# Patient Record
Sex: Female | Born: 1950 | Race: White | Hispanic: No | Smoking: Current every day smoker
Health system: Southern US, Community
[De-identification: ages and names within clinical notes are randomized; demographics above are authoritative.]

## PROBLEM LIST (undated history)

## (undated) DIAGNOSIS — I059 Rheumatic mitral valve disease, unspecified: Secondary | ICD-10-CM

## (undated) DIAGNOSIS — F329 Major depressive disorder, single episode, unspecified: Secondary | ICD-10-CM

## (undated) DIAGNOSIS — K2100 Gastro-esophageal reflux disease with esophagitis, without bleeding: Secondary | ICD-10-CM

## (undated) DIAGNOSIS — G894 Chronic pain syndrome: Secondary | ICD-10-CM

## (undated) DIAGNOSIS — I2510 Atherosclerotic heart disease of native coronary artery without angina pectoris: Secondary | ICD-10-CM

## (undated) DIAGNOSIS — F32A Depression, unspecified: Secondary | ICD-10-CM

## (undated) DIAGNOSIS — N181 Chronic kidney disease, stage 1: Secondary | ICD-10-CM

## (undated) DIAGNOSIS — M810 Age-related osteoporosis without current pathological fracture: Secondary | ICD-10-CM

## (undated) DIAGNOSIS — M5412 Radiculopathy, cervical region: Secondary | ICD-10-CM

## (undated) DIAGNOSIS — I509 Heart failure, unspecified: Secondary | ICD-10-CM

## (undated) DIAGNOSIS — M5030 Other cervical disc degeneration, unspecified cervical region: Secondary | ICD-10-CM

## (undated) DIAGNOSIS — L0291 Cutaneous abscess, unspecified: Secondary | ICD-10-CM

## (undated) DIAGNOSIS — I10 Essential (primary) hypertension: Secondary | ICD-10-CM

## (undated) DIAGNOSIS — IMO0002 Reserved for concepts with insufficient information to code with codable children: Secondary | ICD-10-CM

## (undated) DIAGNOSIS — G43909 Migraine, unspecified, not intractable, without status migrainosus: Secondary | ICD-10-CM

## (undated) DIAGNOSIS — E7800 Pure hypercholesterolemia, unspecified: Secondary | ICD-10-CM

## (undated) DIAGNOSIS — R32 Unspecified urinary incontinence: Secondary | ICD-10-CM

## (undated) DIAGNOSIS — L0390 Cellulitis, unspecified: Secondary | ICD-10-CM

## (undated) DIAGNOSIS — J301 Allergic rhinitis due to pollen: Secondary | ICD-10-CM

## (undated) DIAGNOSIS — F419 Anxiety disorder, unspecified: Secondary | ICD-10-CM

## (undated) HISTORY — PX: ABDOMINAL HYSTERECTOMY: SHX81

## (undated) HISTORY — DX: Cellulitis, unspecified: L03.90

## (undated) HISTORY — PX: BYPASS GRAFT: SHX909

## (undated) HISTORY — DX: Atherosclerotic heart disease of native coronary artery without angina pectoris: I25.10

## (undated) HISTORY — DX: Unspecified urinary incontinence: R32

## (undated) HISTORY — DX: Gastro-esophageal reflux disease with esophagitis, without bleeding: K21.00

## (undated) HISTORY — DX: Gastro-esophageal reflux disease with esophagitis: K21.0

## (undated) HISTORY — DX: Cutaneous abscess, unspecified: L02.91

## (undated) HISTORY — DX: Radiculopathy, cervical region: M54.12

## (undated) HISTORY — DX: Rheumatic mitral valve disease, unspecified: I05.9

## (undated) HISTORY — PX: BLADDER SURGERY: SHX569

## (undated) HISTORY — DX: Chronic kidney disease, stage 1: N18.1

## (undated) HISTORY — DX: Age-related osteoporosis without current pathological fracture: M81.0

## (undated) HISTORY — DX: Allergic rhinitis due to pollen: J30.1

## (undated) HISTORY — DX: Migraine, unspecified, not intractable, without status migrainosus: G43.909

## (undated) HISTORY — DX: Reserved for concepts with insufficient information to code with codable children: IMO0002

## (undated) HISTORY — PX: COLONOSCOPY: SHX174

## (undated) HISTORY — PX: CORONARY ARTERY BYPASS GRAFT: SHX141

## (undated) HISTORY — DX: Chronic pain syndrome: G89.4

## (undated) HISTORY — DX: Pure hypercholesterolemia, unspecified: E78.00

## (undated) HISTORY — DX: Essential (primary) hypertension: I10

## (undated) HISTORY — PX: OTHER SURGICAL HISTORY: SHX169

## (undated) HISTORY — DX: Heart failure, unspecified: I50.9

## (undated) HISTORY — DX: Other cervical disc degeneration, unspecified cervical region: M50.30

## (undated) HISTORY — PX: CHOLECYSTECTOMY: SHX55

---

## 2004-07-11 ENCOUNTER — Other Ambulatory Visit: Payer: Self-pay

## 2004-07-11 ENCOUNTER — Inpatient Hospital Stay: Payer: Self-pay | Admitting: Internal Medicine

## 2005-07-05 ENCOUNTER — Encounter: Payer: Self-pay | Admitting: Family Medicine

## 2005-08-01 ENCOUNTER — Ambulatory Visit: Payer: Self-pay | Admitting: Family Medicine

## 2006-10-30 ENCOUNTER — Emergency Department: Payer: Self-pay | Admitting: Unknown Physician Specialty

## 2007-05-22 DIAGNOSIS — I2510 Atherosclerotic heart disease of native coronary artery without angina pectoris: Secondary | ICD-10-CM | POA: Insufficient documentation

## 2007-09-20 ENCOUNTER — Emergency Department: Payer: Self-pay | Admitting: Emergency Medicine

## 2009-02-19 ENCOUNTER — Ambulatory Visit: Payer: Self-pay | Admitting: Family Medicine

## 2009-03-13 ENCOUNTER — Inpatient Hospital Stay: Payer: Self-pay | Admitting: Internal Medicine

## 2009-12-03 ENCOUNTER — Ambulatory Visit: Payer: Self-pay | Admitting: Pain Medicine

## 2010-09-22 ENCOUNTER — Inpatient Hospital Stay: Payer: Medicare Other | Admitting: Surgery

## 2010-10-11 ENCOUNTER — Emergency Department (HOSPITAL_COMMUNITY)
Admission: EM | Admit: 2010-10-11 | Discharge: 2010-10-12 | Payer: Self-pay | Source: Home / Self Care | Admitting: Emergency Medicine

## 2010-10-11 LAB — POCT I-STAT, CHEM 8
Calcium, Ion: 1.15 mmol/L (ref 1.12–1.32)
Creatinine, Ser: 1.4 mg/dL — ABNORMAL HIGH (ref 0.4–1.2)
Glucose, Bld: 102 mg/dL — ABNORMAL HIGH (ref 70–99)
Hemoglobin: 8.8 g/dL — ABNORMAL LOW (ref 12.0–15.0)
Sodium: 141 mEq/L (ref 135–145)
TCO2: 30 mmol/L (ref 0–100)

## 2010-10-11 LAB — DIFFERENTIAL
Basophils Relative: 0 % (ref 0–1)
Lymphs Abs: 2.6 10*3/uL (ref 0.7–4.0)
Monocytes Absolute: 1 10*3/uL (ref 0.1–1.0)
Monocytes Relative: 9 % (ref 3–12)
Neutro Abs: 6.8 10*3/uL (ref 1.7–7.7)

## 2010-10-11 LAB — CBC
Hemoglobin: 9 g/dL — ABNORMAL LOW (ref 12.0–15.0)
MCH: 32.6 pg (ref 26.0–34.0)
MCHC: 33.3 g/dL (ref 30.0–36.0)
MCV: 97.8 fL (ref 78.0–100.0)

## 2010-10-15 ENCOUNTER — Ambulatory Visit: Payer: Self-pay | Admitting: Geriatric Medicine

## 2010-10-16 ENCOUNTER — Other Ambulatory Visit: Payer: Self-pay | Admitting: Geriatric Medicine

## 2010-11-03 ENCOUNTER — Ambulatory Visit: Payer: Self-pay | Admitting: Surgery

## 2010-12-15 ENCOUNTER — Encounter: Payer: Self-pay | Admitting: Surgery

## 2011-01-11 ENCOUNTER — Encounter: Payer: Self-pay | Admitting: Surgery

## 2011-12-20 DIAGNOSIS — G894 Chronic pain syndrome: Secondary | ICD-10-CM | POA: Diagnosis not present

## 2011-12-20 DIAGNOSIS — I10 Essential (primary) hypertension: Secondary | ICD-10-CM | POA: Diagnosis not present

## 2011-12-20 DIAGNOSIS — E7800 Pure hypercholesterolemia, unspecified: Secondary | ICD-10-CM | POA: Diagnosis not present

## 2011-12-20 DIAGNOSIS — F329 Major depressive disorder, single episode, unspecified: Secondary | ICD-10-CM | POA: Diagnosis not present

## 2012-03-20 DIAGNOSIS — I10 Essential (primary) hypertension: Secondary | ICD-10-CM | POA: Diagnosis not present

## 2012-03-20 DIAGNOSIS — I2510 Atherosclerotic heart disease of native coronary artery without angina pectoris: Secondary | ICD-10-CM | POA: Diagnosis not present

## 2012-03-20 DIAGNOSIS — F329 Major depressive disorder, single episode, unspecified: Secondary | ICD-10-CM | POA: Diagnosis not present

## 2012-03-20 DIAGNOSIS — G894 Chronic pain syndrome: Secondary | ICD-10-CM | POA: Diagnosis not present

## 2012-05-21 DIAGNOSIS — G894 Chronic pain syndrome: Secondary | ICD-10-CM | POA: Diagnosis not present

## 2012-05-21 DIAGNOSIS — F329 Major depressive disorder, single episode, unspecified: Secondary | ICD-10-CM | POA: Diagnosis not present

## 2012-05-21 DIAGNOSIS — I509 Heart failure, unspecified: Secondary | ICD-10-CM | POA: Diagnosis not present

## 2012-05-21 DIAGNOSIS — Z23 Encounter for immunization: Secondary | ICD-10-CM | POA: Diagnosis not present

## 2012-05-22 ENCOUNTER — Inpatient Hospital Stay: Payer: Self-pay | Admitting: Orthopedic Surgery

## 2012-05-22 DIAGNOSIS — Z01818 Encounter for other preprocedural examination: Secondary | ICD-10-CM | POA: Diagnosis not present

## 2012-05-22 DIAGNOSIS — I428 Other cardiomyopathies: Secondary | ICD-10-CM | POA: Diagnosis not present

## 2012-05-22 DIAGNOSIS — M542 Cervicalgia: Secondary | ICD-10-CM | POA: Diagnosis not present

## 2012-05-22 DIAGNOSIS — M25569 Pain in unspecified knee: Secondary | ICD-10-CM | POA: Diagnosis not present

## 2012-05-22 DIAGNOSIS — I2510 Atherosclerotic heart disease of native coronary artery without angina pectoris: Secondary | ICD-10-CM | POA: Diagnosis not present

## 2012-05-22 DIAGNOSIS — S139XXA Sprain of joints and ligaments of unspecified parts of neck, initial encounter: Secondary | ICD-10-CM | POA: Diagnosis not present

## 2012-05-22 DIAGNOSIS — I10 Essential (primary) hypertension: Secondary | ICD-10-CM | POA: Diagnosis not present

## 2012-05-22 DIAGNOSIS — S060X0A Concussion without loss of consciousness, initial encounter: Secondary | ICD-10-CM | POA: Diagnosis not present

## 2012-05-22 DIAGNOSIS — M545 Low back pain: Secondary | ICD-10-CM | POA: Diagnosis not present

## 2012-05-22 DIAGNOSIS — S199XXA Unspecified injury of neck, initial encounter: Secondary | ICD-10-CM | POA: Diagnosis not present

## 2012-05-22 DIAGNOSIS — Z0389 Encounter for observation for other suspected diseases and conditions ruled out: Secondary | ICD-10-CM | POA: Diagnosis not present

## 2012-05-22 DIAGNOSIS — S72033A Displaced midcervical fracture of unspecified femur, initial encounter for closed fracture: Secondary | ICD-10-CM | POA: Diagnosis not present

## 2012-05-22 DIAGNOSIS — S72009A Fracture of unspecified part of neck of unspecified femur, initial encounter for closed fracture: Secondary | ICD-10-CM | POA: Diagnosis not present

## 2012-05-22 LAB — COMPREHENSIVE METABOLIC PANEL
Alkaline Phosphatase: 113 U/L (ref 50–136)
Anion Gap: 10 (ref 7–16)
Calcium, Total: 9.1 mg/dL (ref 8.5–10.1)
Co2: 26 mmol/L (ref 21–32)
Creatinine: 0.93 mg/dL (ref 0.60–1.30)
EGFR (African American): >60
Osmolality: 277 (ref 275–301)
SGOT(AST): 38 U/L — ABNORMAL HIGH (ref 15–37)
SGPT (ALT): 18 U/L (ref 12–78)

## 2012-05-22 LAB — URINALYSIS, COMPLETE
Bilirubin,UR: NEGATIVE
Glucose,UR: NEGATIVE mg/dL (ref 0–75)
Ph: 6 (ref 4.5–8.0)
Protein: NEGATIVE
RBC,UR: 3 /HPF (ref 0–5)
Squamous Epithelial: 1

## 2012-05-22 LAB — CBC
HGB: 13 g/dL (ref 12.0–16.0)
MCH: 33.4 pg (ref 26.0–34.0)
Platelet: 177 10*3/uL (ref 150–440)
RBC: 3.9 10*6/uL (ref 3.80–5.20)

## 2012-05-22 LAB — APTT: Activated PTT: 31.5 secs (ref 23.6–35.9)

## 2012-05-22 LAB — PROTIME-INR
INR: 0.9
Prothrombin Time: 12.8 secs (ref 11.5–14.7)

## 2012-05-23 DIAGNOSIS — I428 Other cardiomyopathies: Secondary | ICD-10-CM | POA: Diagnosis not present

## 2012-05-23 DIAGNOSIS — S72023A Displaced fracture of epiphysis (separation) (upper) of unspecified femur, initial encounter for closed fracture: Secondary | ICD-10-CM | POA: Diagnosis not present

## 2012-05-23 DIAGNOSIS — S72033A Displaced midcervical fracture of unspecified femur, initial encounter for closed fracture: Secondary | ICD-10-CM | POA: Diagnosis not present

## 2012-05-23 DIAGNOSIS — I2510 Atherosclerotic heart disease of native coronary artery without angina pectoris: Secondary | ICD-10-CM | POA: Diagnosis not present

## 2012-05-23 DIAGNOSIS — I10 Essential (primary) hypertension: Secondary | ICD-10-CM | POA: Diagnosis not present

## 2012-05-23 DIAGNOSIS — S72009A Fracture of unspecified part of neck of unspecified femur, initial encounter for closed fracture: Secondary | ICD-10-CM | POA: Diagnosis not present

## 2012-05-23 DIAGNOSIS — M84453A Pathological fracture, unspecified femur, initial encounter for fracture: Secondary | ICD-10-CM | POA: Diagnosis not present

## 2012-05-24 DIAGNOSIS — M84453A Pathological fracture, unspecified femur, initial encounter for fracture: Secondary | ICD-10-CM | POA: Diagnosis not present

## 2012-05-24 DIAGNOSIS — I10 Essential (primary) hypertension: Secondary | ICD-10-CM | POA: Diagnosis not present

## 2012-05-24 DIAGNOSIS — I2510 Atherosclerotic heart disease of native coronary artery without angina pectoris: Secondary | ICD-10-CM | POA: Diagnosis not present

## 2012-05-24 DIAGNOSIS — I428 Other cardiomyopathies: Secondary | ICD-10-CM | POA: Diagnosis not present

## 2012-05-25 DIAGNOSIS — M84453A Pathological fracture, unspecified femur, initial encounter for fracture: Secondary | ICD-10-CM | POA: Diagnosis not present

## 2012-05-25 DIAGNOSIS — I428 Other cardiomyopathies: Secondary | ICD-10-CM | POA: Diagnosis not present

## 2012-05-25 DIAGNOSIS — I2510 Atherosclerotic heart disease of native coronary artery without angina pectoris: Secondary | ICD-10-CM | POA: Diagnosis not present

## 2012-05-25 DIAGNOSIS — I10 Essential (primary) hypertension: Secondary | ICD-10-CM | POA: Diagnosis not present

## 2012-05-25 LAB — URINALYSIS, COMPLETE
Nitrite: NEGATIVE
Ph: 7 (ref 4.5–8.0)
Protein: NEGATIVE
Specific Gravity: 1.002 (ref 1.003–1.030)
WBC UR: 3 /HPF (ref 0–5)

## 2012-05-26 LAB — CREATININE, SERUM
Creatinine: 0.85 mg/dL (ref 0.60–1.30)
EGFR (African American): >60
EGFR (Non-African Amer.): >60

## 2012-05-26 LAB — URINE CULTURE

## 2012-05-28 DIAGNOSIS — I2510 Atherosclerotic heart disease of native coronary artery without angina pectoris: Secondary | ICD-10-CM | POA: Diagnosis not present

## 2012-05-28 DIAGNOSIS — IMO0001 Reserved for inherently not codable concepts without codable children: Secondary | ICD-10-CM | POA: Diagnosis not present

## 2012-05-28 DIAGNOSIS — M6281 Muscle weakness (generalized): Secondary | ICD-10-CM | POA: Diagnosis not present

## 2012-05-28 DIAGNOSIS — I10 Essential (primary) hypertension: Secondary | ICD-10-CM | POA: Diagnosis not present

## 2012-05-28 DIAGNOSIS — S7290XD Unspecified fracture of unspecified femur, subsequent encounter for closed fracture with routine healing: Secondary | ICD-10-CM | POA: Diagnosis not present

## 2012-05-28 DIAGNOSIS — F329 Major depressive disorder, single episode, unspecified: Secondary | ICD-10-CM | POA: Diagnosis not present

## 2012-05-29 DIAGNOSIS — I2510 Atherosclerotic heart disease of native coronary artery without angina pectoris: Secondary | ICD-10-CM | POA: Diagnosis not present

## 2012-05-29 DIAGNOSIS — I10 Essential (primary) hypertension: Secondary | ICD-10-CM | POA: Diagnosis not present

## 2012-05-29 DIAGNOSIS — F329 Major depressive disorder, single episode, unspecified: Secondary | ICD-10-CM | POA: Diagnosis not present

## 2012-05-29 DIAGNOSIS — M6281 Muscle weakness (generalized): Secondary | ICD-10-CM | POA: Diagnosis not present

## 2012-05-29 DIAGNOSIS — S7290XD Unspecified fracture of unspecified femur, subsequent encounter for closed fracture with routine healing: Secondary | ICD-10-CM | POA: Diagnosis not present

## 2012-05-29 DIAGNOSIS — IMO0001 Reserved for inherently not codable concepts without codable children: Secondary | ICD-10-CM | POA: Diagnosis not present

## 2012-06-01 DIAGNOSIS — S7290XD Unspecified fracture of unspecified femur, subsequent encounter for closed fracture with routine healing: Secondary | ICD-10-CM | POA: Diagnosis not present

## 2012-06-01 DIAGNOSIS — IMO0001 Reserved for inherently not codable concepts without codable children: Secondary | ICD-10-CM | POA: Diagnosis not present

## 2012-06-01 DIAGNOSIS — I2510 Atherosclerotic heart disease of native coronary artery without angina pectoris: Secondary | ICD-10-CM | POA: Diagnosis not present

## 2012-06-01 DIAGNOSIS — I10 Essential (primary) hypertension: Secondary | ICD-10-CM | POA: Diagnosis not present

## 2012-06-01 DIAGNOSIS — F329 Major depressive disorder, single episode, unspecified: Secondary | ICD-10-CM | POA: Diagnosis not present

## 2012-06-01 DIAGNOSIS — M6281 Muscle weakness (generalized): Secondary | ICD-10-CM | POA: Diagnosis not present

## 2012-06-04 DIAGNOSIS — M6281 Muscle weakness (generalized): Secondary | ICD-10-CM | POA: Diagnosis not present

## 2012-06-04 DIAGNOSIS — I10 Essential (primary) hypertension: Secondary | ICD-10-CM | POA: Diagnosis not present

## 2012-06-04 DIAGNOSIS — IMO0001 Reserved for inherently not codable concepts without codable children: Secondary | ICD-10-CM | POA: Diagnosis not present

## 2012-06-04 DIAGNOSIS — F329 Major depressive disorder, single episode, unspecified: Secondary | ICD-10-CM | POA: Diagnosis not present

## 2012-06-04 DIAGNOSIS — I2510 Atherosclerotic heart disease of native coronary artery without angina pectoris: Secondary | ICD-10-CM | POA: Diagnosis not present

## 2012-06-04 DIAGNOSIS — S7290XD Unspecified fracture of unspecified femur, subsequent encounter for closed fracture with routine healing: Secondary | ICD-10-CM | POA: Diagnosis not present

## 2012-06-06 DIAGNOSIS — I10 Essential (primary) hypertension: Secondary | ICD-10-CM | POA: Diagnosis not present

## 2012-06-06 DIAGNOSIS — M6281 Muscle weakness (generalized): Secondary | ICD-10-CM | POA: Diagnosis not present

## 2012-06-06 DIAGNOSIS — F329 Major depressive disorder, single episode, unspecified: Secondary | ICD-10-CM | POA: Diagnosis not present

## 2012-06-06 DIAGNOSIS — I2510 Atherosclerotic heart disease of native coronary artery without angina pectoris: Secondary | ICD-10-CM | POA: Diagnosis not present

## 2012-06-06 DIAGNOSIS — IMO0001 Reserved for inherently not codable concepts without codable children: Secondary | ICD-10-CM | POA: Diagnosis not present

## 2012-06-06 DIAGNOSIS — S7290XD Unspecified fracture of unspecified femur, subsequent encounter for closed fracture with routine healing: Secondary | ICD-10-CM | POA: Diagnosis not present

## 2012-06-08 DIAGNOSIS — S7290XD Unspecified fracture of unspecified femur, subsequent encounter for closed fracture with routine healing: Secondary | ICD-10-CM | POA: Diagnosis not present

## 2012-06-08 DIAGNOSIS — I10 Essential (primary) hypertension: Secondary | ICD-10-CM | POA: Diagnosis not present

## 2012-06-08 DIAGNOSIS — I2510 Atherosclerotic heart disease of native coronary artery without angina pectoris: Secondary | ICD-10-CM | POA: Diagnosis not present

## 2012-06-08 DIAGNOSIS — IMO0001 Reserved for inherently not codable concepts without codable children: Secondary | ICD-10-CM | POA: Diagnosis not present

## 2012-06-08 DIAGNOSIS — F329 Major depressive disorder, single episode, unspecified: Secondary | ICD-10-CM | POA: Diagnosis not present

## 2012-06-08 DIAGNOSIS — M6281 Muscle weakness (generalized): Secondary | ICD-10-CM | POA: Diagnosis not present

## 2012-06-09 DIAGNOSIS — F329 Major depressive disorder, single episode, unspecified: Secondary | ICD-10-CM | POA: Diagnosis not present

## 2012-06-09 DIAGNOSIS — I10 Essential (primary) hypertension: Secondary | ICD-10-CM | POA: Diagnosis not present

## 2012-06-09 DIAGNOSIS — IMO0001 Reserved for inherently not codable concepts without codable children: Secondary | ICD-10-CM | POA: Diagnosis not present

## 2012-06-09 DIAGNOSIS — S7290XD Unspecified fracture of unspecified femur, subsequent encounter for closed fracture with routine healing: Secondary | ICD-10-CM | POA: Diagnosis not present

## 2012-06-09 DIAGNOSIS — I2510 Atherosclerotic heart disease of native coronary artery without angina pectoris: Secondary | ICD-10-CM | POA: Diagnosis not present

## 2012-06-09 DIAGNOSIS — M6281 Muscle weakness (generalized): Secondary | ICD-10-CM | POA: Diagnosis not present

## 2012-06-11 DIAGNOSIS — I2510 Atherosclerotic heart disease of native coronary artery without angina pectoris: Secondary | ICD-10-CM | POA: Diagnosis not present

## 2012-06-11 DIAGNOSIS — S7290XD Unspecified fracture of unspecified femur, subsequent encounter for closed fracture with routine healing: Secondary | ICD-10-CM | POA: Diagnosis not present

## 2012-06-11 DIAGNOSIS — IMO0001 Reserved for inherently not codable concepts without codable children: Secondary | ICD-10-CM | POA: Diagnosis not present

## 2012-06-11 DIAGNOSIS — I10 Essential (primary) hypertension: Secondary | ICD-10-CM | POA: Diagnosis not present

## 2012-06-11 DIAGNOSIS — M6281 Muscle weakness (generalized): Secondary | ICD-10-CM | POA: Diagnosis not present

## 2012-06-11 DIAGNOSIS — F329 Major depressive disorder, single episode, unspecified: Secondary | ICD-10-CM | POA: Diagnosis not present

## 2012-06-18 DIAGNOSIS — I2510 Atherosclerotic heart disease of native coronary artery without angina pectoris: Secondary | ICD-10-CM | POA: Diagnosis not present

## 2012-06-18 DIAGNOSIS — F329 Major depressive disorder, single episode, unspecified: Secondary | ICD-10-CM | POA: Diagnosis not present

## 2012-06-18 DIAGNOSIS — I10 Essential (primary) hypertension: Secondary | ICD-10-CM | POA: Diagnosis not present

## 2012-06-18 DIAGNOSIS — S7290XD Unspecified fracture of unspecified femur, subsequent encounter for closed fracture with routine healing: Secondary | ICD-10-CM | POA: Diagnosis not present

## 2012-06-18 DIAGNOSIS — M6281 Muscle weakness (generalized): Secondary | ICD-10-CM | POA: Diagnosis not present

## 2012-06-18 DIAGNOSIS — IMO0001 Reserved for inherently not codable concepts without codable children: Secondary | ICD-10-CM | POA: Diagnosis not present

## 2012-07-18 DIAGNOSIS — I10 Essential (primary) hypertension: Secondary | ICD-10-CM | POA: Diagnosis not present

## 2012-07-18 DIAGNOSIS — IMO0001 Reserved for inherently not codable concepts without codable children: Secondary | ICD-10-CM | POA: Diagnosis not present

## 2012-07-18 DIAGNOSIS — I2510 Atherosclerotic heart disease of native coronary artery without angina pectoris: Secondary | ICD-10-CM | POA: Diagnosis not present

## 2012-07-18 DIAGNOSIS — S7290XD Unspecified fracture of unspecified femur, subsequent encounter for closed fracture with routine healing: Secondary | ICD-10-CM | POA: Diagnosis not present

## 2012-07-20 DIAGNOSIS — Z23 Encounter for immunization: Secondary | ICD-10-CM | POA: Diagnosis not present

## 2012-07-20 DIAGNOSIS — G894 Chronic pain syndrome: Secondary | ICD-10-CM | POA: Diagnosis not present

## 2012-07-20 DIAGNOSIS — E7800 Pure hypercholesterolemia, unspecified: Secondary | ICD-10-CM | POA: Diagnosis not present

## 2012-07-20 DIAGNOSIS — F329 Major depressive disorder, single episode, unspecified: Secondary | ICD-10-CM | POA: Diagnosis not present

## 2012-08-16 DIAGNOSIS — E7800 Pure hypercholesterolemia, unspecified: Secondary | ICD-10-CM | POA: Diagnosis not present

## 2012-09-12 HISTORY — PX: CARDIAC CATHETERIZATION: SHX172

## 2012-09-20 DIAGNOSIS — I10 Essential (primary) hypertension: Secondary | ICD-10-CM | POA: Diagnosis not present

## 2012-09-20 DIAGNOSIS — Z1239 Encounter for other screening for malignant neoplasm of breast: Secondary | ICD-10-CM | POA: Diagnosis not present

## 2012-09-20 DIAGNOSIS — G894 Chronic pain syndrome: Secondary | ICD-10-CM | POA: Diagnosis not present

## 2012-09-20 DIAGNOSIS — I509 Heart failure, unspecified: Secondary | ICD-10-CM | POA: Diagnosis not present

## 2012-12-14 DIAGNOSIS — R6889 Other general symptoms and signs: Secondary | ICD-10-CM | POA: Diagnosis not present

## 2012-12-15 ENCOUNTER — Inpatient Hospital Stay: Payer: Self-pay | Admitting: Internal Medicine

## 2012-12-15 DIAGNOSIS — D72829 Elevated white blood cell count, unspecified: Secondary | ICD-10-CM | POA: Diagnosis present

## 2012-12-15 DIAGNOSIS — I10 Essential (primary) hypertension: Secondary | ICD-10-CM | POA: Diagnosis not present

## 2012-12-15 DIAGNOSIS — J189 Pneumonia, unspecified organism: Secondary | ICD-10-CM | POA: Diagnosis not present

## 2012-12-15 DIAGNOSIS — K219 Gastro-esophageal reflux disease without esophagitis: Secondary | ICD-10-CM | POA: Diagnosis present

## 2012-12-15 DIAGNOSIS — R079 Chest pain, unspecified: Secondary | ICD-10-CM | POA: Diagnosis not present

## 2012-12-15 DIAGNOSIS — I2510 Atherosclerotic heart disease of native coronary artery without angina pectoris: Secondary | ICD-10-CM | POA: Diagnosis not present

## 2012-12-15 DIAGNOSIS — G43909 Migraine, unspecified, not intractable, without status migrainosus: Secondary | ICD-10-CM | POA: Diagnosis present

## 2012-12-15 DIAGNOSIS — F17200 Nicotine dependence, unspecified, uncomplicated: Secondary | ICD-10-CM | POA: Diagnosis present

## 2012-12-15 DIAGNOSIS — R011 Cardiac murmur, unspecified: Secondary | ICD-10-CM | POA: Diagnosis present

## 2012-12-15 DIAGNOSIS — R609 Edema, unspecified: Secondary | ICD-10-CM | POA: Diagnosis present

## 2012-12-15 DIAGNOSIS — Z7982 Long term (current) use of aspirin: Secondary | ICD-10-CM | POA: Diagnosis not present

## 2012-12-15 DIAGNOSIS — R918 Other nonspecific abnormal finding of lung field: Secondary | ICD-10-CM | POA: Diagnosis not present

## 2012-12-15 DIAGNOSIS — E876 Hypokalemia: Secondary | ICD-10-CM | POA: Diagnosis not present

## 2012-12-15 DIAGNOSIS — Z951 Presence of aortocoronary bypass graft: Secondary | ICD-10-CM | POA: Diagnosis not present

## 2012-12-15 DIAGNOSIS — F329 Major depressive disorder, single episode, unspecified: Secondary | ICD-10-CM | POA: Diagnosis present

## 2012-12-15 DIAGNOSIS — I428 Other cardiomyopathies: Secondary | ICD-10-CM | POA: Diagnosis present

## 2012-12-15 DIAGNOSIS — R0602 Shortness of breath: Secondary | ICD-10-CM | POA: Diagnosis not present

## 2012-12-15 LAB — CBC WITH DIFFERENTIAL/PLATELET
HCT: 35.5 % (ref 35.0–47.0)
HGB: 12 g/dL (ref 12.0–16.0)
Lymphocyte #: 1.4 10*3/uL (ref 1.0–3.6)
Lymphocyte %: 7.9 %
MCH: 32.2 pg (ref 26.0–34.0)
Monocyte #: 0.9 x10 3/mm (ref 0.2–0.9)
Monocyte %: 5.3 %
Neutrophil #: 15 10*3/uL — ABNORMAL HIGH (ref 1.4–6.5)
Platelet: 187 10*3/uL (ref 150–440)
RBC: 3.74 10*6/uL — ABNORMAL LOW (ref 3.80–5.20)
RDW: 14.3 % (ref 11.5–14.5)
WBC: 17.4 10*3/uL — ABNORMAL HIGH (ref 3.6–11.0)

## 2012-12-15 LAB — TSH: Thyroid Stimulating Horm: 0.842 u[IU]/mL

## 2012-12-15 LAB — COMPREHENSIVE METABOLIC PANEL
Alkaline Phosphatase: 103 U/L (ref 50–136)
Anion Gap: 7 (ref 7–16)
BUN: 9 mg/dL (ref 7–18)
Calcium, Total: 8.5 mg/dL (ref 8.5–10.1)
Co2: 28 mmol/L (ref 21–32)
Creatinine: 0.99 mg/dL (ref 0.60–1.30)
EGFR (African American): >60
EGFR (Non-African Amer.): >60
Glucose: 112 mg/dL — ABNORMAL HIGH (ref 65–99)
Osmolality: 277 (ref 275–301)
Potassium: 2.9 mmol/L — ABNORMAL LOW (ref 3.5–5.1)
SGPT (ALT): 21 U/L (ref 12–78)
Sodium: 139 mmol/L (ref 136–145)
Total Protein: 7.7 g/dL (ref 6.4–8.2)

## 2012-12-15 LAB — PRO B NATRIURETIC PEPTIDE: B-Type Natriuretic Peptide: 403 pg/mL — ABNORMAL HIGH (ref 0–125)

## 2012-12-15 LAB — TROPONIN I: Troponin-I: 0.03 ng/mL

## 2012-12-15 LAB — WBC: WBC: 14.2 10*3/uL — ABNORMAL HIGH (ref 3.6–11.0)

## 2012-12-17 DIAGNOSIS — J189 Pneumonia, unspecified organism: Secondary | ICD-10-CM | POA: Diagnosis not present

## 2012-12-17 DIAGNOSIS — M5030 Other cervical disc degeneration, unspecified cervical region: Secondary | ICD-10-CM | POA: Diagnosis not present

## 2012-12-20 LAB — CULTURE, BLOOD (SINGLE)

## 2013-01-04 ENCOUNTER — Ambulatory Visit: Payer: Self-pay | Admitting: Family Medicine

## 2013-01-04 DIAGNOSIS — J189 Pneumonia, unspecified organism: Secondary | ICD-10-CM | POA: Diagnosis not present

## 2013-02-14 DIAGNOSIS — B0223 Postherpetic polyneuropathy: Secondary | ICD-10-CM | POA: Diagnosis not present

## 2013-02-14 DIAGNOSIS — G894 Chronic pain syndrome: Secondary | ICD-10-CM | POA: Diagnosis not present

## 2013-04-15 DIAGNOSIS — E7800 Pure hypercholesterolemia, unspecified: Secondary | ICD-10-CM | POA: Diagnosis not present

## 2013-04-15 DIAGNOSIS — F329 Major depressive disorder, single episode, unspecified: Secondary | ICD-10-CM | POA: Diagnosis not present

## 2013-04-15 DIAGNOSIS — G8929 Other chronic pain: Secondary | ICD-10-CM | POA: Diagnosis not present

## 2013-04-15 DIAGNOSIS — I10 Essential (primary) hypertension: Secondary | ICD-10-CM | POA: Diagnosis not present

## 2013-05-01 DIAGNOSIS — E7800 Pure hypercholesterolemia, unspecified: Secondary | ICD-10-CM | POA: Diagnosis not present

## 2013-05-01 DIAGNOSIS — I10 Essential (primary) hypertension: Secondary | ICD-10-CM | POA: Diagnosis not present

## 2013-06-11 DIAGNOSIS — I509 Heart failure, unspecified: Secondary | ICD-10-CM | POA: Diagnosis not present

## 2013-06-11 DIAGNOSIS — Z0000 Encounter for general adult medical examination without abnormal findings: Secondary | ICD-10-CM | POA: Diagnosis not present

## 2013-06-11 DIAGNOSIS — Z23 Encounter for immunization: Secondary | ICD-10-CM | POA: Diagnosis not present

## 2013-06-11 DIAGNOSIS — G894 Chronic pain syndrome: Secondary | ICD-10-CM | POA: Diagnosis not present

## 2013-06-11 DIAGNOSIS — F329 Major depressive disorder, single episode, unspecified: Secondary | ICD-10-CM | POA: Diagnosis not present

## 2013-06-14 ENCOUNTER — Encounter: Payer: Self-pay | Admitting: *Deleted

## 2013-06-17 ENCOUNTER — Ambulatory Visit: Payer: Self-pay | Admitting: Cardiovascular Disease

## 2013-06-21 ENCOUNTER — Ambulatory Visit (INDEPENDENT_AMBULATORY_CARE_PROVIDER_SITE_OTHER): Payer: Medicare Other | Admitting: Cardiovascular Disease

## 2013-06-21 ENCOUNTER — Encounter: Payer: Self-pay | Admitting: Cardiovascular Disease

## 2013-06-21 VITALS — BP 158/92 | HR 55 | Ht 60.0 in | Wt 133.8 lb

## 2013-06-21 DIAGNOSIS — R609 Edema, unspecified: Secondary | ICD-10-CM

## 2013-06-21 DIAGNOSIS — R0789 Other chest pain: Secondary | ICD-10-CM | POA: Insufficient documentation

## 2013-06-21 DIAGNOSIS — R600 Localized edema: Secondary | ICD-10-CM | POA: Insufficient documentation

## 2013-06-21 DIAGNOSIS — R0602 Shortness of breath: Secondary | ICD-10-CM | POA: Insufficient documentation

## 2013-06-21 DIAGNOSIS — R079 Chest pain, unspecified: Secondary | ICD-10-CM

## 2013-06-21 DIAGNOSIS — I25810 Atherosclerosis of coronary artery bypass graft(s) without angina pectoris: Secondary | ICD-10-CM | POA: Diagnosis not present

## 2013-06-21 DIAGNOSIS — E785 Hyperlipidemia, unspecified: Secondary | ICD-10-CM | POA: Insufficient documentation

## 2013-06-21 NOTE — Assessment & Plan Note (Signed)
Chest discomfort concerning for underlying ischemia. History of CAD and bypass in 2006. She is unable to treadmill given joint pain. She reports trying to treadmill in the past and on in related for several minutes. We'll order a pharmacologic Myoview to rule out ischemia.

## 2013-06-21 NOTE — Progress Notes (Signed)
Patient ID: Erica Stevens, female    DOB: [birth year], 62 y.o.   MRN: [id number]  HPI Comments: Erica Stevens is a 62 year old woman with long prior smoking history from age [age] one pack per day he stopped in 2006 at the time of her chest pain and discovery of underlying coronary artery disease, with bypass surgery x3 at [facility] in 2006, by her report with followup catheterization shortly after her chest discomfort at which time she was found to have mild problems with her vein grafts (by her report), history of hyperlipidemia, hypertension with ejection fraction greater than 55% and 2012 who presents with several months of chest pain, shortness of breath, edema. She's a patient of Dr. Charlette Caffey. Notes indicate history of depression, chronic pain  She states that she was doing well until over the summer at which time she started developing worsening leg edema. She has been taking Lasix sometimes daily, sometimes every other day for symptoms. Edema is worse than it typically is. She does not drink excessively. She's not been on her feet more. She denies having any abdominal swelling or cough. She does have shortness of breath when she walks. Also has periods of chest pain with exertion. Swelling seems to get worse at the end of the day. She does wake up with leg edema.  Prior echocardiogram January 2012 was essentially normal with ejection fraction greater than 55%, normal right ventricular systolic pressure Carotid ultrasound October 2005 showing no hemodynamically significant disease  EKG today shows sinus bradycardia with rate 55 beats per minute, low voltage through the anterior precordial leads  Recent lab work shows total cholesterol 174, creatinine 0.89     Outpatient Encounter Prescriptions as of 06/21/2013  Medication Sig Dispense Refill  . aspirin 81 MG tablet Take 81 mg by mouth daily.      . enalapril (VASOTEC) 5 MG tablet Take 5 mg by mouth 2 (two) times daily.      .  furosemide (LASIX) 40 MG tablet Takes 1/2 to 1 tablet daily.      . isosorbide dinitrate (ISORDIL) 30 MG tablet Take 30 mg by mouth daily.      . metoprolol succinate (TOPROL-XL) 25 MG 24 hr tablet Take 25 mg by mouth daily.      . montelukast (SINGULAIR) 10 MG tablet Take 10 mg by mouth at bedtime.      . nitroGLYCERIN (NITROLINGUAL) 0.4 MG/SPRAY spray Place 1 spray under the tongue every 5 (five) minutes as needed for chest pain.      Marland Kitchen omega-3 acid ethyl esters (LOVAZA) 1 G capsule Take 2 g by mouth 2 (two) times daily.      . OxyCODONE (OXYCONTIN) 20 mg T12A 12 hr tablet Take 20 mg by mouth every 8 (eight) hours.      . potassium chloride SA (K-DUR,KLOR-CON) 20 MEQ tablet Take 20 mEq by mouth daily.      . pravastatin (PRAVACHOL) 40 MG tablet Take 40 mg by mouth 2 (two) times daily.      Marland Kitchen tiZANidine (ZANAFLEX) 4 MG tablet Take 4 mg by mouth 2 (two) times daily.      Marland Kitchen tolterodine (DETROL) 2 MG tablet Take 2 mg by mouth 2 (two) times daily.      Marland Kitchen venlafaxine (EFFEXOR) 75 MG tablet Takes 1 tablet am, 2 tablets in the evening daily.         Review of Systems  Constitutional: Negative.   HENT: Negative.   Eyes: Negative.  Respiratory: Positive for chest tightness and shortness of breath.   Cardiovascular: Positive for chest pain and leg swelling.  Gastrointestinal: Negative.   Endocrine: Negative.   Musculoskeletal: Negative.   Skin: Negative.   Allergic/Immunologic: Negative.   Neurological: Negative.   Hematological: Negative.   Psychiatric/Behavioral: Negative.   All other systems reviewed and are negative.    BP 158/92  Pulse 55  Ht 5' (1.524 m)  Wt 133 lb 12 oz (60.669 kg)  BMI 26.12 kg/m2  Physical Exam  Nursing note and vitals reviewed. Constitutional: She is oriented to person, place, and time. She appears well-developed and well-nourished.  HENT:  Head: Normocephalic.  Nose: Nose normal.  Mouth/Throat: Oropharynx is clear and moist.  Eyes: Conjunctivae are  normal. Pupils are equal, round, and reactive to light.  Neck: Normal range of motion. Neck supple. No JVD present.  Cardiovascular: Normal rate, regular rhythm, S1 normal, S2 normal, normal heart sounds and intact distal pulses.  Exam reveals no gallop and no friction rub.   No murmur heard. Pulmonary/Chest: Effort normal and breath sounds normal. No respiratory distress. She has no wheezes. She has no rales. She exhibits no tenderness.  Abdominal: Soft. Bowel sounds are normal. She exhibits no distension. There is no tenderness.  Musculoskeletal: Normal range of motion. She exhibits no edema and no tenderness.  Lymphadenopathy:    She has no cervical adenopathy.  Neurological: She is alert and oriented to person, place, and time. Coordination normal.  Skin: Skin is warm and dry. No rash noted. No erythema.  Psychiatric: She has a normal mood and affect. Her behavior is normal. Judgment and thought content normal.    Assessment and Plan

## 2013-06-21 NOTE — Assessment & Plan Note (Signed)
Etiology of her shortness of breath is unclear. Stress test ordered for ischemia workup. We'll order echocardiogram to exclude elevated right ventricular systolic pressures and any change to her LV function. Encouraged her to continue to use Lasix for breathing problems or edema

## 2013-06-21 NOTE — Assessment & Plan Note (Signed)
Encouraged her to stay on her statin. Goal LDL less than 70. Cholesterol is slightly above goal at 174. Goal less than 150. She may benefit from simvastatin for Lipitor.

## 2013-06-21 NOTE — Assessment & Plan Note (Signed)
Stress test scheduled as above.

## 2013-06-21 NOTE — Patient Instructions (Addendum)
You are doing well. No medication changes were made.  We will schedule you for a lexiscan myoview   We will also schedule you for an echocardiogram for leg edema, shortness of breath  Please call us if you have new issues that need to be addressed before your next appt.  Your physician wants you to follow-up in: 1 month.   [facility] MYOVIEW  Your caregiver has ordered a Stress Test with nuclear imaging. The purpose of this test is to evaluate the blood supply to your heart muscle. This procedure is referred to as a "Non-Invasive Stress Test." This is because other than having an IV started in your vein, nothing is inserted or "invades" your body. Cardiac stress tests are done to find areas of poor blood flow to the heart by determining the extent of coronary artery disease (CAD). Some patients exercise on a treadmill, which naturally increases the blood flow to your heart, while others who are  unable to walk on a treadmill due to physical limitations have a pharmacologic/chemical stress agent called Lexiscan . This medicine will mimic walking on a treadmill by temporarily increasing your coronary blood flow.   Please note: these test may take anywhere between 2-4 hours to complete  PLEASE REPORT TO [facility] MEDICAL MALL ENTRANCE  THE VOLUNTEERS AT THE FIRST DESK WILL DIRECT YOU WHERE TO GO  Date of Procedure:______THURS, OCT 16_____________________  Arrival Time for Procedure:___________7:30___________________  Instructions regarding medication:   HOLD ISOSORBIDE & METOPROLOL THE NIGHT BEFORE AND MORNING OF PROCEDURE   PLEASE NOTIFY THE OFFICE AT LEAST 24 HOURS IN ADVANCE IF YOU ARE UNABLE TO KEEP YOUR APPOINTMENT.  [phone] AND  PLEASE NOTIFY NUCLEAR MEDICINE AT [facility] AT LEAST 24 HOURS IN ADVANCE IF YOU ARE UNABLE TO KEEP YOUR APPOINTMENT. [phone]  How to prepare for your Myoview test:  1. Do not eat or drink after midnight 2. No caffeine for 24 hours prior to test 3. No smoking  24 hours prior to test. 4. Your medication may be taken with water.  If your doctor stopped a medication because of this test, do not take that medication. 5. Ladies, please do not wear dresses.  Skirts or pants are appropriate. Please wear a short sleeve shirt. 6. No perfume, cologne or lotion. 7. Wear comfortable walking shoes. No heels!

## 2013-06-27 ENCOUNTER — Ambulatory Visit: Payer: Self-pay | Admitting: Cardiovascular Disease

## 2013-06-27 ENCOUNTER — Other Ambulatory Visit: Payer: Self-pay

## 2013-06-27 DIAGNOSIS — R0602 Shortness of breath: Secondary | ICD-10-CM

## 2013-06-27 DIAGNOSIS — R600 Localized edema: Secondary | ICD-10-CM

## 2013-06-27 DIAGNOSIS — R0789 Other chest pain: Secondary | ICD-10-CM

## 2013-06-27 DIAGNOSIS — R079 Chest pain, unspecified: Secondary | ICD-10-CM | POA: Diagnosis not present

## 2013-07-01 ENCOUNTER — Telehealth: Payer: Self-pay

## 2013-07-01 NOTE — Telephone Encounter (Signed)
lmom to call back 

## 2013-07-01 NOTE — Telephone Encounter (Signed)
Message copied by Marilynne Halsted on Mon Jul 01, 2013  1:58 PM ------      Message from: Antonieta Iba      Created: Sun Jun 30, 2013  6:52 PM       Stress test shows region of old blockage and possible area of ischemia (partial blockage)      If she has chest pain sx as before when she was seen in clinic, would consider a cardiac cath ------

## 2013-07-03 NOTE — Telephone Encounter (Signed)
Left message for pt to call back  °

## 2013-07-04 NOTE — Telephone Encounter (Signed)
Spoke w/ pt.  She reports chest pain "off and on", as well as swelling.  She will consider the cardiac cath and would like to speak with Dr. Mariah Milling briefly at her echo tomorrow.

## 2013-07-04 NOTE — Telephone Encounter (Signed)
Left message for pt call back:  

## 2013-07-04 NOTE — Telephone Encounter (Signed)
Spoke w/ [name].  She will have pt call us as soon as possible.

## 2013-07-05 ENCOUNTER — Other Ambulatory Visit: Payer: Self-pay

## 2013-07-05 ENCOUNTER — Other Ambulatory Visit (INDEPENDENT_AMBULATORY_CARE_PROVIDER_SITE_OTHER): Payer: Medicare Other

## 2013-07-05 ENCOUNTER — Telehealth: Payer: Self-pay

## 2013-07-05 DIAGNOSIS — R0602 Shortness of breath: Secondary | ICD-10-CM

## 2013-07-05 DIAGNOSIS — I369 Nonrheumatic tricuspid valve disorder, unspecified: Secondary | ICD-10-CM | POA: Diagnosis not present

## 2013-07-05 DIAGNOSIS — R079 Chest pain, unspecified: Secondary | ICD-10-CM

## 2013-07-05 DIAGNOSIS — R600 Localized edema: Secondary | ICD-10-CM

## 2013-07-05 DIAGNOSIS — I25810 Atherosclerosis of coronary artery bypass graft(s) without angina pectoris: Secondary | ICD-10-CM

## 2013-07-05 NOTE — Telephone Encounter (Signed)
Spoke w/ pt's daughter, [name].  She will inform pt that we will call her at the beginning of the week with the time, date, and instructions for cath next week.  She will relay message that pt can take an extra lasix for increased swelling over the weekend.

## 2013-07-08 ENCOUNTER — Encounter: Payer: Self-pay | Admitting: *Deleted

## 2013-07-08 ENCOUNTER — Telehealth: Payer: Self-pay

## 2013-07-08 ENCOUNTER — Encounter: Payer: Self-pay | Admitting: Family Medicine

## 2013-07-08 DIAGNOSIS — R0789 Other chest pain: Secondary | ICD-10-CM

## 2013-07-08 DIAGNOSIS — Z01818 Encounter for other preprocedural examination: Secondary | ICD-10-CM

## 2013-07-08 DIAGNOSIS — R9439 Abnormal result of other cardiovascular function study: Secondary | ICD-10-CM

## 2013-07-08 NOTE — Telephone Encounter (Signed)
Pt states she is expecting a phone call discuss catherization. Please call.

## 2013-07-09 ENCOUNTER — Ambulatory Visit (INDEPENDENT_AMBULATORY_CARE_PROVIDER_SITE_OTHER): Payer: Medicare Other

## 2013-07-09 ENCOUNTER — Ambulatory Visit: Payer: Self-pay | Admitting: Cardiovascular Disease

## 2013-07-09 DIAGNOSIS — R9439 Abnormal result of other cardiovascular function study: Secondary | ICD-10-CM

## 2013-07-09 DIAGNOSIS — Z01818 Encounter for other preprocedural examination: Secondary | ICD-10-CM

## 2013-07-09 DIAGNOSIS — R0789 Other chest pain: Secondary | ICD-10-CM | POA: Diagnosis not present

## 2013-07-09 DIAGNOSIS — R079 Chest pain, unspecified: Secondary | ICD-10-CM | POA: Diagnosis not present

## 2013-07-09 MED ORDER — PREDNISONE 20 MG PO TABS: 20.0000 mg | ORAL_TABLET | Freq: Every day | ORAL | Status: DC

## 2013-07-09 NOTE — Telephone Encounter (Signed)
Pt came in today for preprocedure labs and chest x-ray.  Sent rx for prednisone to pharmacy for allergies to dye.

## 2013-07-09 NOTE — Telephone Encounter (Signed)
Spoke w/ pt.  Informed her that we have scheduled cath for 07/12/13 @ 10:30, pt to arrive at [facility] at 9:30. She is to call me back today to let me know when she can come in for labs and chest x-ray. States that she is allergic to iodine, has SOB, whelps and "turns beet red".

## 2013-07-10 LAB — PROTIME-INR: INR: 1.1 (ref 0.8–1.2)

## 2013-07-10 LAB — BASIC METABOLIC PANEL
Calcium: 9.5 mg/dL (ref 8.6–10.2)
Chloride: 95 mmol/L — ABNORMAL LOW (ref 97–108)
Glucose: 108 mg/dL — ABNORMAL HIGH (ref 65–99)
Potassium: 3.7 mmol/L (ref 3.5–5.2)
Sodium: 142 mmol/L (ref 134–144)

## 2013-07-10 LAB — CBC WITH DIFFERENTIAL/PLATELET
Basophils Absolute: 0 10*3/uL (ref 0.0–0.2)
Eosinophils Absolute: 0.3 10*3/uL (ref 0.0–0.4)
Immature Grans (Abs): 0 10*3/uL (ref 0.0–0.1)
Immature Granulocytes: 0 %
Lymphs: 42 %
MCH: 32.7 pg (ref 26.6–33.0)
MCHC: 32.8 g/dL (ref 31.5–35.7)
MCV: 100 fL — ABNORMAL HIGH (ref 79–97)
Monocytes Absolute: 0.6 10*3/uL (ref 0.1–0.9)
Neutrophils Relative %: 46 %
RBC: 3.76 x10E6/uL — ABNORMAL LOW (ref 3.77–5.28)
RDW: 14 % (ref 12.3–15.4)

## 2013-07-12 ENCOUNTER — Ambulatory Visit: Payer: Self-pay | Admitting: Cardiovascular Disease

## 2013-07-12 ENCOUNTER — Encounter: Payer: Self-pay | Admitting: Cardiovascular Disease

## 2013-07-12 DIAGNOSIS — R609 Edema, unspecified: Secondary | ICD-10-CM | POA: Diagnosis not present

## 2013-07-12 DIAGNOSIS — E785 Hyperlipidemia, unspecified: Secondary | ICD-10-CM | POA: Diagnosis not present

## 2013-07-12 DIAGNOSIS — Z8249 Family history of ischemic heart disease and other diseases of the circulatory system: Secondary | ICD-10-CM | POA: Diagnosis not present

## 2013-07-12 DIAGNOSIS — I25810 Atherosclerosis of coronary artery bypass graft(s) without angina pectoris: Secondary | ICD-10-CM | POA: Diagnosis not present

## 2013-07-12 DIAGNOSIS — R079 Chest pain, unspecified: Secondary | ICD-10-CM | POA: Diagnosis not present

## 2013-07-12 DIAGNOSIS — R0602 Shortness of breath: Secondary | ICD-10-CM | POA: Diagnosis not present

## 2013-07-12 DIAGNOSIS — I2510 Atherosclerotic heart disease of native coronary artery without angina pectoris: Secondary | ICD-10-CM | POA: Diagnosis not present

## 2013-07-12 DIAGNOSIS — I10 Essential (primary) hypertension: Secondary | ICD-10-CM | POA: Diagnosis not present

## 2013-07-17 ENCOUNTER — Telehealth: Payer: Self-pay

## 2013-07-17 NOTE — Telephone Encounter (Signed)
BP readings  11/1 138/73 -afternoon, 158/84 -evening  11/2 166/85-morning,  151/75-afternoon 11/3 167/81-morning, 158/66-evening 11/4/ 139/63-morning, 172/85-evening

## 2013-07-17 NOTE — Telephone Encounter (Signed)
Left message for pt to call back  °

## 2013-07-17 NOTE — Telephone Encounter (Signed)
Would increase isosorbide to 30 mg twice a day Also add amlodipine 5 mg daily  Would continue to track blood pressure

## 2013-07-19 ENCOUNTER — Ambulatory Visit: Payer: Medicare Other | Admitting: Cardiovascular Disease

## 2013-07-22 ENCOUNTER — Ambulatory Visit (INDEPENDENT_AMBULATORY_CARE_PROVIDER_SITE_OTHER): Payer: Medicare Other | Admitting: Cardiovascular Disease

## 2013-07-22 ENCOUNTER — Encounter: Payer: Self-pay | Admitting: Cardiovascular Disease

## 2013-07-22 VITALS — BP 130/72 | HR 80 | Ht 60.0 in | Wt 131.5 lb

## 2013-07-22 DIAGNOSIS — I25810 Atherosclerosis of coronary artery bypass graft(s) without angina pectoris: Secondary | ICD-10-CM

## 2013-07-22 DIAGNOSIS — R0602 Shortness of breath: Secondary | ICD-10-CM | POA: Diagnosis not present

## 2013-07-22 DIAGNOSIS — E785 Hyperlipidemia, unspecified: Secondary | ICD-10-CM | POA: Diagnosis not present

## 2013-07-22 DIAGNOSIS — R0789 Other chest pain: Secondary | ICD-10-CM | POA: Diagnosis not present

## 2013-07-22 DIAGNOSIS — R609 Edema, unspecified: Secondary | ICD-10-CM

## 2013-07-22 DIAGNOSIS — R600 Localized edema: Secondary | ICD-10-CM

## 2013-07-22 MED ORDER — ISOSORBIDE MONONITRATE ER 30 MG PO TB24: 30.0000 mg | ORAL_TABLET | Freq: Every day | ORAL | Status: DC

## 2013-07-22 NOTE — Patient Instructions (Addendum)
You are doing well.  Please check the name of the isosorbide: The medication we would like you to take is ISOSORBIDE MONONITRATE 30 MG ONCE A DAY This is a one a day  If you have ISOSORBIDE DINITRATE, TAKE THIS AS NEEDED FOR CHEST PAINS This pill lasts 4 to 6 hours only Ok to take nitro spray as needed   Please start zetia one a day for cholesterol, Continue pravastatin  Please call us if you have new issues that need to be addressed before your next appt.  Your physician wants you to follow-up in: 6 months.  You will receive a reminder letter in the mail two months in advance. If you don't receive a letter, please call our office to schedule the follow-up appointment.

## 2013-07-22 NOTE — Assessment & Plan Note (Signed)
Cholesterol as above goal given her severe diffuse disease. We will add zetia in an effort to get her cholesterol less than 150.

## 2013-07-22 NOTE — Assessment & Plan Note (Signed)
I suspect her leg edema is from venous insufficiency. We have recommended she wear compression hose on the right leg. Left leg has no significant swelling.

## 2013-07-22 NOTE — Progress Notes (Signed)
Patient ID: Erica Stevens, female    DOB: November 23, 1950, 62 y.o.   MRN: [id number]  HPI Comments: [name] is a 62 year old woman with long prior smoking history from age [age] one pack per day he stopped in 2006 at the time of her chest pain and discovery of underlying coronary artery disease, with bypass surgery x3 at [facility] in 2006, by her report with followup catheterization shortly after her chest discomfort at which time she was found to have mild problems with her vein grafts (by her report), history of hyperlipidemia, hypertension with ejection fraction greater than 55% and 2012 who presents with several months of chest pain, shortness of breath, edema. She's a patient of Dr. Charlette Caffey. Notes indicate history of depression, chronic pain  She presents today after recent cardiac catheterization. This was done for chest pain, leg swelling Cardiac catheterization 10 days ago showed severe 2 vessel disease of the LAD and RCA. Vein graft to the RCA is occluded, vein graft to the diagonal is occluded, LIMA to the LAD is patent. Severe proximal LAD disease, diffusely diseased RCA with collateral filling from left to right . No intervention was performed, medical management was recommended .  Since her discharge she reports that she has been doing well with no complaints. Very mild discomfort in her right groin at the site of the catheterization . Minimal bruising .  Review of her medications, she is uncertain if she takes isosorbide dinitrate or mononitrate . She is otherwise active at baseline. She is concerned about the swelling in her right lower extremity which seems to come and go .  Prior echocardiogram January 2012 was essentially normal with ejection fraction greater than 55%, normal right ventricular systolic pressure Carotid ultrasound October 2005 showing no hemodynamically significant disease  EKG today shows sinus bradycardia with rate 80 beats per minute, low voltage through  the anterior precordial leads  Recent lab work shows total cholesterol 174, creatinine 0.89     Outpatient Encounter Prescriptions as of 07/22/2013  Medication Sig  . aspirin 81 MG tablet Take 81 mg by mouth daily.  . enalapril (VASOTEC) 5 MG tablet Take 5 mg by mouth 2 (two) times daily.  . furosemide (LASIX) 40 MG tablet Takes 1/2 to 1 tablet daily.  . isosorbide dinitrate (ISORDIL) 30 MG tablet Take 30 mg by mouth daily.  . metoprolol succinate (TOPROL-XL) 25 MG 24 hr tablet Take 25 mg by mouth daily.  . montelukast (SINGULAIR) 10 MG tablet Take 10 mg by mouth at bedtime.  . nitroGLYCERIN (NITROLINGUAL) 0.4 MG/SPRAY spray Place 1 spray under the tongue every 5 (five) minutes as needed for chest pain.  Marland Kitchen omega-3 acid ethyl esters (LOVAZA) 1 G capsule Take 2 g by mouth 2 (two) times daily.  . OxyCODONE (OXYCONTIN) 20 mg T12A 12 hr tablet Take 20 mg by mouth every 8 (eight) hours.  . potassium chloride SA (K-DUR,KLOR-CON) 20 MEQ tablet Take 20 mEq by mouth daily.  . pravastatin (PRAVACHOL) 40 MG tablet Take 40 mg by mouth 2 (two) times daily.  . predniSONE (DELTASONE) 20 MG tablet Take 1 tablet (20 mg total) by mouth daily. Take 3 tablets the night before procedure and then 3 tablets 1 hour before procedure.  Marland Kitchen tiZANidine (ZANAFLEX) 4 MG tablet Take 4 mg by mouth 2 (two) times daily.  Marland Kitchen tolterodine (DETROL) 2 MG tablet Take 2 mg by mouth 2 (two) times daily.  Marland Kitchen venlafaxine (EFFEXOR) 75 MG tablet Takes 1 tablet am,  2 tablets in the evening daily.    Review of Systems  Constitutional: Negative.   HENT: Negative.   Eyes: Negative.   Cardiovascular: Positive for leg swelling.  Gastrointestinal: Negative.   Endocrine: Negative.   Musculoskeletal: Negative.   Skin: Negative.   Allergic/Immunologic: Negative.   Neurological: Negative.   Hematological: Negative.   Psychiatric/Behavioral: Negative.   All other systems reviewed and are negative.    BP 130/72  Pulse 80  Ht 5' (1.524  m)  Wt 131 lb 8 oz (59.648 kg)  BMI 25.68 kg/m2  Physical Exam  Nursing note and vitals reviewed. Constitutional: She is oriented to person, place, and time. She appears well-developed and well-nourished.  HENT:  Head: Normocephalic.  Nose: Nose normal.  Mouth/Throat: Oropharynx is clear and moist.  Eyes: Conjunctivae are normal. Pupils are equal, round, and reactive to light.  Neck: Normal range of motion. Neck supple. No JVD present.  Cardiovascular: Normal rate, regular rhythm, S1 normal, S2 normal, normal heart sounds and intact distal pulses.  Exam reveals no gallop and no friction rub.   No murmur heard. Pulmonary/Chest: Effort normal and breath sounds normal. No respiratory distress. She has no wheezes. She has no rales. She exhibits no tenderness.  Abdominal: Soft. Bowel sounds are normal. She exhibits no distension. There is no tenderness.  Musculoskeletal: Normal range of motion. She exhibits no edema and no tenderness.  Lymphadenopathy:    She has no cervical adenopathy.  Neurological: She is alert and oriented to person, place, and time. Coordination normal.  Skin: Skin is warm and dry. No rash noted. No erythema.  Psychiatric: She has a normal mood and affect. Her behavior is normal. Judgment and thought content normal.    Assessment and Plan

## 2013-07-22 NOTE — Assessment & Plan Note (Addendum)
Encouraged her to start a regular exercise program for cardiac rehabilitation. She is limited by her knees and hip pain.

## 2013-07-22 NOTE — Assessment & Plan Note (Signed)
Recent cardiac catheterizations. Medical management recommended.

## 2013-07-22 NOTE — Assessment & Plan Note (Signed)
For her stable angina, we will change her isosorbide dinitrate to isosorbide mononitrate  30 mg daily. If she has worsening angina, we could change isosorbide up to twice a day dosing.

## 2013-07-24 DIAGNOSIS — F329 Major depressive disorder, single episode, unspecified: Secondary | ICD-10-CM | POA: Diagnosis not present

## 2013-07-24 DIAGNOSIS — I509 Heart failure, unspecified: Secondary | ICD-10-CM | POA: Diagnosis not present

## 2013-07-24 DIAGNOSIS — E7800 Pure hypercholesterolemia, unspecified: Secondary | ICD-10-CM | POA: Diagnosis not present

## 2013-08-12 DIAGNOSIS — E7800 Pure hypercholesterolemia, unspecified: Secondary | ICD-10-CM | POA: Diagnosis not present

## 2013-08-12 DIAGNOSIS — R300 Dysuria: Secondary | ICD-10-CM | POA: Diagnosis not present

## 2013-08-12 DIAGNOSIS — G894 Chronic pain syndrome: Secondary | ICD-10-CM | POA: Diagnosis not present

## 2013-08-12 DIAGNOSIS — I10 Essential (primary) hypertension: Secondary | ICD-10-CM | POA: Diagnosis not present

## 2013-10-14 DIAGNOSIS — G894 Chronic pain syndrome: Secondary | ICD-10-CM | POA: Diagnosis not present

## 2013-10-14 DIAGNOSIS — I10 Essential (primary) hypertension: Secondary | ICD-10-CM | POA: Diagnosis not present

## 2013-10-14 DIAGNOSIS — G43909 Migraine, unspecified, not intractable, without status migrainosus: Secondary | ICD-10-CM | POA: Diagnosis not present

## 2013-12-12 DIAGNOSIS — G894 Chronic pain syndrome: Secondary | ICD-10-CM | POA: Diagnosis not present

## 2013-12-12 DIAGNOSIS — L989 Disorder of the skin and subcutaneous tissue, unspecified: Secondary | ICD-10-CM | POA: Diagnosis not present

## 2013-12-12 DIAGNOSIS — J301 Allergic rhinitis due to pollen: Secondary | ICD-10-CM | POA: Diagnosis not present

## 2014-01-11 ENCOUNTER — Emergency Department: Payer: Self-pay | Admitting: Emergency Medicine

## 2014-01-11 DIAGNOSIS — I252 Old myocardial infarction: Secondary | ICD-10-CM | POA: Diagnosis not present

## 2014-01-11 DIAGNOSIS — Z882 Allergy status to sulfonamides status: Secondary | ICD-10-CM | POA: Diagnosis not present

## 2014-01-11 DIAGNOSIS — S0993XA Unspecified injury of face, initial encounter: Secondary | ICD-10-CM | POA: Diagnosis not present

## 2014-01-11 DIAGNOSIS — Z7982 Long term (current) use of aspirin: Secondary | ICD-10-CM | POA: Diagnosis not present

## 2014-01-11 DIAGNOSIS — S4980XA Other specified injuries of shoulder and upper arm, unspecified arm, initial encounter: Secondary | ICD-10-CM | POA: Diagnosis not present

## 2014-01-11 DIAGNOSIS — S46909A Unspecified injury of unspecified muscle, fascia and tendon at shoulder and upper arm level, unspecified arm, initial encounter: Secondary | ICD-10-CM | POA: Diagnosis not present

## 2014-01-11 DIAGNOSIS — Z888 Allergy status to other drugs, medicaments and biological substances status: Secondary | ICD-10-CM | POA: Diagnosis not present

## 2014-01-11 DIAGNOSIS — S42213A Unspecified displaced fracture of surgical neck of unspecified humerus, initial encounter for closed fracture: Secondary | ICD-10-CM | POA: Diagnosis not present

## 2014-01-11 DIAGNOSIS — Z79899 Other long term (current) drug therapy: Secondary | ICD-10-CM | POA: Diagnosis not present

## 2014-01-11 DIAGNOSIS — I509 Heart failure, unspecified: Secondary | ICD-10-CM | POA: Diagnosis not present

## 2014-01-11 DIAGNOSIS — J8410 Pulmonary fibrosis, unspecified: Secondary | ICD-10-CM | POA: Diagnosis not present

## 2014-01-11 DIAGNOSIS — J438 Other emphysema: Secondary | ICD-10-CM | POA: Diagnosis not present

## 2014-01-11 DIAGNOSIS — Z951 Presence of aortocoronary bypass graft: Secondary | ICD-10-CM | POA: Diagnosis not present

## 2014-01-14 DIAGNOSIS — S40019A Contusion of unspecified shoulder, initial encounter: Secondary | ICD-10-CM | POA: Diagnosis not present

## 2014-02-11 DIAGNOSIS — F329 Major depressive disorder, single episode, unspecified: Secondary | ICD-10-CM | POA: Diagnosis not present

## 2014-02-11 DIAGNOSIS — G894 Chronic pain syndrome: Secondary | ICD-10-CM | POA: Diagnosis not present

## 2014-02-11 DIAGNOSIS — I2510 Atherosclerotic heart disease of native coronary artery without angina pectoris: Secondary | ICD-10-CM | POA: Diagnosis not present

## 2014-02-11 DIAGNOSIS — I10 Essential (primary) hypertension: Secondary | ICD-10-CM | POA: Diagnosis not present

## 2014-04-10 DIAGNOSIS — I10 Essential (primary) hypertension: Secondary | ICD-10-CM | POA: Diagnosis not present

## 2014-04-10 DIAGNOSIS — F329 Major depressive disorder, single episode, unspecified: Secondary | ICD-10-CM | POA: Diagnosis not present

## 2014-04-10 DIAGNOSIS — F3289 Other specified depressive episodes: Secondary | ICD-10-CM | POA: Diagnosis not present

## 2014-04-10 DIAGNOSIS — I2510 Atherosclerotic heart disease of native coronary artery without angina pectoris: Secondary | ICD-10-CM | POA: Diagnosis not present

## 2014-04-10 DIAGNOSIS — G894 Chronic pain syndrome: Secondary | ICD-10-CM | POA: Diagnosis not present

## 2014-06-12 DIAGNOSIS — E780 Pure hypercholesterolemia: Secondary | ICD-10-CM | POA: Diagnosis not present

## 2014-06-12 DIAGNOSIS — G4701 Insomnia due to medical condition: Secondary | ICD-10-CM | POA: Diagnosis not present

## 2014-06-12 DIAGNOSIS — Z23 Encounter for immunization: Secondary | ICD-10-CM | POA: Diagnosis not present

## 2014-07-10 DIAGNOSIS — Z23 Encounter for immunization: Secondary | ICD-10-CM | POA: Diagnosis not present

## 2014-08-13 DIAGNOSIS — I110 Hypertensive heart disease with heart failure: Secondary | ICD-10-CM | POA: Diagnosis not present

## 2014-08-13 DIAGNOSIS — F329 Major depressive disorder, single episode, unspecified: Secondary | ICD-10-CM | POA: Diagnosis not present

## 2014-08-13 DIAGNOSIS — I5030 Unspecified diastolic (congestive) heart failure: Secondary | ICD-10-CM | POA: Diagnosis not present

## 2014-09-11 DIAGNOSIS — F329 Major depressive disorder, single episode, unspecified: Secondary | ICD-10-CM | POA: Diagnosis not present

## 2014-11-10 DIAGNOSIS — G894 Chronic pain syndrome: Secondary | ICD-10-CM | POA: Diagnosis not present

## 2014-11-11 ENCOUNTER — Other Ambulatory Visit: Payer: Self-pay | Admitting: Cardiovascular Disease

## 2014-11-27 ENCOUNTER — Ambulatory Visit: Payer: Medicare Other | Admitting: Cardiovascular Disease

## 2014-12-11 ENCOUNTER — Ambulatory Visit: Payer: Medicare Other | Admitting: Cardiovascular Disease

## 2014-12-11 ENCOUNTER — Encounter: Payer: Self-pay | Admitting: *Deleted

## 2014-12-30 NOTE — Discharge Summary (Signed)
PATIENT NAME:  Erica Stevens, Sheily G MR#:  [id number] DATE OF BIRTH:  March 30, 1951  DATE OF ADMISSION:  05/22/2012 DATE OF DISCHARGE:  05/26/2012  ADMITTING DIAGNOSIS: Left femoral neck fracture, impacted.   DISCHARGE DIAGNOSIS: Left femoral neck fracture, impacted.   PROCEDURE: Left hip pinning.   SURGEON: Leitha SchullerMichael J. Menz, M.D.   ANESTHESIA: General.   ESTIMATED BLOOD LOSS: 50 mL.  COMPLICATIONS: None.  SPECIMEN: None.  IMPLANTS: Synthes 6.5 cannulated screws x4.   HISTORY: The patient suffered a fall home on 05/22/2012. She tripped and fell, felt a pop, came to the ER where she was found to have a femoral neck fracture and was admitted for this.   PHYSICAL EXAMINATION: GENERAL: Slender, white female, appears older than her stated age in mild distress. HEENT: Remarkable for full upper and lower dentures. LUNGS: Clear to auscultation. HEART: Regular rate and rhythm. ABDOMEN: Soft, nontender. EXTREMITIES: Pain with any log rolling of the left leg. She did not appear to have any shortening or external rotation deformity. She is able to flex and extend the toes. Sensation intact. Trace dorsalis pedis pulse.   HOSPITAL COURSE: The patient was admitted to the hospital on 05/22/2012. She was evaluated by the internist for her chronic medical problems of heart disease, cardiomyopathy, hypertension and coronary artery disease, as well as hyperlipidemia and depression. The patient was deemed stable for surgery and on 05/23/2012 she had surgery. She was stable. She was brought to the orthopedic floor from the PAC-U in stable condition. The patient did well. The patient's vital signs and lab work were monitored. The patient remained stable. On 05/26/2012, the patient had progressed well with physical therapy and was ready for discharge home with home PT.   DISCHARGE INSTRUCTIONS:  1. The patient may gradually increase weight-bearing on the affected extremity.  2. She is to wear knee-high TED hose on both legs  and remove at bedtime, replace on arising the next morning. 3. She may resume a regular diet as tolerated.  4. Lovenox 30 mg subcutaneous twice a day as her medication.  5. Pain meds consist of Ultram 1 to 2 tablets every six hours as needed for pain and oxycodone 5 to 10 mg every four hours as needed for pain.  6. Wound care: Do not get the dressing bandage wet or dirty. Call [facility] if the dressing gets water under it. Leave the dressing on. Change dressing as needed. Remove staples and apply benzoin and Steri-Strips on 09/25. Call [facility] if any of the following occur: Bright red bleeding from the incision wound, fever above 101.5 degrees, redness, swelling, or drainage at the incision.  7. The patient is referred to home physical therapy. Call [facility] if they have not contacted her within 48 hours.  8. Comments: Do not take a shower until staples are removed.  9. She needs a follow-up in two weeks for staple removal and then again in six weeks for x-rays.   ADDITIONAL MEDICATION:  1. Aspirin 81 mg once a day after finishing Lovenox.  2. She can continue with her regular home medications.    ____________________________ Evon Slackhomas C. Phineas Mcenroe, PA-C tcg:ap D: 06/07/2012 13:11:29 ET T: 06/08/2012 11:20:53 ET JOB#: [id number]  cc: Evon Slackhomas C. Jacquelyn Antony, PA-C, <Dictator>  Evon SlackHOMAS C Dorsie Burich GeorgiaPA ELECTRONICALLY SIGNED 06/12/2012 13:49

## 2014-12-30 NOTE — Op Note (Signed)
PATIENT NAME:  Erica Stevens, Erica Stevens MR#:  [id number] DATE OF BIRTH:  03/24/1951  DATE OF PROCEDURE:  05/23/2012  PREOPERATIVE DIAGNOSIS: Left femoral neck fracture, impacted.   POSTOPERATIVE DIAGNOSIS: Left femoral neck fracture, impacted.   PROCEDURE: Left hip pinning.  SURGEON: Leitha SchullerMichael J. Crockett Rallo, MD  ANESTHESIA: General.  DESCRIPTION OF PROCEDURE: Patient brought to the Operating Room and after adequate anesthesia was obtained, the patient was placed on the traction table with the hip prepped and draped in sterile fashion. After prepping and draping with the barrier drape method, good visualization of the fracture was obtained and remained in an impacted position. No change from initial x-ray. After localizing the fracture with fluoroscopy a small incision was made laterally to obtain tissue spread and guidewires were inserted across the fracture site into the head close to the subchondral bone. After that standard technique with 6.5 short thread screws then utilized drilling the lateral cortex, tapping, and then placing the cannulated screws. Four screws were placed in a diamond-shaped pattern that seemed to give good fixation to the subchondral bone. The wound was irrigated and then closed with 2-0 Vicryl subcutaneously and skin staples. Xeroform, 4 x 4's, ABD and tape applied. Patient was sent to recovery in stable condition.   ESTIMATED BLOOD LOSS: 50 mL.   COMPLICATIONS: None.   SPECIMEN: None.   IMPLANTS: Synthes 6.5 cannulated screws x4.  ____________________________ Leitha SchullerMichael J. Gaynell Eggleton, MD mjm:cms D: 05/23/2012 17:16:34 ET T: 05/23/2012 17:44:29 ET  JOB#: [id number] cc: Leitha SchullerMichael J. Karma Ansley, MD, <Dictator> Leitha SchullerMICHAEL J Dreyson Mishkin MD ELECTRONICALLY SIGNED 05/24/2012 7:29

## 2014-12-30 NOTE — Consult Note (Signed)
Brief Consult Note: Diagnosis: Pre op eval for left hip fracture, h/o CAD s/p CABG, Cardiomyopathy, HTN and hyperlipidemia.   Patient was seen by consultant.   Consult note dictated.   Recommend to proceed with surgery or procedure.   Orders entered.   Comments: 64y/o F with [facility] of [facility], cardiomyopathy last EF 55% in 2012, Hyperlipidemia who lives at home and active at baseline tripped and fell and found to have left hip fracture. Medical consult requested for pre op eval.  * Pre op eval- risk factors include CAD, age and cardiomyopathy, Her bypass was abt >5 yrs ago, asymptomatic currently without any chest pain, dyspnea or syncope. Moderate risk procedure cont metoprolol prior and after surgery EKG pending, if EKG looks ok, can proceed with surgery  * Cardiomyopathy- last EF 55% in jan 2012, no symptoms or signs of any fluid overload. she takes lasix for lower extr edema- will hold lasix as appears dry now  * HTN- cont home meds  * Hip fracture- mgmt per surgery  * CAD- stable as mentioned above.  Electronic Signatures: Enid BaasKalisetti, Doshie Maggi (MD)  (Signed 10-Sep-13 18:12)  Authored: Brief Consult Note   Last Updated: 10-Sep-13 18:12 by Enid BaasKalisetti, Nawaal Alling (MD)

## 2014-12-30 NOTE — Consult Note (Signed)
PATIENT NAME:  Erica Stevens, Erica Stevens MR#:  [id number] DATE OF BIRTH:  Apr 20, 1951  DATE OF CONSULTATION:  05/22/2012  REFERRING PHYSICIAN:  Kennedy Bucker, MD CONSULTING PHYSICIAN:  Enid Baas, MD  ADMITTING PHYSICIAN: Kennedy Bucker, MD  REASON FOR CONSULTATION: Preop evaluation for left hip fracture surgery.   PRIMARY CARE PHYSICIAN: Dennison Mascot, MD  BRIEF HISTORY: Ms. Bischoff is a 64 year old Caucasian female with past medical history significant for coronary artery disease status post bypass graft surgery, cardiomyopathy, chronic bilateral lower extremity edema, depression, and gastroesophageal reflux disease who was working in her yard today and tripped over a tree root and fell on her back and was brought to the Emergency Room. In the ER, she was found to have a left hip fracture and is being admitted to orthopedics and medical consultation is requested for preop evaluation. The patient denies any chest pain. She states that she likes walking and walks quite a bit and does not have any dyspnea on exertion, chest pain, or syncopal symptoms. Her bypass surgery was more than five years ago and has not had stents or any other cardiac issues after that. She follows up with Dr. [place] at [facility] and is due for an appointment sometime soon, next month. Her labs otherwise look okay. EKG is pending at this time.   PAST MEDICAL HISTORY:  1. Coronary artery disease status post bypass graft surgery and follows up at [facility].  2. Cardiomyopathy last year, ejection fraction was 55% in 2012.  3. Gastroesophageal reflux disease. 4. Migraine headaches.  5. Depression.  6. Chronic lower extremity edema.   PAST SURGICAL HISTORY:   1. Cholecystectomy. 2. Appendectomy.  3. Hysterectomy.  4. Left knee arthroscopic surgery.  5. Cardiac catheterization in the past.  6. Coronary artery bypass graft surgery.   ALLERGIES TO MEDICATIONS: She is allergic to NSAIDs, sulfa, and tape.  HOME  MEDICATIONS: 1. Aspirin 81 mg p.o. daily.  2. Detrol LA 2 mg p.o. twice a day. 3. Effexor 75 mg p.o. twice a day. 4. Enalapril 2.5 mg p.o. twice a day. 5. Lasix 40 mg 1/2 to 1 tablet daily as needed for lower extremity edema.  6. Imdur 30 mg p.o. daily.  7. Lovaza 1000 mg p.o. twice a day. 8. Metoprolol succinate 25 mg p.o. daily.  9. Niaspan 1000 mg p.o. daily.  10. Percocet 5/325 mg one tablet every 4 hours p.r.n. for pain.  11. Potassium chloride 20 mEq twice a day. 12. Pravachol 40 mg p.o. daily at bedtime.  SOCIAL HISTORY: She lives at home with her son, her son's girlfriend, and their kids live with her. She smokes about one pack per day. She denies any alcohol use.   FAMILY HISTORY: Mom is alive and healthy. Heart disease and hypertension run in the family.   REVIEW OF SYSTEMS: CONSTITUTIONAL: No fever, fatigue, or weakness. EYES: Uses reading glasses. No inflammation, glaucoma, or cataracts. ENT: No tinnitus or ear pain. Mild hearing loss. No epistaxis or discharge. RESPIRATORY: No cough, wheeze, hemoptysis, or chronic obstructive pulmonary disease. CARDIOVASCULAR: No chest pain, orthopnea, edema, arrhythmia, palpitations, or syncope. GI: No nausea, vomiting, diarrhea, abdominal pain, hematemesis, or melena. GENITOURINARY: No dysuria, hematuria, renal calculus, frequency, or incontinence. ENDOCRINE: No polyuria, nocturia, thyroid problems, or heat or cold intolerance. HEMATOLOGY: No anemia, bruising or bleeding. SKIN: No acne, rash, or lesions. MUSCULOSKELETAL: Currently has left hip pain, also has arthritis. NEUROLOGIC: No numbness, weakness, cerebrovascular accident, transient ischemic attack, or seizures. PSYCHOLOGICAL: No anxiety, insomnia, or depression.  PHYSICAL EXAMINATION:   VITAL SIGNS: Temperature 97.1 degrees Fahrenheit, pulse 83, respirations 18, blood pressure 120/64, and pulse oximetry 97% on room air.  GENERAL: Well-built, well-nourished female lying in bed kind  of sleepy because she got some pain medication just now.   HEENT: Normocephalic, atraumatic. Pupils are equal, round, and reacting to light. Anicteric sclerae. Extraocular movements are intact. Oropharynx is clear without erythema, mass or exudates.   NECK: Supple. No lymphadenopathy. Slight soft tissue swelling in the supraclavicular region. It does not look like thyromegaly as not moving with swallowing, however, the patient is sleepy and does not cooperate for further examination.   LUNGS: Clear to auscultation bilaterally. No wheeze or crackles. No use of accessory muscles for breathing.   HEART: S1 and S2, regular rate and rhythm. She has a large 3/6 systolic murmur all over the precordial region. No rubs or gallops.   ABDOMEN: Soft, nontender, and nondistended. No hepatosplenomegaly. Normal bowel sounds.   EXTREMITIES: 1+ pedal edema, ankle edema on the right ankle. On the left leg, no pedal edema. The left leg is straightened out with some external rotation. It is also tender to touch in the hip area.  SKIN: No acne, rash, or lesions.   LYMPHATICS: No cervical lymphadenopathy.   NEUROLOGIC: Cranial nerves intact. Able to move all three extremities, other than the left leg due to pain. No other focal motor or sensory deficits identified.   PSYCH: The patient is sleepy due to pain medication, but easily arousable and oriented x3.   LABORATORY, DIAGNOSTIC AND RADIOLOGIC DATA: WBC 10.6, hemoglobin 13.0, hematocrit 37.0, and platelet count 177.  Sodium 139, potassium 3.6, chloride 103, bicarbonate 26, BUN 10, creatinine 0.93, glucose 99, and calcium 9.1.   ALT 18, AST 38, alkaline phosphatase 113, total bilirubin 0.8, and albumin 3.8.   INR 0.9. PTT 31.5.   CT of the head without contrast is showing no acute intracranial process.   CT of the C-spine is showing no acute osseous injury of cervical spine.  Left hip x-ray is showing impacted fracture of subcapital region on the left  side.   Left knee x-ray is showing no fracture of the left knee or right knee. No acute bony abnormality of the right knee.   Lumbar spine x-ray is showing no acute abnormality of the lumbar spine.  Chest x-ray shows bypass graft surgery changes, but no pneumonia, pleural effusion, or pulmonary edema identified.   EKG: Normal sinus rhythm. No acute ST-T wave abnormalities identified.   ASSESSMENT AND RECOMMENDATIONS: A 64 year old female with past medical history significant for coronary artery disease, cardiomyopathy, hypertension, and hyperlipidemia admitted for left hip fracture and medical consult is requested for preoperative evaluation. 1. Preoperative evaluation. Her risk factors include coronary artery disease, age, and cardiomyopathy which makes her high risk candidate for a moderate risk procedure. However, her bypass surgery was more than five years ago and she is asymptomatic currently without any chest pain, dyspnea, or syncope, and she is active at baseline. EKG looks like normal sinus rhythm without any acute ST-T wave concerning changes. So can go ahead and proceed with the surgery, especially with the amount of pain she is in. Her benefit might override her risks. Continue metoprolol prior and after the surgery.  2. Cardiomyopathy. Last ejection fraction was 55% in 2012. No symptoms or signs of any fluid overload. She takes Lasix actually for lower extremity edema, but now she has minimal edema and appears very well compensated so we will hold  Lasix as she clinically appears dry. Continue other medications.  3. Hypertension. The patient is on enalapril and Imdur and metoprolol, which we will continue. Lasix is on hold.  4. Left hip fracture secondary to mechanical fall. Orthopedic management per orthopedics. Pain medications. Hopefully go to surgery tomorrow. Postoperative DVT prophylaxis and pain management and physical therapy consultation. 5. Coronary artery disease. Appears stable  at this time, as mentioned above.  6. Urinary incontinence. She is on Detrol. 7. Depression. She is on Effexor, which we will continue at this time. 8. Hyperlipidemia. She is on Lovaza, Niaspan, and pravastatin.  CODE STATUS: FULL CODE.  TIME SPENT ON CONSULTATION: 50 minutes. ____________________________ Enid Baas, MD rk:slb D: 05/22/2012 18:25:30 ET T: 05/23/2012 08:33:39 ET JOB#: [id number]  cc: Enid Baas, MD, <Dictator> Dennison Mascot, MD Enid Baas MD ELECTRONICALLY SIGNED 05/23/2012 18:59

## 2014-12-30 NOTE — H&P (Signed)
PATIENT NAME:  Erica Stevens, Erica Stevens MR#:  [id number] DATE OF BIRTH:  09/21/1950  DATE OF ADMISSION:  05/22/2012  CHIEF COMPLAINT: Left hip pain.   HISTORY OF PRESENT ILLNESS: The patient suffered a fall at home today. She tripped and fell, felt a pop, and came to the Emergency Room where she was found to have a femoral neck fracture and is being admitted for treatment of this.   PAST MEDICAL HISTORY: 1. Coronary artery disease. 2. History of cardiomyopathy.  3. Hypertension. 4. High lipids.  5. She has a history of prior admission with problems with overuse of narcotics. 6. Migraines. 7. Depression. 8. Reflux.   PAST SURGICAL HISTORY:  1. Gallbladder. 2. Appendectomy. 3. Hysterectomy. 4. Knee arthroscopy.  5. Coronary artery bypass.   SOCIAL HISTORY: Positive for smoking.   FAMILY HISTORY: Noncontributory.   ALLERGIES: Sulfa gives a rash and anti-inflammatories secondary to renal insufficiency.    MEDICATIONS ON ADMISSION:  1. Pravastatin 40 mg p.o. at bedtime.  2. KCl 20 mEq b.i.d.  3. Percocet 5 one p.o. q.4 hours as needed for pain. 4. Niaspan extended-release 1000 mg at night. 5. Metoprolol succinate 25 mg daily. 6. Lovaza 1000 mg b.i.d.  7. Isosorbide mononitrate 30 mg p.o. daily.  8. Lasix 40 mg half to 1 tablet daily as needed for swelling. 9. Enalapril 2.5 mg b.i.d.  10. Effexor 75 mg b.i.d.  11. Detrol LA 2 mg b.i.d.  12. Aspirin 81 mg daily.   REVIEW OF SYSTEMS: Positive just for the left hip pain. She denies any other complaints. No chest pain or shortness of breath. She reports that she has been ambulatory without assistive devices.   PHYSICAL EXAMINATION:   GENERAL: Slender white female who appears older than her stated age in mild distress.   HEENT: Remarkable for full upper and lower dentures.   LUNGS: Clear to auscultation.   HEART: Regular rate and rhythm.   ABDOMEN: Soft, nontender.   EXTREMITIES: Pain with any logrolling of the left leg. She  really does not appear to have shortening or external rotation deformity. She is able to flex and extend the toes. Sensation intact. Trace dorsalis pedis pulse.   X-rays show an impacted femoral neck fracture.   CLINICAL IMPRESSION: Left femoral neck fracture, impacted.   RECOMMENDATION: Recommendation is for hip pinning. I discussed with her and her family potential problems, in particular blood clot and infection, failure for the fracture to heal, and avascular necrosis as a late complication which might necessitate revision to hip replacement. We also discussed doing a partial or complete hip replacement at this time and I agree with her that attempt to get this to heal is probably indicated. She is agreeable to that and will admit today with preop medical evaluation by hospitalist and hope for multiple hip pinning tomorrow. Bedrest until that time.    ____________________________ Leitha SchullerMichael J. Admiral Marcucci, MD mjm:drc D: 05/22/2012 22:45:59 ET T: 05/23/2012 05:45:13 ET JOB#: [id number]  cc: Leitha SchullerMichael J. Marygrace Sandoval, MD, <Dictator> Leitha SchullerMICHAEL J Iasia Forcier MD ELECTRONICALLY SIGNED 05/23/2012 11:52

## 2015-01-02 NOTE — H&P (Signed)
PATIENT NAME:  Erica Stevens, Erica Stevens MR#:  [id number] DATE OF BIRTH:  [birth year]  DATE OF ADMISSION:  12/15/2012  PRIMARY CARE PHYSICIAN:  Dennison MascotLemont Morrisey, M.D.   REFERRING PHYSICIAN:  Dr. Dorothea GlassmanPaul Malinda.   CHIEF COMPLAINT:  Cough x 1 week and shortness of breath x 24 hours.   HISTORY OF PRESENT ILLNESS:  The patient is a 64 year old Caucasian female with history of tobacco abuse.  She continues to smoke.  History of coronary artery disease and cardiomyopathy, systemic hypertension.  She was in her usual state of health until the last one week when she started to have dry cough.  This had progressed on and off until the last 24 hours when she became more short of breath and wheezing.  She called EMS who gave her nebulization treatment after which some of her wheezing showed improvement.  Transferred to the Emergency Department where evaluation revealed right lower lobe pneumonia.  The patient was admitted to the hospital for further evaluation and treatment.   REVIEW OF SYSTEMS:  CONSTITUTIONAL:  The patient reports fever and chills over the last 24 hours.  Mild fatigue.  EYES:  No blurring of vision.  No double vision.  EARS, NOSE, THROAT:  She has moderate hearing impairment bilateral.  This is chronic.  No sore throat.  No dysphagia.  CARDIOVASCULAR:  No chest pain, but she has shortness of breath.  She has chronic mild pedal edema.  No syncope.  No palpitations.  RESPIRATORY:  She has dry cough, wheezing and shortness of breath.  No hemoptysis.  GASTROINTESTINAL:  No abdominal pain.  No vomiting.  No diarrhea.  GENITOURINARY:  No dysuria.  No frequency of urination, but she has chronic urinary incontinence.  MUSCULOSKELETAL:  No joint pain or swelling other than her chronic pains.  No muscular pain or swelling.  INTEGUMENTARY:  No skin rash.  No ulcers.  NEUROLOGY:  No focal weakness.  No seizure activity.  No headache.  PSYCHIATRY:  No anxiety or depression now.  ENDOCRINE:  No polyuria or  polydipsia.  No heat or cold intolerance.   PAST MEDICAL HISTORY:  Coronary artery disease, status post coronary artery bypass graft and history of cardiomyopathy.  Systemic hypertension.  Gastroesophageal reflux disease, migraine headaches, lower extremity edema, heart murmur and past history of depression.  Last admission to this hospital was in September 2013 with left femoral neck fracture and underwent left hip pinning.   PAST SURGICAL HISTORY:  Left hip fracture status post pinning in September 2013, history of right leg compartment syndrome in January 2012, underwent decompression surgery.  Cholecystectomy, appendectomy, abdominal hysterectomy.   FAMILY HISTORY:  Her father died from a motor vehicle accident.  Her mother is still alive.  She has some heart problems and she is now at age of 64.  Her son has asthma and her daughter has obstructive sleep apnea.   SOCIAL HABITS:  Chronic smoker 1 pack per day since age of 64.  She quit in 2010, then she was back smoking again, now is about half pack a day, sometimes five cigarettes a day.  No history of alcohol or drug abuse.   SOCIAL HISTORY:  She is divorced and lives at home with her son.   ADMISSION MEDICATIONS:  Pravastatin 40 mg a day, potassium chloride 20 mEq twice a day, oxycodone 5 mg 1 to 2 tablets every 4 hours as needed, metoprolol succinate 25 mg once a day, Lovaza 1000 mg twice a day, isosorbide mononitrate 30 mg once  a day, furosemide 40 mg taking 1/2 tablet a day, sometimes a whole 40 mg once a day.  Enalapril 2.5 mg twice a day, Effexor 75 mg twice a day, Detrol LA 2 mg twice a day, aspirin 81 mg a day.   ALLERGIES:  SULFA CAUSING SKIN RASH.  NONSTEROIDAL ANTI-INFLAMMATORY CAUSING GI SIDE EFFECTS.  SHE IS ALSO ALLERGIC TO THE TAPE.   PHYSICAL EXAMINATION: VITAL SIGNS:  Blood pressure 164/79, respiratory rate 20, pulse 102, temperature 99.1, oxygen saturation 98%.  GENERAL APPEARANCE:  Elderly female lying in bed in no acute  distress.  HEAD AND NECK:  No pallor.  No icterus.  No cyanosis.  EARS, NOSE, THROAT:  Ear examination revealed moderate bilateral hearing impairment.  This is chronic.  No discharge, no lesions.  Examination of the nose showed no discharge, no bleeding.  Examination of the oropharyngeal area showed no oral thrush.  No exudates.  No ulcers.  She is edentulous and she has dentures.  EYES:  Examination revealed normal eyelids and conjunctivae.  Pupils about 6 mm, equal and reactive to light.  NECK:  Supple.  Trachea at midline.  No thyromegaly.  No cervical lymphadenopathy.  No masses.  HEART:  Revealed normal S1, S2.  No S3 or S4.  There is grade 2 to 3 systolic murmur at the apex and left sternal border.  No carotid bruits.  RESPIRATORY:  Revealed normal breathing pattern without use of accessory muscles.  Few crackles at the right base.  These are scattered, associated with scattered rhonchi and intermittent wheezing.  ABDOMEN:  Soft without tenderness.  No hepatosplenomegaly.  No masses.  No hernias.  SKIN:  No ulcers.  No subcutaneous nodules.  MUSCULOSKELETAL:  No joint swelling.  No clubbing.  NEUROLOGIC:  Cranial nerves II through XII are intact.  No focal motor deficit.  PSYCHIATRIC:  The patient is alert and oriented x 3.  Mood and affect were normal.   LABORATORY FINDINGS:  Chest x-ray showed right lower lobe infiltrates.  Her EKG showed sinus tachycardia at a rate of 103 per minute.  Poor progression of R waves in the anterior chest leads, otherwise unremarkable EKG.  Her blood work-up showed BNP of 403, glucose 112, BUN 9, creatinine 0.9, sodium 139, potassium 2.9.  Calcium 8.5.  Normal liver function tests except for a slight elevation of AST at 54.  Troponin 0.03.  TSH was 0.8.  CBC showed white count of 17,000, hemoglobin 12, hematocrit 35, platelet count 187.   ASSESSMENT: 1.  Right lower lobe pneumonia.  2.  Hypokalemia.  3.  Coronary artery disease, status post coronary artery  bypass graft.  4.  Systemic hypertension.  5.  Tobacco abuse.   PLAN:  We will admit the patient to the medical floor.  Blood cultures x 2 were taken.  Intravenous Levaquin started.  DuoNebs q. 4 hours to q. 6 hours as needed.  Oxygen supplementation.  Robitussin as needed for the cough.  Replacement for the potassium depletion.  Continue the rest of her home medications as listed above.  For deep vein thrombosis prophylaxis, we will place the patient on heparin subcutaneously twice a day.  The patient is advised to quit smoking.  The patient indicates she has no LIVING WILL, but she is FULL CODE.    Time spent in evaluating this patient took more than 55 minutes.    ____________________________ Carney Corners. Rudene Re, MD amd:ea D: 12/15/2012 03:48:39 ET T: 12/15/2012 04:49:33 ET JOB#: [id number]  cc: Karolee Ohs  Dala Dock, MD, <Dictator> Karolee Ohs Dala Dock MD ELECTRONICALLY SIGNED 12/16/2012 2:14

## 2015-01-02 NOTE — Discharge Summary (Signed)
PATIENT NAME:  Erica BeckwithWESSON, Sabeen G MR#:  [id number] DATE OF BIRTH:  May 13, 1951  DATE OF ADMISSION:  12/15/2012 DATE OF DISCHARGE:  12/15/2012  PRIMARY CARE PHYSICIAN:  Dr. Thana AtesMorrisey.   DISCHARGE DIAGNOSES:  1.  Right-sided community-acquired pneumonia.  2.  Leukocytosis.  3.  Coronary artery disease and cardiomyopathy.  4.  Hypertension.  5.  Tobacco abuse.  The patient counseled for greater than 3 minutes to quit.   IMAGING STUDIES DONE INCLUDE:  Chest x-ray which showed right-sided pneumonia.   ADMITTING HISTORY AND PHYSICAL AND HOSPITAL COURSE:  Please see detailed H and P by Dr. Rudene Rearwish.  In brief, a 64 year old Caucasian female patient with history of tobacco abuse, who continues to smoke presented to the Emergency Room complaining of cough of a week and shortness of breath of 24 hours.  The patient was found to have right-sided pneumonia along with elevated white count and admitted to the hospitalist service for further treatment.  The patient was started on IV Levaquin, nebulizers, oxygen support with which she improved well. On the day of discharge, the patient does not have any fever, white count is trending down and will be continued on Levaquin orally for another week and discharged home.  Prior to discharge the patient did not have any crackles or wheezing on lung exam.  Temperature 98.7, blood pressure 126/72 and discharged home in a stable condition.   DISCHARGE MEDICATIONS:  Include:  1.  Detrol LA 2 mg oral 2 times a day.  2.  Effexor 75 mg oral 2 times a day.  3.  Enalapril 2.5 mg oral 2 times a day.  4.  Metoprolol succinate 25 mg oral once a day.  5.  Lovaza 1000 mg oral 2 times a day.  6.  Isosorbide mononitrate 30 mg oral once a day.  7.  Potassium chloride 20 mEq oral 2 times a day.  8.  Pravastatin 40 mg oral once a day.  9.  Aspirin 81 mg oral once a day.  10.  Oxycodone 5 mg oral every 4 hours as needed for pain.  11.  Lasix 40 mg oral once a day.  12.   Dextromethorphan/guaifenesin 10 mL every 6 hours as needed for cough.  13.  Levaquin 500 mg oral once a day for 7 days.   DISCHARGE INSTRUCTIONS:  The patient was discharged home on a cardiac diet.  Activity as tolerated.  Follow up with primary care physician within a week.  The patient was advised to call her doctor or return to the Emergency Room if symptoms worsen.   TIME SPENT:  On day of discharge in discharge activity was 40 minutes.    ____________________________ Molinda BailiffSrikar R. Verlene Glantz, MD srs:ja D: 12/16/2012 13:57:51 ET T: 12/16/2012 19:38:24 ET JOB#: [id number]  cc: Wardell HeathSrikar R. Elpidio AnisSudini, MD, <Dictator> Dennison MascotLemont Morrisey, MD Wardell HeathSRIKAR West Bali Kyland No MD ELECTRONICALLY SIGNED 12/17/2012 14:57

## 2015-01-07 DIAGNOSIS — G8929 Other chronic pain: Secondary | ICD-10-CM | POA: Diagnosis not present

## 2015-01-07 DIAGNOSIS — R52 Pain, unspecified: Secondary | ICD-10-CM | POA: Diagnosis not present

## 2015-03-03 ENCOUNTER — Ambulatory Visit: Payer: Self-pay | Admitting: Family Medicine

## 2015-03-09 ENCOUNTER — Encounter: Payer: Self-pay | Admitting: Family Medicine

## 2015-03-09 ENCOUNTER — Ambulatory Visit (INDEPENDENT_AMBULATORY_CARE_PROVIDER_SITE_OTHER): Payer: Medicare Other | Admitting: Family Medicine

## 2015-03-09 DIAGNOSIS — I341 Nonrheumatic mitral (valve) prolapse: Secondary | ICD-10-CM | POA: Insufficient documentation

## 2015-03-09 DIAGNOSIS — I119 Hypertensive heart disease without heart failure: Secondary | ICD-10-CM | POA: Insufficient documentation

## 2015-03-09 DIAGNOSIS — N3941 Urge incontinence: Secondary | ICD-10-CM | POA: Insufficient documentation

## 2015-03-09 DIAGNOSIS — M810 Age-related osteoporosis without current pathological fracture: Secondary | ICD-10-CM | POA: Insufficient documentation

## 2015-03-09 DIAGNOSIS — G8929 Other chronic pain: Secondary | ICD-10-CM

## 2015-03-09 DIAGNOSIS — G43909 Migraine, unspecified, not intractable, without status migrainosus: Secondary | ICD-10-CM | POA: Insufficient documentation

## 2015-03-09 MED ORDER — OXYCODONE HCL ER 20 MG PO T12A: 20.0000 mg | EXTENDED_RELEASE_TABLET | Freq: Four times a day (QID) | ORAL | Status: DC

## 2015-03-09 MED ORDER — OXYCODONE HCL ER 20 MG PO T12A: 20.0000 mg | EXTENDED_RELEASE_TABLET | Freq: Three times a day (TID) | ORAL | Status: DC

## 2015-03-09 MED ORDER — OXYCODONE HCL 5 MG PO TABS
5.0000 mg | ORAL_TABLET | ORAL | Status: DC | PRN
Start: 2015-03-09 — End: 2015-05-07

## 2015-03-09 MED ORDER — OXYCODONE HCL 20 MG PO TABS: 20.0000 mg | ORAL_TABLET | Freq: Four times a day (QID) | ORAL | Status: DC

## 2015-03-09 MED ORDER — OXYCODONE-ACETAMINOPHEN 10-325 MG PO TABS: 1.0000 | ORAL_TABLET | Freq: Three times a day (TID) | ORAL | Status: DC | PRN

## 2015-03-09 NOTE — Progress Notes (Signed)
Name: Erica Stevens   MRN: [id number]    DOB: [birth year]   Date:03/09/2015       Progress Note  Subjective  Chief Complaint  Chief Complaint  Patient presents with  . Back Pain  . Pain    2 month follow up    Back Pain This is a chronic problem. The current episode started more than 1 year ago. The problem occurs constantly. The problem is unchanged. The pain is present in the lumbar spine. The pain is moderate. The pain is worse during the day. The symptoms are aggravated by bending, coughing, position, stress and twisting. Associated symptoms include leg pain. Pertinent negatives include no chest pain, dysuria, fever, headaches, tingling, weakness or weight loss. Risk factors include menopause and sedentary lifestyle. The treatment provided moderate relief.  Neck Pain  This is a chronic problem. The current episode started more than 1 year ago. The problem occurs constantly. The problem has been unchanged. The pain is present in the left side, midline and right side. The quality of the pain is described as aching. The pain is moderate. The pain is worse during the day. Associated symptoms include leg pain. Pertinent negatives include no chest pain, fever, headaches, tingling, weakness or weight loss. She has tried oral narcotics for the symptoms. The treatment provided moderate relief.      Past Medical History  Diagnosis Date  . Degeneration of cervical intervertebral disc   . Mitral valve disorders   . Migraine, unspecified, without mention of intractable migraine without mention of status migrainosus   . Congestive heart failure, unspecified   . Thoracic or lumbosacral neuritis or radiculitis, unspecified   . Brachial neuritis or radiculitis NOS   . Hypertension   . Pure hypercholesterolemia   . Coronary atherosclerosis of unspecified type of vessel, native or graft   . Unspecified urinary incontinence   . Allergic rhinitis due to pollen   . Osteoporosis, unspecified   .  Reflux esophagitis   . Chronic kidney disease, stage I   . Chronic pain syndrome   . Cellulitis and abscess of unspecified site     History  Substance Use Topics  . Smoking status: Former Smoker -- 1.00 packs/day for 20 years    Types: Cigarettes  . Smokeless tobacco: Not on file  . Alcohol Use: No     Current outpatient prescriptions:  .  aspirin 81 MG tablet, Take 81 mg by mouth daily., Disp: , Rfl:  .  enalapril (VASOTEC) 5 MG tablet, Take 5 mg by mouth 2 (two) times daily., Disp: , Rfl:  .  furosemide (LASIX) 40 MG tablet, Takes 1/2 to 1 tablet daily., Disp: , Rfl:  .  isosorbide mononitrate (IMDUR) 30 MG 24 hr tablet, TAKE ONE (1) TABLET EACH DAY, Disp: 90 tablet, Rfl: 0 .  metoprolol succinate (TOPROL-XL) 25 MG 24 hr tablet, Take 25 mg by mouth daily., Disp: , Rfl:  .  montelukast (SINGULAIR) 10 MG tablet, Take 10 mg by mouth at bedtime., Disp: , Rfl:  .  nitroGLYCERIN (NITROLINGUAL) 0.4 MG/SPRAY spray, Place 1 spray under the tongue every 5 (five) minutes as needed for chest pain., Disp: , Rfl:  .  omega-3 acid ethyl esters (LOVAZA) 1 G capsule, Take 2 g by mouth 2 (two) times daily., Disp: , Rfl:  .  OxyCODONE (OXYCONTIN) 20 mg T12A 12 hr tablet, Take 20 mg by mouth every 8 (eight) hours., Disp: , Rfl:  .  potassium chloride SA (K-DUR,KLOR-CON) 20 MEQ  tablet, Take 20 mEq by mouth daily., Disp: , Rfl:  .  pravastatin (PRAVACHOL) 40 MG tablet, Take 40 mg by mouth 2 (two) times daily., Disp: , Rfl:  .  tiZANidine (ZANAFLEX) 4 MG tablet, Take 4 mg by mouth 2 (two) times daily., Disp: , Rfl:  .  tolterodine (DETROL) 2 MG tablet, Take 2 mg by mouth 2 (two) times daily., Disp: , Rfl:  .  venlafaxine (EFFEXOR) 75 MG tablet, Takes 1 tablet am, 2 tablets in the evening daily., Disp: , Rfl:   Allergies  Allergen Reactions  . Erythromycin   . Ivp Dye [Iodinated Diagnostic Agents]   . Sulfur     Review of Systems  Constitutional: Negative for fever, chills and weight loss.  HENT:  Negative for congestion, hearing loss, sore throat and tinnitus.   Eyes: Negative for blurred vision, double vision and redness.  Respiratory: Negative for cough, hemoptysis and shortness of breath.   Cardiovascular: Negative for chest pain, palpitations, orthopnea, claudication and leg swelling.  Gastrointestinal: Negative for heartburn, nausea, vomiting, diarrhea, constipation and blood in stool.  Genitourinary: Negative for dysuria, urgency, frequency and hematuria.  Musculoskeletal: Positive for myalgias, back pain, joint pain and neck pain.  Skin: Negative for itching.  Neurological: Positive for sensory change. Negative for dizziness, tingling, tremors, focal weakness, seizures, loss of consciousness, weakness and headaches.  Endo/Heme/Allergies: Does not bruise/bleed easily.  Psychiatric/Behavioral: Positive for depression. Negative for suicidal ideas and substance abuse. The patient is not nervous/anxious and does not have insomnia.      Objective  Filed Vitals:   03/09/15 1442  BP: 120/76  Pulse: 92  Temp: 98.1 F (36.7 C)  TempSrc: Oral  Resp: 16  Height: 5' (1.524 m)  Weight: 134 lb 3.2 oz (60.873 kg)  SpO2: 93%     Physical Exam  Constitutional: She is oriented to person, place, and time and well-developed, well-nourished, and in no distress.  HENT:  Head: Normocephalic.  Eyes: EOM are normal. Pupils are equal, round, and reactive to light.  Neck: Normal range of motion. No thyromegaly present.  Cardiovascular: Normal rate, regular rhythm and normal heart sounds.   No murmur heard. Pulmonary/Chest: Effort normal and breath sounds normal.  Abdominal: Soft. Bowel sounds are normal.  Musculoskeletal: She exhibits tenderness. She exhibits no edema.  Markedly tender in cervical, trapezius , areas.    ROM LIMITED TO 30 DEGREES  Neurological: She is alert and oriented to person, place, and time. No cranial nerve deficit. Gait normal.  Skin: Skin is warm and dry. No  rash noted.  Psychiatric: Memory and affect normal.      Assessment & Plan  1. Chronic pain Worse recently - oxyCODONE (ROXICODONE) 5 MG immediate release tablet; Take 1 tablet (5 mg total) by mouth every 4 (four) hours as needed for severe pain.  Dispense: 30 tablet; Refill: 0   Review of

## 2015-03-09 NOTE — Addendum Note (Signed)
Addended by: Dennison Mascot on: 03/09/2015 03:40 PM   Modules accepted: Orders

## 2015-03-09 NOTE — Patient Instructions (Signed)
F/u in 3 mo

## 2015-03-09 NOTE — Addendum Note (Signed)
Addended by: Dennison MascotMORRISEY, Chanah Tidmore on: 03/09/2015 04:53 PM   Modules accepted: Orders

## 2015-03-18 ENCOUNTER — Other Ambulatory Visit: Payer: Self-pay | Admitting: Family Medicine

## 2015-03-18 ENCOUNTER — Other Ambulatory Visit: Payer: Self-pay | Admitting: Cardiovascular Disease

## 2015-03-30 ENCOUNTER — Encounter: Payer: Self-pay | Admitting: Family Medicine

## 2015-04-06 ENCOUNTER — Telehealth: Payer: Self-pay | Admitting: Family Medicine

## 2015-04-06 NOTE — Telephone Encounter (Signed)
Pt is requesting a corrected prescription for oxycodone. It has a refill date on 04-08-15

## 2015-04-07 MED ORDER — OXYCODONE HCL 20 MG PO TABS: 20.0000 mg | ORAL_TABLET | Freq: Four times a day (QID) | ORAL | Status: DC

## 2015-04-07 NOTE — Telephone Encounter (Signed)
Patient is to come by and pick up new script on April 08, 2015 for Oxycodone #124 every 6 hours

## 2015-04-08 NOTE — Telephone Encounter (Signed)
Patient picked up this prescription on 04-07-15. Thank you.

## 2015-05-07 ENCOUNTER — Encounter: Payer: Self-pay | Admitting: Family Medicine

## 2015-05-07 ENCOUNTER — Ambulatory Visit (INDEPENDENT_AMBULATORY_CARE_PROVIDER_SITE_OTHER): Payer: Medicare Other | Admitting: Family Medicine

## 2015-05-07 VITALS — BP 130/76 | HR 89 | Temp 97.9°F | Resp 16 | Ht 60.0 in | Wt 129.9 lb

## 2015-05-07 DIAGNOSIS — E785 Hyperlipidemia, unspecified: Secondary | ICD-10-CM

## 2015-05-07 DIAGNOSIS — I341 Nonrheumatic mitral (valve) prolapse: Secondary | ICD-10-CM

## 2015-05-07 DIAGNOSIS — I5020 Unspecified systolic (congestive) heart failure: Secondary | ICD-10-CM

## 2015-05-07 DIAGNOSIS — Z23 Encounter for immunization: Secondary | ICD-10-CM | POA: Diagnosis not present

## 2015-05-07 DIAGNOSIS — I25709 Atherosclerosis of coronary artery bypass graft(s), unspecified, with unspecified angina pectoris: Secondary | ICD-10-CM | POA: Diagnosis not present

## 2015-05-07 DIAGNOSIS — I119 Hypertensive heart disease without heart failure: Secondary | ICD-10-CM

## 2015-05-07 DIAGNOSIS — G8929 Other chronic pain: Secondary | ICD-10-CM | POA: Diagnosis not present

## 2015-05-07 MED ORDER — OXYCODONE HCL 20 MG PO TABS: 20.0000 mg | ORAL_TABLET | Freq: Four times a day (QID) | ORAL | Status: DC

## 2015-05-07 MED ORDER — OXYCODONE HCL 5 MG PO TABS: 5.0000 mg | ORAL_TABLET | ORAL | Status: DC | PRN

## 2015-05-07 NOTE — Addendum Note (Signed)
Addended by: Dennison Mascot on: 05/07/2015 11:39 AM   Modules accepted: Orders

## 2015-05-07 NOTE — Progress Notes (Signed)
Name: Erica Stevens   MRN: [id number]    DOB: [birth year]   Date:05/07/2015       Progress Note  Subjective  Chief Complaint  Chief Complaint  Patient presents with  . Back Pain    2 month refill Chronic Pain meds  . Neck Pain    HPI  Chronic pain.  Patient presents for chronic pain follow-up. It is exacerbated by weather changes by stressors or by strenuous activities. She currently is on a regimen of oxycodone 20 mg 4 times daily with oxycodone 5 mg on a when necessary basis for breakthrough pain as well. Current pain level at the worse is 9 glasses 4-5 with medication  Coronary artery disease  Patient has a history of CAD and history of CHF and mitral valve disorder. She hasn't seen a cardiologist in quite a while and has not had lab work done recently. She is currently on furosemide metoprolol aspirin and enalapril and sublingual nitroglycerin when necessary which has not been used recently.  Hyperlipidemia  Patient currently on pravastatin 40 mg daily. She has not had a lipid panel done a number of years and is not fasting on today. Currently there is no problem with any myalgias or nausea or vomiting associated.  CHF and ASCVD  Currently no chest pains palpitations, PND or lower extremity swelling.  Chronic pain Past Medical History  Diagnosis Date  . Degeneration of cervical intervertebral disc   . Mitral valve disorders   . Migraine, unspecified, without mention of intractable migraine without mention of status migrainosus   . Congestive heart failure, unspecified   . Thoracic or lumbosacral neuritis or radiculitis, unspecified   . Brachial neuritis or radiculitis NOS   . Hypertension   . Pure hypercholesterolemia   . Coronary atherosclerosis of unspecified type of vessel, native or graft   . Unspecified urinary incontinence   . Allergic rhinitis due to pollen   . Osteoporosis, unspecified   . Reflux esophagitis   . Chronic kidney disease, stage I   .  Chronic pain syndrome   . Cellulitis and abscess of unspecified site     Social History  Substance Use Topics  . Smoking status: Former Smoker -- 1.00 packs/day for 20 years    Types: Cigarettes  . Smokeless tobacco: Not on file  . Alcohol Use: No     Current outpatient prescriptions:  .  aspirin 81 MG tablet, Take 81 mg by mouth daily., Disp: , Rfl:  .  enalapril (VASOTEC) 5 MG tablet, Take 5 mg by mouth 2 (two) times daily., Disp: , Rfl:  .  furosemide (LASIX) 40 MG tablet, TAKE 1/2 TO 1 TABLET DAILY AS DIRECTED., Disp: 30 tablet, Rfl: 5 .  isosorbide mononitrate (IMDUR) 30 MG 24 hr tablet, TAKE ONE (1) TABLET EACH DAY, Disp: 90 tablet, Rfl: 0 .  metoprolol succinate (TOPROL-XL) 25 MG 24 hr tablet, TAKE ONE (1) TABLET EACH DAY, Disp: 30 tablet, Rfl: 2 .  montelukast (SINGULAIR) 10 MG tablet, Take 10 mg by mouth at bedtime., Disp: , Rfl:  .  nitroGLYCERIN (NITROLINGUAL) 0.4 MG/SPRAY spray, Place 1 spray under the tongue every 5 (five) minutes as needed for chest pain., Disp: , Rfl:  .  omega-3 acid ethyl esters (LOVAZA) 1 G capsule, Take 2 g by mouth 2 (two) times daily., Disp: , Rfl:  .  OxyCODONE (OXYCONTIN) 20 mg T12A 12 hr tablet, Take 1 tablet (20 mg total) by mouth 4 (four) times daily., Disp: 124 tablet, Rfl:  0 .  oxyCODONE (ROXICODONE) 5 MG immediate release tablet, Take 1 tablet (5 mg total) by mouth every 4 (four) hours as needed for severe pain., Disp: 30 tablet, Rfl: 0 .  Oxycodone HCl 20 MG TABS, Take 1 tablet (20 mg total) by mouth 4 (four) times daily., Disp: 124 tablet, Rfl: 0 .  oxyCODONE-acetaminophen (PERCOCET) 10-325 MG per tablet, Take 1 tablet by mouth every 8 (eight) hours as needed for pain., Disp: 20 tablet, Rfl: 0 .  potassium chloride SA (K-DUR,KLOR-CON) 20 MEQ tablet, Take 20 mEq by mouth daily., Disp: , Rfl:  .  pravastatin (PRAVACHOL) 40 MG tablet, Take 40 mg by mouth 2 (two) times daily., Disp: , Rfl:  .  tiZANidine (ZANAFLEX) 4 MG tablet, Take 4 mg by  mouth 2 (two) times daily., Disp: , Rfl:  .  tolterodine (DETROL) 2 MG tablet, Take 2 mg by mouth 2 (two) times daily., Disp: , Rfl:  .  venlafaxine (EFFEXOR) 75 MG tablet, Takes 1 tablet am, 2 tablets in the evening daily., Disp: , Rfl:   Allergies  Allergen Reactions  . Erythromycin   . Ivp Dye [Iodinated Diagnostic Agents]   . Sulfur     Review of Systems  Constitutional: Negative for fever, chills and weight loss.  HENT: Negative for congestion, hearing loss, sore throat and tinnitus.   Eyes: Negative for blurred vision, double vision and redness.  Respiratory: Negative for cough, hemoptysis and shortness of breath.   Cardiovascular: Negative for chest pain, palpitations, orthopnea, claudication and leg swelling.  Gastrointestinal: Negative for heartburn, nausea, vomiting, diarrhea, constipation and blood in stool.  Genitourinary: Negative for dysuria, urgency, frequency and hematuria.  Musculoskeletal: Positive for back pain, joint pain and neck pain. Negative for myalgias and falls.  Skin: Negative for itching.  Neurological: Negative for dizziness, tingling, tremors, focal weakness, seizures, loss of consciousness, weakness and headaches.  Endo/Heme/Allergies: Does not bruise/bleed easily.  Psychiatric/Behavioral: Negative for depression and substance abuse. The patient has insomnia. The patient is not nervous/anxious.      Objective  Filed Vitals:   05/07/15 1103  BP: 130/76  Pulse: 89  Temp: 97.9 F (36.6 C)  TempSrc: Oral  Resp: 16  Height: 5' (1.524 m)  Weight: 129 lb 14.4 oz (58.922 kg)  SpO2: 98%     Physical Exam  Constitutional: She is oriented to person, place, and time and well-developed, well-nourished, and in no distress.  HENT:  Head: Normocephalic.  Eyes: EOM are normal. Pupils are equal, round, and reactive to light.  Neck: Normal range of motion. No thyromegaly present.  Cardiovascular: Normal rate and regular rhythm.   Murmur (grade 2 systolic  murmur in the mitral area) heard. Pulmonary/Chest: Effort normal and breath sounds normal.  Abdominal: Soft. Bowel sounds are normal.  Musculoskeletal: She exhibits tenderness (market tenderness on the trapezius sternal mastoid and subscapular area greater on the right and left. Lateral rotation C-spine limited to about 30 and flexion to about 45 secondary to pain.). She exhibits no edema.  Neurological: She is alert and oriented to person, place, and time. No cranial nerve deficit. Gait normal.  Skin: Skin is warm and dry. No rash noted.  Psychiatric: Memory and affect normal.      Assessment & Plan  1. Chronic pain  - oxyCODONE (ROXICODONE) 5 MG immediate release tablet; Take 1 tablet (5 mg total) by mouth every 4 (four) hours as needed for severe pain.  Dispense: 30 tablet; Refill: 0  2. Benign hypertensive heart  disease, without heart failure   3. Billowing mitral valve   4. Influenza vaccination administered at current visit  - Flu Vaccine QUAD 36+ mos PF IM (Fluarix & Fluzone Quad PF)  5. Coronary artery disease involving coronary bypass graft with unspecified angina pectoris   6. Systolic congestive heart failure, unspecified congestive heart failure chronicity Stable  7. Hyperlipidemia Labs are returned visit

## 2015-07-02 ENCOUNTER — Encounter: Payer: Self-pay | Admitting: Family Medicine

## 2015-07-02 ENCOUNTER — Ambulatory Visit: Payer: Medicare Other | Admitting: Family Medicine

## 2015-07-02 ENCOUNTER — Ambulatory Visit (INDEPENDENT_AMBULATORY_CARE_PROVIDER_SITE_OTHER): Payer: Commercial Managed Care - HMO | Admitting: Family Medicine

## 2015-07-02 VITALS — BP 130/80 | HR 84 | Temp 98.7°F | Resp 16 | Ht 60.0 in | Wt 133.3 lb

## 2015-07-02 DIAGNOSIS — G8929 Other chronic pain: Secondary | ICD-10-CM | POA: Diagnosis not present

## 2015-07-02 DIAGNOSIS — R238 Other skin changes: Secondary | ICD-10-CM

## 2015-07-02 DIAGNOSIS — L989 Disorder of the skin and subcutaneous tissue, unspecified: Secondary | ICD-10-CM

## 2015-07-02 DIAGNOSIS — M5090 Cervical disc disorder, unspecified, unspecified cervical region: Secondary | ICD-10-CM | POA: Diagnosis not present

## 2015-07-02 DIAGNOSIS — F329 Major depressive disorder, single episode, unspecified: Secondary | ICD-10-CM | POA: Insufficient documentation

## 2015-07-02 DIAGNOSIS — I110 Hypertensive heart disease with heart failure: Secondary | ICD-10-CM | POA: Insufficient documentation

## 2015-07-02 DIAGNOSIS — R233 Spontaneous ecchymoses: Secondary | ICD-10-CM

## 2015-07-02 MED ORDER — OXYCODONE HCL 5 MG PO TABS: 5.0000 mg | ORAL_TABLET | ORAL | Status: DC | PRN

## 2015-07-02 MED ORDER — OXYCODONE HCL 20 MG PO TABS: 20.0000 mg | ORAL_TABLET | Freq: Four times a day (QID) | ORAL | Status: DC

## 2015-07-02 NOTE — Progress Notes (Signed)
Name: Erica Stevens   MRN: [id number]    DOB: 1951/03/03   Date:07/02/2015       Progress Note  Subjective  Chief Complaint  Chief Complaint  Patient presents with  . Back Pain    2 month follow up Chronic Meds  . Neck Pain    HPI   Chronic pain  Patient presents for follow-up of chronic pain syndrome. The pain score at worse is 10/10  and at best is 8/10 we will start medication rest and home remedies.  There is no evidence of any extra dosage or issues of usage outside of the prescribed regimen.  Pain is exacerbated by changes in weather and by strenuous activities. There have been no recent activities to exacerbate the chronic pain. Concomitant medications include tizanidine along with oxycodone and Percocet  .  Drug screen and medication contract have been renewed within the last 1 year.    Cervical disc disease  Patient continues with significant pain in his cervical area radiating to the shoulders neck and arm. She was involved the last some years ago. Pain is very intense comfortable position particularly to lie down at night.   easy bruising  Patient increasing bruising on the extremities and trauma. The gingival bleeding or hematuria.  Skin lesions  Patient has a long-standing history of freckles and states lesions. And then more frequent and some raise on the right forearm this time.  Diffuse areas of freckles and hyperpigmentation with some areas are irregular and slightly raise on the right forearm.     Past Medical History  Diagnosis Date  . Degeneration of cervical intervertebral disc   . Mitral valve disorders   . Migraine, unspecified, without mention of intractable migraine without mention of status migrainosus   . Congestive heart failure, unspecified   . Thoracic or lumbosacral neuritis or radiculitis, unspecified   . Brachial neuritis or radiculitis NOS   . Hypertension   . Pure hypercholesterolemia   . Coronary atherosclerosis of unspecified  type of vessel, native or graft   . Unspecified urinary incontinence   . Allergic rhinitis due to pollen   . Osteoporosis, unspecified   . Reflux esophagitis   . Chronic kidney disease, stage I   . Chronic pain syndrome   . Cellulitis and abscess of unspecified site     Social History  Substance Use Topics  . Smoking status: Former Smoker -- 1.00 packs/day for 20 years    Types: Cigarettes  . Smokeless tobacco: Not on file  . Alcohol Use: No     Current outpatient prescriptions:  .  aspirin 81 MG tablet, Take 81 mg by mouth daily., Disp: , Rfl:  .  enalapril (VASOTEC) 5 MG tablet, Take 5 mg by mouth 2 (two) times daily., Disp: , Rfl:  .  furosemide (LASIX) 40 MG tablet, TAKE 1/2 TO 1 TABLET DAILY AS DIRECTED., Disp: 30 tablet, Rfl: 5 .  isosorbide mononitrate (IMDUR) 30 MG 24 hr tablet, TAKE ONE (1) TABLET EACH DAY, Disp: 90 tablet, Rfl: 0 .  metoprolol succinate (TOPROL-XL) 25 MG 24 hr tablet, TAKE ONE (1) TABLET EACH DAY, Disp: 30 tablet, Rfl: 2 .  montelukast (SINGULAIR) 10 MG tablet, Take 10 mg by mouth at bedtime., Disp: , Rfl:  .  nitroGLYCERIN (NITROLINGUAL) 0.4 MG/SPRAY spray, Place 1 spray under the tongue every 5 (five) minutes as needed for chest pain., Disp: , Rfl:  .  omega-3 acid ethyl esters (LOVAZA) 1 G capsule, Take 2 g by  mouth 2 (two) times daily., Disp: , Rfl:  .  oxyCODONE (ROXICODONE) 5 MG immediate release tablet, Take 1 tablet (5 mg total) by mouth every 4 (four) hours as needed for severe pain., Disp: 30 tablet, Rfl: 0 .  [START ON 08/02/2015] oxyCODONE (ROXICODONE) 5 MG immediate release tablet, Take 1 tablet (5 mg total) by mouth every 4 (four) hours as needed for severe pain. Refill on 08/02/2015, Disp: 30 tablet, Rfl: 0 .  oxyCODONE (ROXICODONE) 5 MG immediate release tablet, Take 1 tablet (5 mg total) by mouth every 4 (four) hours as needed for severe pain., Disp: 30 tablet, Rfl: 0 .  [START ON 09/01/2015] Oxycodone HCl 20 MG TABS, Take 1 tablet (20 mg  total) by mouth 4 (four) times daily. Refill on 09/01/2015, Disp: 124 tablet, Rfl: 0 .  potassium chloride SA (K-DUR,KLOR-CON) 20 MEQ tablet, Take 20 mEq by mouth daily., Disp: , Rfl:  .  pravastatin (PRAVACHOL) 40 MG tablet, Take 40 mg by mouth 2 (two) times daily., Disp: , Rfl:  .  tiZANidine (ZANAFLEX) 4 MG tablet, Take 4 mg by mouth 2 (two) times daily., Disp: , Rfl:  .  tolterodine (DETROL) 2 MG tablet, Take 2 mg by mouth 2 (two) times daily., Disp: , Rfl:  .  venlafaxine (EFFEXOR) 75 MG tablet, Takes 1 tablet am, 2 tablets in the evening daily., Disp: , Rfl:   Allergies  Allergen Reactions  . Erythromycin   . Ivp Dye [Iodinated Diagnostic Agents]   . Sulfur     Review of Systems  Constitutional: Negative for fever, chills and weight loss.  HENT: Negative for congestion, hearing loss, sore throat and tinnitus.   Eyes: Negative for blurred vision, double vision and redness.  Respiratory: Negative for cough, hemoptysis and shortness of breath.   Cardiovascular: Negative for chest pain, palpitations, orthopnea, claudication and leg swelling.  Gastrointestinal: Negative for heartburn, nausea, vomiting, diarrhea, constipation and blood in stool.  Genitourinary: Negative for dysuria, urgency, frequency and hematuria.  Musculoskeletal: Positive for back pain, joint pain and neck pain. Negative for myalgias and falls.  Skin: Positive for rash. Negative for itching.  Neurological: Positive for headaches. Negative for dizziness, tingling, tremors, focal weakness, seizures, loss of consciousness and weakness.  Endo/Heme/Allergies: Does not bruise/bleed easily.  Psychiatric/Behavioral: Positive for depression. Negative for substance abuse. The patient is nervous/anxious. The patient does not have insomnia.      Objective  Filed Vitals:   07/02/15 1049  BP: 130/80  Pulse: 84  Temp: 98.7 F (37.1 C)  TempSrc: Oral  Resp: 16  Height: 5' (1.524 m)  Weight: 133 lb 4.8 oz (60.464 kg)   SpO2: 98%     Physical Exam  Constitutional: She is oriented to person, place, and time.  Patient about distress movement  HENT:  Head: Normocephalic.  Eyes: EOM are normal. Pupils are equal, round, and reactive to light.  Neck: Normal range of motion. No thyromegaly present.  Cardiovascular: Normal rate, regular rhythm and normal heart sounds.   No murmur heard. Pulmonary/Chest: Effort normal and breath sounds normal.  Musculoskeletal: She exhibits tenderness ( in both cervical and lumbar areas). She exhibits no edema.  Neurological: She is alert and oriented to person, place, and time. No cranial nerve deficit. Gait normal.  Skin: No rash noted.  Diffuse freckles. There are some which are hyperreflexic pigmented and slightly raise on the right forearm as well. There is also some mild bruises noted on  Psychiatric: Memory and affect normal.  Assessment & Plan  1. Chronic pain Continue to person - oxyCODONE (ROXICODONE) 5 MG immediate release tablet; Take 1 tablet (5 mg total) by mouth every 4 (four) hours as needed for severe pain.  Dispense: 30 tablet; Refill: 0 - oxyCODONE (ROXICODONE) 5 MG immediate release tablet; Take 1 tablet (5 mg total) by mouth every 4 (four) hours as needed for severe pain. Refill on 08/02/2015  Dispense: 30 tablet; Refill: 0 - oxyCODONE (ROXICODONE) 5 MG immediate release tablet; Take 1 tablet (5 mg total) by mouth every 4 (four) hours as needed for severe pain.  Dispense: 30 tablet; Refill: 0  2. Cervical disc disease **Worsening with weather changes - oxyCODONE (ROXICODONE) 5 MG immediate release tablet; Take 1 tablet (5 mg total) by mouth every 4 (four) hours as needed for severe pain.  Dispense: 30 tablet; Refill: 0 - oxyCODONE (ROXICODONE) 5 MG immediate release tablet; Take 1 tablet (5 mg total) by mouth every 4 (four) hours as needed for severe pain. Refill on 08/02/2015  Dispense: 30 tablet; Refill: 0 - Oxycodone HCl 20 MG TABS; Take 1  tablet (20 mg total) by mouth 4 (four) times daily. Refill on 09/01/2015  Dispense: 124 tablet; Refill: 0 - oxyCODONE (ROXICODONE) 5 MG immediate release tablet; Take 1 tablet (5 mg total) by mouth every 4 (four) hours as needed for severe pain.  Dispense: 30 tablet; Refill: 0  3. Skin lesions, generalized Dermatology referral on return visit  4. 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

## 2015-07-02 NOTE — Patient Outreach (Signed)
[facility] Care Management  07/02/2015  Lamar LaundrySusan Gray [place] 1950-11-13 [id number]   Referral from [employer] Tier 2 List, assigned Gean MaidensFrances Pleasant, RN to outreach for [facility].  Thanks, Corrie MckusickLisa O. Sharlee BlewMoore, [facility]ABA [facility] CM Assistant Phone: [phone] Fax: [phone]

## 2015-07-03 ENCOUNTER — Other Ambulatory Visit: Payer: Self-pay | Admitting: *Deleted

## 2015-07-03 NOTE — Patient Outreach (Signed)
[facility] Care Management  07/03/2015  Lamar LaundrySusan Gray [place] 1951/03/05 [id number]   RN Health Coach attempted 1st  outreach call to Tier 2 patient to discuss services of [facility]. Patient was unavailable. HIPPA compliance voicemail message was left with return callback number.   Gean MaidensFrances Beni Turrell BSN RN [facility] Care Management [phone]

## 2015-07-04 ENCOUNTER — Other Ambulatory Visit: Payer: Self-pay | Admitting: Family Medicine

## 2015-07-04 ENCOUNTER — Other Ambulatory Visit: Payer: Self-pay | Admitting: Cardiovascular Disease

## 2015-07-07 ENCOUNTER — Other Ambulatory Visit: Payer: Self-pay

## 2015-07-07 MED ORDER — ISOSORBIDE MONONITRATE ER 30 MG PO TB24: ORAL_TABLET | ORAL | Status: DC

## 2015-07-10 ENCOUNTER — Other Ambulatory Visit: Payer: Self-pay | Admitting: *Deleted

## 2015-07-10 DIAGNOSIS — I5042 Chronic combined systolic (congestive) and diastolic (congestive) heart failure: Secondary | ICD-10-CM

## 2015-07-10 DIAGNOSIS — I5020 Unspecified systolic (congestive) heart failure: Secondary | ICD-10-CM

## 2015-07-10 NOTE — Patient Outreach (Signed)
[facility] Care Management  07/10/2015  Lamar LaundrySusan Gray Jans Jun 19, 1951 [id number]   RN Health Coach attempted 2nd outreach call to Tier 2 patient to discuss services of [facility]. Patient was unavailable. HIPPA compliance voicemail message was left with return callback number.   Gean MaidensFrances Hernan Turnage BSN RN [facility] Care Management [phone]

## 2015-07-10 NOTE — Patient Outreach (Signed)
[facility](THN) Care Management  07/10/2015  Erica Stevens 1951-04-05 [id number]   Return telephone call from patient to Telephonic Health Coach. This is a tier 2 screening. Hipaa compliance verified. This patient is accepting the [facility] services. This patient has a diagnosis of CHF. She is not aware of the zones and action plan. Per patient she did graduate from high school. She does not drive and her mother is over 65 years old that takes her back and forth from the physician when she can. Per patient she needs help with transportation to and from physician visits.  Patient is willing to talk with social worker for financial review and telephonic health coach for educational information on congestive heart failure. Per patient she has had the flu shot this year and it is  noted in [facility] notes. Per patient with the medicare and medicaid benefits she is able to afford her medications. Patient has agreed to follow up outreach.  Assessment: This patient would benefit from Health Coach telephonic outreach for education and support on congestive heart failure  Plan: Referral to Health Coach for education Referral to [employer] Social Worker for evaluation of transportation needs to and from physician office.  Gean MaidensFrances Bali Stevens BSN RN [facility] Management [phone]

## 2015-07-10 NOTE — Patient Outreach (Signed)
[facility](THN) Care Management  07/10/2015  [facility] 1950-12-14 [id number]   Notification from Gean MaidensFrances Pleasant, RN to assign SW, assigned Toll BrothersChrystal Land, [place]CS[place].  Thanks, Corrie MckusickLisa O. Sharlee BlewMoore, AABA [facility] Care [facility] CM Assistant Phone: [phone] Fax: [phone]

## 2015-07-17 ENCOUNTER — Other Ambulatory Visit: Payer: Self-pay | Admitting: *Deleted

## 2015-07-17 NOTE — Patient Outreach (Signed)
[facility](THN) Care Management  07/17/2015  Erica LaundrySusan Gray Stevens 05/27/1951 [id number]   Phone call to patient to assess for community resource needs.  HIPPA compliant voicemail left for a return call.   Adriana ReamsChrystal Mahamud Metts, LCSW [facility] Care Management [phone]

## 2015-07-17 NOTE — Patient Outreach (Signed)
[facility](THN) Care Management  07/17/2015  Erica ArbourSusan Stevens Erica Stevens 10-21-1950 [id number]   RN CM attempted  Follow up outreach call to patient.  Patient was unavailable. HIPPA compliance voicemail message was left with return callback number.    Gean MaidensFrances Hanan Mcwilliams BSN RN [facility] Care Management [phone]

## 2015-07-21 ENCOUNTER — Other Ambulatory Visit: Payer: Self-pay | Admitting: *Deleted

## 2015-07-21 ENCOUNTER — Ambulatory Visit: Payer: Medicaid Other | Admitting: *Deleted

## 2015-07-21 NOTE — Patient Outreach (Signed)
[facility](THN) Care Management  07/21/2015  Beather ArbourSusan Gray [facility] 1951/03/15 [id number]   RN CM attempted  Follow up outreach call to patient.  Patient was unavailable. HIPPA compliance message was left with daughter and return callback number.    Gean MaidensFrances Kaelin Bonelli BSN RN [facility] [phone]

## 2015-07-22 ENCOUNTER — Other Ambulatory Visit: Payer: Self-pay | Admitting: *Deleted

## 2015-07-22 ENCOUNTER — Encounter: Payer: Self-pay | Admitting: *Deleted

## 2015-07-22 NOTE — Progress Notes (Signed)
This encounter was created in error - please disregard.

## 2015-07-23 ENCOUNTER — Encounter: Payer: Self-pay | Admitting: *Deleted

## 2015-07-23 ENCOUNTER — Other Ambulatory Visit: Payer: Self-pay | Admitting: *Deleted

## 2015-07-23 DIAGNOSIS — I5020 Unspecified systolic (congestive) heart failure: Secondary | ICD-10-CM

## 2015-07-23 NOTE — Patient Outreach (Signed)
[facility](THN) Care Management  07/23/2015  Erica LaundrySusan Gray Stevens 12/29/1950 [id number]   Request from Gean MaidensFrances Pleasant, RN to assign Pharmacy, assigned Duanne MoronElisabeth Dhalla, PharmD.  Thanks, Corrie MckusickLisa O. Sharlee BlewMoore, AABA [facility] Care Management [facility] CM Assistant Phone: [phone] Fax: [phone]

## 2015-07-23 NOTE — Patient Outreach (Signed)
[facility](THN) Care Management  07/23/2015  Erica Stevens [birth year] [id number]  RN Health Coach  telephone call to patient.  Hipaa compliance verified.  Per patient she does not have a scale to be able to weigh herself. Per patient she uses a cane. Per patient she has a lot of back pain .Per patient she is not aware of what zones are related to congestive heart failure. Patient stated she has not had a dental exam in a very long time. Per patient she has false teeth.  Per patient she has never seen a podiatrist. Per patient it has been several years since she has had an eye exam. Patient has agreed to follow up outreach calls.  Assessment: This patient would benefit from Health Coach telephonic outreach for education and support for congestive heart failure self management. Patient does not have a scale to weigh on Patient does not have knowledge for self management of congestive heart failure  Plan: RN Health Coach will provide ongoing education for the patient on congestive heart failure through phone calls and sending printed information to patient for further discussion. RN Health Coach will send a welcome packet with consent. RN Health Coach will send initial barriers letter, assessment and care plan to primary care physician.  RN Health Coach will contact patient within a month for follow up outreach. RN Health Coach will refer to social worker to assist in providing a scale for weight monitoring RN Health Coach will send patient a calendar booklet RN Health Coach Sent EMMI on Heart Failure Understanding Water Pills and Thirst RN Health Coach Sent EMMI on Keeping Track of your weight each day RN Health Coach Sent EMMI on  How to [employer] Smart RN Health Coach Sent EMMI on  Rest, Exercise, and Blood Pressure RN Health Coach Sent EMMI on  When to call your Doctor or 911 RN Health Coach Sent EMMI on  [place] and Flu what to ask your Doctor RN Health Coach Sent EMMI on  Common  Cold  Erica MaidensFrances Fahad Stevens BSN RN [facility] [phone]

## 2015-07-23 NOTE — Patient Outreach (Signed)
[facility](THN) Care Management  07/23/2015  Erica LaundrySusan Gray Stevens [birth year] [id number]   Phone call to patient to provide community resources to meet patient's transportation needs.  Patient has [employer].  Transportation benefit through  [employer]  discussed phone number provided as well as for [place] [employer] [place] [employer].  Patient verbalized having no further social work needs. Case to be closed to social work at this time.  RN Health Coach informed.   Adriana ReamsChrystal Land, LCSW [facility] Care Management [phone]

## 2015-07-24 ENCOUNTER — Encounter: Payer: Self-pay | Admitting: *Deleted

## 2015-08-03 ENCOUNTER — Other Ambulatory Visit: Payer: Self-pay | Admitting: Pharmacist

## 2015-08-03 ENCOUNTER — Telehealth: Payer: Self-pay | Admitting: Family Medicine

## 2015-08-03 NOTE — Telephone Encounter (Signed)
Elizabeth [facility] clinical [facility] is requesting a return call. She is looking for the most recent lab work for [place] renal functions. The last ones she see is for 2 years ago.

## 2015-08-03 NOTE — Patient Outreach (Signed)
[facility]) Care Management  [place]   08/03/2015  Nizhoni Parlow Aug 23, 1951 [id number]  Subjective: Ursula Dermody is a 64 y.o. female referred to pharmacy for a medication review. Patient's allergies, medication list and problem list within EPIC reviewed.  Objective:   Current Medications: Current Outpatient Prescriptions  Medication Sig Dispense Refill  . aspirin 81 MG tablet Take 81 mg by mouth daily.    . enalapril (VASOTEC) 5 MG tablet Take 5 mg by mouth 2 (two) times daily.    . furosemide (LASIX) 40 MG tablet TAKE 1/2 TO 1 TABLET DAILY AS DIRECTED. 30 tablet 5  . isosorbide mononitrate (IMDUR) 30 MG 24 hr tablet TAKE ONE (1) TABLET EACH DAY 90 tablet 0  . metoprolol succinate (TOPROL-XL) 25 MG 24 hr tablet TAKE ONE (1) TABLET EACH DAY 30 tablet 2  . montelukast (SINGULAIR) 10 MG tablet TAKE ONE (1) TABLET EACH DAY **NEED FOLLOW UP 30 tablet 11  . nitroGLYCERIN (NITROLINGUAL) 0.4 MG/SPRAY spray AS DIRECTED 4.9 g 11  . omega-3 acid ethyl esters (LOVAZA) 1 G capsule TAKE TWO CAPSULES BY MOUTH TWICE DAILY. 120 capsule 11  . oxyCODONE (ROXICODONE) 5 MG immediate release tablet Take 1 tablet (5 mg total) by mouth every 4 (four) hours as needed for severe pain. Refill on 08/02/2015 30 tablet 0  . [START ON 09/01/2015] Oxycodone HCl 20 MG TABS Take 1 tablet (20 mg total) by mouth 4 (four) times daily. Refill on 09/01/2015 124 tablet 0  . potassium chloride SA (K-DUR,KLOR-CON) 20 MEQ tablet Take 20 mEq by mouth daily.    . pravastatin (PRAVACHOL) 40 MG tablet Take 40 mg by mouth 2 (two) times daily.    Marland Kitchen tiZANidine (ZANAFLEX) 4 MG tablet Take 4 mg by mouth 2 (two) times daily.    Marland Kitchen tolterodine (DETROL) 2 MG tablet Take 2 mg by mouth 2 (two) times daily.    Marland Kitchen venlafaxine (EFFEXOR) 75 MG tablet Takes 1 tablet am, 2 tablets in the evening daily.     No current facility-administered medications for this visit.   Assessment:   Drugs sorted by  system:  Neurologic/Psychologic: Effexor  Cardiovascular: aspirin, enalapril, furosemide, isosorbide mononitrate, metoprolol succinate, nitroglycerin spray, Lovaza, pravastatin  Pulmonary/Allergy: montelukast  Urinary: Detrol  Pain: oxycodone, tizanidine  Vitamins/Minerals: potassium   Duplications in therapy: none noted  Gaps in therapy: none noted  Medications to avoid in the elderly: tolterodine (anticholinergic effect)  Drug interactions: tolterodine + oral potassium: Solid oral dosage forms of potassium chloride are contraindicated in patients with impaired gastric emptying (e.g., due to the effects of drugs such as many anticholinergics). Patients on drugs with substantial anticholinergic effects, such as tolterodine, should avoid using any solid oral dosage form of potassium chloride. Liquid or effervescent potassium preparations are possible alternatives.  Other issues noted: . No lab work, such as basic metabolic panel and lipid panel, available in EPIC since 07/09/13. At that time, patient had decreased renal function, with a serum creatinine or 1.38 mg/dL and eGFR of 41 ml/min.   Plan:  1) Called patient's PCP's, Dr. Walker Kehr, office to request up to date lab results, including metabolic panel and lipid panel, for this patient. Left a message with Cassandra in the office. If have not heard back from Dr. Walker Kehr office by 08/05/15, will call again at that time. 2) Will also follow up with Dr. Rutherford Nail about the interaction between [facility] and the patient's oral potassium. Will ask that patient be assessed for  anticholinergic effects (e.g., cognitive impairment, dry mouth, constipation) and that patient's potassium dosage form be changed from a solid tablet to a liquid, sprinkle capsule or effervescent form.    Harlow Asa, PharmD Clinical Pharmacist [facility] [phone]

## 2015-08-04 NOTE — Telephone Encounter (Signed)
Left message for Erica Stevens. No labs on chart.

## 2015-08-05 ENCOUNTER — Telehealth: Payer: Self-pay | Admitting: Emergency Medicine

## 2015-08-05 ENCOUNTER — Other Ambulatory Visit: Payer: Self-pay | Admitting: Pharmacist

## 2015-08-05 DIAGNOSIS — E785 Hyperlipidemia, unspecified: Secondary | ICD-10-CM

## 2015-08-05 DIAGNOSIS — I10 Essential (primary) hypertension: Secondary | ICD-10-CM

## 2015-08-05 MED ORDER — POTASSIUM CHLORIDE CRYS ER 20 MEQ PO TBCR: 20.0000 meq | EXTENDED_RELEASE_TABLET | Freq: Every day | ORAL | Status: DC

## 2015-08-05 NOTE — Patient Outreach (Signed)
Received a voicemail from GordonHelen in Dr. Clance BollMorrisey's office stating that patient's Loney Laurencelabwork is over 65 years old. Called BethanyHelen back. Myriam JacobsonHelen states that the patient has been non-compliant about completing the labwork that her PCP has ordered. However, that she will continue to follow up with the patient to get this completed.  While on the phone with the office speak with Dr. Thana AtesMorrisey about the interaction between [facility] and the patient's oral potassium and ask that patient's potassium dosage form be changed from a solid tablet to a liquid, sprinkle capsule or effervescent form. Dr. Thana AtesMorrisey states that this change will be fine and that his office will call in one of this alternative dosage forms for the patient.  Duanne MoronElisabeth Linette Gunderson, PharmD Clinical Pharmacist [facility] Care Management [phone]

## 2015-08-05 NOTE — Telephone Encounter (Signed)
[facility] Erica Stevens called and stated patient has not prior labs on chart since 2014. Need Renal panel checked. PAtient also need potassium pills changed to capsule. Script sent to pharmacy. Patient was called and informed she need to pick up slip to do labs.

## 2015-08-10 ENCOUNTER — Other Ambulatory Visit: Payer: Self-pay | Admitting: Family Medicine

## 2015-08-14 ENCOUNTER — Telehealth: Payer: Self-pay | Admitting: Family Medicine

## 2015-08-14 NOTE — Telephone Encounter (Signed)
Pt would like to know if something can be called in for her for nausea and vomiting to [facility].

## 2015-08-19 ENCOUNTER — Other Ambulatory Visit: Payer: Self-pay | Admitting: *Deleted

## 2015-08-19 ENCOUNTER — Ambulatory Visit: Payer: Self-pay | Admitting: *Deleted

## 2015-08-19 NOTE — Patient Outreach (Signed)
[facility](THN) Care Management  08/19/2015  Beather [facility]bourSusan [facility] [birth year] [id number]   RN CM attempted  Follow up outreach call to patient.  Patient was unavailable. HIPPA compliance voicemail message was left with return callback number.    Gean MaidensFrances Linley Moskal BSN RN [facility] Care Management [phone]

## 2015-08-20 ENCOUNTER — Encounter: Payer: Self-pay | Admitting: *Deleted

## 2015-08-20 ENCOUNTER — Other Ambulatory Visit: Payer: Self-pay | Admitting: *Deleted

## 2015-08-20 NOTE — Patient Outreach (Signed)
[facility]N) Care Management  08/20/2015  Erica ArbourSusan Stevens Erica Stevens [birth year] [id number]   RN Health Coach telephone call to patient.  Hipaa compliance verified. Per patient she is not checking and recording her weights daily.  Per patient she has not gotten a scale. Patient has read the information that was sent by [facility]. Patient understands the signs and symptoms of Congestive heart failure. Patient is not having any symptoms of swelling in her lower extremities. Patient reports no shortness of breath. Patient has agreed to follow up out reach call.  Assessment Patient has no scale and is unable to follow up with daily weights and recording Patient would continue to benefit from outreach  follow up call from the RN Health Coach for support and education on Congestive Heart Failure.  Plan: RN Health Coach will send patient a scale RN Health Coach will send patient a 2017 calendar book RN Health Coach will send EMMI on Working with your Doctor RN Health Coach will send EMMI on  What is Heart Failure RN Health Coach will send EMMI on  Signs of Stroke RN Health Coach will send EMMI on Preventing Stroke and TIA  RN Health Coach will send EMMI on Cholesterol and lifestyle RN Health Coach will send EMMI on Cholesterol testing and results  RN Sent patient a 2017 Calendar book RN sent patient a chart on high and low salt foods  Erica Stevens BSN RN [facility] Management [phone]

## 2015-09-18 ENCOUNTER — Other Ambulatory Visit: Payer: Self-pay | Admitting: *Deleted

## 2015-09-18 ENCOUNTER — Ambulatory Visit: Payer: Self-pay | Admitting: *Deleted

## 2015-09-18 NOTE — Patient Outreach (Signed)
[facility]N) Care Management  09/18/2015  Erica ArbourSusan Stevens [facility] [birth year] [id number]   RN Health Coach telephone call to patient.  Line busy. Unable to leave vm. Gean MaidensFrances Loden Laurent BSN RN [facility] Care Management [phone]

## 2015-09-21 ENCOUNTER — Other Ambulatory Visit: Payer: Self-pay | Admitting: *Deleted

## 2015-09-21 NOTE — Patient Outreach (Signed)
[facility]N) Care Management  09/21/2015  [facility]Susan [facility] [birth year] [id number]   RN Health Coach telephone call to patient.  Hipaa compliance verified.Patient did receive the scale and weight today was 136 pounds. Per patient she has not read the material. RN Health Coach set a schedule of calling her back in two weeks to go over the material. Per patient she will weigh and document her recordings. Patient will read information and be ready for RN Health Coach call.  Assessment Patient has not read material Plan  RN Health Coach will call patient within 2 weeks for follow up Gean MaidensFrances Myriah Boggus BSN RN [facility] Management [phone]

## 2015-09-23 ENCOUNTER — Encounter: Payer: Commercial Managed Care - HMO | Admitting: Family Medicine

## 2015-10-01 ENCOUNTER — Encounter: Payer: Self-pay | Admitting: Family Medicine

## 2015-10-01 ENCOUNTER — Ambulatory Visit (INDEPENDENT_AMBULATORY_CARE_PROVIDER_SITE_OTHER): Payer: Commercial Managed Care - HMO | Admitting: Family Medicine

## 2015-10-01 VITALS — BP 112/68 | HR 114 | Temp 98.6°F | Resp 16 | Ht 60.0 in | Wt 130.3 lb

## 2015-10-01 DIAGNOSIS — J0100 Acute maxillary sinusitis, unspecified: Secondary | ICD-10-CM | POA: Diagnosis not present

## 2015-10-01 DIAGNOSIS — F3341 Major depressive disorder, recurrent, in partial remission: Secondary | ICD-10-CM | POA: Diagnosis not present

## 2015-10-01 DIAGNOSIS — I25700 Atherosclerosis of coronary artery bypass graft(s), unspecified, with unstable angina pectoris: Secondary | ICD-10-CM

## 2015-10-01 DIAGNOSIS — M5090 Cervical disc disorder, unspecified, unspecified cervical region: Secondary | ICD-10-CM | POA: Diagnosis not present

## 2015-10-01 DIAGNOSIS — I119 Hypertensive heart disease without heart failure: Secondary | ICD-10-CM

## 2015-10-01 DIAGNOSIS — M5417 Radiculopathy, lumbosacral region: Secondary | ICD-10-CM | POA: Diagnosis not present

## 2015-10-01 DIAGNOSIS — R0602 Shortness of breath: Secondary | ICD-10-CM | POA: Diagnosis not present

## 2015-10-01 DIAGNOSIS — M5412 Radiculopathy, cervical region: Secondary | ICD-10-CM

## 2015-10-01 DIAGNOSIS — G8929 Other chronic pain: Secondary | ICD-10-CM | POA: Diagnosis not present

## 2015-10-01 DIAGNOSIS — E7800 Pure hypercholesterolemia, unspecified: Secondary | ICD-10-CM | POA: Diagnosis not present

## 2015-10-01 MED ORDER — OXYCODONE HCL 20 MG PO TABS: 20.0000 mg | ORAL_TABLET | Freq: Four times a day (QID) | ORAL | Status: DC

## 2015-10-01 MED ORDER — OXYCODONE HCL 5 MG PO TABS: 5.0000 mg | ORAL_TABLET | ORAL | Status: DC | PRN

## 2015-10-01 MED ORDER — VENLAFAXINE HCL 75 MG PO TABS: 75.0000 mg | ORAL_TABLET | Freq: Three times a day (TID) | ORAL | Status: DC

## 2015-10-01 MED ORDER — AMOXICILLIN-POT CLAVULANATE 875-125 MG PO TABS: 1.0000 | ORAL_TABLET | Freq: Two times a day (BID) | ORAL | Status: DC

## 2015-10-01 MED ORDER — PREDNISONE 20 MG PO TABS: 20.0000 mg | ORAL_TABLET | Freq: Two times a day (BID) | ORAL | Status: DC

## 2015-10-01 NOTE — Progress Notes (Signed)
Name: Erica Stevens   MRN: [id number]    DOB: [birth year]   Date:10/01/2015       Progress Note  Subjective  Chief Complaint  Chief Complaint  Patient presents with  . Back Pain    3 month refill chronic pain meds  . Sinusitis    for 1 week    Back Pain Pertinent negatives include no chest pain, dysuria, fever, headaches, tingling, weakness or weight loss.  Sinusitis Associated symptoms include neck pain. Pertinent negatives include no chills, congestion, coughing, headaches, shortness of breath or sore throat.    Chronic pain   Patient presents for follow-up of chronic pain syndrome. The pain score at worse is 10 /10  and at best is 7/10 with medication rest and home remedies.  There is no evidence of any extra dosage or issues of usage outside of the prescribed regimen.  Pain is exacerbated by changes in weather and by strenuous activities. There have been no recent activities to exacerbate the chronic pain. Concomitant medications include oxycodone 20 mg every 12 hours and OxyIR 5 mg every 8 hours when necessary breakthrough pain .  Drug screen and medication contract have been renewed within the last 1 year.    Sinusitis  Patient presents with greater than 7 day history of nasal congestion and drainage which is purulent in color. There is tenderness over the sinuses. There has been fever to - along with some associated chills on occasion. Usage of over-the-counter medications is not been affected. There is also accompanying cough productive of purulent sputum.  Bronchitis  Patient presents with a greater than 1 week history of cough productive of purulent sputum. The cough is irritating and keep the patient awake at night. There has no fever or chills.  Over-the-counter meds And completely effective.  Hyperlipidemia  Patient has a history of hyperlipidemia for over 5 years.  Current medical regimen consist of pravastatin 40 mg daily at bedtime .  Compliance is good .  Diet  and exercise are currently followed some limitation of .  Risk factors for cardiovascular disease include hyperlipidemia hypertension and age .   There have been no side effects from the medication.    Coronary artery disease  Patient is not have any current chest pain palpitations orthopnea PND or extremity swelling. She is status post coronary artery bypass grafting several years ago.  Past Medical History  Diagnosis Date  . Degeneration of cervical intervertebral disc   . Mitral valve disorders   . Migraine, unspecified, without mention of intractable migraine without mention of status migrainosus   . Congestive heart failure, unspecified   . Thoracic or lumbosacral neuritis or radiculitis, unspecified   . Brachial neuritis or radiculitis NOS   . Hypertension   . Pure hypercholesterolemia   . Coronary atherosclerosis of unspecified type of vessel, native or graft   . Unspecified urinary incontinence   . Allergic rhinitis due to pollen   . Osteoporosis, unspecified   . Reflux esophagitis   . Chronic kidney disease, stage I   . Chronic pain syndrome   . Cellulitis and abscess of unspecified site     Social History  Substance Use Topics  . Smoking status: Former Smoker -- 1.00 packs/day for 20 years    Types: Cigarettes  . Smokeless tobacco: Not on file  . Alcohol Use: No      Allergies  Allergen Reactions  . Erythromycin   . Ivp Dye [Iodinated Diagnostic Agents]   . Sulfur  Review of Systems  Constitutional: Negative for fever, chills and weight loss.  HENT: Negative for congestion, hearing loss, sore throat and tinnitus.   Eyes: Negative for blurred vision, double vision and redness.  Respiratory: Negative for cough, hemoptysis and shortness of breath.   Cardiovascular: Negative for chest pain, palpitations, orthopnea, claudication and leg swelling.  Gastrointestinal: Negative for heartburn, nausea, vomiting, diarrhea, constipation and blood in stool.   Genitourinary: Negative for dysuria, urgency, frequency and hematuria.  Musculoskeletal: Positive for myalgias, back pain, joint pain and neck pain. Negative for falls.  Skin: Negative for itching.  Neurological: Negative for dizziness, tingling, tremors, focal weakness, seizures, loss of consciousness, weakness and headaches.  Endo/Heme/Allergies: Does not bruise/bleed easily.  Psychiatric/Behavioral: Negative for depression and substance abuse. The patient is not nervous/anxious and does not have insomnia.      Objective  Filed Vitals:   10/01/15 1033  BP: 112/68  Pulse: 114  Temp: 98.6 F (37 C)  TempSrc: Oral  Resp: 16  Height: 5' (1.524 m)  Weight: 130 lb 4.8 oz (59.104 kg)  SpO2: 94%     Physical Exam  Constitutional: She is oriented to person, place, and time and well-developed, well-nourished, and in no distress.  HENT:  Bilateral nasal turbinate swelling with purulent mucopurulent discharge pharynx minimally injected  Eyes: EOM are normal. Pupils are equal, round, and reactive to light.  Neck: Normal range of motion. No thyromegaly present.  Cardiovascular: Normal rate, regular rhythm and normal heart sounds.   No murmur heard. Pulmonary/Chest: Effort normal.  Slightly diminished breath sounds   without rhonchi or wheezes   Abdominal: Soft. Bowel sounds are normal.  Musculoskeletal: She exhibits tenderness. She exhibits no edema.  Neurological: She is alert and oriented to person, place, and time. No cranial nerve deficit. Gait normal.  Skin: Skin is warm and dry. No rash noted.  Psychiatric: Memory and affect normal.      Assessment & Plan  1. Chronic pain Stable - Drug Screen (9) + Creatinine, Ur - oxyCODONE (ROXICODONE) 5 MG immediate release tablet; Take 1 tablet (5 mg total) by mouth every 4 (four) hours as needed for severe pain. Refill on 08/02/2015  Dispense: 30 tablet; Refill: 0 - oxyCODONE (ROXICODONE) 5 MG immediate release tablet; Take 1 tablet  (5 mg total) by mouth every 4 (four) hours as needed for severe pain. Refill on 08/02/2015  Dispense: 30 tablet; Refill: 0  2. Hypercholesterolemia without hypertriglyceridemia Emphatically informed patient she was given a lab work done as it has been more ordered several times without her reporting to get it done - Comprehensive Metabolic Panel (CMET) - Lipid panel  3. Shortness of breath  Secondary to URI probably  4. Coronary artery disease involving coronary bypass graft of native heart with unstable angina pectoris (HCC) Stable - Comprehensive Metabolic Panel (CMET) - TSH  5. Benign hypertensive heart disease, without heart failure Stable - Comprehensive Metabolic Panel (CMET)  6. Lumbosacral neuritis Stable  7. Brachial neuritis Stable - TSH  8. Recurrent major depression in partial remission (HCC) Stable on current meds  9. Cervical disc disease Stable - Oxycodone HCl 20 MG TABS; Take 1 tablet (20 mg total) by mouth 4 (four) times daily. Refill on 09/01/2015  Dispense: 124 tablet; Refill: 0 - Oxycodone HCl 20 MG TABS; Take 1 tablet (20 mg total) by mouth 4 (four) times daily. Refill on 09/01/2015  Dispense: 124 tablet; Refill: 0 - Oxycodone HCl 20 MG TABS; Take 1 tablet (20 mg total)  by mouth 4 (four) times daily. Refill on 09/01/2015  Dispense: 124 tablet; Refill: 0 - oxyCODONE (ROXICODONE) 5 MG immediate release tablet; Take 1 tablet (5 mg total) by mouth every 4 (four) hours as needed for severe pain. Refill on 08/02/2015  Dispense: 30 tablet; Refill: 0 - oxyCODONE (ROXICODONE) 5 MG immediate release tablet; Take 1 tablet (5 mg total) by mouth every 4 (four) hours as needed for severe pain. Refill on 08/02/2015  Dispense: 30 tablet; Refill: 0 - Oxycodone HCl 20 MG TABS; Take 1 tablet (20 mg total) by mouth 4 (four) times daily. Refill on 09/01/2015  Dispense: 124 tablet; Refill: 0  10. Acute maxillary sinusitis, recurrence not specified Augmentin and prednisone -  amoxicillin-clavulanate (AUGMENTIN) 875-125 MG tablet; Take 1 tablet by mouth 2 (two) times daily.  Dispense: 20 tablet; Refill: 0 - predniSONE (DELTASONE) 20 MG tablet; Take 1 tablet (20 mg total) by mouth 2 (two) times daily with a meal.  Dispense: 10 tablet; Refill: 0

## 2015-10-02 ENCOUNTER — Encounter: Payer: Self-pay | Admitting: Family Medicine

## 2015-10-02 DIAGNOSIS — M5090 Cervical disc disorder, unspecified, unspecified cervical region: Secondary | ICD-10-CM | POA: Insufficient documentation

## 2015-10-02 LAB — COMPREHENSIVE METABOLIC PANEL
A/G RATIO: 1.2 (ref 1.1–2.5)
ALBUMIN: 4.1 g/dL (ref 3.6–4.8)
ALT: 8 IU/L (ref 0–32)
AST: 19 IU/L (ref 0–40)
Alkaline Phosphatase: 105 IU/L (ref 39–117)
BUN / CREAT RATIO: 7 — AB (ref 11–26)
BUN: 9 mg/dL (ref 8–27)
Bilirubin Total: 0.6 mg/dL (ref 0.0–1.2)
CALCIUM: 9.2 mg/dL (ref 8.7–10.3)
CO2: 23 mmol/L (ref 18–29)
CREATININE: 1.27 mg/dL — AB (ref 0.57–1.00)
Chloride: 95 mmol/L — ABNORMAL LOW (ref 96–106)
GFR, EST AFRICAN AMERICAN: 51 mL/min/{1.73_m2} — AB (ref >59)
GFR, EST NON AFRICAN AMERICAN: 44 mL/min/{1.73_m2} — AB (ref >59)
GLOBULIN, TOTAL: 3.4 g/dL (ref 1.5–4.5)
Glucose: 103 mg/dL — ABNORMAL HIGH (ref 65–99)
POTASSIUM: 4.3 mmol/L (ref 3.5–5.2)
SODIUM: 140 mmol/L (ref 134–144)
Total Protein: 7.5 g/dL (ref 6.0–8.5)

## 2015-10-02 LAB — LIPID PANEL
CHOL/HDL RATIO: 3.2 ratio (ref 0.0–4.4)
Cholesterol, Total: 200 mg/dL — ABNORMAL HIGH (ref 100–199)
HDL: 62 mg/dL (ref >39)
LDL CALC: 119 mg/dL — AB (ref 0–99)
TRIGLYCERIDES: 93 mg/dL (ref 0–149)
VLDL Cholesterol Cal: 19 mg/dL (ref 5–40)

## 2015-10-02 LAB — TSH: TSH: 2.11 u[IU]/mL (ref 0.450–4.500)

## 2015-10-05 ENCOUNTER — Ambulatory Visit: Payer: Self-pay | Admitting: *Deleted

## 2015-10-05 ENCOUNTER — Other Ambulatory Visit: Payer: Self-pay | Admitting: *Deleted

## 2015-10-05 NOTE — Patient Outreach (Addendum)
[facility]) Care Management  10/05/2015  Selma Mink 1950-12-03 [id number]   RN Health Coach  attempted  Follow up outreach call to patient.  Patient was unavailable. Telephone rang once and then went into busy mode. RN Health Coach retried and same thing happened again.  [name] BSN RN [facility] Care Management [phone]

## 2015-10-12 ENCOUNTER — Encounter: Payer: Commercial Managed Care - HMO | Admitting: Family Medicine

## 2015-10-12 ENCOUNTER — Telehealth: Payer: Self-pay | Admitting: Emergency Medicine

## 2015-10-12 MED ORDER — ATORVASTATIN CALCIUM 40 MG PO TABS: 40.0000 mg | ORAL_TABLET | Freq: Every day | ORAL | Status: DC

## 2015-10-12 NOTE — Telephone Encounter (Signed)
Sent letter to patient regarding lab results and to reschedule her appointment. Script sen to pharmacy and Pravastatin was d/c.  Could no reach patient by phone invalid number.

## 2015-10-22 ENCOUNTER — Other Ambulatory Visit: Payer: Self-pay | Admitting: *Deleted

## 2015-10-22 NOTE — Patient Outreach (Addendum)
.  [facility] Care [facility]  10/22/2015  Erica Stevens [facility] 02-Feb-1951 [id number]   RN Health Coach  attempted #2 Follow up outreach call to patient.  Patient was unavailable. HIPPA compliance voicemail message was left with return callback number.    [name], BSN, RN [facility] Care Management RN Health Coach Phone: [phone] [facility] complies with applicable Federal civil rights laws and does not discriminate on the basis of race, color, national origin, age, disability, or sex. Espaol (Spanish)  [facility] Customer service manager cumple con las leyes federales de derechos civiles aplicables y no discrimina por motivos de raza, color, nacionalidad, edad, discapacidad o sexo.    [place]  [facility] Art gallery manager th? lu?t dn quy?n hi?n hnh c?a Lin bang v khng phn bi?t ?i x? d?a trn ch?ng t?c, mu da, ngu?n g?c qu?c gia, ? tu?i, khuy?t t?t, ho?c gi?i tnh.    (Arabic)    [facility] Customer service manager                      .

## 2015-10-26 ENCOUNTER — Other Ambulatory Visit: Payer: Self-pay | Admitting: *Deleted

## 2015-10-26 NOTE — Patient Outreach (Signed)
[facility]) Care Management  10/26/2015  Krisha Beegle [facility] Jun 18, 1951 [id number]  RN Health coach 3rd  attempted  Follow up outreach call to patient.  Patient was unavailable. Person answering phone stated she was not there and could she take a message. HIPPA compliance message was left with return callback number.    [name] BSN RN [facility] Care Management [phone]

## 2015-11-13 ENCOUNTER — Encounter: Payer: Self-pay | Admitting: *Deleted

## 2015-11-20 ENCOUNTER — Telehealth: Payer: Self-pay | Admitting: Family Medicine

## 2015-11-20 DIAGNOSIS — M5090 Cervical disc disorder, unspecified, unspecified cervical region: Secondary | ICD-10-CM

## 2015-11-20 MED ORDER — OXYCODONE HCL 20 MG PO TABS: 20.0000 mg | ORAL_TABLET | Freq: Four times a day (QID) | ORAL | Status: DC

## 2015-11-20 NOTE — Telephone Encounter (Signed)
Reviewed dispense history and NCCSRS with no irregularities found. Will print out new RX as PCP is out of the office for extended duration.

## 2015-11-20 NOTE — Addendum Note (Signed)
Addended by: Edwena FeltySUNDARAM, Brain Honeycutt on: 11/20/2015 03:08 PM   Modules accepted: Orders, Medications

## 2015-11-20 NOTE — Telephone Encounter (Signed)
Was given a script for oxycodone for 3/19 to have refilled. Has looked everywhere and cannot find the script. Can this be written over for pick up next week

## 2015-11-23 ENCOUNTER — Encounter: Payer: Commercial Managed Care - HMO | Admitting: Family Medicine

## 2015-11-30 ENCOUNTER — Other Ambulatory Visit: Payer: Self-pay

## 2015-11-30 DIAGNOSIS — M5090 Cervical disc disorder, unspecified, unspecified cervical region: Secondary | ICD-10-CM

## 2015-11-30 MED ORDER — OXYCODONE HCL 20 MG PO TABS: 20.0000 mg | ORAL_TABLET | Freq: Four times a day (QID) | ORAL | Status: DC

## 2015-12-30 ENCOUNTER — Other Ambulatory Visit: Payer: Self-pay

## 2015-12-30 ENCOUNTER — Ambulatory Visit: Payer: Commercial Managed Care - HMO | Admitting: Family Medicine

## 2015-12-30 DIAGNOSIS — F1110 Opioid abuse, uncomplicated: Secondary | ICD-10-CM

## 2015-12-30 DIAGNOSIS — Z0283 Encounter for blood-alcohol and blood-drug test: Secondary | ICD-10-CM

## 2016-02-09 ENCOUNTER — Ambulatory Visit: Payer: Commercial Managed Care - HMO | Admitting: Family Medicine

## 2016-02-24 ENCOUNTER — Encounter: Payer: Self-pay | Admitting: Family Medicine

## 2016-02-24 ENCOUNTER — Ambulatory Visit (INDEPENDENT_AMBULATORY_CARE_PROVIDER_SITE_OTHER): Payer: Commercial Managed Care - HMO | Admitting: Family Medicine

## 2016-02-24 VITALS — BP 124/86 | HR 81 | Temp 98.1°F | Resp 16 | Ht 60.0 in | Wt 132.9 lb

## 2016-02-24 DIAGNOSIS — E785 Hyperlipidemia, unspecified: Secondary | ICD-10-CM | POA: Diagnosis not present

## 2016-02-24 DIAGNOSIS — E7800 Pure hypercholesterolemia, unspecified: Secondary | ICD-10-CM | POA: Diagnosis not present

## 2016-02-24 DIAGNOSIS — F3341 Major depressive disorder, recurrent, in partial remission: Secondary | ICD-10-CM | POA: Diagnosis not present

## 2016-02-24 DIAGNOSIS — I5020 Unspecified systolic (congestive) heart failure: Secondary | ICD-10-CM

## 2016-02-24 DIAGNOSIS — N3941 Urge incontinence: Secondary | ICD-10-CM | POA: Diagnosis not present

## 2016-02-24 DIAGNOSIS — R252 Cramp and spasm: Secondary | ICD-10-CM

## 2016-02-24 DIAGNOSIS — M5090 Cervical disc disorder, unspecified, unspecified cervical region: Secondary | ICD-10-CM | POA: Diagnosis not present

## 2016-02-24 DIAGNOSIS — Z23 Encounter for immunization: Secondary | ICD-10-CM | POA: Diagnosis not present

## 2016-02-24 DIAGNOSIS — I119 Hypertensive heart disease without heart failure: Secondary | ICD-10-CM | POA: Diagnosis not present

## 2016-02-24 DIAGNOSIS — G8929 Other chronic pain: Secondary | ICD-10-CM

## 2016-02-24 DIAGNOSIS — I209 Angina pectoris, unspecified: Secondary | ICD-10-CM | POA: Diagnosis not present

## 2016-02-24 MED ORDER — OXYCODONE HCL 20 MG PO TABS: 20.0000 mg | ORAL_TABLET | Freq: Four times a day (QID) | ORAL | Status: DC

## 2016-02-24 MED ORDER — ENALAPRIL MALEATE 5 MG PO TABS
5.0000 mg | ORAL_TABLET | Freq: Two times a day (BID) | ORAL | Status: DC
Start: 2016-02-24 — End: 2017-01-14

## 2016-02-24 MED ORDER — FUROSEMIDE 40 MG PO TABS: 20.0000 mg | ORAL_TABLET | Freq: Every day | ORAL | Status: DC

## 2016-02-24 MED ORDER — OXYCODONE HCL 20 MG PO TABS: 20.0000 mg | ORAL_TABLET | Freq: Two times a day (BID) | ORAL | Status: DC

## 2016-02-24 MED ORDER — METOPROLOL SUCCINATE ER 25 MG PO TB24: ORAL_TABLET | ORAL | Status: DC

## 2016-02-24 MED ORDER — VENLAFAXINE HCL ER 150 MG PO CP24: 150.0000 mg | ORAL_CAPSULE | Freq: Every day | ORAL | Status: DC

## 2016-02-24 MED ORDER — ATORVASTATIN CALCIUM 40 MG PO TABS: 40.0000 mg | ORAL_TABLET | Freq: Every day | ORAL | Status: DC

## 2016-02-24 MED ORDER — TIZANIDINE HCL 4 MG PO TABS: 4.0000 mg | ORAL_TABLET | Freq: Two times a day (BID) | ORAL | Status: DC

## 2016-02-24 MED ORDER — POTASSIUM CHLORIDE CRYS ER 20 MEQ PO TBCR: 20.0000 meq | EXTENDED_RELEASE_TABLET | Freq: Every day | ORAL | Status: DC

## 2016-02-24 MED ORDER — TOLTERODINE TARTRATE 2 MG PO TABS: 2.0000 mg | ORAL_TABLET | Freq: Two times a day (BID) | ORAL | Status: DC

## 2016-02-24 MED ORDER — ISOSORBIDE MONONITRATE ER 30 MG PO TB24: ORAL_TABLET | ORAL | Status: DC

## 2016-02-24 NOTE — Progress Notes (Signed)
Name: Erica Stevens   MRN: [id number]    DOB: 1950-10-10   Date:02/25/2016       Progress Note  Subjective  Chief Complaint  Chief Complaint  Patient presents with  . Pain    med refill    HPI  Cervical radiculitis: patient of Dr. Thana AtesMorrisey, she states she has gone to pain clinic, has tried PT, injections, steroid tapers, but is not candidate for surgery. She has been given oxycodone 20 mg four times daily and oxycodone 5 mg prn, but currently takes about 2 pills daily, she states pain is constant, described as aching and gets worse as the day progresses and has spasms at times, radiates down to right shoulder, she has some weakness on right hand.   CHF: she is s/p CABG, still takes Imdur for angina, that occurs with moderate activity, she is complaint with her medications. She is able to go up and down a few steps without of SOB. She denies leg swellings, no orthopnea.  HTN: well controlled, denies side effects of medication  Hyperlipidemia: last labs showed LDL above goal, she states she has been taking Lipitor, we may need to switch to Crestor.   Major Depression: she has a long history of depression, she would prefer taking medication once daily. Situations now, son lives with her and also his girlfriend and they don't get along. She denies crying spells, she states she feels well most of the time.  Patient Active Problem List   Diagnosis Date Noted  . Angina pectoris (HCC) 02/24/2016  . Cervical disc disease 10/02/2015  . Affective disorder, major (HCC) 07/02/2015  . Benign hypertensive heart disease with CHF (congestive heart failure) (HCC) 07/02/2015  . Benign hypertensive heart disease 03/09/2015  . Migraine 03/09/2015  . Billowing mitral valve 03/09/2015  . Osteopetrosis 03/09/2015  . Urge incontinence 03/09/2015  . Chest pressure 06/21/2013  . Leg edema 06/21/2013  . Shortness of breath 06/21/2013  . CAD (coronary artery disease) of artery bypass graft 06/21/2013   . Hyperlipidemia 06/21/2013  . Chronic pain 03/25/2009  . Hypercholesterolemia without hypertriglyceridemia 05/22/2007  . Coronary atherosclerosis 05/22/2007  . 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 Fasciotomy    . Bladder surgery    . Cholecystectomy    . Abdominal hysterectomy    . Colonoscopy    . Coronary artery bypass graft      x3 grafts;[facility]  . Cardiac catheterization  2014    [facility]    Family History  Problem Relation Age of Onset  . Hypertension Mother   . Hyperlipidemia Mother   . Heart disease Mother     Social History   Social History  . Marital Status: Single    Spouse Name: N/A  . Number of Children: N/A  . Years of Education: N/A   Occupational History  . Not on file.   Social History Main Topics  . Smoking status: Former Smoker -- 1.00 packs/day for 20 years    Types: Cigarettes  . Smokeless tobacco: Not on file  . Alcohol Use: No  . Drug Use: No  . Sexual Activity: Not Currently   Other Topics Concern  . Not on file   Social History Narrative     Current outpatient prescriptions:  .  aspirin 81 MG tablet, Take 81 mg by mouth daily., Disp: , Rfl:  .  atorvastatin (LIPITOR) 40 MG tablet, Take 1 tablet (40 mg  total) by mouth daily., Disp: 30 tablet, Rfl: 5 .  enalapril (VASOTEC) 5 MG tablet, Take 1 tablet (5 mg total) by mouth 2 (two) times daily., Disp: 30 tablet, Rfl: 5 .  furosemide (LASIX) 40 MG tablet, Take 0.5-1 tablets (20-40 mg total) by mouth daily., Disp: 30 tablet, Rfl: 5 .  isosorbide mononitrate (IMDUR) 30 MG 24 hr tablet, TAKE ONE (1) TABLET EACH DAY, Disp: 90 tablet, Rfl: 1 .  metoprolol succinate (TOPROL-XL) 25 MG 24 hr tablet, TAKE ONE (1) TABLET EACH DAY, Disp: 30 tablet, Rfl: 5 .  montelukast (SINGULAIR) 10 MG tablet, Take 1 tablet (10 mg total) by mouth at bedtime., Disp: 30 tablet, Rfl: 11 .  nitroGLYCERIN (NITROLINGUAL) 0.4 MG/SPRAY spray,  AS DIRECTED, Disp: 4.9 g, Rfl: 11 .  omega-3 acid ethyl esters (LOVAZA) 1 G capsule, TAKE TWO CAPSULES BY MOUTH TWICE DAILY., Disp: 120 capsule, Rfl: 11 .  Oxycodone HCl 20 MG TABS, Take 1 tablet (20 mg total) by mouth 2 (two) times daily., Disp: 60 tablet, Rfl: 0 .  Oxycodone HCl 20 MG TABS, Take 1 tablet (20 mg total) by mouth 2 (two) times daily., Disp: 60 tablet, Rfl: 0 .  potassium chloride SA (K-DUR,KLOR-CON) 20 MEQ tablet, Take 1 tablet (20 mEq total) by mouth daily., Disp: 30 tablet, Rfl: 5 .  tiZANidine (ZANAFLEX) 4 MG tablet, Take 1 tablet (4 mg total) by mouth 2 (two) times daily., Disp: 60 tablet, Rfl: 5 .  tolterodine (DETROL) 2 MG tablet, Take 1 tablet (2 mg total) by mouth 2 (two) times daily., Disp: 60 tablet, Rfl: 5 .  traZODone (DESYREL) 100 MG tablet, TAKE ONE TABLET AT BEDTIME, Disp: 30 tablet, Rfl: 5 .  venlafaxine XR (EFFEXOR XR) 150 MG 24 hr capsule, Take 1 capsule (150 mg total) by mouth daily with breakfast., Disp: 30 capsule, Rfl: 5  Allergies  Allergen Reactions  . Erythromycin   . Ivp Dye [Iodinated Diagnostic Agents]   . Sulfur      ROS  Constitutional: Negative for fever or weight change.  Respiratory: Negative for cough and shortness of breath.   Cardiovascular: Positive for chest pain ( seldom ) or palpitations.  Gastrointestinal: Negative for abdominal pain, no bowel changes.  Musculoskeletal: Negative for gait problem or joint swelling.  Skin: Negative for rash.  Neurological: Negative for dizziness or headache.  No other specific complaints in a complete review of systems (except as listed in HPI above).  Objective  Filed Vitals:   02/24/16 1452  BP: 124/86  Pulse: 81  Temp: 98.1 F (36.7 C)  TempSrc: Oral  Resp: 16  Height: 5' (1.524 m)  Weight: 132 lb 14.4 oz (60.283 kg)  SpO2: 97%    Body mass index is 25.96 kg/(m^2).  Physical Exam  Constitutional: Patient appears well-developed and well-nourished. ObeseNo distress.  HEENT: head  atraumatic, normocephalic, pupils equal and reactive to light,neck supple, throat within normal limits Cardiovascular: Normal rate, regular rhythm and normal heart sounds.  No murmur heard. No BLE edema. Pulmonary/Chest: Effort normal and breath sounds normal. No respiratory distress. Abdominal: Soft.  There is no tenderness. Psychiatric: Patient has a normal mood and affect. behavior is normal. Judgment and thought content normal. Muscular Skeletal: decrease rom of left hip, also pain and decrease rom of neck  PHQ2/9: Depression screen [facility] 2/9 02/24/2016 10/01/2015 08/20/2015 07/10/2015 07/02/2015  Decreased Interest 0 0 1 1 0  Down, Depressed, Hopeless 0 0 0 0 0  PHQ - 2 Score 0 0 1  1 0     Fall Risk: Fall Risk  02/24/2016 10/01/2015 08/20/2015 07/23/2015 07/02/2015  Falls in the past year? No No - No No  Risk for fall due to : - - Impaired balance/gait;Impaired mobility - -      Functional Status Survey: Is the patient deaf or have difficulty hearing?: No Does the patient have difficulty seeing, even when wearing glasses/contacts?: No Does the patient have difficulty concentrating, remembering, or making decisions?: No Does the patient have difficulty walking or climbing stairs?: No Does the patient have difficulty dressing or bathing?: No Does the patient have difficulty doing errands alone such as visiting a doctor's office or shopping?: No    Assessment & Plan  1. Cervical disc disease  We will decrease from 4 pills daily to two daily since that is what she usually takes and refer to pain clinic - Oxycodone HCl 20 MG TABS; Take 1 tablet (20 mg total) by mouth 2 (two) times daily.  Dispense: 60 tablet; Refill: 0 - Oxycodone HCl 20 MG TABS; Take 1 tablet (20 mg total) by mouth 2 (two) times daily. Fill July 13th, 2017  Dispense: 60 tablet; Refill: 0 - Ambulatory referral to Pain Clinic  2. Chronic pain  - tiZANidine (ZANAFLEX) 4 MG tablet; Take 1 tablet (4 mg total) by mouth  2 (two) times daily.  Dispense: 60 tablet; Refill: 5 - Ambulatory referral to Pain Clinic  3. Hypercholesterolemia without hypertriglyceridemia  Continue Atorvastatin   4. Benign hypertensive heart disease, without heart failure  - metoprolol succinate (TOPROL-XL) 25 MG 24 hr tablet; TAKE ONE (1) TABLET EACH DAY  Dispense: 30 tablet; Refill: 5 - enalapril (VASOTEC) 5 MG tablet; Take 1 tablet (5 mg total) by mouth 2 (two) times daily.  Dispense: 30 tablet; Refill: 5  5. Recurrent major depression in partial remission (HCC)  - venlafaxine XR (EFFEXOR XR) 150 MG 24 hr capsule; Take 1 capsule (150 mg total) by mouth daily with breakfast.  Dispense: 30 capsule; Refill: 5  6. Hyperlipemia  - atorvastatin (LIPITOR) 40 MG tablet; Take 1 tablet (40 mg total) by mouth daily.  Dispense: 30 tablet; Refill: 5  7. Systolic congestive heart failure, unspecified congestive heart failure chronicity (HCC)  - metoprolol succinate (TOPROL-XL) 25 MG 24 hr tablet; TAKE ONE (1) TABLET EACH DAY  Dispense: 30 tablet; Refill: 5 - furosemide (LASIX) 40 MG tablet; Take 0.5-1 tablets (20-40 mg total) by mouth daily.  Dispense: 30 tablet; Refill: 5 - potassium chloride SA (K-DUR,KLOR-CON) 20 MEQ tablet; Take 1 tablet (20 mEq total) by mouth daily.  Dispense: 30 tablet; Refill: 5 - enalapril (VASOTEC) 5 MG tablet; Take 1 tablet (5 mg total) by mouth 2 (two) times daily.  Dispense: 30 tablet; Refill: 5  8. Urge incontinence  - tolterodine (DETROL) 2 MG tablet; Take 1 tablet (2 mg total) by mouth 2 (two) times daily.  Dispense: 60 tablet; Refill: 5  9. Angina pectoris (HCC)  - isosorbide mononitrate (IMDUR) 30 MG 24 hr tablet; TAKE ONE (1) TABLET EACH DAY  Dispense: 90 tablet; Refill: 1  10. Need for pneumococcal vaccination  - Pneumococcal polysaccharide vaccine 23-valent greater than or equal to 2yo subcutaneous/IM  11. Cramp of both lower extremities  Advised labs, magnesium and potassium, but she wants  to hold off, she will try otc magnesium for now

## 2016-02-25 MED ORDER — OXYCODONE HCL 20 MG PO TABS: 20.0000 mg | ORAL_TABLET | Freq: Two times a day (BID) | ORAL | Status: DC

## 2016-02-25 MED ORDER — MONTELUKAST SODIUM 10 MG PO TABS: 10.0000 mg | ORAL_TABLET | Freq: Every day | ORAL | Status: DC

## 2016-04-20 ENCOUNTER — Ambulatory Visit (INDEPENDENT_AMBULATORY_CARE_PROVIDER_SITE_OTHER): Payer: Commercial Managed Care - HMO | Admitting: Family Medicine

## 2016-04-20 ENCOUNTER — Telehealth: Payer: Self-pay | Admitting: Pain Medicine

## 2016-04-20 ENCOUNTER — Encounter: Payer: Self-pay | Admitting: Family Medicine

## 2016-04-20 DIAGNOSIS — G8929 Other chronic pain: Secondary | ICD-10-CM

## 2016-04-20 DIAGNOSIS — M5090 Cervical disc disorder, unspecified, unspecified cervical region: Secondary | ICD-10-CM | POA: Diagnosis not present

## 2016-04-20 MED ORDER — OXYCODONE HCL 10 MG PO TABS: 10.0000 mg | ORAL_TABLET | Freq: Two times a day (BID) | ORAL | 0 refills | Status: DC

## 2016-04-20 MED ORDER — GABAPENTIN 100 MG PO CAPS: 100.0000 mg | ORAL_CAPSULE | Freq: Three times a day (TID) | ORAL | 0 refills | Status: DC

## 2016-04-20 NOTE — Progress Notes (Signed)
Name: Erica Stevens   MRN: [id number]    DOB: 1950-12-04   Date:04/20/2016       Progress Note  Subjective  Chief Complaint  Chief Complaint  Patient presents with  . Neck Pain    HPI  Cervical radiculitis: patient of Dr. Thana Ates, she states she has gone to pain clinic, has tried PT, injections, steroid tapers, but is not candidate for surgery. She has been given oxycodone 20 mg four times daily and oxycodone 5 mg prn, we decreased to Oxycodone 20 mg twice daily two months ago. Current pain is 8/10 on the posterior neck and radiates down to right shoulder,  she states pain is constant. Pain right pain is  described as a throbbing sensation. Pain gets worse as the day progresses and has spasms at times, she has some weakness on right hand. We made a referral to pain clinic but patient states she did not get a call back with appointment. We will contact Dr. Idalia Needle. We will decrease dose of Oxycodone to 10 mg twice daily. We will add Gabapentin 100 mg and titrate up to control radiculitis. Discussed possible side effects and the need to titrate up and down the medication  Patient Active Problem List   Diagnosis Date Noted  . Angina pectoris (HCC) 02/24/2016  . Cervical disc disease 10/02/2015  . Affective disorder, major (HCC) 07/02/2015  . 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 Benign hypertensive heart disease 03/09/2015  . Migraine 03/09/2015  . Billowing mitral valve 03/09/2015  . Osteopetrosis 03/09/2015  . Urge incontinence 03/09/2015  . Chest pressure 06/21/2013  . Leg edema 06/21/2013  . Shortness of breath 06/21/2013  . CAD (coronary artery disease) of artery bypass graft 06/21/2013  . Hyperlipidemia 06/21/2013  . Chronic pain 03/25/2009  . Hypercholesterolemia without hypertriglyceridemia 05/22/2007  . Coronary atherosclerosis 05/22/2007  . 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 ABDOMINAL HYSTERECTOMY    . BLADDER SURGERY    . CARDIAC CATHETERIZATION  2014   [facility]  . CHOLECYSTECTOMY    . COLONOSCOPY    . CORONARY ARTERY BYPASS GRAFT     x3 grafts;[facility]  . fasciotomy      Family History  Problem Relation Age of Onset  . Hypertension Mother   . Hyperlipidemia Mother   . Heart disease Mother     Social History   Social History  . Marital status: Single    Spouse name: N/A  . Number of children: N/A  . Years of education: N/A   Occupational History  . Not on file.   Social History Main Topics  . Smoking status: Former Smoker    Packs/day: 1.00    Years: 20.00    Types: Cigarettes  . Smokeless tobacco: Not on file  . Alcohol use No  . Drug use: No  . Sexual activity: Not Currently   Other Topics Concern  . Not on file   Social History Narrative  . No narrative on file     Current Outpatient Prescriptions:  .  aspirin 81 MG tablet, Take 81 mg by mouth daily., Disp: , Rfl:  .  atorvastatin (LIPITOR) 40 MG tablet, Take 1 tablet (40 mg total) by mouth daily., Disp: 30 tablet, Rfl: 5 .  enalapril (VASOTEC) 5 MG tablet, Take 1 tablet (5 mg total) by mouth 2 (two) times daily., Disp: 30 tablet, Rfl: 5 .  furosemide (LASIX) 40 MG tablet, Take 0.5-1 tablets (20-40 mg total) by mouth daily., Disp: 30 tablet, Rfl: 5 .  isosorbide mononitrate (IMDUR) 30 MG 24 hr tablet, TAKE ONE (1) TABLET EACH DAY, Disp: 90 tablet, Rfl: 1 .  metoprolol succinate (TOPROL-XL) 25 MG 24 hr tablet, TAKE ONE (1) TABLET EACH DAY, Disp: 30 tablet, Rfl: 5 .  montelukast (SINGULAIR) 10 MG tablet, Take 1 tablet (10 mg total) by mouth at bedtime., Disp: 30 tablet, Rfl: 11 .  nitroGLYCERIN (NITROLINGUAL) 0.4 MG/SPRAY spray, AS DIRECTED, Disp: 4.9 g, Rfl: 11 .  omega-3 acid ethyl esters (LOVAZA) 1 G capsule, TAKE TWO CAPSULES BY MOUTH TWICE DAILY., Disp: 120 capsule, Rfl: 11 .  potassium chloride SA (K-DUR,KLOR-CON) 20 MEQ tablet, Take 1  tablet (20 mEq total) by mouth daily., Disp: 30 tablet, Rfl: 5 .  tiZANidine (ZANAFLEX) 4 MG tablet, Take 1 tablet (4 mg total) by mouth 2 (two) times daily., Disp: 60 tablet, Rfl: 5 .  tolterodine (DETROL) 2 MG tablet, Take 1 tablet (2 mg total) by mouth 2 (two) times daily., Disp: 60 tablet, Rfl: 5 .  venlafaxine XR (EFFEXOR XR) 150 MG 24 hr capsule, Take 1 capsule (150 mg total) by mouth daily with breakfast., Disp: 30 capsule, Rfl: 5 .  gabapentin (NEURONTIN) 100 MG capsule, Take 1-3 capsules (100-300 mg total) by mouth 3 (three) times daily., Disp: 90 capsule, Rfl: 0 .  Oxycodone HCl 10 MG TABS, Take 1 tablet (10 mg total) by mouth 2 (two) times daily., Disp: 60 tablet, Rfl: 0  Allergies  Allergen Reactions  . Erythromycin   . Ivp Dye [Iodinated Diagnostic Agents]   . Sulfur      ROS  Constitutional: Negative for fever or weight change.  Respiratory: negative for cough or shortness of breath.   Cardiovascular: Negative for chest pain or palpitations.  Gastrointestinal: Negative for abdominal pain, no bowel changes.  Musculoskeletal: Negative for gait problem or joint swelling.  Skin: Negative for rash.  Neurological: Negative for dizziness or headache.  No other specific complaints in a complete review of systems (except as listed in HPI above).  Objective  Vitals:   04/20/16 1001  BP: 134/76  Pulse: 77  Resp: 14  Temp: 99.1 F (37.3 C)  TempSrc: Oral  SpO2: 98%  Weight: 131 lb 14.4 oz (59.8 kg)  Height: 5' (1.524 m)    Body mass index is 25.76 kg/m.  Physical Exam  Constitutional: Patient appears well-developed and well-nourished. ONo distress.  HEENT: head atraumatic, normocephalic, pupils equal and reactive to light, neck supple but pain with rom - feels stiff, throat within normal limits Cardiovascular: Normal rate, regular rhythm and normal heart sounds.  No murmur heard. No BLE edema. Pulmonary/Chest: Effort normal and breath sounds normal. No respiratory  distress. Abdominal: Soft.  There is no tenderness. Psychiatric: Patient has a normal mood and affect. behavior is normal. Judgment and thought content normal. Muscular Skeletal: decrease rom of left hip, also pain and decrease rom of neck   PHQ2/9: Depression screen [facility] 2/9 02/24/2016 10/01/2015 08/20/2015 07/10/2015 07/02/2015  Decreased Interest 0 0 1 1 0  Down, Depressed, Hopeless 0 0 0 0 0  PHQ - 2 Score 0 0 1 1 0     Fall Risk: Fall Risk  02/24/2016 10/01/2015 08/20/2015 07/23/2015 07/02/2015  Falls in the past year? No No - No No  Risk for fall due to : - - Impaired balance/gait;Impaired mobility - -     Assessment &  Plan  1. Cervical disc disease  - Ambulatory referral to Pain Clinic - already made  - gabapentin (NEURONTIN) 100 MG capsule; Take 1-3 capsules (100-300 mg total) by mouth 3 (three) times daily.  Dispense: 90 capsule; Refill: 0 - Oxycodone HCl 10 MG TABS; Take 1 tablet (10 mg total) by mouth 2 (two) times daily.  Dispense: 60 tablet; Refill: 0  2. Chronic pain  - Ambulatory referral to Pain Clinic - gabapentin (NEURONTIN) 100 MG capsule; Take 1-3 capsules (100-300 mg total) by mouth 3 (three) times daily.  Dispense: 90 capsule; Refill: 0 - Oxycodone HCl 10 MG TABS; Take 1 tablet (10 mg total) by mouth 2 (two) times daily.  Dispense: 60 tablet; Refill: 0

## 2016-04-20 NOTE — Telephone Encounter (Signed)
Sept 9 at 2pm, needs packet

## 2016-04-28 ENCOUNTER — Inpatient Hospital Stay: Payer: Commercial Managed Care - HMO

## 2016-04-28 ENCOUNTER — Inpatient Hospital Stay
Admission: EM | Admit: 2016-04-28 | Discharge: 2016-05-03 | DRG: 917 | Disposition: A | Discharge status: Home / Self Care | Payer: Commercial Managed Care - HMO | Attending: Internal Medicine | Admitting: Internal Medicine

## 2016-04-28 ENCOUNTER — Emergency Department: Payer: Commercial Managed Care - HMO

## 2016-04-28 ENCOUNTER — Encounter: Payer: Self-pay | Admitting: Emergency Medicine

## 2016-04-28 DIAGNOSIS — G8929 Other chronic pain: Secondary | ICD-10-CM | POA: Diagnosis present

## 2016-04-28 DIAGNOSIS — R531 Weakness: Secondary | ICD-10-CM | POA: Diagnosis not present

## 2016-04-28 DIAGNOSIS — E876 Hypokalemia: Secondary | ICD-10-CM | POA: Diagnosis present

## 2016-04-28 DIAGNOSIS — T401X1D Poisoning by heroin, accidental (unintentional), subsequent encounter: Secondary | ICD-10-CM | POA: Diagnosis not present

## 2016-04-28 DIAGNOSIS — G92 Toxic encephalopathy: Secondary | ICD-10-CM | POA: Diagnosis not present

## 2016-04-28 DIAGNOSIS — Z4682 Encounter for fitting and adjustment of non-vascular catheter: Secondary | ICD-10-CM | POA: Diagnosis not present

## 2016-04-28 DIAGNOSIS — E785 Hyperlipidemia, unspecified: Secondary | ICD-10-CM | POA: Diagnosis not present

## 2016-04-28 DIAGNOSIS — J9601 Acute respiratory failure with hypoxia: Secondary | ICD-10-CM | POA: Diagnosis not present

## 2016-04-28 DIAGNOSIS — T50901A Poisoning by unspecified drugs, medicaments and biological substances, accidental (unintentional), initial encounter: Secondary | ICD-10-CM

## 2016-04-28 DIAGNOSIS — Y92512 Supermarket, store or market as the place of occurrence of the external cause: Secondary | ICD-10-CM | POA: Diagnosis not present

## 2016-04-28 DIAGNOSIS — F1110 Opioid abuse, uncomplicated: Secondary | ICD-10-CM

## 2016-04-28 DIAGNOSIS — F068 Other specified mental disorders due to known physiological condition: Secondary | ICD-10-CM | POA: Diagnosis not present

## 2016-04-28 DIAGNOSIS — I2510 Atherosclerotic heart disease of native coronary artery without angina pectoris: Secondary | ICD-10-CM | POA: Diagnosis present

## 2016-04-28 DIAGNOSIS — F329 Major depressive disorder, single episode, unspecified: Secondary | ICD-10-CM | POA: Diagnosis not present

## 2016-04-28 DIAGNOSIS — T401X1A Poisoning by heroin, accidental (unintentional), initial encounter: Secondary | ICD-10-CM

## 2016-04-28 DIAGNOSIS — J9690 Respiratory failure, unspecified, unspecified whether with hypoxia or hypercapnia: Secondary | ICD-10-CM | POA: Diagnosis not present

## 2016-04-28 DIAGNOSIS — I517 Cardiomegaly: Secondary | ICD-10-CM | POA: Diagnosis present

## 2016-04-28 DIAGNOSIS — G9341 Metabolic encephalopathy: Secondary | ICD-10-CM

## 2016-04-28 DIAGNOSIS — N289 Disorder of kidney and ureter, unspecified: Secondary | ICD-10-CM | POA: Diagnosis present

## 2016-04-28 DIAGNOSIS — F332 Major depressive disorder, recurrent severe without psychotic features: Secondary | ICD-10-CM | POA: Diagnosis not present

## 2016-04-28 DIAGNOSIS — R0902 Hypoxemia: Secondary | ICD-10-CM

## 2016-04-28 DIAGNOSIS — J9600 Acute respiratory failure, unspecified whether with hypoxia or hypercapnia: Secondary | ICD-10-CM | POA: Diagnosis present

## 2016-04-28 DIAGNOSIS — R930 Abnormal findings on diagnostic imaging of skull and head, not elsewhere classified: Secondary | ICD-10-CM | POA: Diagnosis not present

## 2016-04-28 DIAGNOSIS — R4182 Altered mental status, unspecified: Secondary | ICD-10-CM

## 2016-04-28 DIAGNOSIS — F17210 Nicotine dependence, cigarettes, uncomplicated: Secondary | ICD-10-CM | POA: Diagnosis present

## 2016-04-28 DIAGNOSIS — F03B18 Unspecified dementia, moderate, with other behavioral disturbance: Secondary | ICD-10-CM

## 2016-04-28 DIAGNOSIS — R0689 Other abnormalities of breathing: Secondary | ICD-10-CM | POA: Diagnosis not present

## 2016-04-28 DIAGNOSIS — J189 Pneumonia, unspecified organism: Secondary | ICD-10-CM

## 2016-04-28 DIAGNOSIS — I10 Essential (primary) hypertension: Secondary | ICD-10-CM | POA: Diagnosis present

## 2016-04-28 DIAGNOSIS — R4020 Unspecified coma: Secondary | ICD-10-CM | POA: Diagnosis not present

## 2016-04-28 DIAGNOSIS — R4189 Other symptoms and signs involving cognitive functions and awareness: Secondary | ICD-10-CM

## 2016-04-28 DIAGNOSIS — Z4659 Encounter for fitting and adjustment of other gastrointestinal appliance and device: Secondary | ICD-10-CM

## 2016-04-28 DIAGNOSIS — F0391 Unspecified dementia with behavioral disturbance: Secondary | ICD-10-CM

## 2016-04-28 DIAGNOSIS — G9340 Encephalopathy, unspecified: Secondary | ICD-10-CM | POA: Diagnosis not present

## 2016-04-28 HISTORY — DX: Depression, unspecified: F32.A

## 2016-04-28 HISTORY — DX: Anxiety disorder, unspecified: F41.9

## 2016-04-28 HISTORY — DX: Major depressive disorder, single episode, unspecified: F32.9

## 2016-04-28 LAB — BLOOD GAS, ARTERIAL
ACID-BASE DEFICIT: 1.2 mmol/L (ref 0.0–2.0)
Allens test (pass/fail): POSITIVE — AB
BICARBONATE: 23.6 meq/L (ref 21.0–28.0)
FIO2: 0.5
LHR: 16 {breaths}/min
O2 SAT: 99.4 %
PEEP: 5 cmH2O
PH ART: 7.39 (ref 7.350–7.450)
Patient temperature: 37
VT: 450 mL
pCO2 arterial: 39 mmHg (ref 32.0–48.0)
pO2, Arterial: 161 mmHg — ABNORMAL HIGH (ref 83.0–108.0)

## 2016-04-28 LAB — LACTIC ACID, PLASMA
LACTIC ACID, VENOUS: 1.6 mmol/L (ref 0.5–1.9)
Lactic Acid, Venous: 2 mmol/L (ref 0.5–1.9)

## 2016-04-28 LAB — URINE DRUG SCREEN, QUALITATIVE (ARMC ONLY)
AMPHETAMINES, UR SCREEN: NOT DETECTED
BARBITURATES, UR SCREEN: NOT DETECTED
BENZODIAZEPINE, UR SCRN: POSITIVE — AB
COCAINE METABOLITE, UR ~~LOC~~: NOT DETECTED
Cannabinoid 50 Ng, Ur ~~LOC~~: NOT DETECTED
MDMA (Ecstasy)Ur Screen: NOT DETECTED
METHADONE SCREEN, URINE: NOT DETECTED
OPIATE, UR SCREEN: POSITIVE — AB
Phencyclidine (PCP) Ur S: NOT DETECTED
TRICYCLIC, UR SCREEN: NOT DETECTED

## 2016-04-28 LAB — ACETAMINOPHEN LEVEL: Acetaminophen (Tylenol), Serum: <10 ug/mL — ABNORMAL LOW (ref 10–30)

## 2016-04-28 LAB — COMPREHENSIVE METABOLIC PANEL
ALBUMIN: 3.9 g/dL (ref 3.5–5.0)
ALK PHOS: 83 U/L (ref 38–126)
ALT: 21 U/L (ref 14–54)
ANION GAP: 12 (ref 5–15)
AST: 60 U/L — ABNORMAL HIGH (ref 15–41)
BUN: 11 mg/dL (ref 6–20)
CHLORIDE: 101 mmol/L (ref 101–111)
CO2: 23 mmol/L (ref 22–32)
Calcium: 8.9 mg/dL (ref 8.9–10.3)
Creatinine, Ser: 1 mg/dL (ref 0.44–1.00)
GFR calc non Af Amer: 33 mL/min — ABNORMAL LOW (ref >60)
GFR, EST AFRICAN AMERICAN: 39 mL/min — AB (ref >60)
GLUCOSE: 292 mg/dL — AB (ref 65–99)
POTASSIUM: 3.3 mmol/L — AB (ref 3.5–5.1)
SODIUM: 136 mmol/L (ref 135–145)
Total Bilirubin: 0.8 mg/dL (ref 0.3–1.2)
Total Protein: 8.1 g/dL (ref 6.5–8.1)

## 2016-04-28 LAB — URINALYSIS COMPLETE WITH MICROSCOPIC (ARMC ONLY)
BILIRUBIN URINE: NEGATIVE
Bacteria, UA: NONE SEEN
GLUCOSE, UA: 50 mg/dL — AB
Ketones, ur: NEGATIVE mg/dL
Leukocytes, UA: NEGATIVE
NITRITE: NEGATIVE
Protein, ur: 30 mg/dL — AB
SPECIFIC GRAVITY, URINE: 1.01 (ref 1.005–1.030)
pH: 5 (ref 5.0–8.0)

## 2016-04-28 LAB — CBC WITH DIFFERENTIAL/PLATELET
BASOS ABS: 0 10*3/uL (ref 0–0.1)
EOS ABS: 0.6 10*3/uL (ref 0–0.7)
Eosinophils Relative: 3 %
HCT: 39.9 % (ref 35.0–47.0)
HEMOGLOBIN: 13.6 g/dL (ref 12.0–16.0)
Lymphocytes Relative: 60 %
Lymphs Abs: 12 10*3/uL — ABNORMAL HIGH (ref 1.0–3.6)
MCH: 33.1 pg (ref 26.0–34.0)
MCHC: 34 g/dL (ref 32.0–36.0)
MCV: 97.2 fL (ref 80.0–100.0)
MONO ABS: 1.4 10*3/uL — AB (ref 0.2–0.9)
Monocytes Relative: 7 %
NEUTROS ABS: 6 10*3/uL (ref 1.4–6.5)
PLATELETS: 316 10*3/uL (ref 150–440)
RBC: 4.11 MIL/uL (ref 3.80–5.20)
RDW: 13.9 % (ref 11.5–14.5)
WBC: 20.1 10*3/uL — ABNORMAL HIGH (ref 3.6–11.0)

## 2016-04-28 LAB — GLUCOSE, CAPILLARY
Glucose-Capillary: 74 mg/dL (ref 65–99)
Glucose-Capillary: 86 mg/dL (ref 65–99)

## 2016-04-28 LAB — MAGNESIUM
MAGNESIUM: 2 mg/dL (ref 1.7–2.4)
Magnesium: 1.8 mg/dL (ref 1.7–2.4)

## 2016-04-28 LAB — PROTIME-INR
INR: 1.05
Prothrombin Time: 13.7 seconds (ref 11.4–15.2)

## 2016-04-28 LAB — TROPONIN I

## 2016-04-28 LAB — PHOSPHORUS: Phosphorus: 3.6 mg/dL (ref 2.5–4.6)

## 2016-04-28 LAB — SALICYLATE LEVEL: Salicylate Lvl: <4 mg/dL (ref 2.8–30.0)

## 2016-04-28 LAB — PROCALCITONIN: Procalcitonin: <0.1 ng/mL

## 2016-04-28 LAB — LIPASE, BLOOD: LIPASE: 21 U/L (ref 11–51)

## 2016-04-28 LAB — ETHANOL

## 2016-04-28 MED ORDER — PROPOFOL 1000 MG/100ML IV EMUL
5.0000 ug/kg/min | Freq: Once | INTRAVENOUS | Status: DC
  Administered 2016-04-28: 5 ug/kg/min via INTRAVENOUS

## 2016-04-28 MED ORDER — MIDAZOLAM HCL 2 MG/2ML IJ SOLN
1.0000 mg | INTRAMUSCULAR | Status: DC | PRN
Start: 2016-04-28 — End: 2016-04-30
  Administered 2016-04-28: 1 mg via INTRAVENOUS
  Filled 2016-04-28: qty 2

## 2016-04-28 MED ORDER — PROPOFOL 1000 MG/100ML IV EMUL
INTRAVENOUS | Status: AC
  Administered 2016-04-28: 5 ug/kg/min via INTRAVENOUS
  Filled 2016-04-28: qty 100

## 2016-04-28 MED ORDER — HYDRALAZINE HCL 20 MG/ML IJ SOLN
10.0000 mg | Freq: Four times a day (QID) | INTRAMUSCULAR | Status: DC | PRN
  Administered 2016-04-30: 10 mg via INTRAVENOUS
  Filled 2016-04-28: qty 1

## 2016-04-28 MED ORDER — PRO-STAT SUGAR FREE PO LIQD
30.0000 mL | Freq: Two times a day (BID) | ORAL | Status: DC
  Administered 2016-04-28: 30 mL

## 2016-04-28 MED ORDER — ETOMIDATE 2 MG/ML IV SOLN
10.0000 mg | Freq: Once | INTRAVENOUS | Status: AC
  Administered 2016-04-28: 10 mg via INTRAVENOUS

## 2016-04-28 MED ORDER — ENOXAPARIN SODIUM 40 MG/0.4ML ~~LOC~~ SOLN
40.0000 mg | Freq: Every day | SUBCUTANEOUS | Status: DC
  Administered 2016-04-28: 40 mg via SUBCUTANEOUS
  Filled 2016-04-28: qty 0.4

## 2016-04-28 MED ORDER — FENTANYL 2500MCG IN NS 250ML (10MCG/ML) PREMIX INFUSION
100.0000 ug/h | INTRAVENOUS | Status: DC
  Administered 2016-04-28: 100 ug/h via INTRAVENOUS
  Administered 2016-04-29: 225 ug/h via INTRAVENOUS
  Filled 2016-04-28 (×2): qty 250

## 2016-04-28 MED ORDER — ANTISEPTIC ORAL RINSE SOLUTION (CORINZ)
7.0000 mL | OROMUCOSAL | Status: DC
  Administered 2016-04-28 – 2016-04-29 (×8): 7 mL via OROMUCOSAL
  Filled 2016-04-28 (×14): qty 7

## 2016-04-28 MED ORDER — INSULIN ASPART 100 UNIT/ML ~~LOC~~ SOLN: 0.0000 [IU] | SUBCUTANEOUS | Status: DC

## 2016-04-28 MED ORDER — CHLORHEXIDINE GLUCONATE 0.12% ORAL RINSE (MEDLINE KIT)
15.0000 mL | Freq: Two times a day (BID) | OROMUCOSAL | Status: DC
  Administered 2016-04-28 – 2016-04-29 (×2): 15 mL via OROMUCOSAL
  Filled 2016-04-28 (×3): qty 15

## 2016-04-28 MED ORDER — SODIUM CHLORIDE 0.9 % IV SOLN
3.0000 g | Freq: Four times a day (QID) | INTRAVENOUS | Status: DC
  Administered 2016-04-29 (×2): 3 g via INTRAVENOUS
  Filled 2016-04-28 (×5): qty 3

## 2016-04-28 MED ORDER — AMPICILLIN-SULBACTAM SODIUM 3 (2-1) G IJ SOLR
3.0000 g | INTRAMUSCULAR | Status: AC
  Administered 2016-04-28: 3 g via INTRAVENOUS
  Filled 2016-04-28: qty 3

## 2016-04-28 MED ORDER — POTASSIUM CHLORIDE 10 MEQ/100ML IV SOLN
10.0000 meq | INTRAVENOUS | Status: AC
  Administered 2016-04-28 – 2016-04-29 (×3): 10 meq via INTRAVENOUS
  Filled 2016-04-28 (×3): qty 100

## 2016-04-28 MED ORDER — ENOXAPARIN SODIUM 40 MG/0.4ML ~~LOC~~ SOLN: 40.0000 mg | SUBCUTANEOUS | Status: DC

## 2016-04-28 MED ORDER — FENTANYL CITRATE (PF) 100 MCG/2ML IJ SOLN: 50.0000 ug | INTRAMUSCULAR | Status: DC | PRN

## 2016-04-28 MED ORDER — PROPOFOL 1000 MG/100ML IV EMUL: 5.0000 ug/kg/min | INTRAVENOUS | Status: DC

## 2016-04-28 MED ORDER — ASPIRIN 300 MG RE SUPP: 300.0000 mg | RECTAL | Status: AC

## 2016-04-28 MED ORDER — ASPIRIN 81 MG PO CHEW: 324.0000 mg | CHEWABLE_TABLET | ORAL | Status: AC

## 2016-04-28 MED ORDER — MIDAZOLAM HCL 2 MG/2ML IJ SOLN
1.0000 mg | INTRAMUSCULAR | Status: DC | PRN
  Administered 2016-04-28 – 2016-04-29 (×2): 1 mg via INTRAVENOUS
  Filled 2016-04-28 (×2): qty 2

## 2016-04-28 MED ORDER — VITAL HIGH PROTEIN PO LIQD: 1000.0000 mL | ORAL | Status: DC

## 2016-04-28 MED ORDER — SENNOSIDES 8.8 MG/5ML PO SYRP
5.0000 mL | ORAL_SOLUTION | Freq: Two times a day (BID) | ORAL | Status: DC | PRN
  Filled 2016-04-28: qty 5

## 2016-04-28 MED ORDER — SODIUM CHLORIDE 0.9 % IV SOLN
250.0000 mL | INTRAVENOUS | Status: DC | PRN
  Administered 2016-04-30: 250 mL via INTRAVENOUS

## 2016-04-28 MED ORDER — ANTISEPTIC ORAL RINSE SOLUTION (CORINZ)
7.0000 mL | Freq: Four times a day (QID) | OROMUCOSAL | Status: DC
  Filled 2016-04-28 (×2): qty 7

## 2016-04-28 MED ORDER — IPRATROPIUM-ALBUTEROL 0.5-2.5 (3) MG/3ML IN SOLN: 3.0000 mL | Freq: Four times a day (QID) | RESPIRATORY_TRACT | Status: DC | PRN

## 2016-04-28 MED ORDER — SUCCINYLCHOLINE CHLORIDE 20 MG/ML IJ SOLN
100.0000 mg | Freq: Once | INTRAMUSCULAR | Status: AC
  Administered 2016-04-28: 100 mg via INTRAVENOUS

## 2016-04-28 MED ORDER — FAMOTIDINE IN NACL 20-0.9 MG/50ML-% IV SOLN
20.0000 mg | Freq: Two times a day (BID) | INTRAVENOUS | Status: DC
  Administered 2016-04-28 – 2016-04-30 (×4): 20 mg via INTRAVENOUS
  Filled 2016-04-28 (×5): qty 50

## 2016-04-28 NOTE — ED Notes (Signed)
Attempted report. CCU advised MD would like CT scan performed before transfer. Called CT, patient is next to be scanned.

## 2016-04-28 NOTE — ED Notes (Addendum)
Lactic acid 2 per lab,  MD notified

## 2016-04-28 NOTE — H&P (Signed)
PULMONARY / CRITICAL CARE MEDICINE   Name: Erica Stevens MRN: [id number] DOB: 09/12/1876    ADMISSION DATE:  04/28/2016 CONSULTATION DATE:  04/28/2016  REFERRING MD:  Dr. York Stevens  CHIEF COMPLAINT:  Loss of Consciousness and Respiratory Distress  HISTORY OF PRESENT ILLNESS:   Erica Stevens is an unidentified female that appears to be in her mid 6770's who presented to the ER via private vehicle for unresponsiveness hx is unknown.  Her friend brought her to the emergency room who does not know her very well.  Per ER notes according to the friend the pt took a drink of soda and seemed to choke then went unresponsive.  The patients friend than was able to get the pt in a car and drive her to the ER where she was immediately rushed back to an exam room.  Upon the ER physician's arrival to the room he noted the pt was breathing 5 spontaneous breaths per minute with O2 sats at 30% and unresponsive to painful stimulation requiring mechanical intubation.  According to ER notes she never lost her pulse.  PCCM consulted 04/28/16 due to acute hypoxic respiratory failure likely secondary to aspiration pneumonia and mechanical ventilator management.  PAST MEDICAL HISTORY :  She  has no past medical history on file.  PAST SURGICAL HISTORY: She  has no past surgical history on file.  Not on File  No current facility-administered medications on file prior to encounter.    No current outpatient prescriptions on file prior to encounter.    FAMILY HISTORY:  Her has no family status information on file.    SOCIAL HISTORY: She    REVIEW OF SYSTEMS:   Unable to assess pt intubated  SUBJECTIVE:  Pt is intubated and sedated on fentanyl and propofol drip  VITAL SIGNS: BP 136/72   Pulse 67   Resp 20   SpO2 100%   HEMODYNAMICS:    VENTILATOR SETTINGS: Vent Mode: AC FiO2 (%):  [50 %] 50 % Set Rate:  [16 bmp] 16 bmp Vt Set:  [450 mL] 450 mL PEEP:  [5 cmH20] 5 cmH20  INTAKE / OUTPUT: No  intake/output data recorded.  PHYSICAL EXAMINATION: General:  Acutely ill appearing female Neuro:  Sedated, does not follow commands, bilateral pupils 3 mm reactive HEENT:  Supple, no JVD Cardiovascular:  s1s2, rrr, no M/R/G Lungs:  Intubated, diminished throughout, even, non labored Abdomen:  +BS x4, soft, non tender, non distended Musculoskeletal:  Normal bulk and tone Skin:  Intact no rashes or lesions, surgical scar right medial shin  LABS:  BMET  Recent Labs Lab 04/28/16 1621  NA 136  K 3.3*  CL 101  CO2 23  BUN 11  CREATININE 1.00  GLUCOSE 292*    Electrolytes  Recent Labs Lab 04/28/16 1621  CALCIUM 8.9  MG 2.0    CBC  Recent Labs Lab 04/28/16 1621  WBC 20.1*  HGB 13.6  HCT 39.9  PLT 316    Coag's  Recent Labs Lab 04/28/16 1621  INR 1.05    Sepsis Markers No results for input(s): LATICACIDVEN, PROCALCITON, O2SATVEN in the last 168 hours.  ABG  Recent Labs Lab 04/28/16 1738  PHART 7.39  PCO2ART 39  PO2ART 161*    Liver Enzymes  Recent Labs Lab 04/28/16 1621  AST 60*  ALT 21  ALKPHOS 83  BILITOT 0.8  ALBUMIN 3.9    Cardiac Enzymes  Recent Labs Lab 04/28/16 1621  TROPONINI <0.03    Glucose No results for  input(s): GLUCAP in the last 168 hours.  Imaging Dg Chest Portable 1 View  Result Date: 04/28/2016 CLINICAL DATA:  Respiratory failure, tube placement EXAM: PORTABLE CHEST 1 VIEW COMPARISON:  None. FINDINGS: Borderline cardiomegaly. Study is limited by poor inspiration. No segmental infiltrate. No pulmonary edema. Mild basilar atelectasis. There is endotracheal tube in place with tip 1.1 cm above the carina. Status post median sternotomy. IMPRESSION: Limited study by poor inspiration. Endotracheal tube in place. No pneumothorax. Mild basilar atelectasis. No segmental infiltrate or pulmonary edema. Electronically Signed   By: Natasha MeadLiviu  Pop M.D.   On: 04/28/2016 17:03     STUDIES:  CT of head 8/17>>  CULTURES: Blood  8/17>> Urine 8/17>> Sputum 8/17>>  ANTIBIOTICS: Unasyn 8/17>>  SIGNIFICANT EVENTS: 8/17-Pt admitted [facility] ICU due to unresponsiveness and acute hypoxic respiratory failure likely secondary to aspiration pneumonia requiring mechanical ventilation PCCM consulted  LINES/TUBES: PIV's x2 8/17>>  ASSESSMENT / PLAN:  PULMONARY A: Acute hypoxic respiratory failure likely secondary to aspiration pneumonia Mechanical ventilation P:   Full vent support Maintain O2 sats 92% or above  SBT as tolerated VAP bundle CXR in am ABG in am  Prn Duonebs  CARDIOVASCULAR A:  Hypertension Unknown hx P:  Continuous telemetry monitoring  Trend troponin's Echo pending Maintain map >65 Prn hydralazine for hypertension  RENAL A:   No acute issues Unknown hx P:   Trend BMP's Replace electrolytes as indicated Monitor UOP  GASTROINTESTINAL A:   No acute issues Unknown hx P:   Pepcid for PUP Start tube feedings  HEMATOLOGIC A:   No acute issues Unknown hx P:  Lovenox for VTE prohylaxis Trend CBC's Monitor for s/sx of bleeding Transfuse for hgb <7  INFECTIOUS A:   Leukocytosis  ?Aspiration pneumonitis P:   Trend WBC's and monitor fever curve Trend PCT's and lactic acid ABX as listed above Aspiration precautions  ENDOCRINE A:   Hyperglycemia P:   CBG's q4hrs SSI  NEUROLOGIC A:   Acute encephalopathy post respiratory arrest Unknown hx P:   RASS goal: 0,1 Fentanyl gtt to maintain RASS goal Prn fentanyl for pain management Prn versed to maintain RASS goal WUA daily   FAMILY  - Updates: No family at bedside to update 04/28/16  - Inter-disciplinary family meet or Palliative Care meeting due by:  05/05/16  Erica Stevens, AGNP  Pulmonary/Critical Care Pager [phone] (please enter 7 [phone]) PCCM Consult Pager [phone] (please enter 7 [phone])  STAFF NOTE: I, Erica Stevens have personally reviewed patient's available data, including medical  history, events of note, physical examination and test results as part of my evaluation. I have discussed with NP Erica Stevens  and other care providers such as pharmacist, RN and RRT.    HPI: Erica Stevens with dec respirations and mentation after apparently "choking on soda" per "friend" who dropped her off to the ER.  Noted to have dec RR in the ER, electively intubated due to possible aspiration and airway protection. UDS positive for benzos and opioids.  Waking up this AM and following simple commands  PE: waking up, following simple commands.   A:"Erica Stevens" with acute respiratory failure possibly secondary aspiration event and drug OD  Acute hypoxic respiratory failure Metabolic Encephalopathy Hypokalemia Acute Renal insufficiency Atelectasis Mild Cardiomegaly Possible Drug overdose.   P:  - images reviewed upon admission and today, no significant infiltrates, basilar atelectasis noted.  - cont with unasyn for now - wean MV  - SAT/ SBT today, will attempt to wake  up patient and possibly extubate.   - cont with gentle IVFs - f/u ECHO   .  Rest per NP/medical resident whose note is outlined above and that I agree with  The patient is critically ill with multiple organ systems failure and requires high complexity decision making for assessment and support, frequent evaluation and titration of therapies, application of advanced monitoring technologies and extensive interpretation of multiple databases.   Critical Care Time devoted to patient care services described in this note is  45 Minutes.   This time reflects time of care of this signee Dr Stephanie Acre.  This critical care time does not reflect procedure time, or teaching time or supervisory time of PA/NP/Med-student/Med Resident etc but could involve care discussion time.  Stephanie Acre, MD [facility] Pager [phone] (please enter 7-digits) On Call Pager - [phone] (please enter  7-digits)  Note: This note was prepared with Dragon dictation along with smaller phrase technology. Any transcriptional errors that result from this process are unintentional.

## 2016-04-28 NOTE — ED Notes (Addendum)
Pt dropped off unrepsonsive by friend. Friend states she was "at gas station and pt was drinking a soda and started to cough". Friend drove pt to hospital

## 2016-04-28 NOTE — ED Notes (Signed)
Friend at bedside, unaware of pt last name or any demographic info on pt. MD aware

## 2016-04-28 NOTE — ED Notes (Signed)
Pt transporting with RN and RT to CCU from ED, stopping by CT scan,

## 2016-04-28 NOTE — ED Notes (Signed)
 10mg  Etomidate pushed at this time

## 2016-04-28 NOTE — Progress Notes (Signed)
Pt arrived to the floor from the ED, intubated. DC'd propofol per NP Annabelle Harmanana. Pt responsive and following commands.

## 2016-04-28 NOTE — ED Notes (Signed)
 100mg  Succ pushed at this time

## 2016-04-28 NOTE — ED Provider Notes (Signed)
[facility] Emergency Department Provider Note  ____________________________________________   First MD Initiated Contact with Patient 04/28/16 1613     (approximate)  I have reviewed the triage vital signs and the nursing notes.   HISTORY  Chief Complaint Loss of Consciousness and Respiratory Distress  Critical care level V caveat-the patient cannot provide any history as she is currently unresponsive and in respiratory failure  HPI Erica Stevens is an unidentified woman that appears to be in her early 57s who presents to the emergency Department by private vehicle for unresponsiveness.  I was told that she was with a friend who does not know her well at the store.  The patient took a drink of soda and seemed to choke and then went unresponsive.Somehow the patient's friend was able to get her in a car and drive her to the front where she was immediately rushed back to an exam room and I was called to the room.  When I arrived the patient was toxic appearing with at the most 5 spontaneous breaths per minute.  She was unresponsive to painful stimuli.  She did have a strong femoral pulse.  No additional history is available at this time.  See hospital course for more details.   No past medical history on file.  There are no active problems to display for this patient.   No past surgical history on file.  Prior to Admission medications   Not on File    Allergies Review of patient's allergies indicates not on file.  No family history on file.  Social History Social History  Substance Use Topics  . Smoking status: Not on file  . Smokeless tobacco: Not on file  . Alcohol use Not on file    Review of Systems Level V critical care caveat-unable to obtain review of systems due to critical illness and respiratory failure ____________________________________________   PHYSICAL EXAM:  VITAL SIGNS: ED Triage Vitals  Enc Vitals Group     BP  04/28/16 1619 (!) 219/75     Pulse Rate 04/28/16 1618 97     Resp 04/28/16 1630 (!) 23     Temp --      Temp src --      SpO2 04/28/16 1630 96 %     Weight --      Height --      Head Circumference --      Peak Flow --      Pain Score --      Pain Loc --      Pain Edu? --      Excl. in GC? --     Constitutional: Toxic appearance, unresponsive with only a very occasional spontaneous breath Eyes: Conjunctivae are normal.  Pupils are normal in size and unresponsive to light Head: Atraumatic. Mouth/Throat: Mucous membranes are moist with a significant amount of thick saliva or mucus in her oropharynx.  Top dentures were present and removed upon intubation Neck: No stridor.  No meningeal signs.   Cardiovascular: Normal rate, regular rhythm. Good peripheral circulation with brisk femoral pulse. Grossly normal heart sounds.   Respiratory: Occasional spontaneous breath, no more than 5/m.  Lungs are clear to auscultation bilaterally  Gastrointestinal: Soft And nondistended  Musculoskeletal: No lower extremity tenderness nor edema. No gross deformities of extremities. Neurologic:  Unable to participate in neurologic exam.  No corneal reflex, no response to painful stimulus  Skin:  Skin is warm, dry and intact. No rash noted.  ____________________________________________   LABS (all labs ordered are listed, but only abnormal results are displayed)  Labs Reviewed  CBC WITH DIFFERENTIAL/PLATELET - Abnormal; Notable for the following:       Result Value   WBC 20.1 (*)    Lymphs Abs 12.0 (*)    Monocytes Absolute 1.4 (*)    All other components within normal limits  COMPREHENSIVE METABOLIC PANEL - Abnormal; Notable for the following:    Potassium 3.3 (*)    Glucose, Bld 292 (*)    AST 60 (*)    GFR calc non Af Amer 33 (*)    GFR calc Af Amer 39 (*)    All other components within normal limits  PROTIME-INR  LIPASE, BLOOD  TROPONIN I  MAGNESIUM  URINALYSIS COMPLETEWITH MICROSCOPIC  ([facility] ONLY)  ETHANOL  ACETAMINOPHEN LEVEL  SALICYLATE LEVEL  URINE DRUG SCREEN, QUALITATIVE ([facility] ONLY)  LACTIC ACID, PLASMA  LACTIC ACID, PLASMA  BLOOD GAS, ARTERIAL   ____________________________________________  EKG  ED ECG REPORT I, Illana Nolting, the attending physician, personally viewed and interpreted this ECG.  Date: 04/28/2016 EKG Time: 16:19 Rate: 104 Rhythm: Sinus tachycardia QRS Axis: normal Intervals: Borderline first-degree AV block with a PR interval of 208 ms ST/T Wave abnormalities: Non-specific ST segment / T-wave changes, but no evidence of acute ischemia. Conduction Disturbances: none Narrative Interpretation: unremarkable  ____________________________________________  RADIOLOGY I, Larren Copes, personally viewed and evaluated these images (plain radiographs) as part of my medical decision making, as well as reviewing the written report by the radiologist.   Dg Chest Portable 1 View  Result Date: 04/28/2016 CLINICAL DATA:  Respiratory failure, tube placement EXAM: PORTABLE CHEST 1 VIEW COMPARISON:  None. FINDINGS: Borderline cardiomegaly. Study is limited by poor inspiration. No segmental infiltrate. No pulmonary edema. Mild basilar atelectasis. There is endotracheal tube in place with tip 1.1 cm above the carina. Status post median sternotomy. IMPRESSION: Limited study by poor inspiration. Endotracheal tube in place. No pneumothorax. Mild basilar atelectasis. No segmental infiltrate or pulmonary edema. Electronically Signed   By: Natasha MeadLiviu  Pop M.D.   On: 04/28/2016 17:03    ____________________________________________   PROCEDURES  Procedure(s) performed:   .Intubation Date/Time: 04/28/2016 5:34 PM Performed by: Loleta RoseFORBACH, Aaiden Depoy Authorized by: Loleta RoseFORBACH, Shakiya Mcneary   Consent:    Consent obtained:  Emergent situation Pre-procedure details:    Patient status:  Unresponsive   Pretreatment meds: etomidate 10 mg given prior to paralytic.   Paralytics:   Succinylcholine (100 mg) Procedure details:    Preoxygenation:  Bag valve mask   CPR in progress: no     Intubation method:  Oral   Oral intubation technique:  Video-assisted   Laryngoscope blade:  Mac 3   Tube size (mm):  7.5   Tube type:  Cuffed   Number of attempts:  1   Cricoid pressure: no     Tube visualized through cords: yes   Placement assessment:    ETT to teeth:  22 cm   Tube secured with:  ETT holder   Breath sounds:  Equal and absent over the epigastrium   Placement verification: chest rise, CXR verification, equal breath sounds and ETCO2 detector     CXR findings:  ETT in proper place Post-procedure details:    Patient tolerance of procedure:  Tolerated well, no immediate complications .Critical Care Performed by: Loleta RoseFORBACH, Kedra Mcglade Authorized by: Loleta RoseFORBACH, Zaydin Billey   Critical care provider statement:    Critical care time (minutes):  60   Critical care time was  exclusive of:  Separately billable procedures and treating other patients   Critical care was necessary to treat or prevent imminent or life-threatening deterioration of the following conditions:  Respiratory failure   Critical care was time spent personally by me on the following activities:  Development of treatment plan with patient or surrogate, discussions with consultants, evaluation of patient's response to treatment, examination of patient, obtaining history from patient or surrogate, ordering and performing treatments and interventions, ordering and review of laboratory studies, ordering and review of radiographic studies, pulse oximetry, re-evaluation of patient's condition and review of old charts     Critical Care performed: Yes, see critical care procedure note(s) ____________________________________________   INITIAL IMPRESSION / ASSESSMENT AND PLAN / ED COURSE  Pertinent labs & imaging results that were available during my care of the patient were reviewed by me and considered in my medical decision  making (see chart for details).  Critical ill patient with respiratory failure and unresponsive to painful stimuli.  I made the decision to immediately intubate the patient and the procedure went smoothly.  When she first arrived her SPO2 was in the low 30s but I was able to BVM her back up to the mid to upper 90s before attempting the intubation.  She did not desaturate during the intubation attempt and stated in the mid 90s during the one-pass kaleidoscope intubation.  The patient does not have a wallet and no additional information is currently available.  Broad laboratory evaluation is pending.   Clinical Course  Comment By Time  The patient is fighting the vent and propofol is being titrated up.  She remains hemodynamically stable.  Her labs so far are reassuring except for a leukocytosis of 20 which may represent a stress reaction to her respiratory arrest and intubation.  Given the possibility of aspiration as well as her leukocytosis I am giving her a dose of Unasyn 3 g IV.  Otherwise her workup is unremarkable.  I spoke by phone with the nurse practitioner from the ICU who will come down and admit the patient Loleta Roseory Josha Weekley, MD 08/17 1751    ____________________________________________  FINAL CLINICAL IMPRESSION(S) / ED DIAGNOSES  Final diagnoses:  Acute respiratory failure with hypoxia (HCC)  Unresponsive     MEDICATIONS GIVEN DURING THIS VISIT:  Medications  fentaNYL 2500mcg in NS 250mL (2910mcg/ml) infusion-PREMIX (125 mcg/hr Intravenous Rate/Dose Change 04/28/16 1714)  Ampicillin-Sulbactam (UNASYN) 3 g in sodium chloride 0.9 % 100 mL IVPB (not administered)  propofol (DIPRIVAN) 1000 MG/100ML infusion (35 mcg/kg/min Intravenous Rate/Dose Change 04/28/16 1737)  etomidate (AMIDATE) injection 10 mg (10 mg Intravenous Given 04/28/16 1621)  succinylcholine (ANECTINE) injection 100 mg (100 mg Intravenous Given 04/28/16 1620)     NEW OUTPATIENT MEDICATIONS STARTED DURING THIS  VISIT:  New Prescriptions   No medications on file      Note:  This document was prepared using Dragon voice recognition software and may include unintentional dictation errors.    Loleta Roseory Jacalyn Biggs, MD 04/28/16 [phone]

## 2016-04-28 NOTE — ED Notes (Addendum)
Pt 27% on RA, being bagged at this time by MD. Pt 99 pulse. RT at bedside

## 2016-04-28 NOTE — Progress Notes (Signed)
Pharmacy Antibiotic Note  [place] Qq Doe is a [age] y.o. female admitted on 04/28/2016 with pneumonia.  Pharmacy has been consulted for Unasyn dosing.  Plan: CrCl approximately 40 mL/min (based on age approximately [age], SCr 1, height 5'3", and weight 60 kg). Start Unasyn 3 gm IV Q6H.  Height: 5\' 3"  (160 cm) Weight: 130 lb 15.3 oz (59.4 kg) IBW/kg (Calculated) : 52.4  No data recorded.   Recent Labs Lab 04/28/16 1621 04/28/16 1727 04/28/16 1951  WBC 20.1*  --   --   CREATININE 1.00  --   --   LATICACIDVEN  --  2.0* 1.6    Estimated Creatinine Clearance: 0 mL/min (by C-G formula based on SCr of 1 mg/dL).    Not on File  Thank you for allowing pharmacy to be a part of this patient's care.  Carola FrostNathan A Ranen Doolin, Pharm.D., BCPS Clinical Pharmacist  04/28/2016 10:32 PM

## 2016-04-28 NOTE — ED Notes (Addendum)
Pt trying to sitting up, eyes opened, groaning. Fentanyl and propofol drips  titrated at this time. VS stable

## 2016-04-29 ENCOUNTER — Inpatient Hospital Stay: Admit: 2016-04-29 | Payer: Commercial Managed Care - HMO

## 2016-04-29 ENCOUNTER — Inpatient Hospital Stay: Payer: Commercial Managed Care - HMO

## 2016-04-29 LAB — CBC
HCT: 33.4 % — ABNORMAL LOW (ref 35.0–47.0)
Hemoglobin: 11.4 g/dL — ABNORMAL LOW (ref 12.0–16.0)
MCH: 32.4 pg (ref 26.0–34.0)
MCHC: 34.2 g/dL (ref 32.0–36.0)
MCV: 94.7 fL (ref 80.0–100.0)
Platelets: 186 10*3/uL (ref 150–440)
RBC: 3.52 MIL/uL — ABNORMAL LOW (ref 3.80–5.20)
RDW: 13.3 % (ref 11.5–14.5)
WBC: 11.4 10*3/uL — ABNORMAL HIGH (ref 3.6–11.0)

## 2016-04-29 LAB — GLUCOSE, CAPILLARY
GLUCOSE-CAPILLARY: 66 mg/dL (ref 65–99)
GLUCOSE-CAPILLARY: 86 mg/dL (ref 65–99)
GLUCOSE-CAPILLARY: 95 mg/dL (ref 65–99)
Glucose-Capillary: 80 mg/dL (ref 65–99)
Glucose-Capillary: 116 mg/dL — ABNORMAL HIGH (ref 65–99)
Glucose-Capillary: 132 mg/dL — ABNORMAL HIGH (ref 65–99)
Glucose-Capillary: 106 mg/dL — ABNORMAL HIGH (ref 65–99)

## 2016-04-29 LAB — BLOOD GAS, ARTERIAL
Acid-Base Excess: 2.8 mmol/L (ref 0.0–3.0)
BICARBONATE: 25.7 meq/L (ref 21.0–28.0)
FIO2: 0.3
MECHANICAL RATE: 16
MECHVT: 450 mL
O2 Saturation: 99.2 %
PATIENT TEMPERATURE: 37
PEEP: 5 cmH2O
PO2 ART: 128 mmHg — AB (ref 83.0–108.0)
pCO2 arterial: 33 mmHg (ref 32.0–48.0)
pH, Arterial: 7.5 — ABNORMAL HIGH (ref 7.350–7.450)

## 2016-04-29 LAB — BASIC METABOLIC PANEL
ANION GAP: 3 — AB (ref 5–15)
BUN: 14 mg/dL (ref 6–20)
CALCIUM: 8.1 mg/dL — AB (ref 8.9–10.3)
CO2: 24 mmol/L (ref 22–32)
Chloride: 108 mmol/L (ref 101–111)
Creatinine, Ser: 0.86 mg/dL (ref 0.44–1.00)
GFR, EST AFRICAN AMERICAN: 46 mL/min — AB (ref >60)
GFR, EST NON AFRICAN AMERICAN: 40 mL/min — AB (ref >60)
Glucose, Bld: 203 mg/dL — ABNORMAL HIGH (ref 65–99)
Potassium: 3.8 mmol/L (ref 3.5–5.1)
Sodium: 135 mmol/L (ref 135–145)

## 2016-04-29 LAB — MAGNESIUM: Magnesium: 1.6 mg/dL — ABNORMAL LOW (ref 1.7–2.4)

## 2016-04-29 LAB — PHOSPHORUS: Phosphorus: 3.1 mg/dL (ref 2.5–4.6)

## 2016-04-29 LAB — PROCALCITONIN

## 2016-04-29 MED ORDER — OXYCODONE HCL 5 MG PO TABS
5.0000 mg | ORAL_TABLET | Freq: Two times a day (BID) | ORAL | Status: DC
  Administered 2016-04-29 – 2016-05-03 (×8): 5 mg via ORAL
  Filled 2016-04-29 (×8): qty 1

## 2016-04-29 MED ORDER — CETYLPYRIDINIUM CHLORIDE 0.05 % MT LIQD
7.0000 mL | Freq: Two times a day (BID) | OROMUCOSAL | Status: DC
  Administered 2016-04-29 – 2016-05-03 (×4): 7 mL via OROMUCOSAL

## 2016-04-29 MED ORDER — DEXTROSE 50 % IV SOLN
25.0000 mL | Freq: Once | INTRAVENOUS | Status: AC
  Administered 2016-04-29: 25 mL via INTRAVENOUS
  Filled 2016-04-29: qty 50

## 2016-04-29 MED ORDER — GABAPENTIN 100 MG PO CAPS
100.0000 mg | ORAL_CAPSULE | Freq: Three times a day (TID) | ORAL | Status: DC
  Administered 2016-04-29 – 2016-05-03 (×12): 100 mg via ORAL
  Filled 2016-04-29 (×12): qty 1

## 2016-04-29 NOTE — Progress Notes (Signed)
Update: Reassess for extubation Fully awake and following command CTAB, no wheezes, vt~1000c Appropriate chin to chest maneuver   Plan: Extubate Maintain O2sats>88%   Stephanie AcreVishal Josias Tomerlin, MD [facility] Pager [phone]- [phone] (please enter 7-digits) On Call Pager - [phone] (please enter 7-digits)

## 2016-04-29 NOTE — Progress Notes (Signed)
Initial Nutrition Assessment  DOCUMENTATION CODES:   Not applicable  INTERVENTION:  -Await diet progression with further recommendations to follow   NUTRITION DIAGNOSIS:   Inadequate oral intake related to acute illness as evidenced by NPO status.  GOAL:   Provide needs based on ASPEN/SCCM guidelines  MONITOR:   Vent status, Labs, Weight trends  REASON FOR ASSESSMENT:   Consult Enteral/tube feeding initiation and management  ASSESSMENT:    Unidentified female admitted unresponsve with acute respiratory failure requiring intubation on 8/17.   History unknown at present. No family present. Pt extubated today, tolerating 2L [place]  Diet Order:  Diet NPO time specified  Skin:  Reviewed, no issues  Last BM:  no documented BM   Labs: reviewqd  Meds: reviewed  Height:   Ht Readings from Last 1 Encounters:  04/28/16 5\' 3"  (1.6 m)    Weight:   Wt Readings from Last 1 Encounters:  04/29/16 131 lb 2.8 oz (59.5 kg)    Filed Weights   04/28/16 2100 04/29/16 0219  Weight: 130 lb 15.3 oz (59.4 kg) 131 lb 2.8 oz (59.5 kg)    BMI:  Body mass index is 23.24 kg/m.  Estimated Nutritional Needs:   Kcal:  evaluate on follow when more accurate info obtained  Protein:  90-120 g   Fluid:  >/= 1.8 L  EDUCATION NEEDS:   No education needs identified at this time  Romelle StarcherCate Jermery Caratachea MS, RD, LDN [phone] Pager  [phone] Weekend/On-Call Pager

## 2016-04-29 NOTE — Progress Notes (Signed)
Update: Patient awake and alert, still cannot recall events of yesterday after 9am.  Social worker has spoken with patient and her daughter (see Child psychotherapistsocial worker note for details).   PMD - Dr. Thana AtesMorrisey  Active problem list based on last office visit, with Dr. Carlynn PurlSowles 04/20/16 . Angina pectoris (HCC) 02/24/2016  . Cervical disc disease 10/02/2015  . Affective disorder, major (HCC) 07/02/2015  . 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 Benign hypertensive heart disease 03/09/2015  . Migraine 03/09/2015  . Billowing mitral valve 03/09/2015  . Osteopetrosis 03/09/2015  . Urge incontinence 03/09/2015  . Chest pressure 06/21/2013  . Leg edema 06/21/2013  . Shortness of breath 06/21/2013  . CAD (coronary artery disease) of artery bypass graft 06/21/2013  . Hyperlipidemia 06/21/2013  . Chronic pain 03/25/2009  . Hypercholesterolemia without hypertriglyceridemia 05/22/2007  . Coronary atherosclerosis 05/22/2007  . 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 Brachial neuritis    Follow with Pain clinic, Dr. Laban EmperorNaveira  Review of chart shows that she was given Oxycodone 10mg  BID, and Neurontin 1-3 tabs (100mg ) TID. Review of  Control Reporting System by pharmacist shows that she does get her prescription from the pain clinic.  Plan: 1. Patient passed bedside swallow eval, start die 2. Restart pain meds at reduced dose, avoid withdrawal - Oxycodone 5mg  BID, Neurontin 100mg  TID 3. I have spoken with hospitalist, Dr. Amado CoeGouru, who has accepted patient to hospitalist team 8/19 AM.  4. Will now make step down status.   Stephanie AcreVishal Josephanthony Tindel, MD [facility] Pager [phone]- [phone] (please enter 7-digits) On Call Pager - [phone] (please enter 7-digits)
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
## 2016-04-29 NOTE — Progress Notes (Signed)
Patient extubated at approximately 1210. Bilateral lung sounds clear, tolerating 2L. No complaints currently, patient's identity obtained and family updated by Dr. Dema SeverinMungal. Will continue to assess. Trudee KusterBrandi R Mansfield

## 2016-04-29 NOTE — Progress Notes (Signed)
Patient passed swallow screen, per Dr. Dema SeverinMungal okay to place patient on heart healthy diet. Trudee KusterBrandi R Mansfield
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
## 2016-04-30 ENCOUNTER — Inpatient Hospital Stay
Admit: 2016-04-30 | Discharge: 2016-04-30 | Disposition: A | Discharge status: Home / Self Care | Payer: Commercial Managed Care - HMO | Attending: Critical Care Medicine | Admitting: Critical Care Medicine

## 2016-04-30 LAB — PROCALCITONIN

## 2016-04-30 LAB — BASIC METABOLIC PANEL
ANION GAP: 6 (ref 5–15)
BUN: 9 mg/dL (ref 6–20)
CO2: 28 mmol/L (ref 22–32)
Calcium: 8.5 mg/dL — ABNORMAL LOW (ref 8.9–10.3)
Chloride: 107 mmol/L (ref 101–111)
Creatinine, Ser: 0.74 mg/dL (ref 0.44–1.00)
GFR calc non Af Amer: >60 mL/min (ref >60)
Glucose, Bld: 89 mg/dL (ref 65–99)
POTASSIUM: 3.9 mmol/L (ref 3.5–5.1)
SODIUM: 141 mmol/L (ref 135–145)

## 2016-04-30 LAB — GLUCOSE, CAPILLARY
GLUCOSE-CAPILLARY: 90 mg/dL (ref 65–99)
GLUCOSE-CAPILLARY: 165 mg/dL — AB (ref 65–99)
Glucose-Capillary: 87 mg/dL (ref 65–99)
Glucose-Capillary: 120 mg/dL — ABNORMAL HIGH (ref 65–99)
Glucose-Capillary: 128 mg/dL — ABNORMAL HIGH (ref 65–99)

## 2016-04-30 LAB — URINE CULTURE: Culture: NO GROWTH

## 2016-04-30 LAB — PHOSPHORUS: PHOSPHORUS: 3.1 mg/dL (ref 2.5–4.6)

## 2016-04-30 LAB — MAGNESIUM: Magnesium: 1.6 mg/dL — ABNORMAL LOW (ref 1.7–2.4)

## 2016-04-30 MED ORDER — NICOTINE 21 MG/24HR TD PT24
21.0000 mg | MEDICATED_PATCH | Freq: Every day | TRANSDERMAL | Status: DC
  Administered 2016-04-30 – 2016-05-03 (×4): 21 mg via TRANSDERMAL
  Filled 2016-04-30 (×4): qty 1

## 2016-04-30 MED ORDER — MAGNESIUM SULFATE 2 GM/50ML IV SOLN
2.0000 g | Freq: Once | INTRAVENOUS | Status: AC
Start: 2016-04-30 — End: 2016-04-30
  Administered 2016-04-30: 2 g via INTRAVENOUS
  Filled 2016-04-30: qty 50

## 2016-04-30 MED ORDER — AMLODIPINE BESYLATE 5 MG PO TABS
5.0000 mg | ORAL_TABLET | Freq: Every day | ORAL | Status: DC
  Administered 2016-04-30 – 2016-05-03 (×4): 5 mg via ORAL
  Filled 2016-04-30 (×4): qty 1
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
## 2016-04-30 NOTE — Progress Notes (Signed)
[facility] FOR AM LAB REPLACEMENT ONLY  The patient does apply for the [facility] Electrolyte Replacment Protocol based on the criteria listed below:   1. Is GFR >/= 40 ml/min? Yes.    Patient's GFR today is 40  2. Is urine output >/= 0.5 ml/kg/hr for the last 6 hours? Yes.   Patient's UOP is 3.0 ml/kg/hr 3. Is BUN < 60 mg/dL? Yes.    Patient's BUN today is 14 4. Abnormal electrolyte(s): Mg 1.6 5. Ordered repletion with: per protocol 6. If a panic level lab has been reported, has the CCM MD in charge been notified? No..   Physician:    Markus DaftWHELAN, Donie Moulton A 04/30/2016 6:00 AM

## 2016-04-30 NOTE — Progress Notes (Signed)
Dr. Renae GlossWieting notified patient's bp is 210/80. Acknowledged and placed in orders.

## 2016-04-30 NOTE — Progress Notes (Signed)
Dr. Judithann SheenSparks notified of patient's request for nicotine patch. Stated to order 21 mg patch.

## 2016-04-30 NOTE — Progress Notes (Signed)
Daughter of patient stated her mom voiced she doesn't take  her prescribed meds because she didn't want to live anymore. Dr. Renae GlossWieting notified.

## 2016-05-01 DIAGNOSIS — F332 Major depressive disorder, recurrent severe without psychotic features: Secondary | ICD-10-CM

## 2016-05-01 LAB — ECHOCARDIOGRAM COMPLETE
Height: 63 in
WEIGHTICAEL: 2179.91 [oz_av]

## 2016-05-01 LAB — MAGNESIUM: Magnesium: 2 mg/dL (ref 1.7–2.4)

## 2016-05-01 MED ORDER — MIRTAZAPINE 15 MG PO TABS
15.0000 mg | ORAL_TABLET | Freq: Every day | ORAL | Status: DC
  Administered 2016-05-01 – 2016-05-02 (×2): 15 mg via ORAL
  Filled 2016-05-01 (×2): qty 1

## 2016-05-01 MED ORDER — TRAZODONE HCL 100 MG PO TABS: 50.0000 mg | ORAL_TABLET | Freq: Every evening | ORAL | Status: DC | PRN

## 2016-05-01 MED ORDER — OXYCODONE HCL 5 MG PO TABS: 5.0000 mg | ORAL_TABLET | Freq: Two times a day (BID) | ORAL | 0 refills | Status: DC

## 2016-05-01 MED ORDER — ALBUTEROL SULFATE HFA 108 (90 BASE) MCG/ACT IN AERS: 2.0000 | INHALATION_SPRAY | Freq: Four times a day (QID) | RESPIRATORY_TRACT | 2 refills | Status: DC | PRN

## 2016-05-01 MED ORDER — TRAZODONE HCL 50 MG PO TABS: 50.0000 mg | ORAL_TABLET | Freq: Every evening | ORAL | Status: DC | PRN

## 2016-05-01 MED ORDER — MIRTAZAPINE 15 MG PO TABS: 15.0000 mg | ORAL_TABLET | Freq: Every day | ORAL | Status: DC

## 2016-05-01 MED ORDER — NICOTINE 21 MG/24HR TD PT24: 21.0000 mg | MEDICATED_PATCH | Freq: Every day | TRANSDERMAL | 0 refills | Status: DC

## 2016-05-01 MED ORDER — DIPHENHYDRAMINE HCL 25 MG PO CAPS
25.0000 mg | ORAL_CAPSULE | Freq: Four times a day (QID) | ORAL | Status: DC | PRN
  Administered 2016-05-01: 25 mg via ORAL
  Filled 2016-05-01: qty 1

## 2016-05-01 NOTE — Progress Notes (Addendum)
[facility] PROGRESS NOTE  Erica Stevens ZOX:[id number] Halim:[id number] DOB: 07-Jun-1951 DOA: 04/28/2016 PCP: Dennison MascotLemont Morrisey, MD  HPI/Subjective: Patient seen by psychiatry and recommended further psychiatric monitoring. Patient physically feels some pain in her shoulders and her neck which is chronic for her. She thanked me for working with her.  Objective: Vitals:   05/01/16 0909 05/01/16 1259  BP: (!) 155/49 (!) 142/43  Pulse: 68 70  Resp:  18  Temp: 98.2 F (36.8 C) 98.3 F (36.8 C)    Filed Weights   04/28/16 2100 04/29/16 0219 04/30/16 0600  Weight: 59.4 kg (130 lb 15.3 oz) 59.5 kg (131 lb 2.8 oz) 61.8 kg (136 lb 3.9 oz)    ROS: Review of Systems  Constitutional: Negative for chills and fever.  Eyes: Negative for blurred vision.  Respiratory: Negative for cough and shortness of breath.   Cardiovascular: Negative for chest pain.  Gastrointestinal: Negative for abdominal pain, constipation, diarrhea, nausea and vomiting.  Genitourinary: Negative for dysuria.  Musculoskeletal: Negative for joint pain.  Neurological: Negative for dizziness and headaches.  Psychiatric/Behavioral: Positive for depression and substance abuse. Negative for suicidal ideas.   Exam: Physical Exam  Constitutional: She is oriented to person, place, and time.  Neck: No tracheal tenderness present. Carotid bruit is not present. No edema present.  Cardiovascular:  Murmur heard.  Systolic murmur is present with a grade of 2/6  Respiratory: She has wheezes in the right lower field and the left lower field. She has no rhonchi. She has no rales.  GI: Soft. There is no tenderness.  Musculoskeletal:       Right ankle: She exhibits no swelling.       Left ankle: She exhibits no swelling.  Lymphadenopathy:    She has no cervical adenopathy.  Neurological: She is alert and oriented to person, place, and time. No cranial nerve deficit.  Skin: Skin is dry. No rash noted.  Psychiatric: She exhibits a depressed mood.       Data Reviewed: Basic Metabolic Panel:  Recent Labs Lab 04/28/16 1621 04/28/16 1951 04/29/16 0404 04/29/16 1659 04/30/16 0429 05/01/16 0411  NA 136  --  135  --  141  --   K 3.3*  --  3.8  --  3.9  --   CL 101  --  108  --  107  --   CO2 23  --  24  --  28  --   GLUCOSE 292*  --  203*  --  89  --   BUN 11  --  14  --  9  --   CREATININE 1.00  --  0.86  --  0.74  --   CALCIUM 8.9  --  8.1*  --  8.5*  --   MG 2.0 1.8  --  1.6* 1.6* 2.0  PHOS  --  3.6  --  3.1 3.1  --    Liver Function Tests:  Recent Labs Lab 04/28/16 1621  AST 60*  ALT 21  ALKPHOS 83  BILITOT 0.8  PROT 8.1  ALBUMIN 3.9    Recent Labs Lab 04/28/16 1621  LIPASE 21   CBC:  Recent Labs Lab 04/28/16 1621 04/29/16 0404  WBC 20.1* 11.4*  NEUTROABS 6.0  --   HGB 13.6 11.4*  HCT 39.9 33.4*  MCV 97.2 94.7  PLT 316 186   Cardiac Enzymes:  Recent Labs Lab 04/28/16 1621  TROPONINI <0.03    CBG:  Recent Labs Lab 04/30/16 0322 04/30/16 0738 04/30/16  1143 04/30/16 1559 04/30/16 1955  GLUCAP 90 87 165* 120* 128*    Recent Results (from the past 240 hour(s))  Urine culture     Status: None   Collection Time: 04/28/16  6:30 PM  Result Value Ref Range Status   Specimen Description URINE, RANDOM  Final   Special Requests NONE  Final   Culture [facility] GROWTH [facility]   Final   Report Status 04/30/2016 FINAL  Final  CULTURE, BLOOD (ROUTINE X 2) w Reflex to ID Panel     Status: None (Preliminary result)   Collection Time: 04/28/16  7:51 PM  Result Value Ref Range Status   Specimen Description BLOOD LEFT WRIST  Final   Special Requests BOTTLES DRAWN AEROBIC AND ANAEROBIC 3CC  Final   Culture NO GROWTH 3 DAYS  Final   Report Status PENDING  Incomplete  CULTURE, BLOOD (ROUTINE X 2) w Reflex to ID Panel     Status: None (Preliminary result)   Collection Time: 04/28/16  7:51 PM  Result Value Ref Range Status   Specimen Description BLOOD LEFT HAND  Final    Special Requests BOTTLES DRAWN AEROBIC AND ANAEROBIC 3CC  Final   Culture NO GROWTH 3 DAYS  Final   Report Status PENDING  Incomplete     Studies: No results found.  Scheduled Meds: . amLODipine  5 mg Oral Daily  . antiseptic oral rinse  7 mL Mouth Rinse BID  . gabapentin  100 mg Oral TID  . mirtazapine  15 mg Oral QHS  . nicotine  21 mg Transdermal Daily  . oxyCODONE  5 mg Oral BID    Assessment/Plan:  1. Acute respiratory failure which has resolved. Patient extubated. 2. Altered mental status has improved. Unclear etiology of what transpired. Hx of snorting heroin which could cause altered mental status and acute respiratory failure. Patient also had benzodiazepine in the urine toxicology which could cause the same thing. 3. Chronic pain on low-dose oxycodone and gabapentin 4. Hypertension- norvasc. Patient stopped her blood pressure medications at home. 5. Depression- Seen by psychiatry and they recommended further inpatient monitoring. Currently no beds available on the psychiatric floor. Hopefully something will open up tomorrow.Start Remeron  and when necessary trazodone  Code Status:     Code Status Orders        Start     Ordered   04/28/16 1807  Full code  Continuous     04/28/16 1812    Code Status History    Date Active Date Inactive Code Status Order ID Comments User Context   04/28/2016  6:12 PM 04/29/2016  2:51 PM Full Code [id number]  Eugenie Norrieana G Blakeney, NP ED     Family Communication: Spoke with the daughter on the phoneYesterday Disposition Plan: Inpatient psychiatric care  Consultants:  Critical care specialist  Psychiatry  Time spent: 24 minutes  Alford HighlandWIETING, Kelin Nixon  [facility]

## 2016-05-01 NOTE — Care Management Note (Signed)
Case Management Note  Patient Details  Name: Erica Stevens MRN: [id number] Date of Birth: [birth year]  Subjective/Objective:        Discussed discharge note from psychiatrist today with charge nurse Erica [facility] who reports that Ms Erica Stevens is awaiting transfer to [facility].             Action/Plan:   Expected Discharge Date:                  Expected Discharge Plan:     In-House Referral:  Clinical Social Work  Discharge planning Services  CM Consult  Post Acute Care Choice:    Choice offered to:  Adult Children  DME Arranged:    DME Agency:     HH Arranged:    HH Agency:     Status of Service:  In process, will continue to follow  If discussed at Long Length of Stay Meetings, dates discussed:    Additional Comments:  Erica Scotti A, RN 05/01/2016, 2:16 PM

## 2016-05-01 NOTE — Progress Notes (Signed)
Referral information for Geriatric Placement have been faxed to;     [facility] ([phone]),    [facility] ([phone]),    [facility] ([phone])   [facility] ([phone]),    [facility] ([phone] or [phone]),    [facility] ([phone]),    [facility] ([phone]).   05/01/2016 Nicole Daryus Sowash, MS, NCC, LPCA Therapeutic Triage Specialist  

## 2016-05-01 NOTE — Evaluation (Signed)
Physical Therapy Evaluation Patient Details Name: Erica HalimSusan Stevens MRN: [id number] DOB: Apr 11, 1951 Today's Date: 05/01/2016   History of Present Illness  65 yo Female came to ED unresponsive. She was admitted with acute respiratory failure and AMS. It is unclear the cause of the respiratory failure. Patient has a history of substance abuse.   Clinical Impression  65 yo Female came to ED unresponsive. Patient was brought in by a friend who states that she went to drink something and then started coughing and became unresponsive. Patient was intubated in ED and admitted with ARF and AMS. Patient reports being mod I for self care prior to admittance and would use a cane for some mobility. She reports that she was not driving. Currently she is mod I for bed mobility and transfers. Patient does require RW with gait due to increased foot discomfort while walking on hard surface without shoes. Patient required CGA for gait safety due to impaired balance. She would benefit from additional skilled PT intervention to improve balance/gait safety and reduce fall risk.     Follow Up Recommendations Home health PT    Equipment Recommendations   (to be determined by Home Health PT)    Recommendations for Other Services Rehab consult     Precautions / Restrictions Precautions Precautions: Fall Restrictions Weight Bearing Restrictions: No      Mobility  Bed Mobility   Bed Mobility: Supine to Sit     Supine to sit: Modified independent (Device/Increase time)     General bed mobility comments: utilized bed rail and elevated head of bed;   Transfers Overall transfer level: Modified independent Equipment used: None             General transfer comment: utilized bed rail to push up into standing;   Ambulation/Gait Ambulation/Gait assistance: Min guard Ambulation Distance (Feet): 160 Feet Assistive device: Rolling walker (2 wheeled) Gait Pattern/deviations: Step-through pattern;Decreased  step length - right;Decreased step length - left;Narrow base of support;Decreased dorsiflexion - right;Decreased dorsiflexion - left     General Gait Details: Patient reports increased foot discomfort walking without shoes; she does have non-skid socks; She demonstrates slower gait speed, decreased step length;   Stairs            Wheelchair Mobility    Modified Rankin (Stroke Patients Only)       Balance Overall balance assessment: Needs assistance Sitting-balance support: No upper extremity supported;Feet supported Sitting balance-Leahy Scale: Good Sitting balance - Comments: demonstrates good trunk control;    Standing balance support: Single extremity supported Standing balance-Leahy Scale: Good Standing balance comment: able to keep balance with 1 HHA or less statically with feet together; however dynamic standing balance is fair;                              Pertinent Vitals/Pain Pain Assessment: 0-10 Pain Score: 8  Pain Location: upper back pain; chronic Pain Descriptors / Indicators: Aching;Sore Pain Intervention(s): Limited activity within patient's tolerance;Monitored during session;Repositioned    Home Living Family/patient expects to be discharged to:: Private residence Living Arrangements: Children (lives with son) Available Help at Discharge: Family Type of Home: House Home Access: Stairs to enter Entrance Stairs-Rails: Doctor, general practiceight;Left Entrance Stairs-Number of Steps: 2 flights (left rail with first flight, right rail with 2nd flight) Home Layout: One level Home Equipment: Cane - single point;Wheelchair - manual      Prior Function Level of Independence: Independent with assistive device(s)  Comments: used cane from time to time; reports being mod I for self care ADLs; she was not driving prior to admittance;      Hand Dominance        Extremity/Trunk Assessment   Upper Extremity Assessment: Overall WFL for tasks assessed            Lower Extremity Assessment: Generalized weakness (grossly 4-/5)      Cervical / Trunk Assessment: Kyphotic  Communication   Communication: No difficulties  Cognition Arousal/Alertness: Awake/alert Behavior During Therapy: WFL for tasks assessed/performed Overall Cognitive Status: Within Functional Limits for tasks assessed                      General Comments      Exercises        Assessment/Plan    PT Assessment Patient needs continued PT services  PT Diagnosis Difficulty walking;Generalized weakness   PT Problem List Decreased strength;Decreased activity tolerance;Decreased balance;Decreased mobility;Decreased safety awareness;Pain  PT Treatment Interventions DME instruction;Gait training;Stair training;Functional mobility training;Neuromuscular re-education;Balance training;Therapeutic exercise;Therapeutic activities;Patient/family education   PT Goals (Current goals can be found in the Care Plan section) Acute Rehab PT Goals Patient Stated Goal: "I want to go home" PT Goal Formulation: With patient Time For Goal Achievement: 05/15/16 Potential to Achieve Goals: Good    Frequency Min 2X/week   Barriers to discharge Inaccessible home environment has 2 flights of stairs to negotiate; however son is at home part of the day;     Co-evaluation               End of Session Equipment Utilized During Treatment: Gait belt Activity Tolerance: Patient tolerated treatment well Patient left: in chair;with call bell/phone within reach;with chair alarm set Nurse Communication: Mobility status         Time: [id number] PT Time Calculation (min) (ACUTE ONLY): 27 min   Charges:   PT Evaluation $PT Eval Low Complexity: 1 Procedure     PT G Codes:        Dimitria Ketchum PT, DPT 05/01/2016, 12:00 PM
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
## 2016-05-01 NOTE — Progress Notes (Signed)
Paged MD, patient requesting something for anxiety, per MD, new order for Benadryl. Patient stated she is about to "come out of her skin."

## 2016-05-02 LAB — MRSA CULTURE

## 2016-05-02 MED ORDER — GABAPENTIN 100 MG PO CAPS
100.0000 mg | ORAL_CAPSULE | Freq: Three times a day (TID) | ORAL | 0 refills | Status: DC
Start: 2016-05-02 — End: 2017-01-13

## 2016-05-02 MED ORDER — AMLODIPINE BESYLATE 5 MG PO TABS: 5.0000 mg | ORAL_TABLET | Freq: Every day | ORAL | 0 refills | Status: DC

## 2016-05-02 MED ORDER — ALBUTEROL SULFATE HFA 108 (90 BASE) MCG/ACT IN AERS: 2.0000 | INHALATION_SPRAY | Freq: Four times a day (QID) | RESPIRATORY_TRACT | 2 refills | Status: DC | PRN

## 2016-05-02 MED ORDER — OXYCODONE HCL 5 MG PO TABS: 5.0000 mg | ORAL_TABLET | Freq: Two times a day (BID) | ORAL | 0 refills | Status: DC | PRN

## 2016-05-02 MED ORDER — MIRTAZAPINE 15 MG PO TABS: 15.0000 mg | ORAL_TABLET | Freq: Every day | ORAL | 0 refills | Status: DC

## 2016-05-02 MED ORDER — TRAZODONE HCL 50 MG PO TABS: 50.0000 mg | ORAL_TABLET | Freq: Every evening | ORAL | 0 refills | Status: DC | PRN

## 2016-05-02 MED ORDER — NICOTINE 21 MG/24HR TD PT24: 21.0000 mg | MEDICATED_PATCH | Freq: Every day | TRANSDERMAL | 0 refills | Status: DC

## 2016-05-02 NOTE — Progress Notes (Signed)
[facility] PROGRESS NOTE  Erica HalimSusan Armand WUJ:[id number] DOB: 04-22-1951 DOA: 04/28/2016 PCP: Dennison MascotLemont Morrisey, MD  HPI/Subjective: Patient feels better today. She states she slept better.  Objective: Vitals:   05/02/16 0734 05/02/16 1152  BP: (!) 159/62   Pulse: 64 75  Resp: 16   Temp: 98.1 F (36.7 C)     Filed Weights   04/28/16 2100 04/29/16 0219 04/30/16 0600  Weight: 59.4 kg (130 lb 15.3 oz) 59.5 kg (131 lb 2.8 oz) 61.8 kg (136 lb 3.9 oz)    ROS: Review of Systems  Constitutional: Negative for chills and fever.  Eyes: Negative for blurred vision.  Respiratory: Negative for cough and shortness of breath.   Cardiovascular: Negative for chest pain.  Gastrointestinal: Negative for abdominal pain, constipation, diarrhea, nausea and vomiting.  Genitourinary: Negative for dysuria.  Musculoskeletal: Positive for joint pain.  Neurological: Negative for dizziness and headaches.  Psychiatric/Behavioral: Positive for depression and substance abuse.   Exam: Physical Exam  Constitutional: She is oriented to person, place, and time.  Neck: No tracheal tenderness present. Carotid bruit is not present. No edema present.  Cardiovascular:  Murmur heard.  Systolic murmur is present with a grade of 2/6  Respiratory: She has no wheezes. She has no rhonchi. She has no rales.  GI: Soft. There is no tenderness.  Musculoskeletal:       Right ankle: She exhibits no swelling.       Left ankle: She exhibits no swelling.  Lymphadenopathy:    She has no cervical adenopathy.  Neurological: She is alert and oriented to person, place, and time. No cranial nerve deficit.  Skin: Skin is dry. No rash noted.  Psychiatric: She exhibits a depressed mood.      Data Reviewed: Basic Metabolic Panel:  Recent Labs Lab 04/28/16 1621 04/28/16 1951 04/29/16 0404 04/29/16 1659 04/30/16 0429 05/01/16 0411  NA 136  --  135  --  141  --   K 3.3*  --  3.8  --  3.9  --   CL 101  --  108  --   107  --   CO2 23  --  24  --  28  --   GLUCOSE 292*  --  203*  --  89  --   BUN 11  --  14  --  9  --   CREATININE 1.00  --  0.86  --  0.74  --   CALCIUM 8.9  --  8.1*  --  8.5*  --   MG 2.0 1.8  --  1.6* 1.6* 2.0  PHOS  --  3.6  --  3.1 3.1  --    Liver Function Tests:  Recent Labs Lab 04/28/16 1621  AST 60*  ALT 21  ALKPHOS 83  BILITOT 0.8  PROT 8.1  ALBUMIN 3.9    Recent Labs Lab 04/28/16 1621  LIPASE 21   CBC:  Recent Labs Lab 04/28/16 1621 04/29/16 0404  WBC 20.1* 11.4*  NEUTROABS 6.0  --   HGB 13.6 11.4*  HCT 39.9 33.4*  MCV 97.2 94.7  PLT 316 186   Cardiac Enzymes:  Recent Labs Lab 04/28/16 1621  TROPONINI <0.03    CBG:  Recent Labs Lab 04/30/16 0322 04/30/16 0738 04/30/16 1143 04/30/16 1559 04/30/16 1955  GLUCAP 90 87 165* 120* 128*    Recent Results (from the past 240 hour(s))  Urine culture     Status: None   Collection Time: 04/28/16  6:30 PM  Result Value Ref Range Status   Specimen Description URINE, RANDOM  Final   Special Requests NONE  Final   [facility]    Final   Report Status 04/30/2016 FINAL  Final  MRSA culture     Status: Abnormal   Collection Time: 04/28/16  7:25 PM  Result Value Ref Range Status   Specimen Description NOSE  Final   Special Requests NONE  Final   Culture METHICILLIN RESISTANT STAPHYLOCOCCUS AUREUS (A)  Final   Report Status 05/02/2016 FINAL  Final  CULTURE, BLOOD (ROUTINE X 2) w Reflex to ID Panel     Status: None (Preliminary result)   Collection Time: 04/28/16  7:51 PM  Result Value Ref Range Status   Specimen Description BLOOD LEFT WRIST  Final   Special Requests BOTTLES DRAWN AEROBIC AND ANAEROBIC 3CC  Final   Culture NO GROWTH 4 DAYS  Final   Report Status PENDING  Incomplete  CULTURE, BLOOD (ROUTINE X 2) w Reflex to ID Panel     Status: None (Preliminary result)   Collection Time: 04/28/16  7:51 PM  Result Value Ref Range Status   Specimen  Description BLOOD LEFT HAND  Final   Special Requests BOTTLES DRAWN AEROBIC AND ANAEROBIC 3CC  Final   Culture NO GROWTH 4 DAYS  Final   Report Status PENDING  Incomplete      Scheduled Meds: . amLODipine  5 mg Oral Daily  . antiseptic oral rinse  7 mL Mouth Rinse BID  . gabapentin  100 mg Oral TID  . mirtazapine  15 mg Oral QHS  . nicotine  21 mg Transdermal Daily  . oxyCODONE  5 mg Oral BID    Assessment/Plan:  1. Acute respiratory failure which has resolved. Patient extubated. 2. Altered mental status has improved. 3. Chronic pain on low-dose oxycodone and gabapentin 4. Hypertension- norvasc.  5. Depression- Psychiatry reevaluation today to determine whether she needs further inpatient treatment or whether she can go home. 6. Physical therapy recommended home with home health.  Code Status:     Code Status Orders        Start     Ordered   04/28/16 1807  Full code  Continuous     04/28/16 1812    Code Status History    Date Active Date Inactive Code Status Order ID Comments User Context   04/28/2016  6:12 PM 04/29/2016  2:51 PM Full Code [id number]  Eugenie Norrieana G Blakeney, NP ED     Disposition Plan: Inpatient psychiatric careVersus home with home health depending on psychiatric evaluation.  Consultants:  Critical care specialist  Psychiatry  Time spent: 22 minutes  Alford HighlandWIETING, Amberlee Garvey  [employer] [facility]

## 2016-05-02 NOTE — Care Management Note (Signed)
Case Management Note  Patient Details  Name: Erica HalimSusan Stevens MRN: [id number] Date of Birth: 05/26/1951  Subjective/Objective:     Spoke with patient who is pleasant and appropriate. She stated that she lives with her son and depends on her daughter for transportation; Denies to me issues with filling her prescriptions. Ambulates with a cane but has a walker as well. Has had [facility] prior and would like to have HH with [facility]. PT has recommended physical therapy. PCP is Dr Thana AtesMorrisey.  Awaiting psych eval this afternoon. May need placement.               Action/Plan: Anticpated discharge is Home with home health.  Expected Discharge Date:                  Expected Discharge Plan:     In-House Referral:  Clinical Social Work  Discharge planning Services  CM Consult  Post Acute Care Choice:    Choice offered to:  Adult Children  DME Arranged:    DME Agency:     HH Arranged:    HH Agency:     Status of Service:  In process, will continue to follow  If discussed at Long Length of Stay Meetings, dates discussed:    Additional Comments:  Adonis HugueninBerkhead, Deniece Rankin L, RN 05/02/2016, 4:36 PM

## 2016-05-02 NOTE — Progress Notes (Signed)
Clinical Child psychotherapistocial Worker (CSW) discussed case with Psychiatrist Dr. Toni Amendlapacs who reported that he would be coming in towards the end of the day today and will assess patient for inpatient psych criteria. Charge RN aware of above. CSW will continue to follow and assist as needed.   Baker Hughes IncorporatedBailey Walta [place], LCSW [phone]

## 2016-05-02 NOTE — Progress Notes (Signed)
Physical Therapy Treatment Patient Details Name: Aletha HalimSusan Harari MRN: [id number] DOB: 13-Sep-1950 Today's Date: 05/02/2016    History of Present Illness 65 yo Female came to ED unresponsive. She was admitted with acute respiratory failure and AMS. It is unclear the cause of the respiratory failure. Patient has a history of substance abuse.     PT Comments    Pt agreeable to PT; no voiced complaints. Pt progressing ambulation quality and distance today and performs stair training consecutively with ambulation with only standing rest break. Pt demonstrates good safety and no loss of balance throughout session. Pt received up in chair comfortably. Continue PT to progress strength, endurance to improve overall functional mobility for optimal, safe return home.   Follow Up Recommendations  Home health PT     Equipment Recommendations       Recommendations for Other Services       Precautions / Restrictions Restrictions Weight Bearing Restrictions: No    Mobility  Bed Mobility Overal bed mobility: Independent                Transfers Overall transfer level: Modified independent Equipment used: Rolling walker (2 wheeled)                Ambulation/Gait Ambulation/Gait assistance: Supervision Ambulation Distance (Feet): 270 Feet Assistive device: Rolling walker (2 wheeled);None Gait Pattern/deviations: Step-through pattern Gait velocity: reduced Gait velocity interpretation: Below normal speed for age/gender General Gait Details: Initially with rw; post 60 feet continued without AD with superivision. 1 stand rest break before stair training. No LOB. Pt feels about at her baseline   Stairs Stairs: Yes Stairs assistance: Min guard Stair Management: One rail Right;Step to pattern Number of Stairs: 12 General stair comments: Good safety. Pt performs slow and careful  Wheelchair Mobility    Modified Rankin (Stroke Patients Only)       Balance                                    Cognition Arousal/Alertness: Awake/alert Behavior During Therapy: WFL for tasks assessed/performed Overall Cognitive Status: Within Functional Limits for tasks assessed                      Exercises      General Comments        Pertinent Vitals/Pain Pain Assessment: No/denies pain    Home Living                      Prior Function            PT Goals (current goals can now be found in the care plan section) Progress towards PT goals: Progressing toward goals    Frequency  Min 2X/week    PT Plan Current plan remains appropriate    Co-evaluation             End of Session Equipment Utilized During Treatment: Gait belt Activity Tolerance: Patient tolerated treatment well Patient left: in chair;with call bell/phone within reach;with chair alarm set     Time: [id number] PT Time Calculation (min) (ACUTE ONLY): 24 min  Charges:  $Gait Training: 23-37 mins                    G CodesKristeen Miss:      Elohim Brune Elizabeth Bishop, PTA 05/02/2016, 2:18 PM

## 2016-05-03 ENCOUNTER — Encounter: Payer: Self-pay | Admitting: General Practice

## 2016-05-03 DIAGNOSIS — F0391 Unspecified dementia with behavioral disturbance: Secondary | ICD-10-CM

## 2016-05-03 DIAGNOSIS — R0902 Hypoxemia: Secondary | ICD-10-CM

## 2016-05-03 DIAGNOSIS — F03B18 Unspecified dementia, moderate, with other behavioral disturbance: Secondary | ICD-10-CM

## 2016-05-03 DIAGNOSIS — T401X1D Poisoning by heroin, accidental (unintentional), subsequent encounter: Secondary | ICD-10-CM

## 2016-05-03 DIAGNOSIS — T401X1A Poisoning by heroin, accidental (unintentional), initial encounter: Secondary | ICD-10-CM

## 2016-05-03 DIAGNOSIS — F1110 Opioid abuse, uncomplicated: Secondary | ICD-10-CM

## 2016-05-03 LAB — CULTURE, BLOOD (ROUTINE X 2)
Culture: NO GROWTH
Culture: NO GROWTH

## 2016-05-03 NOTE — Progress Notes (Signed)
Nutrition Follow-up  DOCUMENTATION CODES:   Not applicable  INTERVENTION:  -RD to continue to monitor  NUTRITION DIAGNOSIS:   Inadequate oral intake related to acute illness as evidenced by NPO status. -resolving  GOAL:   Patient will meet greater than or equal to 90% of their needs -progressing  MONITOR:   Vent status, Labs, Weight trends  ASSESSMENT:  Pt extubated, on HH diet now - 75-100% PO documented from 8/21. Today, she had french toast, and sausage she consumed 100% of. No nutrition concerns at this time. Wt stable.  Labs and medications reviewed: Senokot PRN  Diet Order:  Diet Heart Room service appropriate? Yes; Fluid consistency: Thin  Skin:  Reviewed, no issues  Last BM:  no documented BM  Height:   Ht Readings from Last 1 Encounters:  04/28/16 5\' 3"  (1.6 m)    Weight:   Wt Readings from Last 1 Encounters:  05/03/16 130 lb (59 kg)    Ideal Body Weight:     BMI:  Body mass index is 23.03 kg/m.  Estimated Nutritional Needs:   Kcal:  evaluate on follow when more accurate info obtained  Protein:  90-120 g   Fluid:  >/= 1.8 L  EDUCATION NEEDS:   No education needs identified at this time  Dionne AnoWilliam M. Paisyn Guercio, MS, RD LDN Inpatient Clinical Dietitian Pager [phone]

## 2016-05-03 NOTE — Progress Notes (Signed)
Pt being discharged home this afternoon. Discharge insrutctions reviewed with pt, educated on all new prescriptions and follow up appointment. She verbalized understanding with no further questions. She is leaving with her family.

## 2016-05-03 NOTE — Consult Note (Signed)
[facility]   Reason for Consult:  Consult follow-up 65 year old woman brought into the hospital unconscious. Concern about suicidality Referring Physician:  Hilton SinclairWeiting Patient Identification: Aletha HalimSusan Elza MRN:  [id number] Principal Diagnosis: Severe recurrent major depression [facility](HCC) Diagnosis:   Patient Active Problem List   Diagnosis Date Noted  . 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 Anoxia [R09.02] 05/03/2016  . 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 Accidental heroin overdose [T40.1X1A] 05/03/2016  . Severe recurrent major depression (HCC) [F33.2] 05/01/2016  . Acute respiratory failure (HCC) [J96.00] 04/28/2016    Total Time spent with patient: 45 minutes  Subjective:   Aletha HalimSusan Willingham is a 65 y.o. female patient admitted with "I don't know".  HPI:  Follow-up consult for this 65 year old woman. She was brought into the hospital initially as an unknown person unconscious. She was intubated but now has been extubated and is medically stable. Psychiatry saw her over the weekend. It transpires that the patient has been abusing heroin on top of her prescribed narcotics. She admits that she's been snorting heroin a couple times a week recently. This is the most likely an obvious cause for her presentation. On interview today patient describes her mood as being good. Denies any feeling of depression. She says before she came in to the hospital she had been feeling depressed for a couple weeks. She attributes this to her son being away on work so that she was lonely much of the time. Doesn't sleep or eat well. Patient admits that she had had some suicidal thoughts but had no plan or intent and says that she had not been trying to overdose with medication. She says now that her mood is much better and she has no suicidal thoughts at all. Denies any hallucinations. Patient is clearly having significant memory problems.  Social history: Lives with her son. Reported to be  in somewhat difficult circumstances financially. As a daughter who also has been at least partially involved but does not live with her.  Medical history: Patient has chronic headaches and takes daily oxycodone for them. I think it seems clear that she is suffered some anoxic brain damage.  Substance abuse history: Chronic recurrent abuse of opiates. She does not have much insight into it. Never really tried to quit. Doesn't have a clear impression that it is a problem. Never been in any kind of substance abuse treatment. Denies drinking alcohol.  Past Psychiatric History: Patient has been treated by her primary care doctor with Effexor for depression. Denies any past suicide attempts. Denies any prior hospitalizations. No other psychiatric diagnosis known.  Risk to Self: Is patient at risk for suicide?: No Risk to Others:   Prior Inpatient Therapy:   Prior Outpatient Therapy:    Past Medical History:  Past Medical History:  Diagnosis Date  . Anxiety   . Depression   . Hypertension     Past Surgical History:  Procedure Laterality Date  . BYPASS GRAFT     Family History: History reviewed. No pertinent family history. Family Psychiatric  History: Positive for substance abuse Social History:  History  Alcohol Use No     History  Drug Use No    Social History   Social History  . Marital status: Single    Spouse name: N/A  . Number of children: N/A  . Years of education: N/A   Social History Main Topics  . Smoking status: Current Every Day Smoker    Packs/day: 1.00    Types: Cigarettes  .  Smokeless tobacco: Never Used  . Alcohol use No  . Drug use: No  . Sexual activity: Not Asked   Other Topics Concern  . None   Social History Narrative  . None   Additional Social History:    Allergies:   Allergies  Allergen Reactions  . Sulfa Antibiotics     Stated turns orange    Labs: No results found for this or any previous visit (from the past 48  hour(s)).  Current Facility-Administered Medications  Medication Dose Route Frequency Provider Last Rate Last Dose  . 0.9 %  sodium chloride infusion  250 mL Intravenous PRN Eugenie Norrie, NP 10 mL/hr at 04/30/16 0800 250 mL at 04/30/16 0800  . amLODipine (NORVASC) tablet 5 mg  5 mg Oral Daily Alford Highland, MD   5 mg at 05/03/16 0919  . antiseptic oral rinse (CPC / CETYLPYRIDINIUM CHLORIDE 0.05%) solution 7 mL  7 mL Mouth Rinse BID Vishal Mungal, MD   7 mL at 05/03/16 0920  . diphenhydrAMINE (BENADRYL) capsule 25 mg  25 mg Oral Q6H PRN Alford Highland, MD   25 mg at 05/01/16 1807  . gabapentin (NEURONTIN) capsule 100 mg  100 mg Oral TID Stephanie Acre, MD   100 mg at 05/03/16 0919  . ipratropium-albuterol (DUONEB) 0.5-2.5 (3) MG/3ML nebulizer solution 3 mL  3 mL Nebulization Q6H PRN Eugenie Norrie, NP      . mirtazapine (REMERON) tablet 15 mg  15 mg Oral QHS Alford Highland, MD   15 mg at 05/02/16 2108  . nicotine (NICODERM CQ - dosed in mg/24 hours) patch 21 mg  21 mg Transdermal Daily Marguarite Arbour, MD   21 mg at 05/03/16 0920  . oxyCODONE (Oxy IR/ROXICODONE) immediate release tablet 5 mg  5 mg Oral BID Vishal Mungal, MD   5 mg at 05/03/16 0920  . sennosides (SENOKOT) 8.8 MG/5ML syrup 5 mL  5 mL Per Tube BID PRN Eugenie Norrie, NP      . traZODone (DESYREL) tablet 50 mg  50 mg Oral QHS PRN Alford Highland, MD        Musculoskeletal: Strength & Muscle Tone: within normal limits Gait & Station: unsteady Patient leans: N/A  Psychiatric Specialty Exam: Physical Exam  Nursing note and vitals reviewed. Constitutional: She appears well-developed and well-nourished.  HENT:  Head: Normocephalic and atraumatic.  Eyes: Conjunctivae are normal. Pupils are equal, round, and reactive to light.  Neck: Normal range of motion.  Cardiovascular: Regular rhythm and normal heart sounds.   Respiratory: Effort normal. No respiratory distress.  GI: Soft.  Musculoskeletal: Normal range of motion.   Neurological: She is alert.  Skin: Skin is warm and dry.  Psychiatric: Her affect is inappropriate. Her speech is tangential. She is slowed. She expresses inappropriate judgment. She expresses no suicidal ideation. She exhibits abnormal recent memory and abnormal remote memory.    Review of Systems  Constitutional: Negative.   HENT: Negative.   Eyes: Negative.   Respiratory: Negative.   Cardiovascular: Negative.   Gastrointestinal: Negative.   Musculoskeletal: Negative.   Skin: Negative.   Neurological: Negative.   Psychiatric/Behavioral: Positive for memory loss and substance abuse. Negative for depression, hallucinations and suicidal ideas. The patient has insomnia. The patient is not nervous/anxious.     Blood pressure (!) 158/55, pulse 64, temperature 98.2 F (36.8 C), temperature source Oral, resp. rate 16, height 5\' 3"  (1.6 m), weight 59 kg (130 lb), SpO2 99 %.Body mass index is 23.03 kg/m.  General Appearance: Casual  Eye Contact:  Good  Speech:  Slow  Volume:  Decreased  Mood:  Euthymic  Affect:  Constricted and Inappropriate  Thought Process:  Goal Directed  Orientation:  Negative  Thought Content:  Tangential  Suicidal Thoughts:  No  Homicidal Thoughts:  No  Memory:  Immediate;   Fair Recent;   Poor Remote;   Poor  Judgement:  Impaired  Insight:  Shallow  Psychomotor Activity:  Decreased  Concentration:  Concentration: Poor  Recall:  Poor  Fund of Knowledge:  Poor  Language:  Fair  Akathisia:  No  Handed:  Right  AIMS (if indicated):     Assets:  Communication Skills Housing  ADL's:  Intact  Cognition:  Impaired,  Mild and Moderate  Sleep:        Treatment Plan Summary: Plan 65 year old woman who came into the hospital after overdosing accidentally on narcotics. Probably some combination of heroin and fentanyl. Patient was described as not breathing when she came in and required immediate ventilation. She is clearly demented to my interview today.  Couldn't remember any words at just a couple minutes, has no idea what year it is, constantly forgot basic information about her life. I suspect she has had some brain injury from this episode. I explained that to her and also explained how another episode like this could easily kill her or make her a vegetable. Patient however does not require inpatient psychiatric treatment. Does not appear to be suicidal. Does not even appear to be depressed. Strongly recommend that she not be prescribed narcotics in the future although I also am concerned that she is at high risk for continuing to abuse street narcotics. I tried to give her information about the availability of rescue kids at pharmacies and strongly encouraged her to obtain one or more of them  Disposition: Patient does not meet criteria for psychiatric inpatient admission. Supportive therapy provided about ongoing stressors.  Mordecai RasmussenJohn Loreto Loescher, MD 05/03/2016 4:04 PM

## 2016-05-03 NOTE — Progress Notes (Signed)
Pt is still awaiting decision from Psych regarding discharge disposition. MD has made contact with Psych who stated he would be by to see pt at some point today.

## 2016-05-03 NOTE — Discharge Summary (Signed)
[facility] - [place] [facility]   PATIENT NAME: Erica HalimSusan Stevens    MR#:  [id number]  DATE OF BIRTH:  [birth year]  DATE OF ADMISSION:  04/28/2016 ADMITTING PHYSICIAN: Erin FullingKurian Kasa, MD  DATE OF DISCHARGE: 05/03/2016  PRIMARY CARE PHYSICIAN: Dennison MascotLemont Morrisey, MD    ADMISSION DIAGNOSIS:  Acute respiratory failure (HCC) [J96.00] Altered mental status [R41.82] Unresponsive [R40.4] Acute respiratory failure with hypoxia (HCC) [J96.01]  DISCHARGE DIAGNOSIS:  Principal Problem:   Severe recurrent major depression (HCC) Active Problems:   Acute respiratory failure (HCC)   Opiate abuse, episodic   Anoxia   Organic dementia   Accidental heroin overdose   SECONDARY DIAGNOSIS:   Past Medical History:  Diagnosis Date  . Anxiety   . Depression   . Hypertension     HOSPITAL COURSE:   1. Acute respiratory failure which has resolved. Patient was on the intensivist service after the patient was intubated. Patient was soon extubated and transferred to the medicine service. Patient breathing comfortably on room air. 2. Acute encephalopathy this has also improved. Could've been secondary to snorting heroin. 3. Chronic pain on low-dose oxycodone and gabapentin. Small prescription of oxycodone prescribed until she can get to her PMD. 4. Hypertension. On Norvasc. Patient's blood pressure variable based on mood. 5. Depression. Initial psychiatric evaluation on Sunday stated she needed to be inpatient psychiatric monitoring. Patient doing better on Remeron and when necessary trazodone. Psych reevaluation today to see whether the patient didn't go home or not. 6. Physical therapy recommended home with home health. Patient has a walker at home. 7. Hypomagnesemia this was also replaced during the hospital stay.  DISCHARGE CONDITIONS:   Satisfactory  CONSULTS OBTAINED:  Treatment Team:  Beverly SessionsJagannath Subedi, MD Audery AmelJohn T Clapacs, MD  DRUG ALLERGIES:   Allergies  Allergen  Reactions  . Sulfa Antibiotics     Stated turns orange    DISCHARGE MEDICATIONS:   Current Discharge Medication List    START taking these medications   Details  albuterol (PROVENTIL HFA;VENTOLIN HFA) 108 (90 Base) MCG/ACT inhaler Inhale 2 puffs into the lungs every 6 (six) hours as needed for wheezing or shortness of breath. Qty: 1 Inhaler, Refills: 2    amLODipine (NORVASC) 5 MG tablet Take 1 tablet (5 mg total) by mouth daily. Qty: 30 tablet, Refills: 0    mirtazapine (REMERON) 15 MG tablet Take 1 tablet (15 mg total) by mouth at bedtime. Qty: 30 tablet, Refills: 0    nicotine (NICODERM CQ - DOSED IN MG/24 HOURS) 21 mg/24hr patch Place 1 patch (21 mg total) onto the skin daily. Qty: 28 patch, Refills: 0    traZODone (DESYREL) 50 MG tablet Take 1 tablet (50 mg total) by mouth at bedtime as needed for sleep. Qty: 30 tablet, Refills: 0      CONTINUE these medications which have CHANGED   Details  gabapentin (NEURONTIN) 100 MG capsule Take 1 capsule (100 mg total) by mouth 3 (three) times daily. Qty: 90 capsule, Refills: 0    oxyCODONE (OXY IR/ROXICODONE) 5 MG immediate release tablet Take 1 tablet (5 mg total) by mouth 2 (two) times daily as needed for severe pain. Qty: 30 tablet, Refills: 0      STOP taking these medications     venlafaxine XR (EFFEXOR-XR) 150 MG 24 hr capsule          DISCHARGE INSTRUCTIONS:   Follow-up PMD one week  If you experience worsening of your admission symptoms, develop shortness of breath, life threatening  emergency, suicidal or homicidal thoughts you must seek medical attention immediately by calling 911 or calling your MD immediately  if symptoms less severe.  You Must read complete instructions/literature along with all the possible adverse reactions/side effects for all the Medicines you take and that have been prescribed to you. Take any new Medicines after you have completely understood and accept all the possible adverse  reactions/side effects.   Please note  You were cared for by a hospitalist during your hospital stay. If you have any questions about your discharge medications or the care you received while you were in the hospital after you are discharged, you can call the unit and asked to speak with the hospitalist on call if the hospitalist that took care of you is not available. Once you are discharged, your primary care physician will handle any further medical issues. Please note that NO REFILLS for any discharge medications will be authorized once you are discharged, as it is imperative that you return to your primary care physician (or establish a relationship with a primary care physician if you do not have one) for your aftercare needs so that they can reassess your need for medications and monitor your lab values.    Today   CHIEF COMPLAINT:   Chief Complaint  Patient presents with  . Loss of Consciousness  . Respiratory Distress    HISTORY OF PRESENT ILLNESS:  Erica Stevens  is a 65 y.o. female was admitted as a Erskine SquibbJane Doe because of respiratory distress and altered mental status. Patient was intubated and on the critical care service.   VITAL SIGNS:  Blood pressure (!) 158/55, pulse 64, temperature 98.2 F (36.8 C), temperature source Oral, resp. rate 16, height 5\' 3"  (1.6 m), weight 59 kg (130 lb), SpO2 99 %.    PHYSICAL EXAMINATION:  GENERAL:  65 y.o.-year-old patient lying in the bed with no acute distress.  EYES: Pupils equal, round, reactive to light and accommodation. No scleral icterus. Extraocular muscles intact.  HEENT: Head atraumatic, normocephalic. Oropharynx and nasopharynx clear.  NECK:  Supple, no jugular venous distention. No thyroid enlargement, no tenderness.  LUNGS: Normal breath sounds bilaterally, no wheezing, rales,rhonchi or crepitation. No use of accessory muscles of respiration.  CARDIOVASCULAR: S1, S2 normal. No murmurs, rubs, or gallops.  ABDOMEN: Soft,  non-tender, non-distended. Bowel sounds present. No organomegaly or mass.  EXTREMITIES: No pedal edema, cyanosis, or clubbing.  NEUROLOGIC: Cranial nerves II through XII are intact. Muscle strength 5/5 in all extremities. Sensation intact. Gait not checked.  PSYCHIATRIC: The patient is alert and oriented x 3.  SKIN: No obvious rash, lesion, or ulcer.   DATA REVIEW:   CBC  Recent Labs Lab 04/29/16 0404  WBC 11.4*  HGB 11.4*  HCT 33.4*  PLT 186    Chemistries   Recent Labs Lab 04/28/16 1621  04/30/16 0429 05/01/16 0411  NA 136  < > 141  --   K 3.3*  < > 3.9  --   CL 101  < > 107  --   CO2 23  < > 28  --   GLUCOSE 292*  < > 89  --   BUN 11  < > 9  --   CREATININE 1.00  < > 0.74  --   CALCIUM 8.9  < > 8.5*  --   MG 2.0  < > 1.6* 2.0  AST 60*  --   --   --   ALT 21  --   --   --  ALKPHOS 83  --   --   --   BILITOT 0.8  --   --   --   < > = values in this interval not displayed.  Cardiac Enzymes  Recent Labs Lab 04/28/16 1621  TROPONINI <0.03    Microbiology Results  Results for orders placed or performed during the hospital encounter of 04/28/16  Urine culture     Status: None   Collection Time: 04/28/16  6:30 PM  Result Value Ref Range Status   Specimen Description URINE, RANDOM  Final   Special Requests NONE  Final   Culture NO GROWTH Performed at [facility]   Final   Report Status 04/30/2016 FINAL  Final  MRSA culture     Status: Abnormal   Collection Time: 04/28/16  7:25 PM  Result Value Ref Range Status   Specimen Description NOSE  Final   Special Requests NONE  Final   Culture METHICILLIN RESISTANT STAPHYLOCOCCUS AUREUS (A)  Final   Report Status 05/02/2016 FINAL  Final  CULTURE, BLOOD (ROUTINE X 2) w Reflex to ID Panel     Status: None   Collection Time: 04/28/16  7:51 PM  Result Value Ref Range Status   Specimen Description BLOOD LEFT WRIST  Final   Special Requests BOTTLES DRAWN AEROBIC AND ANAEROBIC 3CC  Final   Culture NO GROWTH  5 DAYS  Final   Report Status 05/03/2016 FINAL  Final  CULTURE, BLOOD (ROUTINE X 2) w Reflex to ID Panel     Status: None   Collection Time: 04/28/16  7:51 PM  Result Value Ref Range Status   Specimen Description BLOOD LEFT HAND  Final   Special Requests BOTTLES DRAWN AEROBIC AND ANAEROBIC 3CC  Final   Culture NO GROWTH 5 DAYS  Final   Report Status 05/03/2016 FINAL  Final   Management plans discussed with the patient, And she is in agreement.  CODE STATUS:     Code Status Orders        Start     Ordered   04/28/16 1807  Full code  Continuous     04/28/16 1812    Code Status History    Date Active Date Inactive Code Status Order ID Comments User Context   04/28/2016  6:12 PM 04/29/2016  2:51 PM Full Code [id number]  Eugenie Norrie, NP ED      TOTAL TIME TAKING CARE OF THIS PATIENT: 35 minutes.    Alford Highland M.D on 05/03/2016 at 4:12 PM  Between 7am to 6pm - Pager - [phone]  After 6pm go to [email or web address] - password [email or web address]  [facility] Office  [phone]  CC: Primary care physician; Dennison Mascot, MD

## 2016-05-03 NOTE — Care Management Important Message (Signed)
Important Message  Patient Details  Name: Erica HalimSusan Stevens MRN: [id number] Date of Birth: 27-Aug-1951   Medicare Important Message Given:  Yes    Adonis HugueninBerkhead, Saamiya Jeppsen L, RN 05/03/2016, 9:50 AM

## 2016-05-03 NOTE — Discharge Instructions (Signed)
Respiratory failure is when your lungs are not working well and your breathing (respiratory) system fails. When respiratory failure occurs, it is difficult for your lungs to get enough oxygen, get rid of carbon dioxide, or both. Respiratory failure can be life threatening.  °Respiratory failure can be acute or chronic. Acute respiratory failure is sudden, severe, and requires emergency medical treatment. Chronic respiratory failure is less severe, happens over time, and requires ongoing treatment.  °WHAT ARE THE CAUSES OF ACUTE RESPIRATORY FAILURE?  °Any problem affecting the heart or lungs can cause acute respiratory failure. Some of these causes include the following: °· Chronic bronchitis and emphysema (COPD).   °· Blood clot going to a lung (pulmonary embolism).   °· Having water in the lungs caused by heart failure, lung injury, or infection (pulmonary edema).   °· Collapsed lung (pneumothorax).   °· Pneumonia.   °· Pulmonary fibrosis.   °· Obesity.   °· Asthma.   °· Heart failure.   °· Any type of trauma to the chest that can make breathing difficult.   °· Nerve or muscle diseases making chest movements difficult. °HOW WILL MY ACUTE RESPIRATORY FAILURE BE TREATED?  °Treatment of acute respiratory failure depends on the cause of the respiratory failure. Usually, you will stay in the intensive care unit so your breathing can be watched closely. Treatment can include the following: °· Oxygen. Oxygen can be delivered through the following: °¨ Nasal cannula. This is small tubing that goes in your nose to give you oxygen. °¨ Face mask. A face mask covers your nose and mouth to give you oxygen. °· Medicine. Different medicines can be given to help with breathing. These can include: °¨ Nebulizers. Nebulizers deliver medicines to open the air passages (bronchodilators). These medicines help to open or relax the airways in the lungs so you can breathe better. They can also help loosen mucus from your  lungs. °¨ Diuretics. Diuretic medicines can help you breathe better by getting rid of extra water in your body. °¨ Steroids. Steroid medicines can help decrease swelling (inflammation) in your lungs. °¨ Antibiotics. °· Chest tube. If you have a collapsed lung (pneumothorax), a chest tube is placed to help reinflate the lung. °· Noninvasive positive pressure ventilation (NPPV). This is a tight-fitting mask that goes over your nose and mouth. The mask has tubing that is attached to a machine. The machine blows air into the tubing, which helps to keep the tiny air sacs (alveoli) in your lungs open. This machine allows you to breathe on your own. °· Ventilator. A ventilator is a breathing machine. When on a ventilator, a breathing tube is put into the lungs. A ventilator is used when you can no longer breathe well enough on your own. You may have low oxygen levels or high carbon dioxide (CO2) levels in your blood. When you are on a ventilator, sedation and pain medicines are given to make you sleep so your lungs can heal. °SEEK IMMEDIATE MEDICAL CARE IF: °· You have shortness of breath (dyspnea) with or without activity. °· You have rapid breathing (tachypnea). °· You are wheezing. °· You are unable to say more than a few words without having to catch your breath. °· You find it very difficult to function normally. °· You have a fast heart rate. °· You have a bluish color to your finger or toe nail beds. °· You have confusion or drowsiness or both. °  °This information is not intended to replace advice given to you by your health care provider. Make sure you discuss   any questions you have with your health care provider. °  °Document Released: 09/03/2013 Document Revised: 05/20/2015 Document Reviewed: 09/03/2013 °Elsevier Interactive Patient Education ©2016 [employer] ° °

## 2016-05-04 ENCOUNTER — Encounter: Payer: Self-pay | Admitting: Family Medicine

## 2016-05-04 ENCOUNTER — Telehealth: Payer: Self-pay | Admitting: Emergency Medicine

## 2016-05-04 NOTE — Telephone Encounter (Signed)
She should not fill rx. She was given oxycodone 10 mg #60 not even three weeks ago. She is under a pain contract

## 2016-05-04 NOTE — Telephone Encounter (Signed)
Patient called and stated she was in the hospital for 6 days for depression. Was given Oxycodone 5mg  tablets. Pharmacy will not give to her until you state this is ok

## 2016-05-19 ENCOUNTER — Ambulatory Visit: Payer: Medicaid Other | Admitting: Pain Medicine

## 2016-06-27 ENCOUNTER — Ambulatory Visit: Payer: Commercial Managed Care - HMO | Admitting: Family Medicine

## 2016-07-26 ENCOUNTER — Ambulatory Visit: Payer: Medicaid Other | Admitting: Pain Medicine

## 2016-07-27 ENCOUNTER — Ambulatory Visit: Payer: Medicaid Other | Admitting: Pain Medicine

## 2016-10-17 ENCOUNTER — Telehealth: Payer: Self-pay | Admitting: *Deleted

## 2016-10-18 ENCOUNTER — Ambulatory Visit: Payer: Commercial Managed Care - HMO | Admitting: Pain Medicine

## 2016-11-15 ENCOUNTER — Ambulatory Visit: Payer: Commercial Managed Care - HMO | Admitting: Pain Medicine

## 2017-01-01 ENCOUNTER — Encounter: Payer: Self-pay | Admitting: Emergency Medicine

## 2017-01-01 ENCOUNTER — Emergency Department: Payer: Medicare HMO

## 2017-01-01 ENCOUNTER — Emergency Department
Admission: EM | Admit: 2017-01-01 | Discharge: 2017-01-01 | Disposition: A | Discharge status: Home / Self Care | Payer: Medicare HMO | Attending: Student in an Organized Health Care Education/Training Program | Admitting: Student in an Organized Health Care Education/Training Program

## 2017-01-01 DIAGNOSIS — F17210 Nicotine dependence, cigarettes, uncomplicated: Secondary | ICD-10-CM | POA: Insufficient documentation

## 2017-01-01 DIAGNOSIS — M5090 Cervical disc disorder, unspecified, unspecified cervical region: Secondary | ICD-10-CM

## 2017-01-01 DIAGNOSIS — M5136 Other intervertebral disc degeneration, lumbar region: Secondary | ICD-10-CM | POA: Diagnosis not present

## 2017-01-01 DIAGNOSIS — I130 Hypertensive heart and chronic kidney disease with heart failure and stage 1 through stage 4 chronic kidney disease, or unspecified chronic kidney disease: Secondary | ICD-10-CM | POA: Diagnosis not present

## 2017-01-01 DIAGNOSIS — G8929 Other chronic pain: Secondary | ICD-10-CM

## 2017-01-01 DIAGNOSIS — M25552 Pain in left hip: Secondary | ICD-10-CM | POA: Diagnosis not present

## 2017-01-01 DIAGNOSIS — I5020 Unspecified systolic (congestive) heart failure: Secondary | ICD-10-CM

## 2017-01-01 DIAGNOSIS — N181 Chronic kidney disease, stage 1: Secondary | ICD-10-CM | POA: Insufficient documentation

## 2017-01-01 DIAGNOSIS — I2510 Atherosclerotic heart disease of native coronary artery without angina pectoris: Secondary | ICD-10-CM | POA: Insufficient documentation

## 2017-01-01 DIAGNOSIS — I119 Hypertensive heart disease without heart failure: Secondary | ICD-10-CM

## 2017-01-01 DIAGNOSIS — Z7982 Long term (current) use of aspirin: Secondary | ICD-10-CM | POA: Insufficient documentation

## 2017-01-01 DIAGNOSIS — I509 Heart failure, unspecified: Secondary | ICD-10-CM | POA: Insufficient documentation

## 2017-01-01 DIAGNOSIS — Z79899 Other long term (current) drug therapy: Secondary | ICD-10-CM | POA: Diagnosis not present

## 2017-01-01 LAB — CBC WITH DIFFERENTIAL/PLATELET
Basophils Absolute: 0 10*3/uL (ref 0–0.1)
Basophils Relative: 0 %
EOS ABS: 0.3 10*3/uL (ref 0–0.7)
EOS PCT: 3 %
HCT: 42.2 % (ref 35.0–47.0)
Hemoglobin: 14.5 g/dL (ref 12.0–16.0)
LYMPHS ABS: 2.8 10*3/uL (ref 1.0–3.6)
LYMPHS PCT: 36 %
MCH: 31.7 pg (ref 26.0–34.0)
MCHC: 34.4 g/dL (ref 32.0–36.0)
MCV: 92.1 fL (ref 80.0–100.0)
MONO ABS: 0.4 10*3/uL (ref 0.2–0.9)
Monocytes Relative: 6 %
Neutro Abs: 4.2 10*3/uL (ref 1.4–6.5)
Neutrophils Relative %: 55 %
PLATELETS: 280 10*3/uL (ref 150–440)
RBC: 4.58 MIL/uL (ref 3.80–5.20)
RDW: 14.7 % — AB (ref 11.5–14.5)
WBC: 7.7 10*3/uL (ref 3.6–11.0)

## 2017-01-01 LAB — BASIC METABOLIC PANEL
Anion gap: 6 (ref 5–15)
BUN: 12 mg/dL (ref 6–20)
CHLORIDE: 106 mmol/L (ref 101–111)
CO2: 29 mmol/L (ref 22–32)
CREATININE: 1.3 mg/dL — AB (ref 0.44–1.00)
Calcium: 9.2 mg/dL (ref 8.9–10.3)
GFR calc Af Amer: 48 mL/min — ABNORMAL LOW (ref >60)
GFR, EST NON AFRICAN AMERICAN: 42 mL/min — AB (ref >60)
GLUCOSE: 93 mg/dL (ref 65–99)
POTASSIUM: 3.5 mmol/L (ref 3.5–5.1)
Sodium: 141 mmol/L (ref 135–145)

## 2017-01-01 MED ORDER — METOPROLOL SUCCINATE ER 25 MG PO TB24: ORAL_TABLET | ORAL | 5 refills | Status: DC

## 2017-01-01 MED ORDER — ACETAMINOPHEN 325 MG PO TABS
ORAL_TABLET | ORAL | Status: AC
  Administered 2017-01-01: 650 mg via ORAL
  Filled 2017-01-01: qty 2

## 2017-01-01 MED ORDER — ACETAMINOPHEN 325 MG PO TABS
650.0000 mg | ORAL_TABLET | Freq: Once | ORAL | Status: AC
  Administered 2017-01-01: 650 mg via ORAL

## 2017-01-01 MED ORDER — AMLODIPINE BESYLATE 5 MG PO TABS
5.0000 mg | ORAL_TABLET | Freq: Once | ORAL | Status: AC
  Administered 2017-01-01: 5 mg via ORAL

## 2017-01-01 MED ORDER — LIDOCAINE 5 % EX PTCH
1.0000 | MEDICATED_PATCH | CUTANEOUS | Status: DC
  Administered 2017-01-01: 1 via TRANSDERMAL

## 2017-01-01 MED ORDER — AMLODIPINE BESYLATE 5 MG PO TABS
ORAL_TABLET | ORAL | Status: AC
  Administered 2017-01-01: 5 mg via ORAL
  Filled 2017-01-01: qty 1

## 2017-01-01 MED ORDER — GABAPENTIN 100 MG PO CAPS: 100.0000 mg | ORAL_CAPSULE | Freq: Three times a day (TID) | ORAL | 0 refills | Status: DC

## 2017-01-01 MED ORDER — AMLODIPINE BESYLATE 5 MG PO TABS: 5.0000 mg | ORAL_TABLET | Freq: Every day | ORAL | 0 refills | Status: DC

## 2017-01-01 MED ORDER — LIDOCAINE 5 % EX PTCH: 1.0000 | MEDICATED_PATCH | Freq: Two times a day (BID) | CUTANEOUS | 0 refills | Status: DC

## 2017-01-01 MED ORDER — LIDOCAINE 5 % EX PTCH
MEDICATED_PATCH | CUTANEOUS | Status: AC
  Administered 2017-01-01: 1 via TRANSDERMAL
  Filled 2017-01-01: qty 1

## 2017-01-01 NOTE — ED Provider Notes (Signed)
[facility] Emergency Department Provider Note    First MD Initiated Contact with Patient 01/01/17 1531     (approximate)  I have reviewed the triage vital signs and the nursing notes.   HISTORY  Chief Complaint Hip Pain    HPI Erica Stevens is a 66 y.o. female who presents with left hip pain and weakness for the past several days. Patient states the pain is worse in the morning. Has trouble getting out of bed due to the pain. She uses a walker or cane but is having some weakness due to the pain. Did have a fall where she felt that her leg gave out on her. Did not hit her head. Presents today because she is worried that the pins in her hip have fractured. Denies any dysuria. No fevers. No chest pain or pressure. Does have a mild headache which is consistent with previous headaches. Lost follow-up with her primary care physician several months ago and has not been taking her home medications. Notes that her blood pressures been high but has not taking anything for it.   Past Medical History:  Diagnosis Date  . Allergic rhinitis due to pollen   . Anxiety   . Brachial neuritis or radiculitis NOS   . Cellulitis and abscess of unspecified site   . Chronic kidney disease, stage I   . Chronic pain syndrome   . Congestive heart failure, unspecified   . Coronary atherosclerosis of unspecified type of vessel, native or graft   . Degeneration of cervical intervertebral disc   . Depression   . Hypertension   . Migraine, unspecified, without mention of intractable migraine without mention of status migrainosus   . 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 Osteoporosis, unspecified   . Pure hypercholesterolemia   . Reflux esophagitis   . Thoracic or lumbosacral neuritis or radiculitis, unspecified   . Unspecified urinary incontinence    Family History  Problem Relation Age of Onset  . Hypertension Mother   . Hyperlipidemia Mother   . Heart disease Mother     Past Surgical History:  Procedure Laterality Date  . ABDOMINAL HYSTERECTOMY    . BLADDER SURGERY    . BYPASS GRAFT    . CARDIAC CATHETERIZATION  2014   [facility]  . CHOLECYSTECTOMY    . COLONOSCOPY    . CORONARY ARTERY BYPASS GRAFT     x3 grafts;[facility]  . fasciotomy     Patient Active Problem List   Diagnosis Date Noted  . Opiate abuse, episodic 05/03/2016  . Anoxia 05/03/2016  . 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 Accidental heroin overdose 05/03/2016  . Severe recurrent major depression (HCC) 05/01/2016  . Acute respiratory failure (HCC) 04/28/2016  . Angina pectoris (HCC) 02/24/2016  . Cervical disc disease 10/02/2015  . Affective disorder, major 07/02/2015  . 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 Benign hypertensive heart disease 03/09/2015  . Migraine 03/09/2015  . Billowing mitral valve 03/09/2015  . Osteopetrosis 03/09/2015  . Urge incontinence 03/09/2015  . Chest pressure 06/21/2013  . Leg edema 06/21/2013  . Shortness of breath 06/21/2013  . CAD (coronary artery disease) of artery bypass graft 06/21/2013  . Hyperlipidemia 06/21/2013  . Chronic pain 03/25/2009  . Hypercholesterolemia without hypertriglyceridemia 05/22/2007  . Coronary atherosclerosis 05/22/2007  . 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 Brachial neuritis 12/29/2006      Prior to Admission medications   Medication Sig Start Date End Date Taking? Authorizing Provider  albuterol (PROVENTIL HFA;VENTOLIN HFA) 108 (90 Base) MCG/ACT inhaler Inhale 2 puffs into the lungs every 6 (six) hours as needed for wheezing or shortness of breath. 05/02/16   Alford Highland, MD  amLODipine (NORVASC) 5 MG tablet Take 1 tablet (5 mg total) by mouth daily. 01/01/17   Willy Eddy, MD  aspirin 81 MG tablet Take 81 mg by mouth daily.    Historical Provider, MD  atorvastatin (LIPITOR) 40 MG tablet Take 1 tablet (40 mg total) by mouth daily. 02/24/16   [name], MD  enalapril  (VASOTEC) 5 MG tablet Take 1 tablet (5 mg total) by mouth 2 (two) times daily. 02/24/16   [name], MD  furosemide (LASIX) 40 MG tablet Take 0.5-1 tablets (20-40 mg total) by mouth daily. 02/24/16   [name], MD  gabapentin (NEURONTIN) 100 MG capsule Take 1 capsule (100 mg total) by mouth 3 (three) times daily. 05/02/16   Alford Highland, MD  gabapentin (NEURONTIN) 100 MG capsule Take 1-3 capsules (100-300 mg total) by mouth 3 (three) times daily. 01/01/17   Willy Eddy, MD  isosorbide mononitrate (IMDUR) 30 MG 24 hr tablet TAKE ONE (1) TABLET EACH DAY 02/24/16   [name], MD  lidocaine (LIDODERM) 5 % Place 1 patch onto the skin every 12 (twelve) hours. Remove & Discard patch within 12 hours or as directed by MD 01/01/17 01/01/18  Willy Eddy, MD  metoprolol succinate (TOPROL-XL) 25 MG 24 hr tablet TAKE ONE (1) TABLET EACH DAY 01/01/17   Willy Eddy, MD  mirtazapine (REMERON) 15 MG tablet Take 1 tablet (15 mg total) by mouth at bedtime. 05/02/16   Alford Highland, MD  montelukast (SINGULAIR) 10 MG tablet Take 1 tablet (10 mg total) by mouth at bedtime. 02/25/16   [name], MD  nicotine (NICODERM CQ - DOSED IN MG/24 HOURS) 21 mg/24hr patch Place 1 patch (21 mg total) onto the skin daily. 05/02/16   Alford Highland, MD  nitroGLYCERIN (NITROLINGUAL) 0.4 MG/SPRAY spray AS DIRECTED 07/06/15   Dennison Mascot, MD  omega-3 acid ethyl esters (LOVAZA) 1 G capsule TAKE TWO CAPSULES BY MOUTH TWICE DAILY. 07/06/15   Dennison Mascot, MD  oxyCODONE (OXY IR/ROXICODONE) 5 MG immediate release tablet Take 1 tablet (5 mg total) by mouth 2 (two) times daily as needed for severe pain. 05/02/16   Alford Highland, MD  Oxycodone HCl 10 MG TABS Take 1 tablet (10 mg total) by mouth 2 (two) times daily. 04/20/16   [name], MD  potassium chloride SA (K-DUR,KLOR-CON) 20 MEQ tablet Take 1 tablet (20 mEq total) by mouth daily. 02/24/16   [name], MD  tiZANidine (ZANAFLEX) 4 MG tablet Take 1  tablet (4 mg total) by mouth 2 (two) times daily. 02/24/16   [name], MD  tolterodine (DETROL) 2 MG tablet Take 1 tablet (2 mg total) by mouth 2 (two) times daily. 02/24/16   [name], MD  traZODone (DESYREL) 50 MG tablet Take 1 tablet (50 mg total) by mouth at bedtime as needed for sleep. 05/02/16   Alford Highland, MD  venlafaxine XR (EFFEXOR XR) 150 MG 24 hr capsule Take 1 capsule (150 mg total) by mouth daily with breakfast. 02/24/16   [name], MD    Allergies Erythromycin; Ivp dye [iodinated diagnostic agents]; Sulfa antibiotics; and Sulfur    Social History Social History  Substance Use Topics  . Smoking status: Current Every Day Smoker    Packs/day: 1.00    Types: Cigarettes  . Smokeless tobacco: Never Used  .  Alcohol use No    Review of Systems Patient denies headaches, rhinorrhea, blurry vision, numbness, shortness of breath, chest pain, edema, cough, abdominal pain, nausea, vomiting, diarrhea, dysuria, fevers, rashes or hallucinations unless otherwise stated above in HPI. ____________________________________________   PHYSICAL EXAM:  VITAL SIGNS: Vitals:   01/01/17 1310 01/01/17 1741  BP: (!) 208/94 (!) 220/108  Pulse: 87 73  Resp: 18 18  Temp: 97.9 F (36.6 C)     Constitutional: Alert and oriented, pleasant appearing and in no acute distress. Eyes: Conjunctivae are normal. PERRL. EOMI. Head: Atraumatic. Nose: No congestion/rhinnorhea. Mouth/Throat: Mucous membranes are moist.  Oropharynx non-erythematous. Neck: No stridor. Painless ROM. No cervical spine tenderness to palpation Hematological/Lymphatic/Immunilogical: No cervical lymphadenopathy. Cardiovascular: Normal rate, regular rhythm. Grossly normal heart sounds.  Good peripheral circulation. Respiratory: Normal respiratory effort.  No retractions. Lungs CTAB. Gastrointestinal: Soft and nontender. No distention. No abdominal bruits. No CVA tenderness. Musculoskeletal: full passive  painless rom of left hip.  No pain with internal or external rotaion.  No pain of knee. No LE swelling.  No erythema or induration.  Brisk cap refill No joint effusions. Neurologic:  Normal speech and language. No gross focal neurologic deficits are appreciated. No facial droop.  No wealness Skin:  Skin is warm, dry and intact. No rash noted. Psychiatric: Mood and affect are normal. Speech and behavior are normal.  ____________________________________________   LABS (all labs ordered are listed, but only abnormal results are displayed)  Results for orders placed or performed during the hospital encounter of 01/01/17 (from the past 24 hour(s))  Basic metabolic panel     Status: Abnormal   Collection Time: 01/01/17  4:13 PM  Result Value Ref Range   Sodium 141 135 - 145 mmol/L   Potassium 3.5 3.5 - 5.1 mmol/L   Chloride 106 101 - 111 mmol/L   CO2 29 22 - 32 mmol/L   Glucose, Bld 93 65 - 99 mg/dL   BUN 12 6 - 20 mg/dL   Creatinine, Ser 7.56 (H) 0.44 - 1.00 mg/dL   Calcium 9.2 8.9 - 43.3 mg/dL   GFR calc non Af Amer 42 (L) >60 mL/min   GFR calc Af Amer 48 (L) >60 mL/min   Anion gap 6 5 - 15  CBC with Differential/Platelet     Status: Abnormal   Collection Time: 01/01/17  4:13 PM  Result Value Ref Range   WBC 7.7 3.6 - 11.0 K/uL   RBC 4.58 3.80 - 5.20 MIL/uL   Hemoglobin 14.5 12.0 - 16.0 g/dL   HCT 29.5 18.8 - 41.6 %   MCV 92.1 80.0 - 100.0 fL   MCH 31.7 26.0 - 34.0 pg   MCHC 34.4 32.0 - 36.0 g/dL   RDW 60.6 (H) 30.1 - 60.1 %   Platelets 280 150 - 440 K/uL   Neutrophils Relative % 55 %   Neutro Abs 4.2 1.4 - 6.5 K/uL   Lymphocytes Relative 36 %   Lymphs Abs 2.8 1.0 - 3.6 K/uL   Monocytes Relative 6 %   Monocytes Absolute 0.4 0.2 - 0.9 K/uL   Eosinophils Relative 3 %   Eosinophils Absolute 0.3 0 - 0.7 K/uL   Basophils Relative 0 %   Basophils Absolute 0.0 0 - 0.1 K/uL    ____________________________________________  ____________________________________________  RADIOLOGY  I personally reviewed all radiographic images ordered to evaluate for the above acute complaints and reviewed radiology reports and findings.  These findings were personally discussed with the patient.  Please  see medical record for radiology report.  ____________________________________________   PROCEDURES  Procedure(s) performed:  Procedures    Critical Care performed: no ____________________________________________   INITIAL IMPRESSION / ASSESSMENT AND PLAN / ED COURSE  Pertinent labs & imaging results that were available during my care of the patient were reviewed by me and considered in my medical decision making (see chart for details).  DDX: fracture, contusion, radiculopathy, bursitits, arthritis.  Katera Rybka is a 66 y.o. who presents to the ED with left hip pain as described above. Patient pleasant appearing. Noted to be hypertensive and has not been taking her home medications. She denies any chest pain or shortness of breath. Neuro exam is intact. There is no focal neuro deficits to suggest CVA. Patient adamantly denies falling and hitting her head. No neck pain. Have recommended CT imaging to evaluate for any possible abnormality given her fall but the patient has refused demonstrate understanding of the risks associated with not diagnosing a head injury or stroke. X-ray imaging of the left hip shows no evidence of fracture or dislocation. There is no evidence of lumbar fracture. She has no pain with internal rotation of the left hip. She has no fever or leukocytosis. This is not clinically consistent with septic arthritis. I do suspect there is some component of chronic arthritis and deconditioning given her pain worse in the morning that improves throughout the day. Patient is requesting discharge home. Give patient her home medications for her blood pressure.  Patient states that she does not have refills of her other home medications which I will restart. Discussed signs and symptoms for which she should return to the hospital.  Made outpatient referral to PT.  Have discussed with the patient and available family all diagnostics and treatments performed thus far and all questions were answered to the best of my ability. The patient demonstrates understanding and agreement with plan.       ____________________________________________   FINAL CLINICAL IMPRESSION(S) / ED DIAGNOSES  Final diagnoses:  Left hip pain      NEW MEDICATIONS STARTED DURING THIS VISIT:  Discharge Medication List as of 01/01/2017  5:34 PM    START taking these medications   Details  lidocaine (LIDODERM) 5 % Place 1 patch onto the skin every 12 (twelve) hours. Remove & Discard patch within 12 hours or as directed by MD, Starting Sun 01/01/2017, Until Mon 01/01/2018, Print         Note:  This document was prepared using Dragon voice recognition software and may include unintentional dictation errors.    Willy Eddy, MD 01/01/17 1750

## 2017-01-01 NOTE — Care Management Note (Signed)
Case Management Note  Patient Details  Name: Celester Morgan MRN: [id number] Date of Birth: [birth year]  Subjective/Objective:   Patient requesting outpatient physical therapy at [facility]  Physical Therapy . [address],[place] [address]. Phone [phone].  She is a previous client of [facility] she will call to start services.           Action/Plan:Information address Patient given address, phone number and script for PT Left hip pain. Strengthening, balance training,range of motion and use of assistive devices. Patient verbalized understanding of the above.       Expected Discharge Date:                  Expected Discharge Plan:     In-House Referral:     Discharge planning Services     Post Acute Care Choice:    Choice offered to:     DME Arranged:    DME Agency:     HH Arranged:    HH Agency:     Status of Service:     If discussed at [employer], dates discussed:    Additional Comments:  Caren Macadam, RN 01/01/2017, 5:27 PM

## 2017-01-01 NOTE — ED Notes (Signed)
Patient is complaining of left hip pain x 1 week with increasing severity and pain.  Patient reports multiple falls and difficulty getting up to the restroom.

## 2017-01-01 NOTE — ED Triage Notes (Signed)
Pt comes into the ED via POv c/o left hip pain.  Patient states she is unable to put weight on that hip.  Patient explains that she has a h/o of a pin being placed in that hip 3 years ago.  Denies any injury to the hip.  Patient in NAD at this time with even and unlabored respirations.

## 2017-01-01 NOTE — Discharge Instructions (Signed)
Please follow up with your orthopedic surgeon and PCP.  Follow up with rehab.  Return for fevers, worseing pain, weakness or any additional complaints or concerns.

## 2017-01-01 NOTE — ED Notes (Signed)
ED Provider at bedside. 

## 2017-01-09 ENCOUNTER — Telehealth: Payer: Self-pay | Admitting: Family Medicine

## 2017-01-09 NOTE — Telephone Encounter (Signed)
Called Pt to schedule AWV with [facility] - knb °

## 2017-01-12 ENCOUNTER — Inpatient Hospital Stay
Admission: EM | Admit: 2017-01-12 | Discharge: 2017-01-14 | DRG: 917 | Disposition: A | Discharge status: Home / Self Care | Payer: Medicare HMO | Attending: Internal Medicine | Admitting: Internal Medicine

## 2017-01-12 ENCOUNTER — Emergency Department
Admission: EM | Admit: 2017-01-12 | Discharge: 2017-01-12 | Disposition: A | Discharge status: Home / Self Care | Payer: Medicare HMO | Source: Home / Self Care | Attending: Emergency Medicine | Admitting: Emergency Medicine

## 2017-01-12 ENCOUNTER — Emergency Department: Payer: Medicare HMO

## 2017-01-12 ENCOUNTER — Encounter: Payer: Self-pay | Admitting: Emergency Medicine

## 2017-01-12 DIAGNOSIS — I130 Hypertensive heart and chronic kidney disease with heart failure and stage 1 through stage 4 chronic kidney disease, or unspecified chronic kidney disease: Secondary | ICD-10-CM | POA: Diagnosis present

## 2017-01-12 DIAGNOSIS — Z9049 Acquired absence of other specified parts of digestive tract: Secondary | ICD-10-CM | POA: Diagnosis not present

## 2017-01-12 DIAGNOSIS — E785 Hyperlipidemia, unspecified: Secondary | ICD-10-CM | POA: Diagnosis present

## 2017-01-12 DIAGNOSIS — Z79899 Other long term (current) drug therapy: Secondary | ICD-10-CM

## 2017-01-12 DIAGNOSIS — I509 Heart failure, unspecified: Secondary | ICD-10-CM

## 2017-01-12 DIAGNOSIS — J690 Pneumonitis due to inhalation of food and vomit: Secondary | ICD-10-CM | POA: Diagnosis present

## 2017-01-12 DIAGNOSIS — T40601A Poisoning by unspecified narcotics, accidental (unintentional), initial encounter: Secondary | ICD-10-CM

## 2017-01-12 DIAGNOSIS — G894 Chronic pain syndrome: Secondary | ICD-10-CM | POA: Diagnosis present

## 2017-01-12 DIAGNOSIS — I5020 Unspecified systolic (congestive) heart failure: Secondary | ICD-10-CM

## 2017-01-12 DIAGNOSIS — F17210 Nicotine dependence, cigarettes, uncomplicated: Secondary | ICD-10-CM | POA: Diagnosis present

## 2017-01-12 DIAGNOSIS — G92 Toxic encephalopathy: Secondary | ICD-10-CM | POA: Diagnosis not present

## 2017-01-12 DIAGNOSIS — Z7982 Long term (current) use of aspirin: Secondary | ICD-10-CM

## 2017-01-12 DIAGNOSIS — T40602A Poisoning by unspecified narcotics, intentional self-harm, initial encounter: Principal | ICD-10-CM | POA: Diagnosis present

## 2017-01-12 DIAGNOSIS — R4182 Altered mental status, unspecified: Secondary | ICD-10-CM

## 2017-01-12 DIAGNOSIS — R4020 Unspecified coma: Secondary | ICD-10-CM | POA: Diagnosis not present

## 2017-01-12 DIAGNOSIS — T402X1A Poisoning by other opioids, accidental (unintentional), initial encounter: Secondary | ICD-10-CM | POA: Insufficient documentation

## 2017-01-12 DIAGNOSIS — Z91041 Radiographic dye allergy status: Secondary | ICD-10-CM

## 2017-01-12 DIAGNOSIS — N181 Chronic kidney disease, stage 1: Secondary | ICD-10-CM | POA: Diagnosis present

## 2017-01-12 DIAGNOSIS — I248 Other forms of acute ischemic heart disease: Secondary | ICD-10-CM | POA: Diagnosis not present

## 2017-01-12 DIAGNOSIS — Z90710 Acquired absence of both cervix and uterus: Secondary | ICD-10-CM | POA: Diagnosis not present

## 2017-01-12 DIAGNOSIS — Z882 Allergy status to sulfonamides status: Secondary | ICD-10-CM | POA: Diagnosis not present

## 2017-01-12 DIAGNOSIS — F1910 Other psychoactive substance abuse, uncomplicated: Secondary | ICD-10-CM | POA: Insufficient documentation

## 2017-01-12 DIAGNOSIS — F419 Anxiety disorder, unspecified: Secondary | ICD-10-CM | POA: Diagnosis present

## 2017-01-12 DIAGNOSIS — I2510 Atherosclerotic heart disease of native coronary artery without angina pectoris: Secondary | ICD-10-CM | POA: Diagnosis not present

## 2017-01-12 DIAGNOSIS — J189 Pneumonia, unspecified organism: Secondary | ICD-10-CM | POA: Diagnosis not present

## 2017-01-12 DIAGNOSIS — I25119 Atherosclerotic heart disease of native coronary artery with unspecified angina pectoris: Secondary | ICD-10-CM | POA: Diagnosis present

## 2017-01-12 DIAGNOSIS — E7800 Pure hypercholesterolemia, unspecified: Secondary | ICD-10-CM | POA: Diagnosis present

## 2017-01-12 DIAGNOSIS — T50904A Poisoning by unspecified drugs, medicaments and biological substances, undetermined, initial encounter: Secondary | ICD-10-CM

## 2017-01-12 DIAGNOSIS — I25810 Atherosclerosis of coronary artery bypass graft(s) without angina pectoris: Secondary | ICD-10-CM | POA: Insufficient documentation

## 2017-01-12 DIAGNOSIS — R748 Abnormal levels of other serum enzymes: Secondary | ICD-10-CM | POA: Diagnosis not present

## 2017-01-12 DIAGNOSIS — R7989 Other specified abnormal findings of blood chemistry: Secondary | ICD-10-CM

## 2017-01-12 DIAGNOSIS — Z881 Allergy status to other antibiotic agents status: Secondary | ICD-10-CM

## 2017-01-12 DIAGNOSIS — E876 Hypokalemia: Secondary | ICD-10-CM | POA: Diagnosis not present

## 2017-01-12 DIAGNOSIS — F1410 Cocaine abuse, uncomplicated: Secondary | ICD-10-CM | POA: Diagnosis present

## 2017-01-12 DIAGNOSIS — F1310 Sedative, hypnotic or anxiolytic abuse, uncomplicated: Secondary | ICD-10-CM | POA: Diagnosis present

## 2017-01-12 DIAGNOSIS — F329 Major depressive disorder, single episode, unspecified: Secondary | ICD-10-CM | POA: Diagnosis not present

## 2017-01-12 DIAGNOSIS — I209 Angina pectoris, unspecified: Secondary | ICD-10-CM

## 2017-01-12 DIAGNOSIS — A419 Sepsis, unspecified organism: Secondary | ICD-10-CM

## 2017-01-12 DIAGNOSIS — Z79891 Long term (current) use of opiate analgesic: Secondary | ICD-10-CM

## 2017-01-12 DIAGNOSIS — R6889 Other general symptoms and signs: Secondary | ICD-10-CM | POA: Diagnosis not present

## 2017-01-12 DIAGNOSIS — F1110 Opioid abuse, uncomplicated: Secondary | ICD-10-CM | POA: Diagnosis present

## 2017-01-12 DIAGNOSIS — Z951 Presence of aortocoronary bypass graft: Secondary | ICD-10-CM

## 2017-01-12 DIAGNOSIS — Z8249 Family history of ischemic heart disease and other diseases of the circulatory system: Secondary | ICD-10-CM | POA: Diagnosis not present

## 2017-01-12 DIAGNOSIS — K920 Hematemesis: Secondary | ICD-10-CM | POA: Diagnosis not present

## 2017-01-12 DIAGNOSIS — I119 Hypertensive heart disease without heart failure: Secondary | ICD-10-CM

## 2017-01-12 DIAGNOSIS — Z716 Tobacco abuse counseling: Secondary | ICD-10-CM | POA: Diagnosis not present

## 2017-01-12 DIAGNOSIS — G9340 Encephalopathy, unspecified: Secondary | ICD-10-CM | POA: Diagnosis not present

## 2017-01-12 DIAGNOSIS — T50901A Poisoning by unspecified drugs, medicaments and biological substances, accidental (unintentional), initial encounter: Secondary | ICD-10-CM | POA: Diagnosis not present

## 2017-01-12 DIAGNOSIS — T50904D Poisoning by unspecified drugs, medicaments and biological substances, undetermined, subsequent encounter: Secondary | ICD-10-CM | POA: Diagnosis not present

## 2017-01-12 DIAGNOSIS — M810 Age-related osteoporosis without current pathological fracture: Secondary | ICD-10-CM | POA: Diagnosis present

## 2017-01-12 DIAGNOSIS — R778 Other specified abnormalities of plasma proteins: Secondary | ICD-10-CM

## 2017-01-12 DIAGNOSIS — R400 Somnolence: Secondary | ICD-10-CM | POA: Diagnosis not present

## 2017-01-12 DIAGNOSIS — R9431 Abnormal electrocardiogram [ECG] [EKG]: Secondary | ICD-10-CM | POA: Diagnosis not present

## 2017-01-12 DIAGNOSIS — I10 Essential (primary) hypertension: Secondary | ICD-10-CM | POA: Diagnosis not present

## 2017-01-12 LAB — BRAIN NATRIURETIC PEPTIDE: B Natriuretic Peptide: 69 pg/mL (ref 0.0–100.0)

## 2017-01-12 LAB — CBC WITH DIFFERENTIAL/PLATELET
BASOS ABS: 0 10*3/uL (ref 0–0.1)
BASOS ABS: 0 10*3/uL (ref 0–0.1)
BASOS PCT: 0 %
Basophils Relative: 0 %
EOS ABS: 0.1 10*3/uL (ref 0–0.7)
Eosinophils Absolute: 0.4 10*3/uL (ref 0–0.7)
Eosinophils Relative: 3 %
Eosinophils Relative: 0 %
HEMATOCRIT: 40.7 % (ref 35.0–47.0)
HEMATOCRIT: 40.2 % (ref 35.0–47.0)
HEMOGLOBIN: 13.9 g/dL (ref 12.0–16.0)
Hemoglobin: 14 g/dL (ref 12.0–16.0)
LYMPHS ABS: 3.1 10*3/uL (ref 1.0–3.6)
LYMPHS PCT: 21 %
Lymphocytes Relative: 5 %
Lymphs Abs: 1 10*3/uL (ref 1.0–3.6)
MCH: 32.1 pg (ref 26.0–34.0)
MCH: 31.5 pg (ref 26.0–34.0)
MCHC: 34.4 g/dL (ref 32.0–36.0)
MCHC: 34.5 g/dL (ref 32.0–36.0)
MCV: 93.3 fL (ref 80.0–100.0)
MCV: 91.4 fL (ref 80.0–100.0)
MONO ABS: 0.9 10*3/uL (ref 0.2–0.9)
MONOS PCT: 6 %
Monocytes Absolute: 1.1 10*3/uL — ABNORMAL HIGH (ref 0.2–0.9)
Monocytes Relative: 5 %
NEUTROS ABS: 10.3 10*3/uL — AB (ref 1.4–6.5)
NEUTROS ABS: 19.2 10*3/uL — AB (ref 1.4–6.5)
NEUTROS PCT: 90 %
Neutrophils Relative %: 70 %
Platelets: 351 10*3/uL (ref 150–440)
Platelets: 271 10*3/uL (ref 150–440)
RBC: 4.36 MIL/uL (ref 3.80–5.20)
RBC: 4.39 MIL/uL (ref 3.80–5.20)
RDW: 15.1 % — AB (ref 11.5–14.5)
RDW: 14.7 % — AB (ref 11.5–14.5)
WBC: 14.7 10*3/uL — ABNORMAL HIGH (ref 3.6–11.0)
WBC: 21.4 10*3/uL — ABNORMAL HIGH (ref 3.6–11.0)

## 2017-01-12 LAB — URINALYSIS, COMPLETE (UACMP) WITH MICROSCOPIC
Bacteria, UA: NONE SEEN
Bilirubin Urine: NEGATIVE
GLUCOSE, UA: 50 mg/dL — AB
Hgb urine dipstick: NEGATIVE
Ketones, ur: NEGATIVE mg/dL
Leukocytes, UA: NEGATIVE
Nitrite: NEGATIVE
PH: 5 (ref 5.0–8.0)
PROTEIN: 30 mg/dL — AB
Specific Gravity, Urine: 1.017 (ref 1.005–1.030)

## 2017-01-12 LAB — URINE DRUG SCREEN, QUALITATIVE (ARMC ONLY)
AMPHETAMINES, UR SCREEN: NOT DETECTED
BENZODIAZEPINE, UR SCRN: NOT DETECTED
Barbiturates, Ur Screen: NOT DETECTED
CANNABINOID 50 NG, UR ~~LOC~~: NOT DETECTED
COCAINE METABOLITE, UR ~~LOC~~: POSITIVE — AB
MDMA (ECSTASY) UR SCREEN: NOT DETECTED
Methadone Scn, Ur: NOT DETECTED
Opiate, Ur Screen: POSITIVE — AB
Phencyclidine (PCP) Ur S: NOT DETECTED
Tricyclic, Ur Screen: NOT DETECTED

## 2017-01-12 LAB — GLUCOSE, CAPILLARY: Glucose-Capillary: 133 mg/dL — ABNORMAL HIGH (ref 65–99)

## 2017-01-12 LAB — COMPREHENSIVE METABOLIC PANEL
ALT: 25 U/L (ref 14–54)
AST: 54 U/L — ABNORMAL HIGH (ref 15–41)
Albumin: 4.1 g/dL (ref 3.5–5.0)
Alkaline Phosphatase: 90 U/L (ref 38–126)
Anion gap: 8 (ref 5–15)
BUN: 11 mg/dL (ref 6–20)
CO2: 30 mmol/L (ref 22–32)
Calcium: 8.7 mg/dL — ABNORMAL LOW (ref 8.9–10.3)
Chloride: 100 mmol/L — ABNORMAL LOW (ref 101–111)
Creatinine, Ser: 0.98 mg/dL (ref 0.44–1.00)
GFR calc Af Amer: >60 mL/min (ref >60)
GFR calc non Af Amer: 59 mL/min — ABNORMAL LOW (ref >60)
Glucose, Bld: 160 mg/dL — ABNORMAL HIGH (ref 65–99)
Potassium: 3.4 mmol/L — ABNORMAL LOW (ref 3.5–5.1)
Sodium: 138 mmol/L (ref 135–145)
Total Bilirubin: 0.7 mg/dL (ref 0.3–1.2)
Total Protein: 8 g/dL (ref 6.5–8.1)

## 2017-01-12 LAB — ETHANOL: Alcohol, Ethyl (B): <5 mg/dL (ref <5)

## 2017-01-12 LAB — TROPONIN I: Troponin I: <0.03 ng/mL (ref <0.03)

## 2017-01-12 LAB — SALICYLATE LEVEL: Salicylate Lvl: <7 mg/dL (ref 2.8–30.0)

## 2017-01-12 LAB — ACETAMINOPHEN LEVEL

## 2017-01-12 MED ORDER — SODIUM CHLORIDE 0.9 % IV BOLUS (SEPSIS)
1000.0000 mL | Freq: Once | INTRAVENOUS | Status: AC
  Administered 2017-01-12: 1000 mL via INTRAVENOUS

## 2017-01-12 MED ORDER — PIPERACILLIN-TAZOBACTAM 3.375 G IVPB 30 MIN
3.3750 g | Freq: Once | INTRAVENOUS | Status: AC
  Administered 2017-01-13: 3.375 g via INTRAVENOUS
  Filled 2017-01-12: qty 50

## 2017-01-12 MED ORDER — ACETAMINOPHEN 650 MG RE SUPP
975.0000 mg | Freq: Once | RECTAL | Status: AC
  Administered 2017-01-13: 975 mg via RECTAL
  Filled 2017-01-12: qty 1

## 2017-01-12 MED ORDER — NALOXONE HCL 0.4 MG/0.4ML IJ SOAJ: INTRAMUSCULAR | 1 refills | Status: DC

## 2017-01-12 MED ORDER — VANCOMYCIN HCL IN DEXTROSE 1-5 GM/200ML-% IV SOLN
1000.0000 mg | Freq: Once | INTRAVENOUS | Status: AC
  Administered 2017-01-13: 1000 mg via INTRAVENOUS
  Filled 2017-01-12: qty 200

## 2017-01-12 NOTE — ED Notes (Signed)
Pt given coke to drink per request.  

## 2017-01-12 NOTE — ED Notes (Addendum)
Patient gave permission to look into purse. Found 6 bottles of trazodone 100mg . One bottle empty and another bottle with 1 pill. Some with another persons name on them. Patient denies taken any pills.

## 2017-01-12 NOTE — ED Provider Notes (Signed)
[facility] Emergency Department Provider Note   ____________________________________________   First MD Initiated Contact with Patient 01/12/17 2314     (approximate)  I have reviewed the triage vital signs and the nursing notes.   HISTORY  Chief Complaint Altered Mental Status  Limited by somnolence  HPI Erica Stevens is a 66 y.o. female who was discharged several hours ago from the emergency department status post drug overdose from opiates and cocaine. She was awaiting her ride outside when she was found to be somnolent with emesis on her clothing. She was brought back into the emergency department now disoriented, somnolent, shivering and otherwise with altered mental state different from her mental state at discharge. 6 bottles of trazodone 100 mg were found in her purse. All the bottles are empty with the exception of one remaining pill. Patient denies taking pills or using cocaine.Unable to give remaining history secondary to somnolence.   Past Medical History:  Diagnosis Date  . Allergic rhinitis due to pollen   . Anxiety   . Brachial neuritis or radiculitis NOS   . Cellulitis and abscess of unspecified site   . Chronic kidney disease, stage I   . Chronic pain syndrome   . Congestive heart failure, unspecified   . Coronary atherosclerosis of unspecified type of vessel, native or graft   . Degeneration of cervical intervertebral disc   . Depression   . Hypertension   . Migraine, unspecified, without mention of intractable migraine without mention of status migrainosus   . 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 Osteoporosis, unspecified   . Pure hypercholesterolemia   . Reflux esophagitis   . Thoracic or lumbosacral neuritis or radiculitis, unspecified   . Unspecified urinary incontinence     Patient Active Problem List   Diagnosis Date Noted  . Aspiration pneumonia (HCC) 01/13/2017  . Opiate abuse, episodic 05/03/2016  . Anoxia  05/03/2016  . 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 Accidental heroin overdose 05/03/2016  . Severe recurrent major depression (HCC) 05/01/2016  . Acute respiratory failure (HCC) 04/28/2016  . Angina pectoris (HCC) 02/24/2016  . Cervical disc disease 10/02/2015  . Affective disorder, major 07/02/2015  . 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 Benign hypertensive heart disease 03/09/2015  . Migraine 03/09/2015  . Billowing mitral valve 03/09/2015  . Osteopetrosis 03/09/2015  . Urge incontinence 03/09/2015  . Chest pressure 06/21/2013  . Leg edema 06/21/2013  . Shortness of breath 06/21/2013  . CAD (coronary artery disease) of artery bypass graft 06/21/2013  . Hyperlipidemia 06/21/2013  . Chronic pain 03/25/2009  . Hypercholesterolemia without hypertriglyceridemia 05/22/2007  . Coronary atherosclerosis 05/22/2007  . 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 Brachial neuritis 12/29/2006    Past Surgical History:  Procedure Laterality Date  . ABDOMINAL HYSTERECTOMY    . BLADDER SURGERY    . BYPASS GRAFT    . CARDIAC CATHETERIZATION  2014   [facility]  . CHOLECYSTECTOMY    . COLONOSCOPY    . CORONARY ARTERY BYPASS GRAFT     x3 grafts;[facility]  . fasciotomy      Prior to Admission medications   Medication Sig Start Date End Date Taking? Authorizing Provider  albuterol (PROVENTIL HFA;VENTOLIN HFA) 108 (90 Base) MCG/ACT inhaler Inhale 2 puffs into the lungs every 6 (six) hours as needed for wheezing or shortness of breath. 05/02/16   Alford Highland, MD  amLODipine (NORVASC) 5 MG tablet Take 1 tablet (5 mg total) by mouth daily. 01/01/17  Willy Eddy, MD  aspirin 81 MG tablet Take 81 mg by mouth daily.    Historical Provider, MD  atorvastatin (LIPITOR) 40 MG tablet Take 1 tablet (40 mg total) by mouth daily. 02/24/16   [name], MD  enalapril (VASOTEC) 5 MG tablet Take 1 tablet (5 mg total) by mouth 2 (two) times daily. 02/24/16   [name], MD    furosemide (LASIX) 40 MG tablet Take 0.5-1 tablets (20-40 mg total) by mouth daily. 02/24/16   [name], MD  gabapentin (NEURONTIN) 100 MG capsule Take 1 capsule (100 mg total) by mouth 3 (three) times daily. 05/02/16   Alford Highland, MD  gabapentin (NEURONTIN) 100 MG capsule Take 1-3 capsules (100-300 mg total) by mouth 3 (three) times daily. 01/01/17   Willy Eddy, MD  isosorbide mononitrate (IMDUR) 30 MG 24 hr tablet TAKE ONE (1) TABLET EACH DAY 02/24/16   [name], MD  lidocaine (LIDODERM) 5 % Place 1 patch onto the skin every 12 (twelve) hours. Remove & Discard patch within 12 hours or as directed by MD 01/01/17 01/01/18  Willy Eddy, MD  metoprolol succinate (TOPROL-XL) 25 MG 24 hr tablet TAKE ONE (1) TABLET EACH DAY 01/01/17   Willy Eddy, MD  mirtazapine (REMERON) 15 MG tablet Take 1 tablet (15 mg total) by mouth at bedtime. 05/02/16   Alford Highland, MD  montelukast (SINGULAIR) 10 MG tablet Take 1 tablet (10 mg total) by mouth at bedtime. 02/25/16   [name], MD  Naloxone HCl 0.4 MG/0.4ML SOAJ Follow package instructions for heroin or opioid overdose. 01/12/17   Sharman Cheek, MD  nicotine (NICODERM CQ - DOSED IN MG/24 HOURS) 21 mg/24hr patch Place 1 patch (21 mg total) onto the skin daily. 05/02/16   Alford Highland, MD  nitroGLYCERIN (NITROLINGUAL) 0.4 MG/SPRAY spray AS DIRECTED 07/06/15   Dennison Mascot, MD  omega-3 acid ethyl esters (LOVAZA) 1 G capsule TAKE TWO CAPSULES BY MOUTH TWICE DAILY. 07/06/15   Dennison Mascot, MD  oxyCODONE (OXY IR/ROXICODONE) 5 MG immediate release tablet Take 1 tablet (5 mg total) by mouth 2 (two) times daily as needed for severe pain. 05/02/16   Alford Highland, MD  Oxycodone HCl 10 MG TABS Take 1 tablet (10 mg total) by mouth 2 (two) times daily. 04/20/16   [name], MD  potassium chloride SA (K-DUR,KLOR-CON) 20 MEQ tablet Take 1 tablet (20 mEq total) by mouth daily. 02/24/16   [name], MD  tiZANidine (ZANAFLEX) 4 MG  tablet Take 1 tablet (4 mg total) by mouth 2 (two) times daily. 02/24/16   [name], MD  tolterodine (DETROL) 2 MG tablet Take 1 tablet (2 mg total) by mouth 2 (two) times daily. 02/24/16   [name], MD  traZODone (DESYREL) 50 MG tablet Take 1 tablet (50 mg total) by mouth at bedtime as needed for sleep. 05/02/16   Alford Highland, MD  venlafaxine XR (EFFEXOR XR) 150 MG 24 hr capsule Take 1 capsule (150 mg total) by mouth daily with breakfast. 02/24/16   [name], MD    Allergies Erythromycin; Ivp dye [iodinated diagnostic agents]; Sulfa antibiotics; and Sulfur  Family History  Problem Relation Age of Onset  . Hypertension Mother   . Hyperlipidemia Mother   . Heart disease Mother     Social History Social History  Substance Use Topics  . Smoking status: Current Every Day Smoker    Packs/day: 1.00    Types: Cigarettes  . Smokeless tobacco: Never Used  . Alcohol use No  Review of Systems  Constitutional: Positive for fever/chills. Eyes: No visual changes. ENT: No sore throat. Cardiovascular: Denies chest pain. Respiratory: Denies shortness of breath. Gastrointestinal: No abdominal pain.  Positive for vomiting.  No diarrhea.  No constipation. Genitourinary: Negative for dysuria. Musculoskeletal: Negative for back pain. Skin: Negative for rash. Neurological: Negative for headaches, focal weakness or numbness.  Limited by somnolence  ____________________________________________   PHYSICAL EXAM:  VITAL SIGNS: ED Triage Vitals  Enc Vitals Group     BP 01/12/17 2246 (!) 178/64     Pulse Rate 01/12/17 2246 98     Resp --      Temp 01/12/17 2247 (!) 101.2 F (38.4 C)     Temp Source 01/12/17 2247 Oral     SpO2 01/12/17 2246 92 %     Weight 01/12/17 2247 130 lb (59 kg)     Height 01/12/17 2247 5' (1.524 m)     Head Circumference --      Peak Flow --      Pain Score --      Pain Loc --      Pain Edu? --      Excl. in GC? --     Constitutional:  Somnolent but arousable to voice. Disheveled appearing and in no acute distress. Eyes: Conjunctivae are normal. Pinpoint pupils bilaterally. PERRL. EOMI. Head: Atraumatic. Nose: No congestion/rhinnorhea. Mouth/Throat: Mucous membranes are moist.  Oropharynx non-erythematous. Neck: No stridor.  No cervical spine tenderness to palpation.  No step-offs or deformities. Cardiovascular: Normal rate, regular rhythm. Grossly normal heart sounds.  Good peripheral circulation. Respiratory: Normal respiratory effort.  No retractions. Lungs with scattered rhonchi. Gastrointestinal: Soft and nontender. No distention. No abdominal bruits. No CVA tenderness. Musculoskeletal: No lower extremity tenderness nor edema.  No joint effusions. Neurologic:  Slurred speech and language as if intoxicated. No gross focal neurologic deficits are appreciated. MAEx4. Follow simple commands. Skin:  Skin is warm, dry and intact. No rash noted. Psychiatric: Unable to assess secondary to somnolence. ____________________________________________   LABS (all labs ordered are listed, but only abnormal results are displayed)  Labs Reviewed  CBC WITH DIFFERENTIAL/PLATELET - Abnormal; Notable for the following:       Result Value   WBC 21.4 (*)    RDW 14.7 (*)    Neutro Abs 19.2 (*)    Monocytes Absolute 1.1 (*)    All other components within normal limits  COMPREHENSIVE METABOLIC PANEL - Abnormal; Notable for the following:    Glucose, Bld 128 (*)    Total Protein 8.3 (*)    AST 47 (*)    All other components within normal limits  ACETAMINOPHEN LEVEL - Abnormal; Notable for the following:    Acetaminophen (Tylenol), Serum <10 (*)    All other components within normal limits  TROPONIN I - Abnormal; Notable for the following:    Troponin I 0.07 (*)    All other components within normal limits  URINE DRUG SCREEN, QUALITATIVE ([facility] ONLY) - Abnormal; Notable for the following:    Cocaine Metabolite,Ur [place] POSITIVE (*)     Opiate, Ur Screen POSITIVE (*)    Benzodiazepine, Ur Scrn POSITIVE (*)    All other components within normal limits  URINALYSIS, COMPLETE (UACMP) WITH MICROSCOPIC - Abnormal; Notable for the following:    Color, Urine YELLOW (*)    APPearance HAZY (*)    Hgb urine dipstick SMALL (*)    Squamous Epithelial / LPF 0-5 (*)    All other components within  normal limits  GLUCOSE, CAPILLARY - Abnormal; Notable for the following:    Glucose-Capillary 133 (*)    All other components within normal limits  BLOOD GAS, ARTERIAL - Abnormal; Notable for the following:    pO2, Arterial 57 (*)    Bicarbonate 29.8 (*)    Acid-Base Excess 4.3 (*)    All other components within normal limits  GLUCOSE, CAPILLARY - Abnormal; Notable for the following:    Glucose-Capillary 117 (*)    All other components within normal limits  MRSA PCR SCREENING  CULTURE, BLOOD (ROUTINE X 2)  CULTURE, BLOOD (ROUTINE X 2)  CULTURE, EXPECTORATED SPUTUM-ASSESSMENT  GRAM STAIN  SALICYLATE LEVEL  LACTIC ACID, PLASMA  AMMONIA  ETHANOL  LACTIC ACID, PLASMA  HIV ANTIBODY (ROUTINE TESTING)  STREP PNEUMONIAE URINARY ANTIGEN  TROPONIN I  TROPONIN I  TROPONIN I   ____________________________________________  EKG  ED ECG REPORT I, Marcena Dias J, the attending physician, personally viewed and interpreted this ECG.   Date: 01/13/2017  EKG Time: 2335  Rate: 74  Rhythm: normal EKG, normal sinus rhythm  Axis: Normal  Intervals:none  ST&T Change: Nonspecific  ____________________________________________  RADIOLOGY  CT head interpreted per Dr.Fujinaga: 1. Negative for hemorrhage or intracranial mass.  2. Mild to moderate atrophy. Mildly enlarged ventricles, slightly  increased compared to the 2013 comparison study.   Portable chest x-ray (viewed by me, interpreted per Dr. Clovis Riley): Stable mild cardiomegaly. No consolidation or effusion. Normal  vasculature.    ____________________________________________   PROCEDURES  Procedure(s) performed: None  Procedures  Critical Care performed: Yes, see critical care note(s)   CRITICAL CARE Performed by: Irean Hong   Total critical care time: 45 minutes  Critical care time was exclusive of separately billable procedures and treating other patients.  Critical care was necessary to treat or prevent imminent or life-threatening deterioration.  Critical care was time spent personally by me on the following activities: development of treatment plan with patient and/or surrogate as well as nursing, discussions with consultants, evaluation of patient's response to treatment, examination of patient, obtaining history from patient or surrogate, ordering and performing treatments and interventions, ordering and review of laboratory studies, ordering and review of radiographic studies, pulse oximetry and re-evaluation of patient's condition.  ____________________________________________   INITIAL IMPRESSION / ASSESSMENT AND PLAN / ED COURSE  Pertinent labs & imaging results that were available during my care of the patient were reviewed by me and considered in my medical decision making (see chart for details).  66 year old female found semi-responsive in the ED parking lot with emesis on her clothing status post discharge for opiate and cocaine overdose. Primary nurse who discharged her states her mental state is very different from prior, and she is now running a fever. Suspect aspiration in the setting of drug overdose. Will obtain screening lab work, blood cultures, x-ray and head CT.  Clinical Course as of Jan 14 427  Fri Jan 13, 2017  0000 Patient's son now at bedside who desires for patient to be involuntarily committed. He feels she is a danger to herself, is involved in drugs with her boyfriend. States the medicines found in her purse are not even her; therefore her boyfriend's. He is not sure  if she is intentionally overdosing to harm herself.  [JS]  0001 Leukocytosis noted. ED Code sepsis initiated. Will administer broad-spectrum antibiotics.  [JS]  0030 Will add IV clindamycin for likely aspiration pneumonia which has not yet blossomed out on chest x-ray.  [JS]  Clinical Course User Index [JS] Irean Hong, MD     ____________________________________________   FINAL CLINICAL IMPRESSION(S) / ED DIAGNOSES  Final diagnoses:  Altered mental status, unspecified altered mental status type  Drug overdose, undetermined intent, initial encounter  Sepsis, due to unspecified organism (HCC)  Aspiration pneumonia, unspecified aspiration pneumonia type, unspecified laterality, unspecified part of lung (HCC)  Elevated troponin      NEW MEDICATIONS STARTED DURING THIS VISIT:  Current Discharge Medication List       Note:  This document was prepared using Dragon voice recognition software and may include unintentional dictation errors.    Irean Hong, MD 01/13/17 [phone]

## 2017-01-12 NOTE — ED Provider Notes (Signed)
[facility] Emergency Department Provider Note  ____________________________________________  Time seen: Approximately 4:03 PM  I have reviewed the triage vital signs and the nursing notes.   HISTORY  Chief Complaint Altered Mental Status    HPI Erica Stevens is a 66 y.o. female brought to the ED due to being found unresponsive at home. Patient was noted to have shallow gurgling respirations by EMS, they gave 2 mg of intranasal Narcan, and the patient he came awake and alert and back to normal. Patient denies any complaints whatsoever. No nausea or vomiting. No chest pain abdominal pain or back pain. Denies any drug use or cocaine use. Denies any prescription opioid use.     Past Medical History:  Diagnosis Date  . Allergic rhinitis due to pollen   . Anxiety   . Brachial neuritis or radiculitis NOS   . Cellulitis and abscess of unspecified site   . Chronic kidney disease, stage I   . Chronic pain syndrome   . Congestive heart failure, unspecified   . Coronary atherosclerosis of unspecified type of vessel, native or graft   . Degeneration of cervical intervertebral disc   . Depression   . Hypertension   . Migraine, unspecified, without mention of intractable migraine without mention of status migrainosus   . 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 Osteoporosis, unspecified   . Pure hypercholesterolemia   . Reflux esophagitis   . Thoracic or lumbosacral neuritis or radiculitis, unspecified   . Unspecified urinary incontinence      Patient Active Problem List   Diagnosis Date Noted  . Opiate abuse, episodic 05/03/2016  . Anoxia 05/03/2016  . 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 Accidental heroin overdose 05/03/2016  . Severe recurrent major depression (HCC) 05/01/2016  . Acute respiratory failure (HCC) 04/28/2016  . Angina pectoris (HCC) 02/24/2016  . Cervical disc disease 10/02/2015  . Affective disorder, major 07/02/2015  . 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 Benign hypertensive heart disease 03/09/2015  . Migraine 03/09/2015  . Billowing mitral valve 03/09/2015  . Osteopetrosis 03/09/2015  . Urge incontinence 03/09/2015  . Chest pressure 06/21/2013  . Leg edema 06/21/2013  . Shortness of breath 06/21/2013  . CAD (coronary artery disease) of artery bypass graft 06/21/2013  . Hyperlipidemia 06/21/2013  . Chronic pain 03/25/2009  . Hypercholesterolemia without hypertriglyceridemia 05/22/2007  . Coronary atherosclerosis 05/22/2007  . 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 Brachial neuritis 12/29/2006     Past Surgical History:  Procedure Laterality Date  . ABDOMINAL HYSTERECTOMY    . BLADDER SURGERY    . BYPASS GRAFT    . CARDIAC CATHETERIZATION  2014   [facility]  . CHOLECYSTECTOMY    . COLONOSCOPY    . CORONARY ARTERY BYPASS GRAFT     x3 grafts;[facility]  . fasciotomy       Prior to Admission medications   Medication Sig Start Date End Date Taking? Authorizing Provider  albuterol (PROVENTIL HFA;VENTOLIN HFA) 108 (90 Base) MCG/ACT inhaler Inhale 2 puffs into the lungs every 6 (six) hours as needed for wheezing or shortness of breath. 05/02/16   Alford Highland, MD  amLODipine (NORVASC) 5 MG tablet Take 1 tablet (5 mg total) by mouth daily. 01/01/17   Willy Eddy, MD  aspirin 81 MG tablet Take 81 mg by mouth daily.    Historical Provider, MD  atorvastatin (LIPITOR) 40 MG tablet Take 1 tablet (40 mg total) by mouth daily. 02/24/16   [name], MD  enalapril (VASOTEC) 5 MG tablet Take 1 tablet (5 mg total) by mouth 2 (two) times daily. 02/24/16   [name], MD  furosemide (LASIX) 40 MG tablet Take 0.5-1 tablets (20-40 mg total) by mouth daily. 02/24/16   [name], MD  gabapentin (NEURONTIN) 100 MG capsule Take 1 capsule (100 mg total) by mouth 3 (three) times daily. 05/02/16   Alford Highland, MD  gabapentin (NEURONTIN) 100 MG capsule Take 1-3 capsules (100-300 mg total)  by mouth 3 (three) times daily. 01/01/17   Willy Eddy, MD  isosorbide mononitrate (IMDUR) 30 MG 24 hr tablet TAKE ONE (1) TABLET EACH DAY 02/24/16   [name], MD  lidocaine (LIDODERM) 5 % Place 1 patch onto the skin every 12 (twelve) hours. Remove & Discard patch within 12 hours or as directed by MD 01/01/17 01/01/18  Willy Eddy, MD  metoprolol succinate (TOPROL-XL) 25 MG 24 hr tablet TAKE ONE (1) TABLET EACH DAY 01/01/17   Willy Eddy, MD  mirtazapine (REMERON) 15 MG tablet Take 1 tablet (15 mg total) by mouth at bedtime. 05/02/16   Alford Highland, MD  montelukast (SINGULAIR) 10 MG tablet Take 1 tablet (10 mg total) by mouth at bedtime. 02/25/16   [name], MD  Naloxone HCl 0.4 MG/0.4ML SOAJ Follow package instructions for heroin or opioid overdose. 01/12/17   Sharman Cheek, MD  nicotine (NICODERM CQ - DOSED IN MG/24 HOURS) 21 mg/24hr patch Place 1 patch (21 mg total) onto the skin daily. 05/02/16   Alford Highland, MD  nitroGLYCERIN (NITROLINGUAL) 0.4 MG/SPRAY spray AS DIRECTED 07/06/15   Dennison Mascot, MD  omega-3 acid ethyl esters (LOVAZA) 1 G capsule TAKE TWO CAPSULES BY MOUTH TWICE DAILY. 07/06/15   Dennison Mascot, MD  oxyCODONE (OXY IR/ROXICODONE) 5 MG immediate release tablet Take 1 tablet (5 mg total) by mouth 2 (two) times daily as needed for severe pain. 05/02/16   Alford Highland, MD  Oxycodone HCl 10 MG TABS Take 1 tablet (10 mg total) by mouth 2 (two) times daily. 04/20/16   [name], MD  potassium chloride SA (K-DUR,KLOR-CON) 20 MEQ tablet Take 1 tablet (20 mEq total) by mouth daily. 02/24/16   [name], MD  tiZANidine (ZANAFLEX) 4 MG tablet Take 1 tablet (4 mg total) by mouth 2 (two) times daily. 02/24/16   [name], MD  tolterodine (DETROL) 2 MG tablet Take 1 tablet (2 mg total) by mouth 2 (two) times daily. 02/24/16   [name], MD  traZODone (DESYREL) 50 MG tablet Take 1 tablet (50 mg total) by mouth at bedtime as needed for sleep. 05/02/16    Alford Highland, MD  venlafaxine XR (EFFEXOR XR) 150 MG 24 hr capsule Take 1 capsule (150 mg total) by mouth daily with breakfast. 02/24/16   [name], MD     Allergies Erythromycin; Ivp dye [iodinated diagnostic agents]; Sulfa antibiotics; and Sulfur   Family History  Problem Relation Age of Onset  . Hypertension Mother   . Hyperlipidemia Mother   . Heart disease Mother     Social History Social History  Substance Use Topics  . Smoking status: Current Every Day Smoker    Packs/day: 1.00    Types: Cigarettes  . Smokeless tobacco: Never Used  . Alcohol use No    Review of Systems  Constitutional:   No fever or chills.  ENT:   No sore throat. No rhinorrhea. Lymphatic: No swollen glands, No extremity swelling Endocrine: No hot/cold flashes. No significant weight change. No neck swelling. Cardiovascular:  No chest pain or syncope. Respiratory:   No dyspnea or cough. Gastrointestinal:   Negative for abdominal pain, vomiting and diarrhea.  Genitourinary:   Negative for dysuria or difficulty urinating. Musculoskeletal:   Negative for focal pain or swelling Neurological:   Negative for headaches or weakness. All other systems reviewed and are negative except as documented above in ROS and HPI.  ____________________________________________   PHYSICAL EXAM:  VITAL SIGNS: ED Triage Vitals [01/12/17 1602]  Enc Vitals Group     BP      Pulse      Resp      Temp      Temp src      SpO2      Weight 130 lb (59 kg)     Height 5' (1.524 m)     Head Circumference      Peak Flow      Pain Score      Pain Loc      Pain Edu?      Excl. in GC?     Vital signs reviewed, nursing assessments reviewed.   Constitutional:   Alert and oriented. Well appearing and in no distress. Eyes:   No scleral icterus. No conjunctival pallor. PERRL. EOMI.  No nystagmus. ENT   Head:   Normocephalic and atraumatic.   Nose:   No congestion/rhinnorhea. No septal hematoma    Mouth/Throat:   MMM, no pharyngeal erythema. No peritonsillar mass.    Neck:   No stridor. No SubQ emphysema. No meningismus. Hematological/Lymphatic/Immunilogical:   No cervical lymphadenopathy. Cardiovascular:   RRR. Symmetric bilateral radial and DP pulses.  No murmurs.  Respiratory:   Normal respiratory effort without tachypnea nor retractions. Breath sounds are clear and equal bilaterally. No wheezes/rales/rhonchi. Gastrointestinal:   Soft and nontender. Non distended. There is no CVA tenderness.  No rebound, rigidity, or guarding. Genitourinary:   deferred Musculoskeletal:   Normal range of motion in all extremities. No joint effusions.  No lower extremity tenderness.  No edema. Neurologic:   Normal speech and language.  CN 2-10 normal. Motor grossly intact. No gross focal neurologic deficits are appreciated.  Skin:    Skin is warm, dry and intact. No rash noted.  No petechiae, purpura, or bullae.  ____________________________________________    LABS (pertinent positives/negatives) (all labs ordered are listed, but only abnormal results are displayed) Labs Reviewed  ACETAMINOPHEN LEVEL - Abnormal; Notable for the following:       Result Value   Acetaminophen (Tylenol), Serum <10 (*)    All other components within normal limits  URINALYSIS, COMPLETE (UACMP) WITH MICROSCOPIC - Abnormal; Notable for the following:    Color, Urine YELLOW (*)    APPearance HAZY (*)    Glucose, UA 50 (*)    Protein, ur 30 (*)    Squamous Epithelial / LPF 0-5 (*)    All other components within normal limits  CBC WITH DIFFERENTIAL/PLATELET - Abnormal; Notable for the following:    WBC 14.7 (*)    RDW 15.1 (*)    Neutro Abs 10.3 (*)    All other components within normal limits  COMPREHENSIVE METABOLIC PANEL - Abnormal; Notable for the following:    Potassium 3.4 (*)    Chloride 100 (*)    Glucose, Bld 160 (*)    Calcium 8.7 (*)    AST 54 (*)    GFR calc non Af Amer 59 (*)    All other  components within normal limits  URINE DRUG SCREEN, QUALITATIVE (  [facility] ONLY) - Abnormal; Notable for the following:    Cocaine Metabolite,Ur [place] POSITIVE (*)    Opiate, Ur Screen POSITIVE (*)    All other components within normal limits  URINE CULTURE  ETHANOL  SALICYLATE LEVEL  TROPONIN I  BRAIN NATRIURETIC PEPTIDE  CBC WITH DIFFERENTIAL/PLATELET   ____________________________________________   EKG  Interpreted by me Sinus tachycardia rate 102, normal axis and intervals. Poor R-wave progression in anterior precordial leads. Normal ST segments. Isolated T-wave inversion in lead 3 which is nonspecific. No evidence of right heart strain.  ____________________________________________    RADIOLOGY  Dg Chest Portable 1 View  Result Date: 01/12/2017 CLINICAL DATA:  Unresponsive. EXAM: PORTABLE CHEST 1 VIEW COMPARISON:  01/11/2014 . FINDINGS: Prior CABG. Heart size stable. Low lung volumes. No focal infiltrate. No pleural effusion or pneumothorax. IMPRESSION: 1. Prior CABG.  No evidence of CHF. 2.  Low lung volumes.  No acute pulmonary disease. Electronically Signed   By: Maisie Fushomas  Register   On: 01/12/2017 16:32    ____________________________________________   PROCEDURES Procedures  ____________________________________________   INITIAL IMPRESSION / ASSESSMENT AND PLAN / ED COURSE  Pertinent labs & imaging results that were available during my care of the patient were reviewed by me and considered in my medical decision making (see chart for details).  Patient brought to ED by EMS after being found unresponsive. She responded very well to 2 mg of Narcan intranasal, and is now awake and alert. Still has some confusion and recall impairment which we'll continue to monitor in the ED while getting a workup with labs and chest x-ray. I'll give fluids for her tachycardia of 120. Review of the prescription drug database does not show any opioid prescriptions since August  2017.     Clinical Course as of Jan 13 1931  Thu Jan 12, 2017  1829 UDS + cocaine + opiate. Will monitor in ED to ensure she doesn't have recurrent obtundation as narcan has worn off. If she remains in NAD, she will be stable for outpt f/u.   [PS]  1857 VSS. Will PO trial to prepare for DC home.   [PS]    Clinical Course User Index [PS] Sharman CheekPhillip Takeela Peil, MD     ----------------------------------------- 7:30 PM on 01/12/2017 -----------------------------------------  Patient still feeling fine and denies any complaints. She does admit to sniffing heroin on occasion but says most recent use was about 2 weeks ago. That is not congruent with her current lab results. I advised her that likely she is also sniffing cocaine when she does heroin and to avoid both and follow-up with her doctor. I'll provide her a prescription for naloxone autoinjector to keep at home for safety.  ____________________________________________   FINAL CLINICAL IMPRESSION(S) / ED DIAGNOSES  Final diagnoses:  Polysubstance abuse  Opiate overdose, accidental or unintentional, initial encounter      New Prescriptions   NALOXONE HCL 0.4 MG/0.4ML SOAJ    Follow package instructions for heroin or opioid overdose.     Portions of this note were generated with dragon dictation software. Dictation errors may occur despite best attempts at proofreading.    Sharman CheekPhillip Yareliz Thorstenson, MD 01/12/17 [phone]

## 2017-01-12 NOTE — ED Triage Notes (Signed)
Pt arrived via EMS from home. Pt was found unresponsive and making gurgling sounds. EMS reported VSS. EMS administered Narcan 2mg  IN. Pt became responsive. Pt alert and oriented to self and place on arrival. Pt denies using any narcotic pain medications.

## 2017-01-12 NOTE — ED Notes (Signed)
Upon entering room to d/c patient she was on the phone with a man. Patient kept telling him not to drive up here because he was intoxicated. I explained to patient multiple times we cannot let her leave with someone intoxicated. Told [facility] RN and [employer] in the lobby.

## 2017-01-12 NOTE — ED Notes (Signed)
Patient transported to CT 

## 2017-01-12 NOTE — ED Triage Notes (Signed)
Patient back to ED with brown emesis and episodes of shivering/baring down. Patient was d/c earlier from drug overdose. Patient states she has not taken any drugs since leaving a few hours ago. Patient is disoriented to time. Oriented to self place and situation. Patient has changed mentally since prior discharge.

## 2017-01-13 ENCOUNTER — Encounter: Payer: Self-pay | Admitting: *Deleted

## 2017-01-13 DIAGNOSIS — F419 Anxiety disorder, unspecified: Secondary | ICD-10-CM | POA: Diagnosis present

## 2017-01-13 DIAGNOSIS — R400 Somnolence: Secondary | ICD-10-CM

## 2017-01-13 DIAGNOSIS — J189 Pneumonia, unspecified organism: Secondary | ICD-10-CM | POA: Diagnosis present

## 2017-01-13 DIAGNOSIS — F329 Major depressive disorder, single episode, unspecified: Secondary | ICD-10-CM | POA: Diagnosis present

## 2017-01-13 DIAGNOSIS — F1310 Sedative, hypnotic or anxiolytic abuse, uncomplicated: Secondary | ICD-10-CM

## 2017-01-13 DIAGNOSIS — G894 Chronic pain syndrome: Secondary | ICD-10-CM | POA: Diagnosis present

## 2017-01-13 DIAGNOSIS — T50904D Poisoning by unspecified drugs, medicaments and biological substances, undetermined, subsequent encounter: Secondary | ICD-10-CM

## 2017-01-13 DIAGNOSIS — E7800 Pure hypercholesterolemia, unspecified: Secondary | ICD-10-CM | POA: Diagnosis present

## 2017-01-13 DIAGNOSIS — Z9049 Acquired absence of other specified parts of digestive tract: Secondary | ICD-10-CM | POA: Diagnosis not present

## 2017-01-13 DIAGNOSIS — N181 Chronic kidney disease, stage 1: Secondary | ICD-10-CM | POA: Diagnosis present

## 2017-01-13 DIAGNOSIS — M810 Age-related osteoporosis without current pathological fracture: Secondary | ICD-10-CM | POA: Diagnosis present

## 2017-01-13 DIAGNOSIS — R4182 Altered mental status, unspecified: Secondary | ICD-10-CM | POA: Diagnosis present

## 2017-01-13 DIAGNOSIS — T40602A Poisoning by unspecified narcotics, intentional self-harm, initial encounter: Secondary | ICD-10-CM | POA: Diagnosis present

## 2017-01-13 DIAGNOSIS — G92 Toxic encephalopathy: Secondary | ICD-10-CM | POA: Diagnosis present

## 2017-01-13 DIAGNOSIS — Z882 Allergy status to sulfonamides status: Secondary | ICD-10-CM | POA: Diagnosis not present

## 2017-01-13 DIAGNOSIS — E876 Hypokalemia: Secondary | ICD-10-CM | POA: Diagnosis present

## 2017-01-13 DIAGNOSIS — F17210 Nicotine dependence, cigarettes, uncomplicated: Secondary | ICD-10-CM | POA: Diagnosis present

## 2017-01-13 DIAGNOSIS — I5020 Unspecified systolic (congestive) heart failure: Secondary | ICD-10-CM | POA: Diagnosis present

## 2017-01-13 DIAGNOSIS — I130 Hypertensive heart and chronic kidney disease with heart failure and stage 1 through stage 4 chronic kidney disease, or unspecified chronic kidney disease: Secondary | ICD-10-CM | POA: Diagnosis present

## 2017-01-13 DIAGNOSIS — F1110 Opioid abuse, uncomplicated: Secondary | ICD-10-CM | POA: Diagnosis present

## 2017-01-13 DIAGNOSIS — E785 Hyperlipidemia, unspecified: Secondary | ICD-10-CM | POA: Diagnosis present

## 2017-01-13 DIAGNOSIS — Z951 Presence of aortocoronary bypass graft: Secondary | ICD-10-CM | POA: Diagnosis not present

## 2017-01-13 DIAGNOSIS — I25119 Atherosclerotic heart disease of native coronary artery with unspecified angina pectoris: Secondary | ICD-10-CM | POA: Diagnosis present

## 2017-01-13 DIAGNOSIS — F1410 Cocaine abuse, uncomplicated: Secondary | ICD-10-CM | POA: Diagnosis present

## 2017-01-13 DIAGNOSIS — T40601A Poisoning by unspecified narcotics, accidental (unintentional), initial encounter: Secondary | ICD-10-CM | POA: Diagnosis not present

## 2017-01-13 DIAGNOSIS — Z8249 Family history of ischemic heart disease and other diseases of the circulatory system: Secondary | ICD-10-CM | POA: Diagnosis not present

## 2017-01-13 DIAGNOSIS — J690 Pneumonitis due to inhalation of food and vomit: Secondary | ICD-10-CM | POA: Diagnosis present

## 2017-01-13 DIAGNOSIS — I248 Other forms of acute ischemic heart disease: Secondary | ICD-10-CM | POA: Diagnosis present

## 2017-01-13 DIAGNOSIS — Z90710 Acquired absence of both cervix and uterus: Secondary | ICD-10-CM | POA: Diagnosis not present

## 2017-01-13 LAB — URINE DRUG SCREEN, QUALITATIVE (ARMC ONLY)
Amphetamines, Ur Screen: NOT DETECTED
BARBITURATES, UR SCREEN: NOT DETECTED
Benzodiazepine, Ur Scrn: POSITIVE — AB
COCAINE METABOLITE, UR ~~LOC~~: POSITIVE — AB
Cannabinoid 50 Ng, Ur ~~LOC~~: NOT DETECTED
MDMA (Ecstasy)Ur Screen: NOT DETECTED
METHADONE SCREEN, URINE: NOT DETECTED
OPIATE, UR SCREEN: POSITIVE — AB
Phencyclidine (PCP) Ur S: NOT DETECTED
Tricyclic, Ur Screen: NOT DETECTED

## 2017-01-13 LAB — COMPREHENSIVE METABOLIC PANEL
ALK PHOS: 81 U/L (ref 38–126)
ALT: 23 U/L (ref 14–54)
AST: 47 U/L — ABNORMAL HIGH (ref 15–41)
Albumin: 4.1 g/dL (ref 3.5–5.0)
Anion gap: 9 (ref 5–15)
BILIRUBIN TOTAL: 0.8 mg/dL (ref 0.3–1.2)
BUN: 12 mg/dL (ref 6–20)
CALCIUM: 9.1 mg/dL (ref 8.9–10.3)
CO2: 25 mmol/L (ref 22–32)
CREATININE: 0.79 mg/dL (ref 0.44–1.00)
Chloride: 105 mmol/L (ref 101–111)
Glucose, Bld: 128 mg/dL — ABNORMAL HIGH (ref 65–99)
Potassium: 3.7 mmol/L (ref 3.5–5.1)
SODIUM: 139 mmol/L (ref 135–145)
Total Protein: 8.3 g/dL — ABNORMAL HIGH (ref 6.5–8.1)

## 2017-01-13 LAB — LACTIC ACID, PLASMA: Lactic Acid, Venous: 1.9 mmol/L (ref 0.5–1.9)

## 2017-01-13 LAB — URINALYSIS, COMPLETE (UACMP) WITH MICROSCOPIC
BACTERIA UA: NONE SEEN
BILIRUBIN URINE: NEGATIVE
Glucose, UA: NEGATIVE mg/dL
KETONES UR: NEGATIVE mg/dL
LEUKOCYTES UA: NEGATIVE
Nitrite: NEGATIVE
PROTEIN: NEGATIVE mg/dL
RBC / HPF: NONE SEEN RBC/hpf (ref 0–5)
Specific Gravity, Urine: 1.012 (ref 1.005–1.030)
pH: 6 (ref 5.0–8.0)

## 2017-01-13 LAB — URINE CULTURE

## 2017-01-13 LAB — SALICYLATE LEVEL: Salicylate Lvl: <7 mg/dL (ref 2.8–30.0)

## 2017-01-13 LAB — ETHANOL: Alcohol, Ethyl (B): <5 mg/dL (ref <5)

## 2017-01-13 LAB — GLUCOSE, CAPILLARY: Glucose-Capillary: 117 mg/dL — ABNORMAL HIGH (ref 65–99)

## 2017-01-13 LAB — AMMONIA: Ammonia: 28 umol/L (ref 9–35)

## 2017-01-13 LAB — ACETAMINOPHEN LEVEL: Acetaminophen (Tylenol), Serum: <10 ug/mL — ABNORMAL LOW (ref 10–30)

## 2017-01-13 LAB — TROPONIN I
TROPONIN I: 0.07 ng/mL — AB (ref <0.03)
TROPONIN I: 0.07 ng/mL — AB (ref <0.03)

## 2017-01-13 LAB — MRSA PCR SCREENING: MRSA by PCR: NEGATIVE

## 2017-01-13 MED ORDER — ORAL CARE MOUTH RINSE
15.0000 mL | Freq: Two times a day (BID) | OROMUCOSAL | Status: DC
  Administered 2017-01-13 (×2): 15 mL via OROMUCOSAL

## 2017-01-13 MED ORDER — VENLAFAXINE HCL ER 75 MG PO CP24
150.0000 mg | ORAL_CAPSULE | Freq: Every day | ORAL | Status: DC
  Administered 2017-01-13 – 2017-01-14 (×2): 150 mg via ORAL
  Filled 2017-01-13 (×2): qty 2

## 2017-01-13 MED ORDER — LIDOCAINE 5 % EX PTCH
1.0000 | MEDICATED_PATCH | CUTANEOUS | Status: DC
  Administered 2017-01-14: 1 via TRANSDERMAL
  Filled 2017-01-13: qty 1

## 2017-01-13 MED ORDER — OXYCODONE HCL 5 MG PO TABS
5.0000 mg | ORAL_TABLET | Freq: Two times a day (BID) | ORAL | Status: DC | PRN
  Administered 2017-01-13 – 2017-01-14 (×3): 5 mg via ORAL
  Filled 2017-01-13 (×3): qty 1

## 2017-01-13 MED ORDER — SODIUM CHLORIDE 0.9 % IV BOLUS (SEPSIS): 1000.0000 mL | Freq: Once | INTRAVENOUS | Status: DC

## 2017-01-13 MED ORDER — OMEGA-3-ACID ETHYL ESTERS 1 G PO CAPS
2.0000 | ORAL_CAPSULE | Freq: Two times a day (BID) | ORAL | Status: DC
  Administered 2017-01-13 – 2017-01-14 (×4): 2 g via ORAL
  Filled 2017-01-13 (×4): qty 2

## 2017-01-13 MED ORDER — AMLODIPINE BESYLATE 5 MG PO TABS
5.0000 mg | ORAL_TABLET | Freq: Every day | ORAL | Status: DC
  Administered 2017-01-13 – 2017-01-14 (×2): 5 mg via ORAL
  Filled 2017-01-13 (×2): qty 1

## 2017-01-13 MED ORDER — CLINDAMYCIN PHOSPHATE 600 MG/50ML IV SOLN
600.0000 mg | Freq: Once | INTRAVENOUS | Status: AC
  Administered 2017-01-13: 600 mg via INTRAVENOUS
  Filled 2017-01-13: qty 50

## 2017-01-13 MED ORDER — FAMOTIDINE 20 MG PO TABS
20.0000 mg | ORAL_TABLET | Freq: Two times a day (BID) | ORAL | Status: DC
  Administered 2017-01-13 – 2017-01-14 (×2): 20 mg via ORAL
  Filled 2017-01-13 (×2): qty 1

## 2017-01-13 MED ORDER — LIDOCAINE 5 % EX PTCH
1.0000 | MEDICATED_PATCH | Freq: Two times a day (BID) | CUTANEOUS | Status: DC
  Administered 2017-01-13: 1 via TRANSDERMAL
  Filled 2017-01-13 (×2): qty 1

## 2017-01-13 MED ORDER — MIRTAZAPINE 15 MG PO TABS
15.0000 mg | ORAL_TABLET | Freq: Every day | ORAL | Status: DC
  Administered 2017-01-13: 15 mg via ORAL
  Filled 2017-01-13: qty 1

## 2017-01-13 MED ORDER — FUROSEMIDE 40 MG PO TABS
20.0000 mg | ORAL_TABLET | Freq: Every day | ORAL | Status: DC
  Filled 2017-01-13: qty 2

## 2017-01-13 MED ORDER — METOPROLOL SUCCINATE ER 25 MG PO TB24
25.0000 mg | ORAL_TABLET | Freq: Every day | ORAL | Status: DC
  Administered 2017-01-13 – 2017-01-14 (×2): 25 mg via ORAL
  Filled 2017-01-13 (×2): qty 1

## 2017-01-13 MED ORDER — ENOXAPARIN SODIUM 40 MG/0.4ML ~~LOC~~ SOLN
40.0000 mg | SUBCUTANEOUS | Status: DC
  Administered 2017-01-13: 21:00:00 40 mg via SUBCUTANEOUS
  Filled 2017-01-13: qty 0.4

## 2017-01-13 MED ORDER — ISOSORBIDE MONONITRATE ER 30 MG PO TB24
30.0000 mg | ORAL_TABLET | Freq: Every day | ORAL | Status: DC
  Administered 2017-01-13 – 2017-01-14 (×2): 30 mg via ORAL
  Filled 2017-01-13 (×2): qty 1

## 2017-01-13 MED ORDER — TRAZODONE HCL 50 MG PO TABS: 50.0000 mg | ORAL_TABLET | Freq: Every evening | ORAL | Status: DC | PRN

## 2017-01-13 MED ORDER — ENALAPRIL MALEATE 5 MG PO TABS
5.0000 mg | ORAL_TABLET | Freq: Two times a day (BID) | ORAL | Status: DC
  Administered 2017-01-13 – 2017-01-14 (×4): 5 mg via ORAL
  Filled 2017-01-13 (×2): qty 1
  Filled 2017-01-13: qty 2
  Filled 2017-01-13: qty 1

## 2017-01-13 MED ORDER — FAMOTIDINE 20 MG PO TABS: 20.0000 mg | ORAL_TABLET | Freq: Two times a day (BID) | ORAL | Status: DC

## 2017-01-13 MED ORDER — MONTELUKAST SODIUM 10 MG PO TABS: 10.0000 mg | ORAL_TABLET | Freq: Every day | ORAL | Status: DC

## 2017-01-13 MED ORDER — CEFTRIAXONE SODIUM IN DEXTROSE 20 MG/ML IV SOLN: 1.0000 g | INTRAVENOUS | Status: DC

## 2017-01-13 MED ORDER — POTASSIUM CHLORIDE CRYS ER 20 MEQ PO TBCR
20.0000 meq | EXTENDED_RELEASE_TABLET | Freq: Every day | ORAL | Status: DC
  Administered 2017-01-13: 20 meq via ORAL
  Filled 2017-01-13 (×2): qty 1

## 2017-01-13 MED ORDER — OXYBUTYNIN CHLORIDE ER 10 MG PO TB24
10.0000 mg | ORAL_TABLET | Freq: Every day | ORAL | Status: DC
  Administered 2017-01-13: 21:00:00 10 mg via ORAL
  Filled 2017-01-13 (×2): qty 1

## 2017-01-13 MED ORDER — ATORVASTATIN CALCIUM 20 MG PO TABS
40.0000 mg | ORAL_TABLET | Freq: Every day | ORAL | Status: DC
  Administered 2017-01-13 – 2017-01-14 (×2): 40 mg via ORAL
  Filled 2017-01-13 (×2): qty 2

## 2017-01-13 MED ORDER — TIZANIDINE HCL 4 MG PO TABS: 4.0000 mg | ORAL_TABLET | Freq: Two times a day (BID) | ORAL | Status: DC | PRN

## 2017-01-13 MED ORDER — NICOTINE 21 MG/24HR TD PT24
21.0000 mg | MEDICATED_PATCH | Freq: Every day | TRANSDERMAL | Status: DC
  Administered 2017-01-13 – 2017-01-14 (×2): 21 mg via TRANSDERMAL
  Filled 2017-01-13 (×2): qty 1

## 2017-01-13 MED ORDER — AZITHROMYCIN 500 MG IV SOLR
500.0000 mg | INTRAVENOUS | Status: DC
  Administered 2017-01-13: 500 mg via INTRAVENOUS
  Filled 2017-01-13 (×2): qty 500

## 2017-01-13 MED ORDER — DEXTROSE 5 % IV SOLN
1.0000 g | INTRAVENOUS | Status: DC
  Administered 2017-01-13: 1 g via INTRAVENOUS
  Filled 2017-01-13: qty 10

## 2017-01-13 MED ORDER — ASPIRIN 81 MG PO CHEW
81.0000 mg | CHEWABLE_TABLET | Freq: Every day | ORAL | Status: DC
  Administered 2017-01-13 – 2017-01-14 (×2): 81 mg via ORAL
  Filled 2017-01-13 (×2): qty 1

## 2017-01-13 MED ORDER — OXYCODONE HCL 5 MG PO TABS: 10.0000 mg | ORAL_TABLET | Freq: Two times a day (BID) | ORAL | Status: DC | PRN

## 2017-01-13 NOTE — Plan of Care (Signed)
Problem: Education: Goal: Knowledge of [place] [facility] Education information/materials will improve Outcome: Progressing Pt educated on admission

## 2017-01-13 NOTE — Progress Notes (Signed)
Name: Erica Stevens MRN: [id number] DOB: 1951-06-26    ADMISSION DATE:  01/12/2017  BRIEF PATIENT DESCRIPTION: 66 year old female with intentional drug overdose  SIGNIFICANT EVENTS  5/3 Patient presented to ED with intentional drug overdose and was discharged from the ED 5/4 Patient was brought back with drug overdose  5/4 Admitted to the ICU for closer monitoring   STUDIES:  04/30/16 ECHO>>The cavity size was normal. Systolic function was normal. The estimated ejection fraction was 65% 01/12/17 CT HEAD>>Negative for hemorrhage or intracranial mass 01/13/17 UDS >> Positive for benzo,cocaine and opiate  HISTORY OF PRESENT ILLNESS:  Erica Stevens is a 66 year old female with known history of Anxiety,Chronic Pain Syndrome,Depression,CHF,CKD,CABG(2014), HTN and migraine. Patient came in on 5/3 to ED with drug overdose.  Her UDS was positive for opiates and cocaine.  She was discharged from the ED.  However she was brought back to the ED disoriented, more somnolent.  There were 6 bottles of trazodone 100mg  were found in her purse.  All the bottles were empty with the exception of one pill remaining.  Patient denies any intentional use of any type of drugs.  Patient's UDS was positive for opiates, benzos and cocaine the second time.  Patient was admitted by the hospitalist and was sent to the ICU for closer monitoring.  PAST MEDICAL HISTORY :   has a past medical history of Allergic rhinitis due to pollen; Anxiety; Brachial neuritis or radiculitis NOS; Cellulitis and abscess of unspecified site; Chronic kidney disease, stage I; Chronic pain syndrome; Congestive heart failure, unspecified; Coronary atherosclerosis of unspecified type of vessel, native or graft; Degeneration of cervical intervertebral disc; Depression; Hypertension; Migraine, unspecified, without mention of intractable migraine without mention of status migrainosus; Mitral valve disorders(424.0); Osteoporosis, unspecified; Pure  hypercholesterolemia; Reflux esophagitis; Thoracic or lumbosacral neuritis or radiculitis, unspecified; and Unspecified urinary incontinence.  has a past surgical history that includes fasciotomy; Bladder surgery; Cholecystectomy; Abdominal hysterectomy; Colonoscopy; Coronary artery bypass graft; Cardiac catheterization (2014); and Bypass Graft. Prior to Admission medications   Medication Sig Start Date End Date Taking? Authorizing Provider  albuterol (PROVENTIL HFA;VENTOLIN HFA) 108 (90 Base) MCG/ACT inhaler Inhale 2 puffs into the lungs every 6 (six) hours as needed for wheezing or shortness of breath. 05/02/16   Alford Highlandichard Wieting, MD  amLODipine (NORVASC) 5 MG tablet Take 1 tablet (5 mg total) by mouth daily. 01/01/17   Willy EddyPatrick Robinson, MD  aspirin 81 MG tablet Take 81 mg by mouth daily.    Historical Provider, MD  atorvastatin (LIPITOR) 40 MG tablet Take 1 tablet (40 mg total) by mouth daily. 02/24/16   Alba CoryKrichna Sowles, MD  enalapril (VASOTEC) 5 MG tablet Take 1 tablet (5 mg total) by mouth 2 (two) times daily. 02/24/16   Alba CoryKrichna Sowles, MD  furosemide (LASIX) 40 MG tablet Take 0.5-1 tablets (20-40 mg total) by mouth daily. 02/24/16   Alba CoryKrichna Sowles, MD  gabapentin (NEURONTIN) 100 MG capsule Take 1 capsule (100 mg total) by mouth 3 (three) times daily. 05/02/16   Alford Highlandichard Wieting, MD  gabapentin (NEURONTIN) 100 MG capsule Take 1-3 capsules (100-300 mg total) by mouth 3 (three) times daily. 01/01/17   Willy EddyPatrick Robinson, MD  isosorbide mononitrate (IMDUR) 30 MG 24 hr tablet TAKE ONE (1) TABLET EACH DAY 02/24/16   Alba CoryKrichna Sowles, MD  lidocaine (LIDODERM) 5 % Place 1 patch onto the skin every 12 (twelve) hours. Remove & Discard patch within 12 hours or as directed by MD 01/01/17 01/01/18  Willy EddyPatrick Robinson,  MD  metoprolol succinate (TOPROL-XL) 25 MG 24 hr tablet TAKE ONE (1) TABLET EACH DAY 01/01/17   Willy Eddy, MD  mirtazapine (REMERON) 15 MG tablet Take 1 tablet (15 mg total) by mouth at bedtime. 05/02/16    Alford Highland, MD  montelukast (SINGULAIR) 10 MG tablet Take 1 tablet (10 mg total) by mouth at bedtime. 02/25/16   [name], MD  Naloxone HCl 0.4 MG/0.4ML SOAJ Follow package instructions for heroin or opioid overdose. 01/12/17   Sharman Cheek, MD  nicotine (NICODERM CQ - DOSED IN MG/24 HOURS) 21 mg/24hr patch Place 1 patch (21 mg total) onto the skin daily. 05/02/16   Alford Highland, MD  nitroGLYCERIN (NITROLINGUAL) 0.4 MG/SPRAY spray AS DIRECTED 07/06/15   Dennison Mascot, MD  omega-3 acid ethyl esters (LOVAZA) 1 G capsule TAKE TWO CAPSULES BY MOUTH TWICE DAILY. 07/06/15   Dennison Mascot, MD  oxyCODONE (OXY IR/ROXICODONE) 5 MG immediate release tablet Take 1 tablet (5 mg total) by mouth 2 (two) times daily as needed for severe pain. 05/02/16   Alford Highland, MD  Oxycodone HCl 10 MG TABS Take 1 tablet (10 mg total) by mouth 2 (two) times daily. 04/20/16   [name], MD  potassium chloride SA (K-DUR,KLOR-CON) 20 MEQ tablet Take 1 tablet (20 mEq total) by mouth daily. 02/24/16   [name], MD  tiZANidine (ZANAFLEX) 4 MG tablet Take 1 tablet (4 mg total) by mouth 2 (two) times daily. 02/24/16   [name], MD  tolterodine (DETROL) 2 MG tablet Take 1 tablet (2 mg total) by mouth 2 (two) times daily. 02/24/16   [name], MD  traZODone (DESYREL) 50 MG tablet Take 1 tablet (50 mg total) by mouth at bedtime as needed for sleep. 05/02/16   Alford Highland, MD  venlafaxine XR (EFFEXOR XR) 150 MG 24 hr capsule Take 1 capsule (150 mg total) by mouth daily with breakfast. 02/24/16   [name], MD   Allergies  Allergen Reactions  . Erythromycin   . Ivp Dye [Iodinated Diagnostic Agents]   . Sulfa Antibiotics     Stated turns orange  . Sulfur     FAMILY HISTORY:  family history includes Heart disease in her mother; Hyperlipidemia in her mother; Hypertension in her mother. SOCIAL HISTORY:  reports that she has been smoking Cigarettes.  She has been smoking about 1.00 pack per  day. She has never used smokeless tobacco. She reports that she does not drink alcohol or use drugs.  REVIEW OF SYSTEMS:   Constitutional: Negative for fever, chills, weight loss, malaise/fatigue and diaphoresis.  HENT: Negative for hearing loss, ear pain, nosebleeds, congestion, sore throat, neck pain, tinnitus and ear discharge.   Eyes: Negative for blurred vision, double vision, photophobia, pain, discharge and redness.  Respiratory: Negative for cough, hemoptysis, sputum production, shortness of breath, wheezing and stridor.   Cardiovascular: Negative for chest pain, palpitations, orthopnea, claudication, leg swelling and PND.  Gastrointestinal: Negative for heartburn, nausea, vomiting, abdominal pain, diarrhea, constipation, blood in stool and melena.  Genitourinary: Negative for dysuria, urgency, frequency, hematuria and flank pain.  Musculoskeletal: Negative for myalgias, back pain, joint pain and falls.  Skin: Negative for itching and rash.  Neurological: Negative for dizziness, tingling, tremors, sensory change, speech change, focal weakness, seizures, loss of consciousness, weakness and headaches.  Endo/Heme/Allergies: Negative for environmental allergies and polydipsia. Does not bruise/bleed easily.  SUBJECTIVE: Patient denies any use of drugs .  She states that "she doesn't know why she is here"  VITAL SIGNS:  Temp:  [98.1 F (36.7 C)-101.2 F (38.4 C)] 99.1 F (37.3 C) (05/04 0228) Pulse Rate:  [72-102] 83 (05/04 0200) Resp:  [12-19] 13 (05/04 0200) BP: (109-199)/(63-122) 173/71 (05/04 0200) SpO2:  [90 %-99 %] 94 % (05/04 0200) Weight:  [130 lb (59 kg)] 130 lb (59 kg) (05/03 2247)  PHYSICAL EXAMINATION: General: 66 year old female in no acute distress Neuro:  Awake, Alert and oriented HEENT: AT,[place],No JVD Cardiovascular:  S1S2 regular,no m/r/g noted Lungs:  Clear bilaterally , no wheezes, crackles, rhonchi noted Abdomen:  Soft, NT,ND Musculoskeletal:  No edema, cyanosis  noted, active ROM Skin:  Warm, dry and intact   Recent Labs Lab 01/12/17 1630 01/12/17 2252  NA 138 139  K 3.4* 3.7  CL 100* 105  CO2 30 25  BUN 11 12  CREATININE 0.98 0.79  GLUCOSE 160* 128*    Recent Labs Lab 01/12/17 1602 01/12/17 2252  HGB 14.0 13.9  HCT 40.7 40.2  WBC 14.7* 21.4*  PLT 351 271   Ct Head Wo Contrast  Result Date: 01/13/2017 CLINICAL DATA:  Brown emesis drug overdose EXAM: CT HEAD WITHOUT CONTRAST TECHNIQUE: Contiguous axial images were obtained from the base of the skull through the vertex without intravenous contrast. COMPARISON:  05/22/2012 FINDINGS: Brain: No territorial infarction, hemorrhage or intracranial mass. Mild to moderate atrophy. Slight ventricular enlargement since the prior study. Vascular: No hyperdense vessels.  Carotid artery calcifications. Skull: No fracture or suspicious bone lesion Sinuses/Orbits: Mild mucosal thickening in the ethmoid sinuses. No acute orbital abnormality Other: None IMPRESSION: 1. Negative for hemorrhage or intracranial mass. 2. Mild to moderate atrophy. Mildly enlarged ventricles, slightly increased compared to the 2013 comparison study. Electronically Signed   By: Jasmine Pang M.D.   On: 01/13/2017 00:15   Dg Chest Port 1 View  Result Date: 01/12/2017 CLINICAL DATA:  Returned to the emergency department with emesis and altered mental status. Seen earlier today. EXAM: PORTABLE CHEST 1 VIEW COMPARISON:  01/12/2017 FINDINGS: Shallow inspiration. No consolidation. No effusion. No pneumothorax. Normal vasculature. Unchanged mild cardiomegaly IMPRESSION: Stable mild cardiomegaly. No consolidation or effusion. Normal vasculature. Electronically Signed   By: Ellery Plunk M.D.   On: 01/12/2017 23:36   Dg Chest Portable 1 View  Result Date: 01/12/2017 CLINICAL DATA:  Unresponsive. EXAM: PORTABLE CHEST 1 VIEW COMPARISON:  01/11/2014 . FINDINGS: Prior CABG. Heart size stable. Low lung volumes. No focal infiltrate. No pleural  effusion or pneumothorax. IMPRESSION: 1. Prior CABG.  No evidence of CHF. 2.  Low lung volumes.  No acute pulmonary disease. Electronically Signed   By: Maisie Fus  Register   On: 01/12/2017 16:32    ASSESSMENT / PLAN:  Intentional  Drug overdose Elevated troponin ?Aspiration unlikely Leukocytosis Hx of HTN Hx of depression, chronic pain syndrome and anxiety Tobacco abuse   Plan Pschy consulted Patient IVC Continue amlodipine/vasotec/imdur Continue venlafaxine/zanaflex Continue Oxycodone Nicotine Patch Follow cultures/cbc Trend Troponin Antibiotics is not needed  Acetaminophen level <10 Rest per primary  Bincy Varughese,AG-ACNP Pulmonary and Critical Care Medicine [facility]   01/13/2017, 3:17 AM  PCCM ATTENDING ATTESTATION:  I have evaluated patient with the APP Varughese, reviewed database in its entirety and discussed care plan in detail. In addition, this patient was discussed on multidisciplinary rounds.   Important exam findings: Mildly lethargic, Conversant, avoidant, denies intentional overdose NAD HEENT WNL No JVD Chest clear Brady, regular, + systolic M NABS, soft No edema, ext warm  CXR: no infiltrates or edema, low volumes  Major problems addressed  by PCCM team: Presumed drug OD - polysubstance AMS - resolving No evidence of PNA or sepsis  PLAN/REC: Transfer to med-surg ordered Psychiatry eval ordered DC antibiotics After transfer, PCCM will sign off. Please call if we can be of further assistance   Billy Fischer, MD PCCM service Mobile [phone] Pager [phone]  01/13/2017 9:57 AM

## 2017-01-13 NOTE — [facility] Counselor (Signed)
Unable to complete TTS consult at this time due to patient's AMS.  Pt is being admitted to the medical floor.

## 2017-01-13 NOTE — Consult Note (Signed)
[facility] Face-to-Face Psychiatry Consult   Reason for Consult:  Consult for 66 year old woman who came into the hospital with altered mental status related to overdose Referring Physician:  Mody Patient Identification: Erica Stevens MRN:  [id number] Principal Diagnosis: Opiate overdose Diagnosis:   Patient Active Problem List   Diagnosis Date Noted  . Aspiration pneumonia ([place]) [J69.0] 01/13/2017  . Opiate overdose [T40.601A] 01/13/2017  . Cocaine abuse [F14.10] 01/13/2017  . Benzodiazepine abuse [F13.10] 01/13/2017  . Heroin abuse [F11.10] 05/03/2016  . Anoxia [R09.02] 05/03/2016  . 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 Accidental heroin overdose [T40.1X1A] 05/03/2016  . Severe recurrent major depression ([place]) [F33.2] 05/01/2016  . Acute respiratory failure ([place]) [J96.00] 04/28/2016  . Angina pectoris ([place]) [I20.9] 02/24/2016  . Cervical disc disease [M50.90] 10/02/2015  . Affective disorder, major [F32.9] 07/02/2015  . 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 Benign hypertensive heart disease [I11.9] 03/09/2015  . Migraine [G43.909] 03/09/2015  . Billowing mitral valve [I34.1] 03/09/2015  . 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 Urge incontinence [N39.41] 03/09/2015  . Chest pressure [R07.89] 06/21/2013  . Leg edema [R60.0] 06/21/2013  . Shortness of breath [R06.02] 06/21/2013  . CAD (coronary artery disease) of artery bypass graft [I25.810] 06/21/2013  . Hyperlipidemia [E78.5] 06/21/2013  . Chronic pain [G89.29] 03/25/2009  . Hypercholesterolemia without hypertriglyceridemia [E78.00] 05/22/2007  . Coronary atherosclerosis [I25.10] 05/22/2007  . Lumbosacral neuritis [M54.17] 12/29/2006  . 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 female patient admitted with "it was pure stupidity".  HPI:  Patient interviewed. Chart reviewed. 66 year old  woman known from previous encounters with a history of substance abuse. Evidently she came into the hospital initially unconscious probably from an overdose but was quickly revived and discharged. She collapsed again it seems before even leaving the premises probably from the pneumonia she developed. Patient was put under commitment. Patient tells me that her problem is "pure stupidity" by which she means her own poor judgment in having snorted heroin. She says that she's been feeling a little lonely for the last couple weeks since her son and her son's girlfriend moved out of her home. When some friends came by with some heroin and offered it to her she chose to get high with them. Patient denies that she's been using any other drugs although her drug screen is positive for cocaine and benzodiazepines as well. Denies alcohol use. Reports that her mood has generally been okay. A little bit down since her son moved out. Sleep is okay most of the time. No changed appetite. Still has things in her life she enjoys. Denies suicidal ideation. Denies any psychosis.  Social history: Patient lives on her own. Has some friends she spends time with although a lot of them seem to also do drugs.  Medical history: Patient has a history of multiple medical problems a lot of which probably have something to do with her history of drug abuse. Currently has a pneumonia.  Substance abuse history: Long and established history of abuse of multiple drugs including heroin. Has had similar accidental overdoses in the past. No history of suicidality violence or other psychiatric problems.  Past Psychiatric History: No history of mental health issues outside of the substance abuse issues. No history of suicide attempts or violence.  Risk to Self: Is patient at risk for suicide?: No Risk to Others:   Prior Inpatient  Therapy:   Prior Outpatient Therapy:    Past Medical History:  Past Medical History:  Diagnosis Date  .  Allergic rhinitis due to pollen   . Anxiety   . Brachial neuritis or radiculitis NOS   . Cellulitis and abscess of unspecified site   . Chronic kidney disease, stage I   . Chronic pain syndrome   . Congestive heart failure, unspecified   . Coronary atherosclerosis of unspecified type of vessel, native or graft   . Degeneration of cervical intervertebral disc   . Depression   . Hypertension   . Migraine, unspecified, without mention of intractable migraine without mention of status migrainosus   . 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 Osteoporosis, unspecified   . Pure hypercholesterolemia   . Reflux esophagitis   . Thoracic or lumbosacral neuritis or radiculitis, unspecified   . Unspecified urinary incontinence     Past Surgical History:  Procedure Laterality Date  . ABDOMINAL HYSTERECTOMY    . BLADDER SURGERY    . BYPASS GRAFT    . CARDIAC CATHETERIZATION  2014   [facility]  . CHOLECYSTECTOMY    . COLONOSCOPY    . CORONARY ARTERY BYPASS GRAFT     x3 grafts;[facility]  . fasciotomy     Family History:  Family History  Problem Relation Age of Onset  . Hypertension Mother   . Hyperlipidemia Mother   . Heart disease Mother    Family Psychiatric  History: Positive for substance abuse Social History:  History  Alcohol Use No     History  Drug Use No    Social History   Social History  . Marital status: Single    Spouse name: N/A  . Number of children: N/A  . Years of education: N/A   Social History Main Topics  . Smoking status: Current Every Day Smoker    Packs/day: 1.00    Types: Cigarettes  . Smokeless tobacco: Never Used  . Alcohol use No  . Drug use: No  . Sexual activity: Not Currently   Other Topics Concern  . None   Social History Narrative   ** Merged History Encounter **       Additional Social History:    Allergies:   Allergies  Allergen Reactions  . Erythromycin   . Ivp Dye [Iodinated Diagnostic Agents]   . Sulfa Antibiotics     Stated turns  orange  . Sulfur     Labs:  Results for orders placed or performed during the hospital encounter of 01/12/17 (from the past 48 hour(s))  CBC with Differential     Status: Abnormal   Collection Time: 01/12/17 10:52 PM  Result Value Ref Range   WBC 21.4 (H) 3.6 - 11.0 K/uL   RBC 4.39 3.80 - 5.20 MIL/uL   Hemoglobin 13.9 12.0 - 16.0 g/dL   HCT 40.2 35.0 - 47.0 %   MCV 91.4 80.0 - 100.0 fL   MCH 31.5 26.0 - 34.0 pg   MCHC 34.5 32.0 - 36.0 g/dL   RDW 14.7 (H) 11.5 - 14.5 %   Platelets 271 150 - 440 K/uL   Neutrophils Relative % 90 %   Neutro Abs 19.2 (H) 1.4 - 6.5 K/uL   Lymphocytes Relative 5 %   Lymphs Abs 1.0 1.0 - 3.6 K/uL   Monocytes Relative 5 %   Monocytes Absolute 1.1 (H) 0.2 - 0.9 K/uL   Eosinophils Relative 0 %   Eosinophils Absolute 0.1 0 - 0.7 K/uL   Basophils  Relative 0 %   Basophils Absolute 0.0 0 - 0.1 K/uL  Comprehensive metabolic panel     Status: Abnormal   Collection Time: 01/12/17 10:52 PM  Result Value Ref Range   Sodium 139 135 - 145 mmol/L   Potassium 3.7 3.5 - 5.1 mmol/L    Comment: HEMOLYSIS AT THIS LEVEL MAY AFFECT RESULT   Chloride 105 101 - 111 mmol/L   CO2 25 22 - 32 mmol/L   Glucose, Bld 128 (H) 65 - 99 mg/dL   BUN 12 6 - 20 mg/dL   Creatinine, Ser 0.79 0.44 - 1.00 mg/dL   Calcium 9.1 8.9 - 10.3 mg/dL   Total Protein 8.3 (H) 6.5 - 8.1 g/dL   Albumin 4.1 3.5 - 5.0 g/dL   AST 47 (H) 15 - 41 U/L   ALT 23 14 - 54 U/L   Alkaline Phosphatase 81 38 - 126 U/L   Total Bilirubin 0.8 0.3 - 1.2 mg/dL   GFR calc non Af Amer >60 >60 mL/min   GFR calc Af Amer >60 >60 mL/min    Comment: (NOTE) The eGFR has been calculated using the CKD EPI equation. This calculation has not been validated in all clinical situations. eGFR's persistently <60 mL/min signify possible Chronic Kidney Disease.    Anion gap 9 5 - 15  Acetaminophen level     Status: Abnormal   Collection Time: 01/12/17 10:52 PM  Result Value Ref Range   Acetaminophen (Tylenol), Serum <10 (L)  10 - 30 ug/mL    Comment:        THERAPEUTIC CONCENTRATIONS VARY SIGNIFICANTLY. A RANGE OF 10-30 ug/mL MAY BE AN EFFECTIVE CONCENTRATION FOR MANY PATIENTS. HOWEVER, SOME ARE BEST TREATED AT CONCENTRATIONS OUTSIDE THIS RANGE. ACETAMINOPHEN CONCENTRATIONS >150 ug/mL AT 4 HOURS AFTER INGESTION AND >50 ug/mL AT 12 HOURS AFTER INGESTION ARE OFTEN ASSOCIATED WITH TOXIC REACTIONS.   Troponin I     Status: Abnormal   Collection Time: 01/12/17 10:52 PM  Result Value Ref Range   Troponin I 0.07 (HH) <0.03 ng/mL    Comment: CRITICAL RESULT CALLED TO, READ BACK BY AND VERIFIED WITH KATELYN KIZZIAH ON 01/13/17 AT 1093 MNS   Salicylate level     Status: None   Collection Time: 01/12/17 10:52 PM  Result Value Ref Range   Salicylate Lvl <2.3 2.8 - 30.0 mg/dL  Ethanol     Status: None   Collection Time: 01/12/17 10:52 PM  Result Value Ref Range   Alcohol, Ethyl (B) <5 <5 mg/dL    Comment:        LOWEST DETECTABLE LIMIT FOR SERUM ALCOHOL IS 5 mg/dL FOR MEDICAL PURPOSES ONLY   Lactic acid, plasma     Status: None   Collection Time: 01/12/17 11:20 PM  Result Value Ref Range   Lactic Acid, Venous 1.9 0.5 - 1.9 mmol/L  Ammonia     Status: None   Collection Time: 01/12/17 11:20 PM  Result Value Ref Range   Ammonia 28 9 - 35 umol/L  Glucose, capillary     Status: Abnormal   Collection Time: 01/12/17 11:20 PM  Result Value Ref Range   Glucose-Capillary 133 (H) 65 - 99 mg/dL  Urine Drug Screen, Qualitative     Status: Abnormal   Collection Time: 01/13/17 12:37 AM  Result Value Ref Range   Tricyclic, Ur Screen NONE DETECTED NONE DETECTED   Amphetamines, Ur Screen NONE DETECTED NONE DETECTED   MDMA (Ecstasy)Ur Screen NONE DETECTED NONE DETECTED   Cocaine Metabolite,Ur  [place] POSITIVE (A) NONE DETECTED   Opiate, Ur Screen POSITIVE (A) NONE DETECTED   Phencyclidine (PCP) Ur S NONE DETECTED NONE DETECTED   Cannabinoid 50 Ng, Ur [place] NONE DETECTED NONE DETECTED   Barbiturates, Ur Screen NONE  DETECTED NONE DETECTED   Benzodiazepine, Ur Scrn POSITIVE (A) NONE DETECTED   Methadone Scn, Ur NONE DETECTED NONE DETECTED    Comment: (NOTE) 100  Tricyclics, urine               Cutoff 1000 ng/mL 200  Amphetamines, urine             Cutoff 1000 ng/mL 300  MDMA (Ecstasy), urine           Cutoff 500 ng/mL 400  Cocaine Metabolite, urine       Cutoff 300 ng/mL 500  Opiate, urine                   Cutoff 300 ng/mL 600  Phencyclidine (PCP), urine      Cutoff 25 ng/mL 700  Cannabinoid, urine              Cutoff 50 ng/mL 800  Barbiturates, urine             Cutoff 200 ng/mL 900  Benzodiazepine, urine           Cutoff 200 ng/mL 1000 Methadone, urine                Cutoff 300 ng/mL 1100 1200 The urine drug screen provides only a preliminary, unconfirmed 1300 analytical test result and should not be used for non-medical 1400 purposes. Clinical consideration and professional judgment should 1500 be applied to any positive drug screen result due to possible 1600 interfering substances. A more specific alternate chemical method 1700 must be used in order to obtain a confirmed analytical result.  1800 Gas chromato graphy / mass spectrometry (GC/MS) is the preferred 1900 confirmatory method.   Urinalysis, Complete w Microscopic     Status: Abnormal   Collection Time: 01/13/17 12:37 AM  Result Value Ref Range   Color, Urine YELLOW (A) YELLOW   APPearance HAZY (A) CLEAR   Specific Gravity, Urine 1.012 1.005 - 1.030   pH 6.0 5.0 - 8.0   Glucose, UA NEGATIVE NEGATIVE mg/dL   Hgb urine dipstick SMALL (A) NEGATIVE   Bilirubin Urine NEGATIVE NEGATIVE   Ketones, ur NEGATIVE NEGATIVE mg/dL   Protein, ur NEGATIVE NEGATIVE mg/dL   Nitrite NEGATIVE NEGATIVE   Leukocytes, UA NEGATIVE NEGATIVE   RBC / HPF NONE SEEN 0 - 5 RBC/hpf   WBC, UA 0-5 0 - 5 WBC/hpf   Bacteria, UA NONE SEEN NONE SEEN   Squamous Epithelial / LPF 0-5 (A) NONE SEEN   Mucous PRESENT    Amorphous Crystal PRESENT   Blood gas,  arterial     Status: Abnormal   Collection Time: 01/13/17 12:37 AM  Result Value Ref Range   FIO2 0.21    pH, Arterial 7.41 7.350 - 7.450   pCO2 arterial 47 32.0 - 48.0 mmHg   pO2, Arterial 57 (L) 83.0 - 108.0 mmHg   Bicarbonate 29.8 (H) 20.0 - 28.0 mmol/L   Acid-Base Excess 4.3 (H) 0.0 - 2.0 mmol/L   O2 Saturation 89.5 %   Patient temperature 37.0    Collection site RIGHT RADIAL    Sample type ARTERIAL DRAW    Allens test (pass/fail) PASS PASS   Mechanical Rate PENDING   Culture, blood (routine x 2)  Status: None (Preliminary result)   Collection Time: 01/13/17 12:37 AM  Result Value Ref Range   Specimen Description BLOOD L HAND    Special Requests Blood Culture adequate volume    Culture NO GROWTH < 24 HOURS    Report Status PENDING   Culture, blood (routine x 2)     Status: None (Preliminary result)   Collection Time: 01/13/17 12:37 AM  Result Value Ref Range   Specimen Description BLOOD L AC    Special Requests Blood Culture adequate volume    Culture NO GROWTH < 24 HOURS    Report Status PENDING   MRSA PCR Screening     Status: None   Collection Time: 01/13/17  2:50 AM  Result Value Ref Range   MRSA by PCR NEGATIVE NEGATIVE    Comment:        The GeneXpert MRSA Assay (FDA approved for NASAL specimens only), is one component of a comprehensive MRSA colonization surveillance program. It is not intended to diagnose MRSA infection nor to guide or monitor treatment for MRSA infections.   Glucose, capillary     Status: Abnormal   Collection Time: 01/13/17  2:56 AM  Result Value Ref Range   Glucose-Capillary 117 (H) 65 - 99 mg/dL  Troponin I (q 6hr x 3)     Status: Abnormal   Collection Time: 01/13/17  3:33 AM  Result Value Ref Range   Troponin I 0.07 (HH) <0.03 ng/mL    Comment: CRITICAL VALUE NOTED. VALUE IS CONSISTENT WITH PREVIOUSLY REPORTED/CALLED VALUE. QSD    Current Facility-Administered Medications  Medication Dose Route Frequency Provider Last Rate  Last Dose  . amLODipine (NORVASC) tablet 5 mg  5 mg Oral Daily Harrie Foreman, MD   5 mg at 01/13/17 1119  . aspirin chewable tablet 81 mg  81 mg Oral Daily Harrie Foreman, MD   81 mg at 01/13/17 1120  . atorvastatin (LIPITOR) tablet 40 mg  40 mg Oral Daily Harrie Foreman, MD   40 mg at 01/13/17 1120  . enalapril (VASOTEC) tablet 5 mg  5 mg Oral BID Harrie Foreman, MD   5 mg at 01/13/17 1119  . enoxaparin (LOVENOX) injection 40 mg  40 mg Subcutaneous Q24H Harrie Foreman, MD      . famotidine (PEPCID) tablet 20 mg  20 mg Oral BID Bettey Costa, MD   20 mg at 01/13/17 1745  . isosorbide mononitrate (IMDUR) 24 hr tablet 30 mg  30 mg Oral Daily Harrie Foreman, MD   30 mg at 01/13/17 1120  . [START ON 01/14/2017] lidocaine (LIDODERM) 5 % 1 patch  1 patch Transdermal Q24H Harrie Foreman, MD      . MEDLINE mouth rinse  15 mL Mouth Rinse BID Bettey Costa, MD   15 mL at 01/13/17 1123  . metoprolol succinate (TOPROL-XL) 24 hr tablet 25 mg  25 mg Oral Daily Harrie Foreman, MD   25 mg at 01/13/17 1119  . mirtazapine (REMERON) tablet 15 mg  15 mg Oral QHS Harrie Foreman, MD      . nicotine (NICODERM CQ - dosed in mg/24 hours) patch 21 mg  21 mg Transdermal Daily Harrie Foreman, MD   21 mg at 01/13/17 1120  . omega-3 acid ethyl esters (LOVAZA) capsule 2 g  2 capsule Oral BID Harrie Foreman, MD   2 g at 01/13/17 1120  . oxybutynin (DITROPAN-XL) 24 hr tablet 10 mg  10 mg  Oral QHS Harrie Foreman, MD      . oxyCODONE (Oxy IR/ROXICODONE) immediate release tablet 5 mg  5 mg Oral BID PRN Harrie Foreman, MD      . potassium chloride SA (K-DUR,KLOR-CON) CR tablet 20 mEq  20 mEq Oral Daily Harrie Foreman, MD   20 mEq at 01/13/17 1120  . sodium chloride 0.9 % bolus 1,000 mL  1,000 mL Intravenous Once Paulette Blanch, MD   Stopped at 01/13/17 0304   And  . sodium chloride 0.9 % bolus 1,000 mL  1,000 mL Intravenous Once Paulette Blanch, MD   Stopped at 01/13/17 0308  . tiZANidine (ZANAFLEX)  tablet 4 mg  4 mg Oral BID PRN Harrie Foreman, MD      . venlafaxine XR [facility]) 24 hr capsule 150 mg  150 mg Oral Q breakfast Harrie Foreman, MD   150 mg at 01/13/17 6283    Musculoskeletal: Strength & Muscle Tone: within normal limits Gait & Station: normal Patient leans: N/A  Psychiatric Specialty Exam: Physical Exam  Nursing note and vitals reviewed. Constitutional: She appears well-developed and well-nourished.  HENT:  Head: Normocephalic and atraumatic.  Eyes: Conjunctivae are normal. Pupils are equal, round, and reactive to light.  Neck: Normal range of motion.  Cardiovascular: Regular rhythm and normal heart sounds.   Respiratory: Effort normal. No respiratory distress.  GI: Soft.  Musculoskeletal: Normal range of motion.  Neurological: She is alert.  Skin: Skin is warm and dry.  Psychiatric: She has a normal mood and affect. Her speech is normal and behavior is normal. Judgment and thought content normal. She exhibits abnormal recent memory.    Review of Systems  Constitutional: Negative.   HENT: Negative.   Eyes: Negative.   Respiratory: Negative.   Cardiovascular: Negative.   Gastrointestinal: Negative.   Musculoskeletal: Negative.   Skin: Negative.   Neurological: Negative.   Psychiatric/Behavioral: Positive for memory loss and substance abuse. Negative for depression, hallucinations and suicidal ideas. The patient is not nervous/anxious and does not have insomnia.     Blood pressure (!) 153/60, pulse (!) 55, temperature 98.6 F (37 C), temperature source Oral, resp. rate 16, height 5' (1.524 m), weight 64.5 kg (142 lb 3.2 oz), SpO2 93 %.Body mass index is 27.77 kg/m.  General Appearance: Casual  Eye Contact:  Good  Speech:  Normal Rate  Volume:  Normal  Mood:  Euthymic  Affect:  Appropriate  Thought Process:  Goal Directed  Orientation:  Full (Time, Place, and Person)  Thought Content:  Logical  Suicidal Thoughts:  No  Homicidal Thoughts:  No   Memory:  Immediate;   Fair Recent;   Fair Remote;   Fair  Judgement:  Fair  Insight:  Fair  Psychomotor Activity:  Decreased  Concentration:  Concentration: Fair  Recall:  [employer] of Knowledge:  Fair  Language:  Fair  Akathisia:  No  Handed:  Right  AIMS (if indicated):     Assets:  Desire for Improvement Financial Resources/Insurance Housing Resilience  ADL's:  Intact  Cognition:  WNL  Sleep:        Treatment Plan Summary: Plan 66 year old woman has chronic drug abuse problems. Her overdose on opiates was accidental. Patient was educated about the risks of use of opiates currently particularly in light of the risk of encountering fentanyl or other high potency drugs. Patient was counseled to look into buying a naloxone rescue kids at the drugstore and having it at  home. Patient was encouraged to get into substance abuse treatment and try and stop abusing all drugs. No need for any other psychiatric intervention. Involuntary commitment discontinued.  Disposition: Patient does not meet criteria for psychiatric inpatient admission. Supportive therapy provided about ongoing stressors.  Alethia Berthold, MD 01/13/2017 5:46 PM

## 2017-01-13 NOTE — ED Notes (Signed)
Pt son Caryn BeeKevin took black purse, navy blue pants, teal shirt, flip phone home.

## 2017-01-13 NOTE — Progress Notes (Signed)
Case d/w dr simonds and patient seen. Chart reviewed  [facility]

## 2017-01-13 NOTE — H&P (Signed)
Erica Stevens is an 66 y.o. female.   Chief Complaint: Altered mental status HPI: The patient with past medical history of hypertension,congestive heart failure, depression and polysubstance abuse presents to the emergency department for the second time today after being found unresponsive in the parking lot. The patient was found with vomitus on her shirt. Upon examination she had rhonchorous breath sounds as well. Oxygen saturations were normal on room air and chest x-ray was negative. The patient was febrile to 101.77F and had a white blood cell count of 21,000. She had been seen in the emergency department hours before for unresponsiveness secondary to benzodiazepine and cocaine use. Prior to discharge she was very alert but during this second admission she was unable to contribute to her history. Due to likely aspiration pneumonia as well as elevated troponin the emergency department staff called the hospitalist service for admission.  Past Medical History:  Diagnosis Date  . Allergic rhinitis due to pollen   . Anxiety   . Brachial neuritis or radiculitis NOS   . Cellulitis and abscess of unspecified site   . Chronic kidney disease, stage I   . Chronic pain syndrome   . Congestive heart failure, unspecified   . Coronary atherosclerosis of unspecified type of vessel, native or graft   . Degeneration of cervical intervertebral disc   . Depression   . Hypertension   . Migraine, unspecified, without mention of intractable migraine without mention of status migrainosus   . 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 Osteoporosis, unspecified   . Pure hypercholesterolemia   . Reflux esophagitis   . Thoracic or lumbosacral neuritis or radiculitis, unspecified   . Unspecified urinary incontinence     Past Surgical History:  Procedure Laterality Date  . ABDOMINAL HYSTERECTOMY    . BLADDER SURGERY    . BYPASS GRAFT    . CARDIAC CATHETERIZATION  2014   [facility]  . CHOLECYSTECTOMY    . COLONOSCOPY     . CORONARY ARTERY BYPASS GRAFT     x3 grafts;[facility]  . fasciotomy      Family History  Problem Relation Age of Onset  . Hypertension Mother   . Hyperlipidemia Mother   . Heart disease Mother    Social History:  reports that she has been smoking Cigarettes.  She has been smoking about 1.00 pack per day. She has never used smokeless tobacco. She reports that she does not drink alcohol or use drugs.  Allergies:  Allergies  Allergen Reactions  . Erythromycin   . Ivp Dye [Iodinated Diagnostic Agents]   . Sulfa Antibiotics     Stated turns orange  . Sulfur     Medications Prior to Admission  Medication Sig Dispense Refill  . albuterol (PROVENTIL HFA;VENTOLIN HFA) 108 (90 Base) MCG/ACT inhaler Inhale 2 puffs into the lungs every 6 (six) hours as needed for wheezing or shortness of breath. 1 Inhaler 2  . amLODipine (NORVASC) 5 MG tablet Take 1 tablet (5 mg total) by mouth daily. 30 tablet 0  . aspirin 81 MG tablet Take 81 mg by mouth daily.    Marland Kitchen atorvastatin (LIPITOR) 40 MG tablet Take 1 tablet (40 mg total) by mouth daily. 30 tablet 5  . enalapril (VASOTEC) 5 MG tablet Take 1 tablet (5 mg total) by mouth 2 (two) times daily. 30 tablet 5  . furosemide (LASIX) 40 MG tablet Take 0.5-1 tablets (20-40 mg total) by mouth daily. 30 tablet 5  . gabapentin (NEURONTIN) 100 MG capsule Take 1  capsule (100 mg total) by mouth 3 (three) times daily. 90 capsule 0  . gabapentin (NEURONTIN) 100 MG capsule Take 1-3 capsules (100-300 mg total) by mouth 3 (three) times daily. 90 capsule 0  . isosorbide mononitrate (IMDUR) 30 MG 24 hr tablet TAKE ONE (1) TABLET EACH DAY 90 tablet 1  . lidocaine (LIDODERM) 5 % Place 1 patch onto the skin every 12 (twelve) hours. Remove & Discard patch within 12 hours or as directed by MD 10 patch 0  . metoprolol succinate (TOPROL-XL) 25 MG 24 hr tablet TAKE ONE (1) TABLET EACH DAY 30 tablet 5  . mirtazapine (REMERON) 15 MG tablet Take 1 tablet (15 mg total) by mouth at bedtime.  30 tablet 0  . montelukast (SINGULAIR) 10 MG tablet Take 1 tablet (10 mg total) by mouth at bedtime. 30 tablet 11  . Naloxone HCl 0.4 MG/0.4ML SOAJ Follow package instructions for heroin or opioid overdose. 1 Package 1  . nicotine (NICODERM CQ - DOSED IN MG/24 HOURS) 21 mg/24hr patch Place 1 patch (21 mg total) onto the skin daily. 28 patch 0  . nitroGLYCERIN (NITROLINGUAL) 0.4 MG/SPRAY spray AS DIRECTED 4.9 g 11  . omega-3 acid ethyl esters (LOVAZA) 1 G capsule TAKE TWO CAPSULES BY MOUTH TWICE DAILY. 120 capsule 11  . oxyCODONE (OXY IR/ROXICODONE) 5 MG immediate release tablet Take 1 tablet (5 mg total) by mouth 2 (two) times daily as needed for severe pain. 30 tablet 0  . Oxycodone HCl 10 MG TABS Take 1 tablet (10 mg total) by mouth 2 (two) times daily. 60 tablet 0  . potassium chloride SA (K-DUR,KLOR-CON) 20 MEQ tablet Take 1 tablet (20 mEq total) by mouth daily. 30 tablet 5  . tiZANidine (ZANAFLEX) 4 MG tablet Take 1 tablet (4 mg total) by mouth 2 (two) times daily. 60 tablet 5  . tolterodine (DETROL) 2 MG tablet Take 1 tablet (2 mg total) by mouth 2 (two) times daily. 60 tablet 5  . traZODone (DESYREL) 50 MG tablet Take 1 tablet (50 mg total) by mouth at bedtime as needed for sleep. 30 tablet 0  . venlafaxine XR (EFFEXOR XR) 150 MG 24 hr capsule Take 1 capsule (150 mg total) by mouth daily with breakfast. 30 capsule 5    Results for orders placed or performed during the hospital encounter of 01/12/17 (from the past 48 hour(s))  CBC with Differential     Status: Abnormal   Collection Time: 01/12/17 10:52 PM  Result Value Ref Range   WBC 21.4 (H) 3.6 - 11.0 K/uL   RBC 4.39 3.80 - 5.20 MIL/uL   Hemoglobin 13.9 12.0 - 16.0 g/dL   HCT 40.2 35.0 - 47.0 %   MCV 91.4 80.0 - 100.0 fL   MCH 31.5 26.0 - 34.0 pg   MCHC 34.5 32.0 - 36.0 g/dL   RDW 14.7 (H) 11.5 - 14.5 %   Platelets 271 150 - 440 K/uL   Neutrophils Relative % 90 %   Neutro Abs 19.2 (H) 1.4 - 6.5 K/uL   Lymphocytes Relative 5 %    Lymphs Abs 1.0 1.0 - 3.6 K/uL   Monocytes Relative 5 %   Monocytes Absolute 1.1 (H) 0.2 - 0.9 K/uL   Eosinophils Relative 0 %   Eosinophils Absolute 0.1 0 - 0.7 K/uL   Basophils Relative 0 %   Basophils Absolute 0.0 0 - 0.1 K/uL  Comprehensive metabolic panel     Status: Abnormal   Collection Time: 01/12/17 10:52 PM  Result Value Ref Range   Sodium 139 135 - 145 mmol/L   Potassium 3.7 3.5 - 5.1 mmol/L    Comment: HEMOLYSIS AT THIS LEVEL MAY AFFECT RESULT   Chloride 105 101 - 111 mmol/L   CO2 25 22 - 32 mmol/L   Glucose, Bld 128 (H) 65 - 99 mg/dL   BUN 12 6 - 20 mg/dL   Creatinine, Ser 0.79 0.44 - 1.00 mg/dL   Calcium 9.1 8.9 - 10.3 mg/dL   Total Protein 8.3 (H) 6.5 - 8.1 g/dL   Albumin 4.1 3.5 - 5.0 g/dL   AST 47 (H) 15 - 41 U/L   ALT 23 14 - 54 U/L   Alkaline Phosphatase 81 38 - 126 U/L   Total Bilirubin 0.8 0.3 - 1.2 mg/dL   GFR calc non Af Amer >60 >60 mL/min   GFR calc Af Amer >60 >60 mL/min    Comment: (NOTE) The eGFR has been calculated using the CKD EPI equation. This calculation has not been validated in all clinical situations. eGFR's persistently <60 mL/min signify possible Chronic Kidney Disease.    Anion gap 9 5 - 15  Acetaminophen level     Status: Abnormal   Collection Time: 01/12/17 10:52 PM  Result Value Ref Range   Acetaminophen (Tylenol), Serum <10 (L) 10 - 30 ug/mL    Comment:        THERAPEUTIC CONCENTRATIONS VARY SIGNIFICANTLY. A RANGE OF 10-30 ug/mL MAY BE AN EFFECTIVE CONCENTRATION FOR MANY PATIENTS. HOWEVER, SOME ARE BEST TREATED AT CONCENTRATIONS OUTSIDE THIS RANGE. ACETAMINOPHEN CONCENTRATIONS >150 ug/mL AT 4 HOURS AFTER INGESTION AND >50 ug/mL AT 12 HOURS AFTER INGESTION ARE OFTEN ASSOCIATED WITH TOXIC REACTIONS.   Troponin I     Status: Abnormal   Collection Time: 01/12/17 10:52 PM  Result Value Ref Range   Troponin I 0.07 (HH) <0.03 ng/mL    Comment: CRITICAL RESULT CALLED TO, READ BACK BY AND VERIFIED WITH KATELYN KIZZIAH ON  01/13/17 AT 1829 MNS   Salicylate level     Status: None   Collection Time: 01/12/17 10:52 PM  Result Value Ref Range   Salicylate Lvl <9.3 2.8 - 30.0 mg/dL  Ethanol     Status: None   Collection Time: 01/12/17 10:52 PM  Result Value Ref Range   Alcohol, Ethyl (B) <5 <5 mg/dL    Comment:        LOWEST DETECTABLE LIMIT FOR SERUM ALCOHOL IS 5 mg/dL FOR MEDICAL PURPOSES ONLY   Lactic acid, plasma     Status: None   Collection Time: 01/12/17 11:20 PM  Result Value Ref Range   Lactic Acid, Venous 1.9 0.5 - 1.9 mmol/L  Ammonia     Status: None   Collection Time: 01/12/17 11:20 PM  Result Value Ref Range   Ammonia 28 9 - 35 umol/L  Glucose, capillary     Status: Abnormal   Collection Time: 01/12/17 11:20 PM  Result Value Ref Range   Glucose-Capillary 133 (H) 65 - 99 mg/dL  Urine Drug Screen, Qualitative     Status: Abnormal   Collection Time: 01/13/17 12:37 AM  Result Value Ref Range   Tricyclic, Ur Screen NONE DETECTED NONE DETECTED   Amphetamines, Ur Screen NONE DETECTED NONE DETECTED   MDMA (Ecstasy)Ur Screen NONE DETECTED NONE DETECTED   Cocaine Metabolite,Ur [place] POSITIVE (A) NONE DETECTED   Opiate, Ur Screen POSITIVE (A) NONE DETECTED   Phencyclidine (PCP) Ur S NONE DETECTED NONE DETECTED   Cannabinoid 50 Ng, Ur  [place]ash NONE DETECTED NONE DETECTED   Barbiturates, Ur Screen NONE DETECTED NONE DETECTED   Benzodiazepine, Ur Scrn POSITIVE (A) NONE DETECTED   Methadone Scn, Ur NONE DETECTED NONE DETECTED    Comment: (NOTE) 122  Tricyclics, urine               Cutoff 1000 ng/mL 200  Amphetamines, urine             Cutoff 1000 ng/mL 300  MDMA (Ecstasy), urine           Cutoff 500 ng/mL 400  Cocaine Metabolite, urine       Cutoff 300 ng/mL 500  Opiate, urine                   Cutoff 300 ng/mL 600  Phencyclidine (PCP), urine      Cutoff 25 ng/mL 700  Cannabinoid, urine              Cutoff 50 ng/mL 800  Barbiturates, urine             Cutoff 200 ng/mL 900  Benzodiazepine, urine            Cutoff 200 ng/mL 1000 Methadone, urine                Cutoff 300 ng/mL 1100 1200 The urine drug screen provides only a preliminary, unconfirmed 1300 analytical test result and should not be used for non-medical 1400 purposes. Clinical consideration and professional judgment should 1500 be applied to any positive drug screen result due to possible 1600 interfering substances. A more specific alternate chemical method 1700 must be used in order to obtain a confirmed analytical result.  1800 Gas chromato graphy / mass spectrometry (GC/MS) is the preferred 1900 confirmatory method.   Urinalysis, Complete w Microscopic     Status: Abnormal   Collection Time: 01/13/17 12:37 AM  Result Value Ref Range   Color, Urine YELLOW (A) YELLOW   APPearance HAZY (A) CLEAR   Specific Gravity, Urine 1.012 1.005 - 1.030   pH 6.0 5.0 - 8.0   Glucose, UA NEGATIVE NEGATIVE mg/dL   Hgb urine dipstick SMALL (A) NEGATIVE   Bilirubin Urine NEGATIVE NEGATIVE   Ketones, ur NEGATIVE NEGATIVE mg/dL   Protein, ur NEGATIVE NEGATIVE mg/dL   Nitrite NEGATIVE NEGATIVE   Leukocytes, UA NEGATIVE NEGATIVE   RBC / HPF NONE SEEN 0 - 5 RBC/hpf   WBC, UA 0-5 0 - 5 WBC/hpf   Bacteria, UA NONE SEEN NONE SEEN   Squamous Epithelial / LPF 0-5 (A) NONE SEEN   Mucous PRESENT    Amorphous Crystal PRESENT   Blood gas, arterial     Status: Abnormal   Collection Time: 01/13/17 12:37 AM  Result Value Ref Range   FIO2 0.21    pH, Arterial 7.41 7.350 - 7.450   pCO2 arterial 47 32.0 - 48.0 mmHg   pO2, Arterial 57 (L) 83.0 - 108.0 mmHg   Bicarbonate 29.8 (H) 20.0 - 28.0 mmol/L   Acid-Base Excess 4.3 (H) 0.0 - 2.0 mmol/L   O2 Saturation 89.5 %   Patient temperature 37.0    Collection site RIGHT RADIAL    Sample type ARTERIAL DRAW    Allens test (pass/fail) PASS PASS   Mechanical Rate PENDING   MRSA PCR Screening     Status: None   Collection Time: 01/13/17  2:50 AM  Result Value Ref Range   MRSA by PCR NEGATIVE NEGATIVE     Comment:  The GeneXpert MRSA Assay (FDA approved for NASAL specimens only), is one component of a comprehensive MRSA colonization surveillance program. It is not intended to diagnose MRSA infection nor to guide or monitor treatment for MRSA infections.   Glucose, capillary     Status: Abnormal   Collection Time: 01/13/17  2:56 AM  Result Value Ref Range   Glucose-Capillary 117 (H) 65 - 99 mg/dL  Troponin I (q 6hr x 3)     Status: Abnormal   Collection Time: 01/13/17  3:33 AM  Result Value Ref Range   Troponin I 0.07 (HH) <0.03 ng/mL    Comment: CRITICAL VALUE NOTED. VALUE IS CONSISTENT WITH PREVIOUSLY REPORTED/CALLED VALUE. QSD   Ct Head Wo Contrast  Result Date: 01/13/2017 CLINICAL DATA:  Brown emesis drug overdose EXAM: CT HEAD WITHOUT CONTRAST TECHNIQUE: Contiguous axial images were obtained from the base of the skull through the vertex without intravenous contrast. COMPARISON:  05/22/2012 FINDINGS: Brain: No territorial infarction, hemorrhage or intracranial mass. Mild to moderate atrophy. Slight ventricular enlargement since the prior study. Vascular: No hyperdense vessels.  Carotid artery calcifications. Skull: No fracture or suspicious bone lesion Sinuses/Orbits: Mild mucosal thickening in the ethmoid sinuses. No acute orbital abnormality Other: None IMPRESSION: 1. Negative for hemorrhage or intracranial mass. 2. Mild to moderate atrophy. Mildly enlarged ventricles, slightly increased compared to the 2013 comparison study. Electronically Signed   By: Donavan Foil M.D.   On: 01/13/2017 00:15   Dg Chest Port 1 View  Result Date: 01/12/2017 CLINICAL DATA:  Returned to the emergency department with emesis and altered mental status. Seen earlier today. EXAM: PORTABLE CHEST 1 VIEW COMPARISON:  01/12/2017 FINDINGS: Shallow inspiration. No consolidation. No effusion. No pneumothorax. Normal vasculature. Unchanged mild cardiomegaly IMPRESSION: Stable mild cardiomegaly. No consolidation  or effusion. Normal vasculature. Electronically Signed   By: Andreas Newport M.D.   On: 01/12/2017 23:36   Dg Chest Portable 1 View  Result Date: 01/12/2017 CLINICAL DATA:  Unresponsive. EXAM: PORTABLE CHEST 1 VIEW COMPARISON:  01/11/2014 . FINDINGS: Prior CABG. Heart size stable. Low lung volumes. No focal infiltrate. No pleural effusion or pneumothorax. IMPRESSION: 1. Prior CABG.  No evidence of CHF. 2.  Low lung volumes.  No acute pulmonary disease. Electronically Signed   By: Marcello Moores  Register   On: 01/12/2017 16:32    Review of Systems  Unable to perform ROS: Medical condition  Respiratory: Negative for shortness of breath.   Cardiovascular: Negative for chest pain.  Gastrointestinal: Negative for abdominal pain.  Neurological: Negative for weakness.    Blood pressure (!) 119/48, pulse 63, temperature 99.1 F (37.3 C), temperature source Oral, resp. rate 12, height 5' (1.524 m), weight 59 kg (130 lb), SpO2 95 %. Physical Exam  Vitals reviewed. Constitutional: She is oriented to person, place, and time. She appears well-developed and well-nourished. No distress.  HENT:  Head: Normocephalic and atraumatic.  Mouth/Throat: Oropharynx is clear and moist.  Eyes: Conjunctivae and EOM are normal. Pupils are equal, round, and reactive to light. No scleral icterus.  Neck: Normal range of motion. Neck supple. No JVD present. No tracheal deviation present. No thyromegaly present.  Cardiovascular: Normal rate, regular rhythm and normal heart sounds.  Exam reveals no gallop and no friction rub.   No murmur heard. Respiratory: Effort normal and breath sounds normal.  GI: Soft. Bowel sounds are normal. She exhibits no distension. There is no tenderness.  Genitourinary:  Genitourinary Comments: Deferred  Musculoskeletal: Normal range of motion. She exhibits no edema.  Lymphadenopathy:    She has no cervical adenopathy.  Neurological: She is alert and oriented to person, place, and time. No  cranial nerve deficit. She exhibits normal muscle tone.  Skin: Skin is warm and dry. No rash noted. No erythema.  Psychiatric: She has a normal mood and affect. Her behavior is normal. Judgment and thought content normal.     Assessment/Plan This is a 66 year old female admitted for aspiration pneumonia and altered mental status. 1. Aspiration pneumonia: The patient has received Zosyn, clindamycin and azithromycin in the emergency department. I have continued the patient on azithromycin and ceftriaxone. Oxygen saturations continue to be normal on room air. She is currently afebrile. She does not consistently meet criteria for sepsis. 2. Altered mental status: Somnolence; resolving. By the time of my interview the patient was more consistently answering questions although still could not elaborate on details. She wants to know when she can go home. However, the patient was known to have a large quantity of tramadol in her purse which her son has now confiscated. Due to apparent intentional overdose she has now been involuntarily committed and will be evaluated by psychiatry after being cleared by the medicine service. 3. Essential hypertension: Initially very elevated; now controlled. Continue amlodipine, enalapril and metoprolol 4. Elevated troponin: Likely secondary to demand ischemia. Continue Imdur. Follow cardiac biomarkers. Continue aspirin 5. CHF: Presumably systolic. Continue Lasix per home regimen 6. Hyperlipidemia: Continue statin therapy 7. Chronic pain: I have decreased the patient's dosing of narcotics and made some of her muscle relaxers when necessary 8. Depression: Continue Remeron and Effexor 9. DVT prophylaxis: Lovenox 10. GI prophylaxis: None The patient is a full code. Time spent on admission orders and patient care approximately 45 minutes  Harrie Foreman, MD 01/13/2017, 7:30 AM

## 2017-01-14 LAB — BASIC METABOLIC PANEL
Anion gap: 6 (ref 5–15)
BUN: 7 mg/dL (ref 6–20)
CALCIUM: 8.2 mg/dL — AB (ref 8.9–10.3)
CO2: 28 mmol/L (ref 22–32)
CREATININE: 0.64 mg/dL (ref 0.44–1.00)
Chloride: 100 mmol/L — ABNORMAL LOW (ref 101–111)
GFR calc Af Amer: >60 mL/min (ref >60)
GLUCOSE: 109 mg/dL — AB (ref 65–99)
POTASSIUM: 2.8 mmol/L — AB (ref 3.5–5.1)
Sodium: 134 mmol/L — ABNORMAL LOW (ref 135–145)

## 2017-01-14 LAB — CBC
HCT: 34 % — ABNORMAL LOW (ref 35.0–47.0)
Hemoglobin: 11.4 g/dL — ABNORMAL LOW (ref 12.0–16.0)
MCH: 31 pg (ref 26.0–34.0)
MCHC: 33.4 g/dL (ref 32.0–36.0)
MCV: 92.6 fL (ref 80.0–100.0)
PLATELETS: 194 10*3/uL (ref 150–440)
RBC: 3.67 MIL/uL — ABNORMAL LOW (ref 3.80–5.20)
RDW: 14.2 % (ref 11.5–14.5)
WBC: 13.3 10*3/uL — ABNORMAL HIGH (ref 3.6–11.0)

## 2017-01-14 LAB — STREP PNEUMONIAE URINARY ANTIGEN: Strep Pneumo Urinary Antigen: NEGATIVE

## 2017-01-14 LAB — HIV ANTIBODY (ROUTINE TESTING W REFLEX): HIV Screen 4th Generation wRfx: NONREACTIVE

## 2017-01-14 MED ORDER — POTASSIUM CHLORIDE CRYS ER 20 MEQ PO TBCR
40.0000 meq | EXTENDED_RELEASE_TABLET | Freq: Once | ORAL | Status: AC
  Administered 2017-01-14: 40 meq via ORAL
  Filled 2017-01-14: qty 2

## 2017-01-14 MED ORDER — ENALAPRIL MALEATE 5 MG PO TABS: 5.0000 mg | ORAL_TABLET | Freq: Two times a day (BID) | ORAL | 0 refills | Status: DC

## 2017-01-14 MED ORDER — ISOSORBIDE MONONITRATE ER 30 MG PO TB24: ORAL_TABLET | ORAL | 0 refills | Status: DC

## 2017-01-14 MED ORDER — POTASSIUM CHLORIDE CRYS ER 20 MEQ PO TBCR
40.0000 meq | EXTENDED_RELEASE_TABLET | ORAL | Status: AC
  Administered 2017-01-14 (×2): 40 meq via ORAL
  Filled 2017-01-14 (×2): qty 2

## 2017-01-14 MED ORDER — POTASSIUM CHLORIDE 10 MEQ/100ML IV SOLN
10.0000 meq | INTRAVENOUS | Status: AC
  Filled 2017-01-14 (×4): qty 100

## 2017-01-14 NOTE — Progress Notes (Signed)
Discharge instructions along with home medications and follow up gone over with patient. She verbalizes that she understood instructions. No prescriptions given to patient. IV and tele removed. Pt being discharged home on room air, no distress noted. Otilio JeffersonMadelyn S Fenton, RN

## 2017-01-14 NOTE — Discharge Summary (Addendum)
[facility] [place] [facility]   PATIENT NAME: Erica Stevens    MR#:  [id number]  DATE OF BIRTH:  1950/10/15  DATE OF ADMISSION:  01/12/2017 ADMITTING PHYSICIAN: Arnaldo Natal, MD  DATE OF DISCHARGE: 01/14/2017  PRIMARY CARE PHYSICIAN: [facility] [place]    ADMISSION DIAGNOSIS:  Sepsis, due to unspecified organism (HCC) [A41.9] Drug overdose, undetermined intent, initial encounter [T50.904A] Altered mental status, unspecified altered mental status type [R41.82] Aspiration pneumonia, unspecified aspiration pneumonia type, unspecified laterality, unspecified part of lung (HCC) [J69.0]  DISCHARGE DIAGNOSIS:  Principal Problem:   Opiate overdose Active Problems:   Heroin abuse     Cocaine abuse   Benzodiazepine abuse   SECONDARY DIAGNOSIS:   Past Medical History:  Diagnosis Date  . Allergic rhinitis due to pollen   . Anxiety   . Brachial neuritis or radiculitis NOS   . Cellulitis and abscess of unspecified site   . Chronic kidney disease, stage I   . Chronic pain syndrome   . Congestive heart failure, unspecified   . Coronary atherosclerosis of unspecified type of vessel, native or graft   . Degeneration of cervical intervertebral disc   . Depression   . Hypertension   . Migraine, unspecified, without mention of intractable migraine without mention of status migrainosus   . 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 Osteoporosis, unspecified   . Pure hypercholesterolemia   . Reflux esophagitis   . Thoracic or lumbosacral neuritis or radiculitis, unspecified   . Unspecified urinary incontinence     HOSPITAL COURSE:  66 year old female with a history of CAD/CABG and essential hypertension who presented with encephalopathy due to drug overdose. Her UDS was positive for opiates and cocaine. She was discharged from the ER. Shortly thereafter she was brought to the back to the emergency room for disorientation and somnolence.  1. Acute  encephalopathy in the setting of drug overdose: Patient's mental status is back to baseline. Patient was evaluated by psychiatry and reports accidental overdose. IVC paperwork was discontinued by psychiatrist. She is encouraged to stop using illicit drugs.  2. CAD/CABG: Patient will continue on aspirin, statin, metoprolol, isosorbide and ACE inhibitor.  3. Essential hypertension: Patient will continue metoprolol, isosorbide, [facility] and Norvasc.  4. Elevated troponin due to demand ischemia and not ACS  5. Leukocytosis: Initially was thought the patient may have some aspiration pneumonia. Discussed this with intensivist. It was not felt patient had aspiration pneumonia however may have some pneumonitis. She did not require antibiotics.  6. Tobacco dependence: Patient is encouraged to quit smoking. Counseling was provided for 4 minutes.  7. Hypokalemia this was repleted  DISCHARGE CONDITIONS AND DIET:   Stable for discharge on heart healthy diet  CONSULTS OBTAINED:  Treatment Team:  Clapacs, Jackquline Denmark, MD  DRUG ALLERGIES:   Allergies  Allergen Reactions  . Erythromycin   . Ivp Dye [Iodinated Diagnostic Agents]   . Sulfa Antibiotics     Stated turns orange  . Sulfur     DISCHARGE MEDICATIONS:   Current Discharge Medication List    CONTINUE these medications which have CHANGED   Details  enalapril (VASOTEC) 5 MG tablet Take 1 tablet (5 mg total) by mouth 2 (two) times daily. Qty: 30 tablet, Refills: 0   Associated Diagnoses: Benign hypertensive heart disease, without heart failure; Systolic congestive heart failure (HCC)    isosorbide mononitrate (IMDUR) 30 MG 24 hr tablet TAKE ONE (1) TABLET EACH DAY Qty: 30 tablet, Refills: 0  Associated Diagnoses: Angina pectoris (HCC)      CONTINUE these medications which have NOT CHANGED   Details  albuterol (PROVENTIL HFA;VENTOLIN HFA) 108 (90 Base) MCG/ACT inhaler Inhale 2 puffs into the lungs every 6 (six) hours as  needed for wheezing or shortness of breath. Qty: 1 Inhaler, Refills: 2    amLODipine (NORVASC) 5 MG tablet Take 1 tablet (5 mg total) by mouth daily. Qty: 30 tablet, Refills: 0    metoprolol succinate (TOPROL-XL) 25 MG 24 hr tablet TAKE ONE (1) TABLET EACH DAY Qty: 30 tablet, Refills: 5   Associated Diagnoses: Benign hypertensive heart disease, without heart failure; Systolic congestive heart failure (HCC)    Naloxone HCl 0.4 MG/0.4ML SOAJ Follow package instructions for heroin or opioid overdose. Qty: 1 Package, Refills: 1    nitroGLYCERIN (NITROLINGUAL) 0.4 MG/SPRAY spray AS DIRECTED Qty: 4.9 g, Refills: 11    aspirin 81 MG tablet Take 81 mg by mouth daily.    atorvastatin (LIPITOR) 40 MG tablet Take 1 tablet (40 mg total) by mouth daily. Qty: 30 tablet, Refills: 5   Associated Diagnoses: Hyperlipemia    mirtazapine (REMERON) 15 MG tablet Take 1 tablet (15 mg total) by mouth at bedtime. Qty: 30 tablet, Refills: 0    montelukast (SINGULAIR) 10 MG tablet Take 1 tablet (10 mg total) by mouth at bedtime. Qty: 30 tablet, Refills: 11    nicotine (NICODERM CQ - DOSED IN MG/24 HOURS) 21 mg/24hr patch Place 1 patch (21 mg total) onto the skin daily. Qty: 28 patch, Refills: 0    omega-3 acid ethyl esters (LOVAZA) 1 G capsule TAKE TWO CAPSULES BY MOUTH TWICE DAILY. Qty: 120 capsule, Refills: 11      STOP taking these medications     gabapentin (NEURONTIN) 100 MG capsule      furosemide (LASIX) 40 MG tablet      lidocaine (LIDODERM) 5 %      oxyCODONE (OXY IR/ROXICODONE) 5 MG immediate release tablet      Oxycodone HCl 10 MG TABS      potassium chloride SA (K-DUR,KLOR-CON) 20 MEQ tablet      tiZANidine (ZANAFLEX) 4 MG tablet      tolterodine (DETROL) 2 MG tablet      traZODone (DESYREL) 50 MG tablet      venlafaxine XR (EFFEXOR XR) 150 MG 24 hr capsule           Today   CHIEF COMPLAINT:  Doing well this am says heroin was laced with cocaine   VITAL SIGNS:   Blood pressure (!) 169/60, pulse 76, temperature 99.8 F (37.7 C), temperature source Oral, resp. rate (!) 22, height 5' (1.524 m), weight 64.5 kg (142 lb 3.2 oz), SpO2 95 %.   REVIEW OF SYSTEMS:  Review of Systems  Constitutional: Negative.  Negative for chills, fever and malaise/fatigue.  HENT: Negative.  Negative for ear discharge, ear pain, hearing loss, nosebleeds and sore throat.   Eyes: Negative.  Negative for blurred vision and pain.  Respiratory: Negative.  Negative for cough, hemoptysis, shortness of breath and wheezing.   Cardiovascular: Negative.  Negative for chest pain, palpitations and leg swelling.  Gastrointestinal: Negative.  Negative for abdominal pain, blood in stool, diarrhea, nausea and vomiting.  Genitourinary: Negative.  Negative for dysuria.  Musculoskeletal: Negative.  Negative for back pain.  Skin: Negative.   Neurological: Negative for dizziness, tremors, speech change, focal weakness, seizures and headaches.  Endo/Heme/Allergies: Negative.  Does not bruise/bleed easily.  Psychiatric/Behavioral: Positive for substance  abuse. Negative for depression, hallucinations and suicidal ideas.     PHYSICAL EXAMINATION:  GENERAL:  66 y.o.-year-old patient lying in the bed with no acute distress.  NECK:  Supple, no jugular venous distention. No thyroid enlargement, no tenderness.  LUNGS: Normal breath sounds bilaterally, no wheezing, rales,rhonchi  No use of accessory muscles of respiration.  CARDIOVASCULAR: S1, S2 normal. No murmurs, rubs, or gallops.  ABDOMEN: Soft, non-tender, non-distended. Bowel sounds present. No organomegaly or mass.  EXTREMITIES: No pedal edema, cyanosis, or clubbing.  PSYCHIATRIC: The patient is alert and oriented x 3.  SKIN: No obvious rash, lesion, or ulcer.   DATA REVIEW:   CBC  Recent Labs Lab 01/14/17 0718  WBC 13.3*  HGB 11.4*  HCT 34.0*  PLT 194    Chemistries   Recent Labs Lab 01/12/17 2252 01/14/17 0718  NA 139  134*  K 3.7 2.8*  CL 105 100*  CO2 25 28  GLUCOSE 128* 109*  BUN 12 7  CREATININE 0.79 0.64  CALCIUM 9.1 8.2*  AST 47*  --   ALT 23  --   ALKPHOS 81  --   BILITOT 0.8  --     Cardiac Enzymes  Recent Labs Lab 01/12/17 1630 01/12/17 2252 01/13/17 0333  TROPONINI <0.03 0.07* 0.07*    Microbiology Results  @MICRORSLT48 @  RADIOLOGY:  Ct Head Wo Contrast  Result Date: 01/13/2017 CLINICAL DATA:  Brown emesis drug overdose EXAM: CT HEAD WITHOUT CONTRAST TECHNIQUE: Contiguous axial images were obtained from the base of the skull through the vertex without intravenous contrast. COMPARISON:  05/22/2012 FINDINGS: Brain: No territorial infarction, hemorrhage or intracranial mass. Mild to moderate atrophy. Slight ventricular enlargement since the prior study. Vascular: No hyperdense vessels.  Carotid artery calcifications. Skull: No fracture or suspicious bone lesion Sinuses/Orbits: Mild mucosal thickening in the ethmoid sinuses. No acute orbital abnormality Other: None IMPRESSION: 1. Negative for hemorrhage or intracranial mass. 2. Mild to moderate atrophy. Mildly enlarged ventricles, slightly increased compared to the 2013 comparison study. Electronically Signed   By: Jasmine PangKim  Fujinaga M.D.   On: 01/13/2017 00:15   Dg Chest Port 1 View  Result Date: 01/12/2017 CLINICAL DATA:  Returned to the emergency department with emesis and altered mental status. Seen earlier today. EXAM: PORTABLE CHEST 1 VIEW COMPARISON:  01/12/2017 FINDINGS: Shallow inspiration. No consolidation. No effusion. No pneumothorax. Normal vasculature. Unchanged mild cardiomegaly IMPRESSION: Stable mild cardiomegaly. No consolidation or effusion. Normal vasculature. Electronically Signed   By: Ellery Plunkaniel R Mitchell M.D.   On: 01/12/2017 23:36   Dg Chest Portable 1 View  Result Date: 01/12/2017 CLINICAL DATA:  Unresponsive. EXAM: PORTABLE CHEST 1 VIEW COMPARISON:  01/11/2014 . FINDINGS: Prior CABG. Heart size stable. Low lung volumes.  No focal infiltrate. No pleural effusion or pneumothorax. IMPRESSION: 1. Prior CABG.  No evidence of CHF. 2.  Low lung volumes.  No acute pulmonary disease. Electronically Signed   By: Maisie Fushomas  Register   On: 01/12/2017 16:32      Current Discharge Medication List    CONTINUE these medications which have CHANGED   Details  enalapril (VASOTEC) 5 MG tablet Take 1 tablet (5 mg total) by mouth 2 (two) times daily. Qty: 30 tablet, Refills: 0   Associated Diagnoses: Benign hypertensive heart disease, without heart failure; Systolic congestive heart failure (HCC)    isosorbide mononitrate (IMDUR) 30 MG 24 hr tablet TAKE ONE (1) TABLET EACH DAY Qty: 30 tablet, Refills: 0   Associated Diagnoses: Angina pectoris (HCC)  CONTINUE these medications which have NOT CHANGED   Details  albuterol (PROVENTIL HFA;VENTOLIN HFA) 108 (90 Base) MCG/ACT inhaler Inhale 2 puffs into the lungs every 6 (six) hours as needed for wheezing or shortness of breath. Qty: 1 Inhaler, Refills: 2    amLODipine (NORVASC) 5 MG tablet Take 1 tablet (5 mg total) by mouth daily. Qty: 30 tablet, Refills: 0    metoprolol succinate (TOPROL-XL) 25 MG 24 hr tablet TAKE ONE (1) TABLET EACH DAY Qty: 30 tablet, Refills: 5   Associated Diagnoses: Benign hypertensive heart disease, without heart failure; Systolic congestive heart failure (HCC)    Naloxone HCl 0.4 MG/0.4ML SOAJ Follow package instructions for heroin or opioid overdose. Qty: 1 Package, Refills: 1    nitroGLYCERIN (NITROLINGUAL) 0.4 MG/SPRAY spray AS DIRECTED Qty: 4.9 g, Refills: 11    aspirin 81 MG tablet Take 81 mg by mouth daily.    atorvastatin (LIPITOR) 40 MG tablet Take 1 tablet (40 mg total) by mouth daily. Qty: 30 tablet, Refills: 5   Associated Diagnoses: Hyperlipemia    mirtazapine (REMERON) 15 MG tablet Take 1 tablet (15 mg total) by mouth at bedtime. Qty: 30 tablet, Refills: 0    montelukast (SINGULAIR) 10 MG tablet Take 1 tablet (10 mg total) by  mouth at bedtime. Qty: 30 tablet, Refills: 11    nicotine (NICODERM CQ - DOSED IN MG/24 HOURS) 21 mg/24hr patch Place 1 patch (21 mg total) onto the skin daily. Qty: 28 patch, Refills: 0    omega-3 acid ethyl esters (LOVAZA) 1 G capsule TAKE TWO CAPSULES BY MOUTH TWICE DAILY. Qty: 120 capsule, Refills: 11      STOP taking these medications     gabapentin (NEURONTIN) 100 MG capsule      furosemide (LASIX) 40 MG tablet      lidocaine (LIDODERM) 5 %      oxyCODONE (OXY IR/ROXICODONE) 5 MG immediate release tablet      Oxycodone HCl 10 MG TABS      potassium chloride SA (K-DUR,KLOR-CON) 20 MEQ tablet      tiZANidine (ZANAFLEX) 4 MG tablet      tolterodine (DETROL) 2 MG tablet      traZODone (DESYREL) 50 MG tablet      venlafaxine XR (EFFEXOR XR) 150 MG 24 hr capsule           Management plans discussed with the patient and she is in agreement. Stable for discharge home with her daughter  Patient should follow up with pcp  CODE STATUS:     Code Status Orders        Start     Ordered   01/13/17 0239  Full code  Continuous     01/13/17 0238    Code Status History    Date Active Date Inactive Code Status Order ID Comments User Context   04/28/2016  6:12 PM 04/29/2016  2:51 PM Full Code [id number]  Eugenie Norrie, NP ED      TOTAL TIME TAKING CARE OF THIS PATIENT: 38 minutes.    Note: This dictation was prepared with Dragon dictation along with smaller phrase technology. Any transcriptional errors that result from this process are unintentional.  Lesleyann Fichter M.D on 01/14/2017 at 8:17 AM  Between 7am to 6pm - Pager - [phone] After 6pm go to [email or web address] - Social research officer, government  [facility]  Office  [phone]  CC: Primary care physician; [facility], [place]

## 2017-01-15 LAB — BLOOD GAS, ARTERIAL
ACID-BASE EXCESS: 4.3 mmol/L — AB (ref 0.0–2.0)
Bicarbonate: 29.8 mmol/L — ABNORMAL HIGH (ref 20.0–28.0)
FIO2: 0.21
O2 SAT: 89.5 %
PCO2 ART: 47 mmHg (ref 32.0–48.0)
PO2 ART: 57 mmHg — AB (ref 83.0–108.0)
Patient temperature: 37
pH, Arterial: 7.41 (ref 7.350–7.450)

## 2017-01-17 ENCOUNTER — Ambulatory Visit: Payer: Self-pay | Admitting: Family Medicine

## 2017-01-18 LAB — CULTURE, BLOOD (ROUTINE X 2)
CULTURE: NO GROWTH
Culture: NO GROWTH
Special Requests: ADEQUATE
Special Requests: ADEQUATE

## 2017-02-25 ENCOUNTER — Emergency Department: Payer: Medicare HMO

## 2017-02-25 ENCOUNTER — Emergency Department
Admission: EM | Admit: 2017-02-25 | Discharge: 2017-02-26 | Disposition: A | Discharge status: Home / Self Care | Payer: Medicare HMO | Attending: Emergency Medicine | Admitting: Emergency Medicine

## 2017-02-25 DIAGNOSIS — I2510 Atherosclerotic heart disease of native coronary artery without angina pectoris: Secondary | ICD-10-CM | POA: Diagnosis not present

## 2017-02-25 DIAGNOSIS — I130 Hypertensive heart and chronic kidney disease with heart failure and stage 1 through stage 4 chronic kidney disease, or unspecified chronic kidney disease: Secondary | ICD-10-CM | POA: Insufficient documentation

## 2017-02-25 DIAGNOSIS — M62830 Muscle spasm of back: Secondary | ICD-10-CM | POA: Diagnosis not present

## 2017-02-25 DIAGNOSIS — G894 Chronic pain syndrome: Secondary | ICD-10-CM | POA: Diagnosis not present

## 2017-02-25 DIAGNOSIS — W010XXA Fall on same level from slipping, tripping and stumbling without subsequent striking against object, initial encounter: Secondary | ICD-10-CM | POA: Insufficient documentation

## 2017-02-25 DIAGNOSIS — N181 Chronic kidney disease, stage 1: Secondary | ICD-10-CM | POA: Diagnosis not present

## 2017-02-25 DIAGNOSIS — Y929 Unspecified place or not applicable: Secondary | ICD-10-CM | POA: Insufficient documentation

## 2017-02-25 DIAGNOSIS — F17210 Nicotine dependence, cigarettes, uncomplicated: Secondary | ICD-10-CM | POA: Insufficient documentation

## 2017-02-25 DIAGNOSIS — Y9301 Activity, walking, marching and hiking: Secondary | ICD-10-CM | POA: Insufficient documentation

## 2017-02-25 DIAGNOSIS — I509 Heart failure, unspecified: Secondary | ICD-10-CM | POA: Insufficient documentation

## 2017-02-25 DIAGNOSIS — Y999 Unspecified external cause status: Secondary | ICD-10-CM | POA: Diagnosis not present

## 2017-02-25 DIAGNOSIS — S299XXA Unspecified injury of thorax, initial encounter: Secondary | ICD-10-CM | POA: Diagnosis not present

## 2017-02-25 DIAGNOSIS — W19XXXA Unspecified fall, initial encounter: Secondary | ICD-10-CM

## 2017-02-25 DIAGNOSIS — M549 Dorsalgia, unspecified: Secondary | ICD-10-CM | POA: Diagnosis not present

## 2017-02-25 DIAGNOSIS — Z951 Presence of aortocoronary bypass graft: Secondary | ICD-10-CM | POA: Insufficient documentation

## 2017-02-25 DIAGNOSIS — S22080A Wedge compression fracture of T11-T12 vertebra, initial encounter for closed fracture: Secondary | ICD-10-CM | POA: Diagnosis not present

## 2017-02-25 DIAGNOSIS — S3993XA Unspecified injury of pelvis, initial encounter: Secondary | ICD-10-CM | POA: Diagnosis not present

## 2017-02-25 DIAGNOSIS — M545 Low back pain: Secondary | ICD-10-CM | POA: Diagnosis not present

## 2017-02-25 MED ORDER — OXYCODONE-ACETAMINOPHEN 5-325 MG PO TABS
2.0000 | ORAL_TABLET | Freq: Once | ORAL | Status: AC
  Administered 2017-02-25: 2 via ORAL
  Filled 2017-02-25: qty 2

## 2017-02-25 NOTE — ED Provider Notes (Signed)
[facility] Emergency Department Provider Note       Time seen: ----------------------------------------- 11:24 PM on 02/25/2017 -----------------------------------------     I have reviewed the triage vital signs and the nursing notes.   HISTORY   Chief Complaint Fall    HPI Erica Stevens is a 66 y.o. female who presents to the ED for a fall. Patient arrived by EMS after a fall where she was walking to her front door and she slipped and fell. Patient states she landed flat on her back. She is now having 10 out of 10 lower back pain. She was able to walk prior to arrival. She denies fevers, chills, recent illness or other complaints.   Past Medical History:  Diagnosis Date  . Allergic rhinitis due to pollen   . Anxiety   . Brachial neuritis or radiculitis NOS   . Cellulitis and abscess of unspecified site   . Chronic kidney disease, stage I   . Chronic pain syndrome   . Congestive heart failure, unspecified   . Coronary atherosclerosis of unspecified type of vessel, native or graft   . Degeneration of cervical intervertebral disc   . Depression   . Hypertension   . Migraine, unspecified, without mention of intractable migraine without mention of status migrainosus   . 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 Osteoporosis, unspecified   . Pure hypercholesterolemia   . Reflux esophagitis   . Thoracic or lumbosacral neuritis or radiculitis, unspecified   . Unspecified urinary incontinence     Patient Active Problem List   Diagnosis Date Noted  . Aspiration pneumonia (HCC) 01/13/2017  . Opiate overdose 01/13/2017  . Cocaine abuse 01/13/2017  . Benzodiazepine abuse 01/13/2017  . Heroin abuse 05/03/2016  . Anoxia 05/03/2016  . 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 Accidental heroin overdose 05/03/2016  . Severe recurrent major depression (HCC) 05/01/2016  . Acute respiratory failure (HCC) 04/28/2016  . Angina pectoris (HCC) 02/24/2016  . Cervical  disc disease 10/02/2015  . Affective disorder, major 07/02/2015  . 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 Benign hypertensive heart disease 03/09/2015  . Migraine 03/09/2015  . Billowing mitral valve 03/09/2015  . Osteopetrosis 03/09/2015  . Urge incontinence 03/09/2015  . Chest pressure 06/21/2013  . Leg edema 06/21/2013  . Shortness of breath 06/21/2013  . CAD (coronary artery disease) of artery bypass graft 06/21/2013  . Hyperlipidemia 06/21/2013  . Chronic pain 03/25/2009  . Hypercholesterolemia without hypertriglyceridemia 05/22/2007  . Coronary atherosclerosis 05/22/2007  . 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 Brachial neuritis 12/29/2006    Past Surgical History:  Procedure Laterality Date  . ABDOMINAL HYSTERECTOMY    . BLADDER SURGERY    . BYPASS GRAFT    . CARDIAC CATHETERIZATION  2014   [facility]  . CHOLECYSTECTOMY    . COLONOSCOPY    . CORONARY ARTERY BYPASS GRAFT     x3 grafts;[facility]  . fasciotomy      Allergies Erythromycin; Ivp dye [iodinated diagnostic agents]; Sulfa antibiotics; and Sulfur  Social History Social History  Substance Use Topics  . Smoking status: Current Every Day Smoker    Packs/day: 1.00    Types: Cigarettes  . Smokeless tobacco: Never Used  . Alcohol use No    Review of Systems Constitutional: Negative for fever. Eyes: Negative for vision changes ENT:  Negative for congestion, sore throat Cardiovascular: Negative for chest pain. Respiratory: Negative for shortness of breath. Gastrointestinal: Negative for abdominal pain, vomiting and diarrhea. Genitourinary:  Negative for dysuria. Musculoskeletal: Positive for back pain Skin: Negative for rash. Neurological: Negative for headaches, focal weakness or numbness.  All systems negative/normal/unremarkable except as stated in the HPI  ____________________________________________   PHYSICAL EXAM:  VITAL SIGNS: ED Triage Vitals  Enc Vitals  Group     BP 02/25/17 2216 (!) 160/96     Pulse Rate 02/25/17 2216 69     Resp 02/25/17 2216 16     Temp 02/25/17 2216 97.7 F (36.5 C)     Temp Source 02/25/17 2216 Oral     SpO2 02/25/17 2216 95 %     Weight 02/25/17 2216 130 lb (59 kg)     Height 02/25/17 2216 5' (1.524 m)     Head Circumference --      Peak Flow --      Pain Score 02/25/17 2215 10     Pain Loc --      Pain Edu? --      Excl. in GC? --    Constitutional: Alert and oriented. Well appearing and in no distress. Eyes: Conjunctivae are normal. Normal extraocular movements. ENT   Head: Normocephalic and atraumatic.   Nose: No congestion/rhinnorhea.   Mouth/Throat: Mucous membranes are moist.   Neck: No stridor. Cardiovascular: Normal rate, regular rhythm. No murmurs, rubs, or gallops. Respiratory: Normal respiratory effort without tachypnea nor retractions. Breath sounds are clear and equal bilaterally. No wheezes/rales/rhonchi. Gastrointestinal: Soft and nontender. Normal bowel sounds Musculoskeletal: Nontender with normal range of motion in extremities. No lower extremity tenderness nor edema. Negative cross straight leg raise examination Neurologic:  Normal speech and language. No gross focal neurologic deficits are appreciated.  Skin:  Skin is warm, dry and intact. No rash noted. Psychiatric: Mood and affect are normal. Speech and behavior are normal.  ____________________________________________  ED COURSE:  Pertinent labs & imaging results that were available during my care of the patient were reviewed by me and considered in my medical decision making (see chart for details). Patient presents for a fall with diffuse back and chest pain, we will assess with labs and imaging as indicated.   Procedures  RADIOLOGY Images were viewed by me  Chest x-ray, lumbar spine x-rays, pelvis x-ray IMPRESSION: 1. Acute mild compression fracture of vertebral body T12, with approximately 20% loss of height.  No significant retropulsion seen. 2. Scattered aortic atherosclerosis. These results were called by telephone at the time of interpretation on 02/26/2017 at 12:38 am to Dr. Daryel November, who verbally acknowledged these results. IMPRESSION: No evidence of fracture or dislocation. IMPRESSION: 1. No acute fracture or static subluxation of the thoracic spine. 2. Aortic Atherosclerosis (ICD10-I70.0). ____________________________________________  FINAL ASSESSMENT AND PLAN  Fall, muscle spasm  Plan: Patient's labs and imaging were dictated above. Patient had presented for a fall from standing at home after she slipped and fell. Initial x-rays were concerning for T12 fracture but this was found not to be the case on CT imaging. She is stable for outpatient follow-up with anti-inflammatory muscle relaxants.   Emily Filbert, MD   Note: This note was generated in part or whole with voice recognition software. Voice recognition is usually quite accurate but there are transcription errors that can and very often do occur. I apologize for any typographical errors that were not detected and corrected.     Emily Filbert, MD 02/26/17 [phone]

## 2017-02-25 NOTE — ED Triage Notes (Signed)
Pt arrived via ems for c/o fall - she was walking to the front door and had on bedroom shoes that were slippery and she slipped and lost her balance and fell backwards - pt denies hitting head - pt denies loss of consciousness - pt reports that she now has lower back pain 10/10 

## 2017-02-25 NOTE — ED Notes (Signed)
Pt arrived via ems for c/o fall - she was walking to the front door and had on bedroom shoes that were slippery and she slipped and lost her balance and fell backwards - pt denies hitting head - pt denies loss of consciousness - pt reports that she now has lower back pain 10/10

## 2017-02-26 ENCOUNTER — Emergency Department: Payer: Medicare HMO

## 2017-02-26 DIAGNOSIS — S22080A Wedge compression fracture of T11-T12 vertebra, initial encounter for closed fracture: Secondary | ICD-10-CM | POA: Diagnosis not present

## 2017-02-26 DIAGNOSIS — S299XXA Unspecified injury of thorax, initial encounter: Secondary | ICD-10-CM | POA: Diagnosis not present

## 2017-02-26 DIAGNOSIS — M62830 Muscle spasm of back: Secondary | ICD-10-CM | POA: Diagnosis not present

## 2017-02-26 DIAGNOSIS — S3993XA Unspecified injury of pelvis, initial encounter: Secondary | ICD-10-CM | POA: Diagnosis not present

## 2017-02-26 MED ORDER — CYCLOBENZAPRINE HCL 10 MG PO TABS: 10.0000 mg | ORAL_TABLET | Freq: Three times a day (TID) | ORAL | 0 refills | Status: DC | PRN

## 2017-02-26 MED ORDER — IBUPROFEN 600 MG PO TABS: 600.0000 mg | ORAL_TABLET | Freq: Three times a day (TID) | ORAL | 0 refills | Status: DC | PRN

## 2017-02-26 NOTE — ED Notes (Signed)
Dr. Mayford KnifeWilliams aware of pt's VS and states okay to d/c

## 2017-02-27 ENCOUNTER — Telehealth: Payer: Self-pay | Admitting: Family Medicine

## 2017-02-28 ENCOUNTER — Emergency Department: Payer: Medicare HMO

## 2017-02-28 ENCOUNTER — Observation Stay
Admission: EM | Admit: 2017-02-28 | Discharge: 2017-03-01 | Disposition: A | Discharge status: Home Health | Payer: Medicare HMO | Attending: Specialist | Admitting: Specialist

## 2017-02-28 DIAGNOSIS — Z91041 Radiographic dye allergy status: Secondary | ICD-10-CM | POA: Diagnosis not present

## 2017-02-28 DIAGNOSIS — I2510 Atherosclerotic heart disease of native coronary artery without angina pectoris: Secondary | ICD-10-CM | POA: Diagnosis not present

## 2017-02-28 DIAGNOSIS — Z7982 Long term (current) use of aspirin: Secondary | ICD-10-CM | POA: Insufficient documentation

## 2017-02-28 DIAGNOSIS — W19XXXD Unspecified fall, subsequent encounter: Secondary | ICD-10-CM | POA: Diagnosis not present

## 2017-02-28 DIAGNOSIS — G894 Chronic pain syndrome: Secondary | ICD-10-CM | POA: Diagnosis not present

## 2017-02-28 DIAGNOSIS — R4781 Slurred speech: Secondary | ICD-10-CM | POA: Diagnosis not present

## 2017-02-28 DIAGNOSIS — F17210 Nicotine dependence, cigarettes, uncomplicated: Secondary | ICD-10-CM | POA: Insufficient documentation

## 2017-02-28 DIAGNOSIS — R4182 Altered mental status, unspecified: Secondary | ICD-10-CM | POA: Insufficient documentation

## 2017-02-28 DIAGNOSIS — Z881 Allergy status to other antibiotic agents status: Secondary | ICD-10-CM | POA: Insufficient documentation

## 2017-02-28 DIAGNOSIS — Z951 Presence of aortocoronary bypass graft: Secondary | ICD-10-CM | POA: Insufficient documentation

## 2017-02-28 DIAGNOSIS — S0990XA Unspecified injury of head, initial encounter: Secondary | ICD-10-CM | POA: Diagnosis not present

## 2017-02-28 DIAGNOSIS — S22089D Unspecified fracture of T11-T12 vertebra, subsequent encounter for fracture with routine healing: Secondary | ICD-10-CM | POA: Diagnosis not present

## 2017-02-28 DIAGNOSIS — N181 Chronic kidney disease, stage 1: Secondary | ICD-10-CM | POA: Diagnosis not present

## 2017-02-28 DIAGNOSIS — M549 Dorsalgia, unspecified: Secondary | ICD-10-CM | POA: Diagnosis not present

## 2017-02-28 DIAGNOSIS — Z79899 Other long term (current) drug therapy: Secondary | ICD-10-CM | POA: Insufficient documentation

## 2017-02-28 DIAGNOSIS — M545 Low back pain, unspecified: Secondary | ICD-10-CM

## 2017-02-28 DIAGNOSIS — I130 Hypertensive heart and chronic kidney disease with heart failure and stage 1 through stage 4 chronic kidney disease, or unspecified chronic kidney disease: Secondary | ICD-10-CM | POA: Diagnosis not present

## 2017-02-28 DIAGNOSIS — I509 Heart failure, unspecified: Secondary | ICD-10-CM | POA: Insufficient documentation

## 2017-02-28 DIAGNOSIS — I10 Essential (primary) hypertension: Secondary | ICD-10-CM | POA: Diagnosis not present

## 2017-02-28 DIAGNOSIS — Z882 Allergy status to sulfonamides status: Secondary | ICD-10-CM | POA: Diagnosis not present

## 2017-02-28 LAB — CBC WITH DIFFERENTIAL/PLATELET
BASOS ABS: 0 10*3/uL (ref 0–0.1)
Basophils Relative: 0 %
Eosinophils Absolute: 0.3 10*3/uL (ref 0–0.7)
Eosinophils Relative: 4 %
HEMATOCRIT: 41.3 % (ref 35.0–47.0)
Hemoglobin: 14.2 g/dL (ref 12.0–16.0)
LYMPHS PCT: 46 %
Lymphs Abs: 3.7 10*3/uL — ABNORMAL HIGH (ref 1.0–3.6)
MCH: 33.1 pg (ref 26.0–34.0)
MCHC: 34.4 g/dL (ref 32.0–36.0)
MCV: 96.2 fL (ref 80.0–100.0)
MONO ABS: 0.6 10*3/uL (ref 0.2–0.9)
Monocytes Relative: 7 %
NEUTROS ABS: 3.4 10*3/uL (ref 1.4–6.5)
Neutrophils Relative %: 43 %
Platelets: 233 10*3/uL (ref 150–440)
RBC: 4.3 MIL/uL (ref 3.80–5.20)
RDW: 14.4 % (ref 11.5–14.5)
WBC: 8 10*3/uL (ref 3.6–11.0)

## 2017-02-28 LAB — BASIC METABOLIC PANEL
ANION GAP: 8 (ref 5–15)
BUN: 13 mg/dL (ref 6–20)
CO2: 29 mmol/L (ref 22–32)
Calcium: 9.2 mg/dL (ref 8.9–10.3)
Chloride: 103 mmol/L (ref 101–111)
Creatinine, Ser: 0.87 mg/dL (ref 0.44–1.00)
GFR calc Af Amer: >60 mL/min (ref >60)
GFR calc non Af Amer: >60 mL/min (ref >60)
GLUCOSE: 97 mg/dL (ref 65–99)
POTASSIUM: 3.8 mmol/L (ref 3.5–5.1)
Sodium: 140 mmol/L (ref 135–145)

## 2017-02-28 LAB — TROPONIN I: Troponin I: <0.03 ng/mL (ref <0.03)

## 2017-02-28 LAB — ETHANOL: Alcohol, Ethyl (B): <5 mg/dL (ref <5)

## 2017-02-28 MED ORDER — LABETALOL HCL 5 MG/ML IV SOLN
10.0000 mg | Freq: Once | INTRAVENOUS | Status: AC
  Administered 2017-02-28: 10 mg via INTRAVENOUS
  Filled 2017-02-28: qty 4

## 2017-02-28 NOTE — ED Triage Notes (Signed)
Pt comes into the ED via EMS from home with c/o trip and fall with c/o back pain.. Pt is lethargic with slurred speech on arrival. States she took 1 flexeril today. Pt was seen here 2 days ago with lower back pain with a hx of overdose. Pt denies taking any other medications or drugs today.

## 2017-02-28 NOTE — ED Triage Notes (Signed)
Pt here from home via [facility] with c/o lower back pain, has taken 4 flexeril today. Pt in urine soaked clothes, history of overdose a few months ago.

## 2017-02-28 NOTE — ED Provider Notes (Signed)
[facility] Emergency Department Provider Note  ____________________________________________   First MD Initiated Contact with Patient 02/28/17 2203     (approximate)  I have reviewed the triage vital signs and the nursing notes.   HISTORY  Chief Complaint Fall   HPI Erica Stevens is a 66 y.o. female with a history of anxiety as well as chronic kidney disease who is presenting to the emergency department today complaining of slurred speech as well as low back pain. She was seen here 2 days ago for a fall and had what appeared to be a T12 compression fracture. So far today she has taken 3 doses of Flexeril. Her daughter is concerned because of the slurred speech which she says is since this morning. The daughter says that she last spoke with her mother last night and she was acting and speaking normally.  The patient is complaining of moderate to severe low back pain at this time which is nonradiating. She says that she was unable to ambulate earlier today because of the pain.   Past Medical History:  Diagnosis Date  . Allergic rhinitis due to pollen   . Anxiety   . Brachial neuritis or radiculitis NOS   . Cellulitis and abscess of unspecified site   . Chronic kidney disease, stage I   . Chronic pain syndrome   . Congestive heart failure, unspecified   . Coronary atherosclerosis of unspecified type of vessel, native or graft   . Degeneration of cervical intervertebral disc   . Depression   . Hypertension   . Migraine, unspecified, without mention of intractable migraine without mention of status migrainosus   . 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 Osteoporosis, unspecified   . Pure hypercholesterolemia   . Reflux esophagitis   . Thoracic or lumbosacral neuritis or radiculitis, unspecified   . Unspecified urinary incontinence     Patient Active Problem List   Diagnosis Date Noted  . Aspiration pneumonia (HCC) 01/13/2017  . Opiate overdose  01/13/2017  . Cocaine abuse 01/13/2017  . Benzodiazepine abuse 01/13/2017  . Heroin abuse 05/03/2016  . Anoxia 05/03/2016  . 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 Accidental heroin overdose 05/03/2016  . Severe recurrent major depression (HCC) 05/01/2016  . Acute respiratory failure (HCC) 04/28/2016  . Angina pectoris (HCC) 02/24/2016  . Cervical disc disease 10/02/2015  . Affective disorder, major 07/02/2015  . 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 Benign hypertensive heart disease 03/09/2015  . Migraine 03/09/2015  . Billowing mitral valve 03/09/2015  . Osteopetrosis 03/09/2015  . Urge incontinence 03/09/2015  . Chest pressure 06/21/2013  . Leg edema 06/21/2013  . Shortness of breath 06/21/2013  . CAD (coronary artery disease) of artery bypass graft 06/21/2013  . Hyperlipidemia 06/21/2013  . Chronic pain 03/25/2009  . Hypercholesterolemia without hypertriglyceridemia 05/22/2007  . Coronary atherosclerosis 05/22/2007  . 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 Brachial neuritis 12/29/2006    Past Surgical History:  Procedure Laterality Date  . ABDOMINAL HYSTERECTOMY    . BLADDER SURGERY    . BYPASS GRAFT    . CARDIAC CATHETERIZATION  2014   [facility]  . CHOLECYSTECTOMY    . COLONOSCOPY    . CORONARY ARTERY BYPASS GRAFT     x3 grafts;[facility]  . fasciotomy      Prior to Admission medications   Medication Sig Start Date End Date Taking? Authorizing Provider  albuterol (PROVENTIL HFA;VENTOLIN HFA) 108 (90 Base) MCG/ACT inhaler Inhale 2 puffs into the  lungs every 6 (six) hours as needed for wheezing or shortness of breath. 05/02/16   Alford Highland, MD  amLODipine (NORVASC) 5 MG tablet Take 1 tablet (5 mg total) by mouth daily. 01/01/17   Willy Eddy, MD  aspirin 81 MG tablet Take 81 mg by mouth daily.    [provider]  atorvastatin (LIPITOR) 40 MG tablet Take 1 tablet (40 mg total) by mouth daily. Patient not taking: Reported on  01/13/2017 02/24/16   [name], MD  cyclobenzaprine (FLEXERIL) 10 MG tablet Take 1 tablet (10 mg total) by mouth 3 (three) times daily as needed for muscle spasms. 02/26/17   Emily Filbert, MD  enalapril (VASOTEC) 5 MG tablet Take 1 tablet (5 mg total) by mouth 2 (two) times daily. 01/14/17   Adrian Saran, MD  ibuprofen (ADVIL,MOTRIN) 600 MG tablet Take 1 tablet (600 mg total) by mouth every 8 (eight) hours as needed. 02/26/17   Emily Filbert, MD  isosorbide mononitrate (IMDUR) 30 MG 24 hr tablet TAKE ONE (1) TABLET EACH DAY 01/14/17   Adrian Saran, MD  metoprolol succinate (TOPROL-XL) 25 MG 24 hr tablet TAKE ONE (1) TABLET EACH DAY 01/01/17   Willy Eddy, MD  mirtazapine (REMERON) 15 MG tablet Take 1 tablet (15 mg total) by mouth at bedtime. Patient not taking: Reported on 01/13/2017 05/02/16   Alford Highland, MD  montelukast (SINGULAIR) 10 MG tablet Take 1 tablet (10 mg total) by mouth at bedtime. Patient not taking: Reported on 01/13/2017 02/25/16   [name], MD  Naloxone HCl 0.4 MG/0.4ML SOAJ Follow package instructions for heroin or opioid overdose. 01/12/17   Sharman Cheek, MD  nicotine (NICODERM CQ - DOSED IN MG/24 HOURS) 21 mg/24hr patch Place 1 patch (21 mg total) onto the skin daily. Patient not taking: Reported on 01/13/2017 05/02/16   Alford Highland, MD  nitroGLYCERIN (NITROLINGUAL) 0.4 MG/SPRAY spray AS DIRECTED 07/06/15   Dennison Mascot, MD  omega-3 acid ethyl esters (LOVAZA) 1 G capsule TAKE TWO CAPSULES BY MOUTH TWICE DAILY. Patient not taking: Reported on 01/13/2017 07/06/15   Dennison Mascot, MD    Allergies Erythromycin; Ivp dye [iodinated diagnostic agents]; Sulfa antibiotics; and Sulfur  Family History  Problem Relation Age of Onset  . Hypertension Mother   . Hyperlipidemia Mother   . Heart disease Mother     Social History Social History  Substance Use Topics  . Smoking status: Current Every Day Smoker    Packs/day: 1.00    Types: Cigarettes    . Smokeless tobacco: Never Used  . Alcohol use No    Review of Systems  Constitutional: No fever/chills Eyes: No visual changes. ENT: No sore throat. Cardiovascular: Denies chest pain. Respiratory: Denies shortness of breath. Gastrointestinal: No abdominal pain.  No nausea, no vomiting.  No diarrhea.  No constipation. Genitourinary: Negative for dysuria. Musculoskeletal: As above Skin: Negative for rash. Neurological: Negative for headaches, focal weakness or numbness.   ____________________________________________   PHYSICAL EXAM:  VITAL SIGNS: ED Triage Vitals  Enc Vitals Group     BP 02/28/17 1849 (!) 188/97     Pulse Rate 02/28/17 1849 85     Resp 02/28/17 1849 16     Temp 02/28/17 1849 98.7 F (37.1 C)     Temp Source 02/28/17 1849 Oral     SpO2 02/28/17 1849 94 %     Weight 02/28/17 1850 130 lb (59 kg)     Height 02/28/17 1850 5' (1.524 m)     Head  Circumference --      Peak Flow --      Pain Score 02/28/17 1852 10     Pain Loc --      Pain Edu? --      Excl. in GC? --     Constitutional: Alert and oriented. Well appearing and in no acute distress. Eyes: Conjunctivae are normal.  Head: Atraumatic. Nose: No congestion/rhinnorhea. Mouth/Throat: Mucous membranes are moist. No tongue swelling noted. Neck: No stridor.   Cardiovascular: Normal rate, regular rhythm. Grossly normal heart sounds.   Respiratory: Normal respiratory effort.  No retractions. Lungs CTAB. Gastrointestinal: Soft and nontender. No distention. No CVA tenderness. Musculoskeletal: No lower extremity tenderness nor edema.  No joint effusions.  No saddle anesthesia. 55 strength in bilateral lower extremities. Week 5 out of 5 to left lower extremity secondary to patient saying that she has had surgery to the left hip in the past.  Mild tenderness to palpation to the low lumbar region across the low back.  No deformity or step-off.  Neurologic:  Slowed/slurred speech.  Skin:  Skin is warm,  dry and intact. No rash noted. Psychiatric: Mood and affect are normal. Speech and behavior are normal.  ____________________________________________   LABS (all labs ordered are listed, but only abnormal results are displayed)  Labs Reviewed  CBC WITH DIFFERENTIAL/PLATELET - Abnormal; Notable for the following:       Result Value   Lymphs Abs 3.7 (*)    All other components within normal limits  BASIC METABOLIC PANEL  TROPONIN I  URINALYSIS, COMPLETE (UACMP) WITH MICROSCOPIC  URINE DRUG SCREEN, QUALITATIVE ([facility] ONLY)  ETHANOL   ____________________________________________  EKG  ED ECG REPORT I, Arelia Longest, the attending physician, personally viewed and interpreted this ECG.   Date: 02/28/2017  EKG Time: 2328  Rate: 67  Rhythm: normal sinus rhythm  Axis: normal  Intervals:none  ST&T Change: No ST segment elevation or depression. No abnormal T-wave inversion.  ____________________________________________  RADIOLOGY  No acute finding on the chest or head imaging.   ____________________________________________   PROCEDURES  Procedure(s) performed:   Procedures  Critical Care performed:   ____________________________________________   INITIAL IMPRESSION / ASSESSMENT AND PLAN / ED COURSE  Pertinent labs & imaging results that were available during my care of the patient were reviewed by me and considered in my medical decision making (see chart for details).  ----------------------------------------- 11:47 PM on 02/28/2017 -----------------------------------------   Pending lab results. Signed out to Dr. Manson Passey. Possible etiologies include stroke versus overdose of Flexeril. Low back pain without radiating symptoms lower extremities. No complaints of loss of bowel or bladder continence. Unlikely to be spinal compression.     ____________________________________________   FINAL CLINICAL IMPRESSION(S) / ED DIAGNOSES  Slurred speech. Low back  pain.    NEW MEDICATIONS STARTED DURING THIS VISIT:  New Prescriptions   No medications on file     Note:  This document was prepared using Dragon voice recognition software and may include unintentional dictation errors.     Myrna Blazer, MD 02/28/17 [phone]

## 2017-03-01 ENCOUNTER — Observation Stay: Payer: Medicare HMO

## 2017-03-01 ENCOUNTER — Observation Stay
Admit: 2017-03-01 | Discharge: 2017-03-01 | Disposition: A | Discharge status: Home / Self Care | Payer: Medicare HMO | Attending: Internal Medicine | Admitting: Internal Medicine

## 2017-03-01 DIAGNOSIS — I6623 Occlusion and stenosis of bilateral posterior cerebral arteries: Secondary | ICD-10-CM | POA: Diagnosis not present

## 2017-03-01 DIAGNOSIS — I10 Essential (primary) hypertension: Secondary | ICD-10-CM | POA: Diagnosis not present

## 2017-03-01 DIAGNOSIS — I6622 Occlusion and stenosis of left posterior cerebral artery: Secondary | ICD-10-CM | POA: Diagnosis not present

## 2017-03-01 DIAGNOSIS — I6523 Occlusion and stenosis of bilateral carotid arteries: Secondary | ICD-10-CM | POA: Diagnosis not present

## 2017-03-01 DIAGNOSIS — I6621 Occlusion and stenosis of right posterior cerebral artery: Secondary | ICD-10-CM | POA: Diagnosis not present

## 2017-03-01 DIAGNOSIS — R4781 Slurred speech: Secondary | ICD-10-CM | POA: Diagnosis not present

## 2017-03-01 DIAGNOSIS — Z79899 Other long term (current) drug therapy: Secondary | ICD-10-CM | POA: Diagnosis not present

## 2017-03-01 DIAGNOSIS — Z720 Tobacco use: Secondary | ICD-10-CM | POA: Diagnosis not present

## 2017-03-01 DIAGNOSIS — G459 Transient cerebral ischemic attack, unspecified: Secondary | ICD-10-CM | POA: Diagnosis not present

## 2017-03-01 LAB — URINALYSIS, COMPLETE (UACMP) WITH MICROSCOPIC
Bacteria, UA: NONE SEEN
Bilirubin Urine: NEGATIVE
Glucose, UA: NEGATIVE mg/dL
Ketones, ur: NEGATIVE mg/dL
Leukocytes, UA: NEGATIVE
Nitrite: NEGATIVE
Protein, ur: NEGATIVE mg/dL
Specific Gravity, Urine: 1.02 (ref 1.005–1.030)
pH: 6.5 (ref 5.0–8.0)

## 2017-03-01 LAB — URINE DRUG SCREEN, QUALITATIVE (ARMC ONLY)
Amphetamines, Ur Screen: NOT DETECTED
BARBITURATES, UR SCREEN: NOT DETECTED
BENZODIAZEPINE, UR SCRN: POSITIVE — AB
COCAINE METABOLITE, UR ~~LOC~~: NOT DETECTED
Cannabinoid 50 Ng, Ur ~~LOC~~: NOT DETECTED
MDMA (Ecstasy)Ur Screen: NOT DETECTED
METHADONE SCREEN, URINE: NOT DETECTED
Opiate, Ur Screen: NOT DETECTED
Phencyclidine (PCP) Ur S: NOT DETECTED
TRICYCLIC, UR SCREEN: POSITIVE — AB

## 2017-03-01 LAB — ECHOCARDIOGRAM COMPLETE
Height: 60 in
Weight: 2124.8 oz

## 2017-03-01 LAB — LIPID PANEL
Cholesterol: 228 mg/dL — ABNORMAL HIGH (ref 0–200)
HDL: 48 mg/dL (ref >40)
LDL Cholesterol: 156 mg/dL — ABNORMAL HIGH (ref 0–99)
Total CHOL/HDL Ratio: 4.8 RATIO
Triglycerides: 122 mg/dL (ref <150)
VLDL: 24 mg/dL (ref 0–40)

## 2017-03-01 MED ORDER — ISOSORBIDE MONONITRATE ER 30 MG PO TB24
30.0000 mg | ORAL_TABLET | Freq: Every day | ORAL | Status: DC
  Administered 2017-03-01: 30 mg via ORAL
  Filled 2017-03-01: qty 1

## 2017-03-01 MED ORDER — ALBUTEROL SULFATE (2.5 MG/3ML) 0.083% IN NEBU: 2.5000 mg | INHALATION_SOLUTION | RESPIRATORY_TRACT | Status: DC | PRN

## 2017-03-01 MED ORDER — SENNOSIDES-DOCUSATE SODIUM 8.6-50 MG PO TABS: 1.0000 | ORAL_TABLET | Freq: Every evening | ORAL | Status: DC | PRN

## 2017-03-01 MED ORDER — ENALAPRIL MALEATE 5 MG PO TABS
5.0000 mg | ORAL_TABLET | Freq: Two times a day (BID) | ORAL | Status: DC
  Administered 2017-03-01: 12:00:00 5 mg via ORAL
  Filled 2017-03-01 (×2): qty 1

## 2017-03-01 MED ORDER — AMLODIPINE BESYLATE 5 MG PO TABS
5.0000 mg | ORAL_TABLET | Freq: Every day | ORAL | Status: DC
Start: 2017-03-01 — End: 2017-03-01
  Administered 2017-03-01: 5 mg via ORAL
  Filled 2017-03-01: qty 1

## 2017-03-01 MED ORDER — STROKE: EARLY STAGES OF RECOVERY BOOK
Freq: Once | Status: AC
  Administered 2017-03-01: 04:00:00

## 2017-03-01 MED ORDER — METOPROLOL SUCCINATE ER 25 MG PO TB24
25.0000 mg | ORAL_TABLET | Freq: Every day | ORAL | Status: DC
  Administered 2017-03-01: 25 mg via ORAL
  Filled 2017-03-01: qty 1

## 2017-03-01 MED ORDER — ACETAMINOPHEN 650 MG RE SUPP: 650.0000 mg | RECTAL | Status: DC | PRN

## 2017-03-01 MED ORDER — NICOTINE 21 MG/24HR TD PT24
21.0000 mg | MEDICATED_PATCH | Freq: Every day | TRANSDERMAL | Status: DC
  Administered 2017-03-01: 21 mg via TRANSDERMAL
  Filled 2017-03-01: qty 1

## 2017-03-01 MED ORDER — ACETAMINOPHEN 160 MG/5ML PO SOLN: 650.0000 mg | ORAL | Status: DC | PRN

## 2017-03-01 MED ORDER — OMEGA-3-ACID ETHYL ESTERS 1 G PO CAPS
2.0000 | ORAL_CAPSULE | Freq: Two times a day (BID) | ORAL | Status: DC
  Administered 2017-03-01: 2 g via ORAL
  Filled 2017-03-01: qty 2

## 2017-03-01 MED ORDER — ASPIRIN EC 81 MG PO TBEC
81.0000 mg | DELAYED_RELEASE_TABLET | Freq: Every day | ORAL | Status: DC
  Administered 2017-03-01: 81 mg via ORAL
  Filled 2017-03-01: qty 1

## 2017-03-01 MED ORDER — KETOROLAC TROMETHAMINE 30 MG/ML IJ SOLN
30.0000 mg | Freq: Once | INTRAMUSCULAR | Status: AC
  Administered 2017-03-01: 30 mg via INTRAVENOUS
  Filled 2017-03-01: qty 1

## 2017-03-01 MED ORDER — ACETAMINOPHEN 325 MG PO TABS: 650.0000 mg | ORAL_TABLET | ORAL | Status: DC | PRN

## 2017-03-01 MED ORDER — CYCLOBENZAPRINE HCL 10 MG PO TABS
10.0000 mg | ORAL_TABLET | Freq: Three times a day (TID) | ORAL | Status: DC | PRN
  Administered 2017-03-01 (×2): 10 mg via ORAL
  Filled 2017-03-01 (×2): qty 1

## 2017-03-01 MED ORDER — ENOXAPARIN SODIUM 40 MG/0.4ML ~~LOC~~ SOLN
40.0000 mg | SUBCUTANEOUS | Status: DC
  Administered 2017-03-01: 05:00:00 40 mg via SUBCUTANEOUS
  Filled 2017-03-01: qty 0.4

## 2017-03-01 MED ORDER — NITROGLYCERIN 0.4 MG SL SUBL: 0.4000 mg | SUBLINGUAL_TABLET | SUBLINGUAL | Status: DC | PRN

## 2017-03-01 MED ORDER — SODIUM CHLORIDE 0.9 % IV SOLN
INTRAVENOUS | Status: DC
  Administered 2017-03-01: 05:00:00 via INTRAVENOUS

## 2017-03-01 MED ORDER — MONTELUKAST SODIUM 10 MG PO TABS: 10.0000 mg | ORAL_TABLET | Freq: Every day | ORAL | Status: DC

## 2017-03-01 NOTE — ED Notes (Signed)
Son in Social workerlaw, Caryn BeeKevin leaving.  Phone number [phone].

## 2017-03-01 NOTE — Progress Notes (Signed)
[employer] - [place] [facility]   PATIENT NAME: Erica Stevens    MR#:  [id number]  DATE OF BIRTH:  1950/11/14  SUBJECTIVE:   Pt. Here due to slurred speech Suspicious for CVA. Slurred speech has not resolved. No other focal neurologic complaints.  REVIEW OF SYSTEMS:    Review of Systems  Constitutional: Negative for chills and fever.  HENT: Negative for congestion and tinnitus.   Eyes: Negative for blurred vision and double vision.  Respiratory: Negative for cough, shortness of breath and wheezing.   Cardiovascular: Negative for chest pain, orthopnea and PND.  Gastrointestinal: Negative for abdominal pain, diarrhea, nausea and vomiting.  Genitourinary: Negative for dysuria and hematuria.  Neurological: Negative for dizziness, sensory change and focal weakness.  All other systems reviewed and are negative.   Nutrition: Heart healthy Tolerating Diet: Yes Tolerating PT: Await Eval   DRUG ALLERGIES:   Allergies  Allergen Reactions  . Erythromycin   . Ivp Dye [Iodinated Diagnostic Agents]   . Sulfa Antibiotics     Stated turns orange  . Sulfur     VITALS:  Blood pressure (!) 178/69, pulse 65, temperature 98 F (36.7 C), temperature source Oral, resp. rate 16, height 5' (1.524 m), weight 60.2 kg (132 lb 12.8 oz), SpO2 97 %.  PHYSICAL EXAMINATION:   Physical Exam  GENERAL:  66 y.o.-year-old patient lying in bed in no acute distress.  EYES: Pupils equal, round, reactive to light and accommodation. No scleral icterus. Extraocular muscles intact.  HEENT: Head atraumatic, normocephalic. Oropharynx and nasopharynx clear.  NECK:  Supple, no jugular venous distention. No thyroid enlargement, no tenderness.  LUNGS: Normal breath sounds bilaterally, no wheezing, rales, rhonchi. No use of accessory muscles of respiration.  CARDIOVASCULAR: S1, S2 normal. No murmurs, rubs, or gallops.  ABDOMEN: Soft, nontender, nondistended. Bowel sounds present. No  organomegaly or mass.  EXTREMITIES: No cyanosis, clubbing or edema b/l.    NEUROLOGIC: Cranial nerves II through XII are intact. No focal Motor or sensory deficits b/l.   PSYCHIATRIC: The patient is alert and oriented x 3.  SKIN: No obvious rash, lesion, or ulcer.    LABORATORY PANEL:   CBC  Recent Labs Lab 02/28/17 2311  WBC 8.0  HGB 14.2  HCT 41.3  PLT 233   ------------------------------------------------------------------------------------------------------------------  Chemistries   Recent Labs Lab 02/28/17 2311  NA 140  K 3.8  CL 103  CO2 29  GLUCOSE 97  BUN 13  CREATININE 0.87  CALCIUM 9.2   ------------------------------------------------------------------------------------------------------------------  Cardiac Enzymes  Recent Labs Lab 02/28/17 2311  TROPONINI <0.03   ------------------------------------------------------------------------------------------------------------------  RADIOLOGY:  Dg Chest 1 View  Result Date: 02/28/2017 CLINICAL DATA:  66 y/o  F; slurred speech. EXAM: CHEST 1 VIEW COMPARISON:  02/25/2017 chest radiograph. FINDINGS: Stable cardiac silhouette. Post median sternotomy with wires in alignment. Right upper quadrant cholecystectomy clips. Aortic atherosclerosis with calcification. Clear lungs. No pleural effusion or pneumothorax. Bones are unremarkable. IMPRESSION: No active disease. Electronically Signed   By: Mitzi Hansen M.D.   On: 02/28/2017 22:52   Ct Head Wo Contrast  Result Date: 02/28/2017 CLINICAL DATA:  Status post fall, with lethargy and slurred speech, acute onset. Concern for head injury. Initial encounter. EXAM: CT HEAD WITHOUT CONTRAST TECHNIQUE: Contiguous axial images were obtained from the base of the skull through the vertex without intravenous contrast. COMPARISON:  CT of the head performed 01/12/2017 FINDINGS: Brain: No evidence of acute infarction, hemorrhage, hydrocephalus, extra-axial collection  or mass lesion/mass effect. Prominence  of the ventricles and sulci reflects moderate cortical volume loss. Mild subcortical white matter change likely reflects small vessel ischemic microangiopathy. The brainstem and fourth ventricle are within normal limits. The basal ganglia are unremarkable in appearance. The cerebral hemispheres demonstrate grossly normal gray-white differentiation. No mass effect or midline shift is seen. Vascular: No hyperdense vessel or unexpected calcification. Skull: There is no evidence of fracture; visualized osseous structures are unremarkable in appearance. Sinuses/Orbits: The orbits are within normal limits. The paranasal sinuses and mastoid air cells are well-aerated. Other: No significant soft tissue abnormalities are seen. IMPRESSION: 1. No evidence of traumatic intracranial injury or fracture. 2. Moderate cortical volume loss and mild small vessel ischemic microangiopathy. Electronically Signed   By: Roanna Raider M.D.   On: 02/28/2017 23:06   Mr Brain Wo Contrast  Result Date: 03/01/2017 CLINICAL DATA:  Slurred speech EXAM: MRI HEAD WITHOUT CONTRAST MRA HEAD WITHOUT CONTRAST TECHNIQUE: Multiplanar, multiecho pulse sequences of the brain and surrounding structures were obtained without intravenous contrast. Angiographic images of the head were obtained using MRA technique without contrast. COMPARISON:  CT head 02/28/2017 FINDINGS: MRI HEAD FINDINGS Brain: Negative for acute infarct. Minimal chronic white matter changes. Ventricular enlargement due to atrophy. Negative for hemorrhage or mass. No fluid collection. Vascular: Normal flow voids Skull and upper cervical spine: Negative Sinuses/Orbits: Negative Other: None MRA HEAD FINDINGS Both vertebral arteries patent to the basilar. PICA patent bilaterally. Basilar patent. Occlusion of the proximal right posterior cerebral artery. Severe stenosis left posterior cerebral artery Atherosclerotic irregularity of the cavernous carotid  bilaterally. Mild stenosis on the right. Anterior and middle cerebral arteries patent. Multiple areas of moderate stenosis in left middle cerebral artery branches. Negative for cerebral aneurysm. IMPRESSION: Negative for acute infarct. 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 Multiple areas of stenosis in left middle cerebral artery branches. Electronically Signed   By: Marlan Palau M.D.   On: 03/01/2017 10:23   US Carotid Bilateral (at [facility] Only)  Result Date: 03/01/2017 CLINICAL DATA:  Slurred speech EXAM: BILATERAL CAROTID DUPLEX ULTRASOUND TECHNIQUE: Wallace Cullens scale imaging, color Doppler and duplex ultrasound were performed of bilateral carotid and vertebral arteries in the neck. COMPARISON:  None. FINDINGS: Criteria: Quantification of carotid stenosis is based on velocity parameters that correlate the residual internal carotid diameter with NASCET-based stenosis levels, using the diameter of the distal internal carotid lumen as the denominator for stenosis measurement. The following velocity measurements were obtained: RIGHT ICA:  128 cm/sec CCA:  64 cm/sec SYSTOLIC ICA/CCA RATIO:  2.0 DIASTOLIC ICA/CCA RATIO:  1.2 ECA:  119 cm/sec LEFT ICA:  113 cm/sec CCA:  73 cm/sec SYSTOLIC ICA/CCA RATIO:  1.6 DIASTOLIC ICA/CCA RATIO:  1.8 ECA:  143 cm/sec RIGHT CAROTID ARTERY: Mild irregular mixed plaque in the bulb. Low resistance internal carotid Doppler pattern. RIGHT VERTEBRAL ARTERY:  Antegrade. LEFT CAROTID ARTERY: Moderate irregular calcified plaque in the bulb. Low resistance internal carotid Doppler pattern. LEFT VERTEBRAL ARTERY:  Antegrade. IMPRESSION: Less than 50% stenosis in the right internal carotid artery. 50-69% stenosis in the left internal carotid artery. Electronically Signed   By: Jolaine Click M.D.   On: 03/01/2017 11:58   Mr Maxine Glenn Head/brain ZO Cm  Result Date:  03/01/2017 CLINICAL DATA:  Slurred speech EXAM: MRI HEAD WITHOUT CONTRAST MRA HEAD WITHOUT CONTRAST TECHNIQUE: Multiplanar, multiecho pulse sequences of the brain and surrounding structures were obtained without intravenous contrast. Angiographic images  of the head were obtained using MRA technique without contrast. COMPARISON:  CT head 02/28/2017 FINDINGS: MRI HEAD FINDINGS Brain: Negative for acute infarct. Minimal chronic white matter changes. Ventricular enlargement due to atrophy. Negative for hemorrhage or mass. No fluid collection. Vascular: Normal flow voids Skull and upper cervical spine: Negative Sinuses/Orbits: Negative Other: None MRA HEAD FINDINGS Both vertebral arteries patent to the basilar. PICA patent bilaterally. Basilar patent. Occlusion of the proximal right posterior cerebral artery. Severe stenosis left posterior cerebral artery Atherosclerotic irregularity of the cavernous carotid bilaterally. Mild stenosis on the right. Anterior and middle cerebral arteries patent. Multiple areas of moderate stenosis in left middle cerebral artery branches. Negative for cerebral aneurysm. IMPRESSION: Negative for acute infarct. 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 Multiple areas of stenosis in left middle cerebral artery branches. Electronically Signed   By: Marlan Palauharles  Clark M.D.   On: 03/01/2017 10:23     ASSESSMENT AND PLAN:   66 year old female with past medical history of chronic pain syndrome, chronic kidney disease stage I, hypertension, depression, history of migraines, hyperlipidemia, GERD who presented to the hospital due to slurred speech.  1. Slurred speech/TIA-this was a working diagnosis given the patient's transient neurologic symptoms. -Patient also was taking increasing doses of Flexeril due to a recent fall and compression fracture which was contributing to her slurred  speech likely. -Patient has undergone extensive workup and CT head, MRI brain, MRA of the brain which has showed no evidence of acute stroke. She does have some intracranial stenosis but no acute stroke noted. She is clinically asymptomatic and back to baseline. -Continue aspirin, statin. Await physical therapy evaluation.  2. Essential hypertension-continue Toprol, Imdur, enalapril, Norvasc.  3. Hyperlipidemia-continue atorvastatin.  4. Tobacco abuse-continue nicotine patch.  5. Back pain status post recent fall-continue Flexeril as needed, Motrin as needed for pain.  DC home possibly on her today after physical therapy evaluation.  All the records are reviewed and case discussed with Care Management/Social Worker. Management plans discussed with the patient, family and they are in agreement.  CODE STATUS: Full code  DVT Prophylaxis: Lovenox  TOTAL TIME TAKING CARE OF THIS PATIENT: 30 minutes.   POSSIBLE D/C IN 1-2 DAYS, DEPENDING ON CLINICAL CONDITION.   Houston SirenSAINANI,Miran Kautzman J M.D on 03/01/2017 at 12:54 PM  Between 7am to 6pm - Pager - [phone]  After 6pm go to [email or web address] - Therapist, nutritionalpassword EPAS [facility]  [employer] [place] [facility]  Office  [phone]  CC: [facility] care physician; [facility] [place]

## 2017-03-01 NOTE — ED Provider Notes (Signed)
I assume care of the patient from Dr. Langston MaskerShaevitz at 11:00 PM. Patient with continued slurred speech and confusion during my evaluation. CT scan revealed no evidence of acute intracranial pathology and a such patient discussed with Dr. Anne HahnWillis for hospital admission for further evaluation and management.   Darci CurrentBrown, [place], MD 03/01/17 [phone]

## 2017-03-01 NOTE — Progress Notes (Signed)
Pt is being discharged today, discharge instructions reviewed with the patient after she finished with several phone calls. She verified understanding. Speech still slurred however. 0 paper prescriptions were given to her. All belongings packed and returned to the patient, she will be rolled out in a wheelchair by staff.

## 2017-03-01 NOTE — Evaluation (Signed)
Physical Therapy Evaluation Patient Details Name: Erica LaundrySusan Gray Stevens MRN: [id number] DOB: October 12, 1950 Today's Date: 03/01/2017   History of Present Illness  66yo female pt admitted for slurred speech. PMHx significant for CKD, CHF, chronic pain syndrome, depression, HTN, polysubstance abuse, CAD w/ CABG, and recent T12 compression fx. Work up for stroke negative based on CT/MRI.    Clinical Impression  Pt admitted with above diagnosis. Pt currently with functional limitations due to the deficits listed below (see PT Problem List). Ms. Erica Stevens presents with 10/10 back pain following falls 2 days prior.  She was instructed in log roll and requires min guard>min assist for bed mobility.  She requires min guard for transfers and ambulation and min assist when ascending/descending steps.  She will have 24/7 assist/supervision from her cousin whom she lives with.  Her daughter will be assisting her into the home at d/c. Pt will benefit from skilled PT to increase their independence and safety with mobility to allow discharge to the venue listed below.      Follow Up Recommendations Home health PT;Supervision for mobility/OOB    Equipment Recommendations  None recommended by PT    Recommendations for Other Services       Precautions / Restrictions Precautions Precautions: Fall Restrictions Weight Bearing Restrictions: No      Mobility  Bed Mobility Overal bed mobility: Needs Assistance Bed Mobility: Supine to Sit;Sit to Supine     Supine to sit: Min assist Sit to supine: Min guard   General bed mobility comments: Instructed pt in log roll technique, pt required min assist to elevate trunk for supine>sit and min guard to guide pt for sit>supine.  HOB flat with no rail to simulate home environment.  Transfers Overall transfer level: Needs assistance Equipment used: Rolling walker (2 wheeled) Transfers: Sit to/from Stand Sit to Stand: Min guard         General transfer comment: Pt  stands slowly due to pain and cues for hand placement using RW.    Ambulation/Gait Ambulation/Gait assistance: Min guard Ambulation Distance (Feet): 100 Feet Assistive device: Rolling walker (2 wheeled) Gait Pattern/deviations: Step-through pattern;Decreased stride length;Trunk flexed Gait velocity: decreased Gait velocity interpretation: Below normal speed for age/gender General Gait Details: Cues for upright posture and min guard for safety.  Pt with guarded posture but with steady gait using RW.  Stairs Stairs: Yes Stairs assistance: Min assist Stair Management: One rail Right;Step to pattern;Forwards Number of Stairs: 2 General stair comments: 1 person HHA and use of R rail.  Pt instructed that she must have assist when navigating 2 steps to enter home.   Wheelchair Mobility    Modified Rankin (Stroke Patients Only)       Balance Overall balance assessment: Needs assistance;History of Falls Sitting-balance support: No upper extremity supported;Feet supported Sitting balance-Leahy Scale: Good     Standing balance support: Bilateral upper extremity supported;During functional activity Standing balance-Leahy Scale: Fair Standing balance comment: Pt able to stand statically without UE suport but requires UE support for dynamic activities                             Pertinent Vitals/Pain Pain Assessment: 0-10 Pain Score: 10-Worst pain ever Pain Location: back, worse with movement Pain Descriptors / Indicators: Grimacing;Aching Pain Intervention(s): Limited activity within patient's tolerance;Monitored during session    Home Living Family/patient expects to be discharged to:: Private residence Living Arrangements: Other relatives (cousin) Available Help at Discharge: Family;Available PRN/intermittently  Type of Home: House Home Access: Stairs to enter Entrance Stairs-Rails: Right Entrance Stairs-Number of Steps: 2 Home Layout: One level Home Equipment:  Cane - single point;Wheelchair - [employer] - 2 wheels Additional Comments: Lives with cousin, daughter lives 3 blocks away.     Prior Function Level of Independence: Needs assistance   Gait / Transfers Assistance Needed: indep with functional mobility using SPC in and outside of home  ADL's / Homemaking Assistance Needed: cousin assisted with household tasks as needed, pt indep with ADL, mother drives pt, endorses "sometimes forget" to take meds  Comments: endorses 2 falls in past 12 months (including fall that led to this admission)     Hand Dominance        Extremity/Trunk Assessment   Upper Extremity Assessment Upper Extremity Assessment: Defer to OT evaluation    Lower Extremity Assessment Lower Extremity Assessment: RLE deficits/detail;LLE deficits/detail RLE Deficits / Details: Strength grossly 4/5, pt reports back pain with strength testing of hips and knees Bil LLE Deficits / Details: Strength grossly 4/5, pt reports back pain with strength testing of hips and knees Bil    Cervical / Trunk Assessment Cervical / Trunk Assessment: Kyphotic;Other exceptions Cervical / Trunk Exceptions: back pain  Communication   Communication: No difficulties  Cognition Arousal/Alertness: Awake/alert Behavior During Therapy: WFL for tasks assessed/performed Overall Cognitive Status: Within Functional Limits for tasks assessed                                        General Comments General comments (skin integrity, edema, etc.): Instructed pt to be up and taking short walks around her home with supervision every 45 minutes or so to prevent her back from becoming too stiff and painful.    Exercises Other Exercises Other Exercises: pt educated in medication management to support adherence and minimize noncompliance, pt verbalized understanding and plan to obtain a pill organizer to help manage meds at home Other Exercises: pt educated in energy conservation and falls  prevention strategies including proper footwear, body and environmental positioning to minimize LOB/falls; pt verbalized understanding   Assessment/Plan    PT Assessment Patient needs continued PT services  PT Problem List Decreased strength;Decreased activity tolerance;Decreased balance;Decreased mobility;Pain;Decreased knowledge of use of DME;Decreased safety awareness       PT Treatment Interventions DME instruction;Gait training;Stair training;Functional mobility training;Therapeutic activities;Therapeutic exercise;Balance training;Patient/family education;Modalities    PT Goals (Current goals can be found in the Care Plan section)  Acute Rehab PT Goals Patient Stated Goal: decreased pain PT Goal Formulation: With patient Time For Goal Achievement: 03/15/17 Potential to Achieve Goals: Good    Frequency Min 2X/week   Barriers to discharge        Co-evaluation               AM-PAC PT "6 Clicks" Daily Activity  Outcome Measure Difficulty turning over in bed (including adjusting bedclothes, sheets and blankets)?: A Little Difficulty moving from lying on back to sitting on the side of the bed? : Total Difficulty sitting down on and standing up from a chair with arms (e.g., wheelchair, bedside commode, etc,.)?: A Little Help needed moving to and from a bed to chair (including a wheelchair)?: A Little Help needed walking in hospital room?: A Little Help needed climbing 3-5 steps with a railing? : A Little 6 Click Score: 16    End of Session Equipment Utilized During Treatment:  Gait belt Activity Tolerance: Patient limited by pain;Patient tolerated treatment well Patient left: in bed;with call bell/phone within reach;with bed alarm set Nurse Communication: Mobility status PT Visit Diagnosis: Pain;History of falling (Z91.81);Unsteadiness on feet (R26.81) Pain - Right/Left: Left Pain - part of body:  (low back)    Time: 1610-9604 PT Time Calculation (min) (ACUTE ONLY):  39 min   Charges:   PT Evaluation $PT Eval Low Complexity: 1 Procedure PT Treatments $Gait Training: 8-22 mins $Therapeutic Activity: 8-22 mins   PT G Codes:   PT G-Codes **NOT FOR INPATIENT CLASS** Functional Assessment Tool Used: Clinical judgement;AM-PAC 6 Clicks Basic Mobility Functional Limitation: Mobility: Walking and moving around Mobility: Walking and Moving Around Current Status (V4098): At least 40 percent but less than 60 percent impaired, limited or restricted Mobility: Walking and Moving Around Goal Status [phone]): At least 20 percent but less than 40 percent impaired, limited or restricted    Encarnacion Chu PT, DPT 03/01/2017, 5:40 PM

## 2017-03-01 NOTE — Care Management (Signed)
Admitted to this facility under observation status with the diagnosis of slurred speech. Cousin is living in the home at present. Daughter is Sheron NightingaleCarrie [name] [phone]). [facility] a [facility] and seen Dr., Sallee LangeSoles. [facility] years [facility]. [facility] the [facility]. No home oxygen. Wheelchair, cane and rolling walker in the home. Takes care of all basic activities of daily living herself, doesn't drive. Larey SeatFell prior to this admission. Good appetite. Daughter will transport. Gwenette GreetBrenda S Tabatha Razzano RN MSN CCM Care Management [phone]

## 2017-03-01 NOTE — Care Management Obs Status (Signed)
MEDICARE OBSERVATION STATUS NOTIFICATION   Patient Details  Name: Erica Stevens MRN: [id number] Date of Birth: November 04, 1950   Medicare Observation Status Notification Given:  Yes    Gwenette GreetBrenda S Keita Demarco, RN 03/01/2017, 12:24 PM

## 2017-03-01 NOTE — H&P (Signed)
Erica Stevens is an 66 y.o. female.   Chief Complaint: Low back pain HPI: The patient with past medical history of polysubstance abuse, congestive heart failure, hypertension and depression presents to the emergency department complaining of low back pain. She reports a fall 2 days ago at which time she may have suffered a compression fracture. Today she is also concerned about slurred speech. She reported to ED staff that her mother reports that the patient's speech was normal on the phone last night. Notably the patient admits to taking multiple Flexeril tablets today. She continued to have slurred speech in the emergency department which prompted the emergency department staff to call the hospitalist service for admission.  Past Medical History:  Diagnosis Date  . Allergic rhinitis due to pollen   . Anxiety   . Brachial neuritis or radiculitis NOS   . Cellulitis and abscess of unspecified site   . Chronic kidney disease, stage I   . Chronic pain syndrome   . Congestive heart failure, unspecified   . Coronary atherosclerosis of unspecified type of vessel, native or graft   . Degeneration of cervical intervertebral disc   . Depression   . Hypertension   . Migraine, unspecified, without mention of intractable migraine without mention of status migrainosus   . 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 Osteoporosis, unspecified   . Pure hypercholesterolemia   . Reflux esophagitis   . Thoracic or lumbosacral neuritis or radiculitis, unspecified   . Unspecified urinary incontinence     Past Surgical History:  Procedure Laterality Date  . ABDOMINAL HYSTERECTOMY    . BLADDER SURGERY    . BYPASS GRAFT    . CARDIAC CATHETERIZATION  2014   [facility]  . CHOLECYSTECTOMY    . COLONOSCOPY    . CORONARY ARTERY BYPASS GRAFT     x3 grafts;[facility]  . fasciotomy      Family History  Problem Relation Age of Onset  . Hypertension Mother   . Hyperlipidemia Mother   . Heart disease Mother    Social  History:  reports that she has been smoking Cigarettes.  She has been smoking about 1.00 pack per day. She has never used smokeless tobacco. She reports that she does not drink alcohol or use drugs.  Allergies:  Allergies  Allergen Reactions  . Erythromycin   . Ivp Dye [Iodinated Diagnostic Agents]   . Sulfa Antibiotics     Stated turns orange  . Sulfur     Medications Prior to Admission  Medication Sig Dispense Refill  . albuterol (PROVENTIL HFA;VENTOLIN HFA) 108 (90 Base) MCG/ACT inhaler Inhale 2 puffs into the lungs every 6 (six) hours as needed for wheezing or shortness of breath. 1 Inhaler 2  . amLODipine (NORVASC) 5 MG tablet Take 1 tablet (5 mg total) by mouth daily. 30 tablet 0  . aspirin 81 MG tablet Take 81 mg by mouth daily.    . cyclobenzaprine (FLEXERIL) 10 MG tablet Take 1 tablet (10 mg total) by mouth 3 (three) times daily as needed for muscle spasms. 30 tablet 0  . enalapril (VASOTEC) 5 MG tablet Take 1 tablet (5 mg total) by mouth 2 (two) times daily. 30 tablet 0  . ibuprofen (ADVIL,MOTRIN) 600 MG tablet Take 1 tablet (600 mg total) by mouth every 8 (eight) hours as needed. 30 tablet 0  . isosorbide mononitrate (IMDUR) 30 MG 24 hr tablet TAKE ONE (1) TABLET EACH DAY 30 tablet 0  . metoprolol succinate (TOPROL-XL) 25 MG 24 hr  tablet TAKE ONE (1) TABLET EACH DAY 30 tablet 5  . atorvastatin (LIPITOR) 40 MG tablet Take 1 tablet (40 mg total) by mouth daily. (Patient not taking: Reported on 01/13/2017) 30 tablet 5  . mirtazapine (REMERON) 15 MG tablet Take 1 tablet (15 mg total) by mouth at bedtime. (Patient not taking: Reported on 01/13/2017) 30 tablet 0  . montelukast (SINGULAIR) 10 MG tablet Take 1 tablet (10 mg total) by mouth at bedtime. (Patient not taking: Reported on 01/13/2017) 30 tablet 11  . Naloxone HCl 0.4 MG/0.4ML SOAJ Follow package instructions for heroin or opioid overdose. 1 Package 1  . nicotine (NICODERM CQ - DOSED IN MG/24 HOURS) 21 mg/24hr patch Place 1 patch (21  mg total) onto the skin daily. (Patient not taking: Reported on 01/13/2017) 28 patch 0  . nitroGLYCERIN (NITROLINGUAL) 0.4 MG/SPRAY spray AS DIRECTED 4.9 g 11  . omega-3 acid ethyl esters (LOVAZA) 1 G capsule TAKE TWO CAPSULES BY MOUTH TWICE DAILY. (Patient not taking: Reported on 01/13/2017) 120 capsule 11    Results for orders placed or performed during the hospital encounter of 02/28/17 (from the past 48 hour(s))  CBC with Differential     Status: Abnormal   Collection Time: 02/28/17 11:11 PM  Result Value Ref Range   WBC 8.0 3.6 - 11.0 K/uL   RBC 4.30 3.80 - 5.20 MIL/uL   Hemoglobin 14.2 12.0 - 16.0 g/dL   HCT 41.3 35.0 - 47.0 %   MCV 96.2 80.0 - 100.0 fL   MCH 33.1 26.0 - 34.0 pg   MCHC 34.4 32.0 - 36.0 g/dL   RDW 14.4 11.5 - 14.5 %   Platelets 233 150 - 440 K/uL   Neutrophils Relative % 43 %   Neutro Abs 3.4 1.4 - 6.5 K/uL   Lymphocytes Relative 46 %   Lymphs Abs 3.7 (H) 1.0 - 3.6 K/uL   Monocytes Relative 7 %   Monocytes Absolute 0.6 0.2 - 0.9 K/uL   Eosinophils Relative 4 %   Eosinophils Absolute 0.3 0 - 0.7 K/uL   Basophils Relative 0 %   Basophils Absolute 0.0 0 - 0.1 K/uL  Basic metabolic panel     Status: None   Collection Time: 02/28/17 11:11 PM  Result Value Ref Range   Sodium 140 135 - 145 mmol/L   Potassium 3.8 3.5 - 5.1 mmol/L   Chloride 103 101 - 111 mmol/L   CO2 29 22 - 32 mmol/L   Glucose, Bld 97 65 - 99 mg/dL   BUN 13 6 - 20 mg/dL   Creatinine, Ser 0.87 0.44 - 1.00 mg/dL   Calcium 9.2 8.9 - 10.3 mg/dL   GFR calc non Af Amer >60 >60 mL/min   GFR calc Af Amer >60 >60 mL/min    Comment: (NOTE) The eGFR has been calculated using the CKD EPI equation. This calculation has not been validated in all clinical situations. eGFR's persistently <60 mL/min signify possible Chronic Kidney Disease.    Anion gap 8 5 - 15  Troponin I     Status: None   Collection Time: 02/28/17 11:11 PM  Result Value Ref Range   Troponin I <0.03 <0.03 ng/mL  Ethanol     Status:  None   Collection Time: 02/28/17 11:11 PM  Result Value Ref Range   Alcohol, Ethyl (B) <5 <5 mg/dL    Comment:        LOWEST DETECTABLE LIMIT FOR SERUM ALCOHOL IS 5 mg/dL FOR MEDICAL PURPOSES ONLY  Urinalysis, Complete w Microscopic     Status: Abnormal   Collection Time: 02/28/17 11:14 PM  Result Value Ref Range   Color, Urine YELLOW YELLOW   APPearance HAZY (A) CLEAR   Specific Gravity, Urine 1.020 1.005 - 1.030   pH 6.5 5.0 - 8.0   Glucose, UA NEGATIVE NEGATIVE mg/dL   Hgb urine dipstick SMALL (A) NEGATIVE   Bilirubin Urine NEGATIVE NEGATIVE   Ketones, ur NEGATIVE NEGATIVE mg/dL   Protein, ur NEGATIVE NEGATIVE mg/dL   Nitrite NEGATIVE NEGATIVE   Leukocytes, UA NEGATIVE NEGATIVE   Squamous Epithelial / LPF 0-5 (A) NONE SEEN   WBC, UA 0-5 0 - 5 WBC/hpf   RBC / HPF 0-5 0 - 5 RBC/hpf   Bacteria, UA NONE SEEN NONE SEEN   Mucous PRESENT   Urine Drug Screen, Qualitative ([facility] only)     Status: Abnormal   Collection Time: 02/28/17 11:14 PM  Result Value Ref Range   Tricyclic, Ur Screen POSITIVE (A) NONE DETECTED   Amphetamines, Ur Screen NONE DETECTED NONE DETECTED   MDMA (Ecstasy)Ur Screen NONE DETECTED NONE DETECTED   Cocaine Metabolite,Ur [place] NONE DETECTED NONE DETECTED   Opiate, Ur Screen NONE DETECTED NONE DETECTED   Phencyclidine (PCP) Ur S NONE DETECTED NONE DETECTED   Cannabinoid 50 Ng, Ur [place] NONE DETECTED NONE DETECTED   Barbiturates, Ur Screen NONE DETECTED NONE DETECTED   Benzodiazepine, Ur Scrn POSITIVE (A) NONE DETECTED   Methadone Scn, Ur NONE DETECTED NONE DETECTED    Comment: (NOTE) 160  Tricyclics, urine               Cutoff 1000 ng/mL 200  Amphetamines, urine             Cutoff 1000 ng/mL 300  MDMA (Ecstasy), urine           Cutoff 500 ng/mL 400  Cocaine Metabolite, urine       Cutoff 300 ng/mL 500  Opiate, urine                   Cutoff 300 ng/mL 600  Phencyclidine (PCP), urine      Cutoff 25 ng/mL 700  Cannabinoid, urine              Cutoff 50 ng/mL 800   Barbiturates, urine             Cutoff 200 ng/mL 900  Benzodiazepine, urine           Cutoff 200 ng/mL 1000 Methadone, urine                Cutoff 300 ng/mL 1100 1200 The urine drug screen provides only a preliminary, unconfirmed 1300 analytical test result and should not be used for non-medical 1400 purposes. Clinical consideration and professional judgment should 1500 be applied to any positive drug screen result due to possible 1600 interfering substances. A more specific alternate chemical method 1700 must be used in order to obtain a confirmed analytical result.  1800 Gas chromato graphy / mass spectrometry (GC/MS) is the preferred 1900 confirmatory method.   Lipid panel     Status: Abnormal   Collection Time: 03/01/17  4:36 AM  Result Value Ref Range   Cholesterol 228 (H) 0 - 200 mg/dL   Triglycerides 122 <150 mg/dL   HDL 48 >40 mg/dL   Total CHOL/HDL Ratio 4.8 RATIO   VLDL 24 0 - 40 mg/dL   LDL Cholesterol 156 (H) 0 - 99 mg/dL    Comment:  Total Cholesterol/HDL:CHD Risk Coronary Heart Disease Risk Table                     Men   Women  1/2 Average Risk   3.4   3.3  Average Risk       5.0   4.4  2 X Average Risk   9.6   7.1  3 X Average Risk  23.4   11.0        Use the calculated Patient Ratio above and the CHD Risk Table to determine the patient's CHD Risk.        ATP III CLASSIFICATION (LDL):  <100     mg/dL   Optimal  100-129  mg/dL   Near or Above                    Optimal  130-159  mg/dL   Borderline  160-189  mg/dL   High  >190     mg/dL   Very High    Dg Chest 1 View  Result Date: 02/28/2017 CLINICAL DATA:  66 y/o  F; slurred speech. EXAM: CHEST 1 VIEW COMPARISON:  02/25/2017 chest radiograph. FINDINGS: Stable cardiac silhouette. Post median sternotomy with wires in alignment. Right upper quadrant cholecystectomy clips. Aortic atherosclerosis with calcification. Clear lungs. No pleural effusion or pneumothorax. Bones are unremarkable. IMPRESSION: No  active disease. Electronically Signed   By: Kristine Garbe M.D.   On: 02/28/2017 22:52   Ct Head Wo Contrast  Result Date: 02/28/2017 CLINICAL DATA:  Status post fall, with lethargy and slurred speech, acute onset. Concern for head injury. Initial encounter. EXAM: CT HEAD WITHOUT CONTRAST TECHNIQUE: Contiguous axial images were obtained from the base of the skull through the vertex without intravenous contrast. COMPARISON:  CT of the head performed 01/12/2017 FINDINGS: Brain: No evidence of acute infarction, hemorrhage, hydrocephalus, extra-axial collection or mass lesion/mass effect. Prominence of the ventricles and sulci reflects moderate cortical volume loss. Mild subcortical white matter change likely reflects small vessel ischemic microangiopathy. The brainstem and fourth ventricle are within normal limits. The basal ganglia are unremarkable in appearance. The cerebral hemispheres demonstrate grossly normal gray-white differentiation. No mass effect or midline shift is seen. Vascular: No hyperdense vessel or unexpected calcification. Skull: There is no evidence of fracture; visualized osseous structures are unremarkable in appearance. Sinuses/Orbits: The orbits are within normal limits. The paranasal sinuses and mastoid air cells are well-aerated. Other: No significant soft tissue abnormalities are seen. IMPRESSION: 1. No evidence of traumatic intracranial injury or fracture. 2. Moderate cortical volume loss and mild small vessel ischemic microangiopathy. Electronically Signed   By: Garald Balding M.D.   On: 02/28/2017 23:06    Review of Systems  Unable to perform ROS: Medical condition    Blood pressure (!) 162/68, pulse 66, temperature 97.5 F (36.4 C), temperature source Oral, resp. rate 14, height 5' (1.524 m), weight 60.2 kg (132 lb 12.8 oz), SpO2 96 %. Physical Exam  Vitals reviewed. Constitutional: She appears well-developed and well-nourished. No distress.  HENT:  Head:  Normocephalic and atraumatic.  Mouth/Throat: Oropharynx is clear and moist.  Eyes: Conjunctivae and EOM are normal. Pupils are equal, round, and reactive to light. No scleral icterus.  Neck: Normal range of motion. Neck supple. No JVD present. No tracheal deviation present. No thyromegaly present.  Cardiovascular: Normal rate, regular rhythm and normal heart sounds.  Exam reveals no gallop and no friction rub.   No murmur heard. Respiratory: Effort normal  and breath sounds normal.  GI: Soft. Bowel sounds are normal. She exhibits no distension. There is no tenderness.  Genitourinary:  Genitourinary Comments: Deferred  Lymphadenopathy:    She has no cervical adenopathy.  Neurological: She exhibits normal muscle tone.  Cannot assess as the patient will not stay awake for my exam  Skin: Skin is warm and dry. No rash noted. No erythema.  Psychiatric:  Cannot assess mental status as the patient would not cooperate with exam     Assessment/Plan This is a 66 year old female admitted for slurred speech. 1. Slurred speech: Evaluate for TIA although this may be the result of medication overuse. Carotid ultrasound, echocardiogram and MRI/MRA of the brain. If the patient improves clinically neurology consult may not be necessary. 2. Essential hypertension: Controlled; continue amlodipine, enalapril and metoprolol 3. Polypharmacy: The patient may be under the influence of muscle relaxers. Her cyclic antidepressant also in her urine. Monitor telemetry. 4. Coronary artery disease: Stable; continue aspirin and Imdur 5. Tobacco abuse: NicoDerm patch 6. DVT prophylaxis: Lovenox 7. GI prophylaxis: None  Harrie Foreman, MD 03/01/2017, 7:35 AM

## 2017-03-01 NOTE — Progress Notes (Signed)
*  PRELIMINARY RESULTS* Echocardiogram 2D Echocardiogram has been performed.  Cristela BlueHege, Mykia Holton 03/01/2017, 2:38 PM

## 2017-03-01 NOTE — Progress Notes (Signed)
PT Cancellation Note  Patient Details Name: Erica Stevens MRN: [id number] DOB: 12/17/1950   Cancelled Treatment:    Reason Eval/Treat Not Completed: Patient at procedure or test/unavailable (currently off floor for MRI).  Will attempt to see pt again later today, schedule permitting.   Encarnacion ChuAshley Abashian PT, DPT 03/01/2017, 11:57 AM

## 2017-03-01 NOTE — Evaluation (Signed)
Occupational Therapy Evaluation Patient Details Name: Erica Stevens MRN: [id number] DOB: August 22, 1951 Today's Date: 03/01/2017    History of Present Illness 66yo female pt admitted for slurred speech. PMHx significant for CKD, CHF, chronic pain syndrome, depression, HTN, polysubstance abuse, CAD w/ CABG, and recent T12 compression fx. Work up for stroke negative based on CT/MRI.   Clinical Impression   OT evaluation completed this date. Pt was generally independent with self care tasks at baseline, mother drives her to/from places, cousin helps with household mgt tasks, and pt endorses 2 falls in 12 months, "sometimes forgets" to take medications at home. Pt educated in med mgt strategies and energy conservation/falls prevention strategies to support safety and functional independence in the home. Pt presents with significant back pain due to recent fall, difficult to formally assess BUE strength/ROM due to pain, min guard level for transfers and ambulation  With no LOB noted, possibly self limiting due to pain to some extent. Pt could benefit from skilled OT while in the hospital to address noted impairments and functional deficits to maximize safety and return to PLOF. Do not anticipate any further OT needs following discharge.     Follow Up Recommendations  No OT follow up    Equipment Recommendations  None recommended by OT    Recommendations for Other Services       Precautions / Restrictions Precautions Precautions: Fall Restrictions Weight Bearing Restrictions: No      Mobility Bed Mobility Overal bed mobility: Modified Independent             General bed mobility comments: increased time/effort and pain with movement, but no physical assist required, no LOB  Transfers Overall transfer level: Needs assistance Equipment used: 1 person hand held assist Transfers: Sit to/from Stand Sit to Stand: Min guard         General transfer comment: hesitant due to pain,  VC for hand placement to maximize safety, no LOB noted    Balance Overall balance assessment: History of Falls;Needs assistance Sitting-balance support: No upper extremity supported;Feet supported Sitting balance-Leahy Scale: Good     Standing balance support: Single extremity supported Standing balance-Leahy Scale: Fair Standing balance comment: no overt LOB, handheld assist during functional transfers and static standing, decreased confidence and pain are limiting factors                           ADL either performed or assessed with clinical judgement   ADL Overall ADL's : Modified independent                                       General ADL Comments: pt able to perform all aspects of toileting, LB dressing seated EOB generally at baseline functional independence, self limiting somewhat due to pain, no LOB noted     Vision Baseline Vision/History: No visual deficits Patient Visual Report: No change from baseline Vision Assessment?: No apparent visual deficits     Perception     Praxis      Pertinent Vitals/Pain Pain Assessment: 0-10 Pain Score: 8  Pain Location: back, worse with movement Pain Descriptors / Indicators: Grimacing;Aching Pain Intervention(s): Limited activity within patient's tolerance;Monitored during session;Premedicated before session;Repositioned     Hand Dominance     Extremity/Trunk Assessment Upper Extremity Assessment Upper Extremity Assessment: Generalized weakness (difficult to assess due to back pain, good grip strength  bilaterally)   Lower Extremity Assessment Lower Extremity Assessment: Defer to PT evaluation;Generalized weakness   Cervical / Trunk Assessment Cervical / Trunk Assessment: Kyphotic   Communication Communication Communication: No difficulties   Cognition Arousal/Alertness: Awake/alert Behavior During Therapy: WFL for tasks assessed/performed Overall Cognitive Status: Within Functional  Limits for tasks assessed                                     General Comments       Exercises Other Exercises Other Exercises: pt educated in medication management to support adherence and minimize noncompliance, pt verbalized understanding and plan to obtain a pill organizer to help manage meds at home Other Exercises: pt educated in energy conservation and falls prevention strategies including proper footwear, body and environmental positioning to minimize LOB/falls; pt verbalized understanding   Shoulder Instructions      Home Living Family/patient expects to be discharged to:: Private residence Living Arrangements: Other relatives (cousin) Available Help at Discharge: Family;Available PRN/intermittently Type of Home: House Home Access: Stairs to enter [employer] of Steps: 2 flights of stairs to enter home, L rail with first flight, no rails with 2nd flight (previous encounter note indicates R rail with 2nd flight)   Home Layout: One level     Bathroom Shower/Tub: Chief Strategy Officer: Handicapped height (BSC over toilet)     Home Equipment: Cane - single point;Wheelchair - [employer] - 2 wheels          Prior Functioning/Environment Level of Independence: Needs assistance  Gait / Transfers Assistance Needed: indep with functional mobility using SPC in and outside of home ADL's / Homemaking Assistance Needed: cousin assisted with household tasks as needed, pt indep with ADL, mother drives pt, endorses "sometimes forget" to take meds   Comments: endorses 2 falls in past 12 months (including fall that led to this admission)        OT Problem List: Decreased strength;Pain;Decreased knowledge of use of DME or AE;Impaired balance (sitting and/or standing);Decreased activity tolerance;Decreased safety awareness      OT Treatment/Interventions: Self-care/ADL training;Therapeutic activities;Therapeutic exercise;Energy  conservation;DME and/or AE instruction;Patient/family education    OT Goals(Current goals can be found in the care plan section) Acute Rehab OT Goals Patient Stated Goal: have less pain OT Goal Formulation: With patient Time For Goal Achievement: 03/08/17 Potential to Achieve Goals: Good  OT Frequency: Min 1X/week   Barriers to D/C: Inaccessible home environment;Decreased caregiver support          Co-evaluation              AM-PAC PT "6 Clicks" Daily Activity     Outcome Measure Help from another person eating meals?: None Help from another person taking care of personal grooming?: None Help from another person toileting, which includes using toliet, bedpan, or urinal?: A Little Help from another person bathing (including washing, rinsing, drying)?: A Little Help from another person to put on and taking off regular upper body clothing?: None Help from another person to put on and taking off regular lower body clothing?: None 6 Click Score: 22   End of Session Equipment Utilized During Treatment: Gait belt  Activity Tolerance: Patient limited by pain Patient left: in bed;with call bell/phone within reach;with bed alarm set;Other (comment) (staff in to perform ECHO)  OT Visit Diagnosis: Other abnormalities of gait and mobility (R26.89);Repeated falls (R29.6);Pain Pain - part of body:  (back)  Time: [id number] OT Time Calculation (min): 23 min Charges:  OT General Charges $OT Visit: 1 Procedure OT Evaluation $OT Eval Low Complexity: 1 Procedure OT Treatments $Self Care/Home Management : 8-22 mins G-Codes: OT G-codes **NOT FOR INPATIENT CLASS** Functional Assessment Tool Used: AM-PAC 6 Clicks Daily Activity;Clinical judgement Functional Limitation: Self care Self Care Current Status (L2440(G8987): At least 20 percent but less than 40 percent impaired, limited or restricted Self Care Goal Status (N0272(G8988): At least 20 percent but less than 40 percent impaired,  limited or restricted   Richrd PrimeJamie Stiller, MPH, MS, OTR/L ascom [phone] 03/01/17, 3:05 PM

## 2017-03-01 NOTE — Care Management (Signed)
Physical therapy evaluation completed. Recommending home with Home Health and Physical Therapy. Would like [facility] again. Will update Feliberto GottronJason Hinton, advanced representative. Discharge to home today per Dr. Nonie HoyerSaniani. Gwenette GreetBrenda S Sherilyn Windhorst RN MSN CCM Care Management [phone]

## 2017-03-01 NOTE — Progress Notes (Signed)
SLP Cancellation Note  Patient Details Name: Erica Stevens MRN: [id number] DOB: 09/03/1951   Cancelled treatment:       Reason Eval/Treat Not Completed: SLP screened, no needs identified, will sign off (chart reviewed; NSG consulted. Met w/ pt during meal.) Noted results of the MRI which were negative. Per MD note, patient was taking increasing doses of Flexeril due to a recent fall, and possible compression fracture, which was contributing to her slurred speech likely. Pt is A/O x3; verbally conversive and able to answer all general questions. Described her lunch meal, stay in the hospital, and foods she usually ate at home. She denied any specific problem w/ her speech during this discussion. Of note, she answered questions appropriately w/ OT evaluation that followed. No skilled ST Services indicated at this time. MD to reconsult if any change in status.     Orinda Kenner, MS, CCC-SLP Watson,Katherine 03/01/2017, 1:48 PM

## 2017-03-02 LAB — HEMOGLOBIN A1C
Hgb A1c MFr Bld: 5 % (ref 4.8–5.6)
Mean Plasma Glucose: 97 mg/dL

## 2017-03-04 NOTE — Discharge Summary (Signed)
[facility] [place] [facility]   PATIENT NAME: Erica Stevens    MR#:  [id number]  DATE OF BIRTH:  Dec 15, 1950  DATE OF ADMISSION:  02/28/2017 ADMITTING PHYSICIAN: Arnaldo NatalMichael S Diamond, MD  DATE OF DISCHARGE: 03/01/2017  4:00 PM  PRIMARY CARE PHYSICIAN: [facility]    ADMISSION DIAGNOSIS:  Slurred speech [R47.81] Altered mental status, unspecified altered mental status type [R41.82] Low back pain without sciatica, unspecified back pain laterality, unspecified chronicity [M54.5]  DISCHARGE DIAGNOSIS:  Active Problems:   Slurred speech   SECONDARY DIAGNOSIS:   Past Medical History:  Diagnosis Date  . Allergic rhinitis due to pollen   . Anxiety   . Brachial neuritis or radiculitis NOS   . Cellulitis and abscess of unspecified site   . Chronic kidney disease, stage I   . Chronic pain syndrome   . Congestive heart failure, unspecified   . Coronary atherosclerosis of unspecified type of vessel, native or graft   . Degeneration of cervical intervertebral disc   . Depression   . Hypertension   . Migraine, unspecified, without mention of intractable migraine without mention of status migrainosus   . 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 Osteoporosis, unspecified   . Pure hypercholesterolemia   . Reflux esophagitis   . Thoracic or lumbosacral neuritis or radiculitis, unspecified   . Unspecified urinary incontinence     HOSPITAL COURSE:   66 year old female with past medical history of anxiety, chronic kidney disease stage I, chronic pain syndrome, CHF, history of coronary disease, hypertension, depression, migraines, osteoporosis, GERD who presented to the hospital due to slurred speech.  1. TIA/CVA-this was working diagnoses admission given the patient's slurred speech. Her slurred speech with all the results shortly after admission. -Patient underwent extensive workup including CT scan of the head, MRI/MRA of the brain, carotid duplex,  echocardiogram which was essentially normal. -She is clinically asymptomatic and therefore doing well and being discharged home.  2. Essential hypertension-patient will continue her Norvasc, enalapril, metoprolol.  3. Polypharmacy-patient's slurred speech was likely related to her increasing intake of Phenergan. This was accidental and not on purpose. Patient denied any suicidal or homicidal ideations. Her mental status is improved and she is back to baseline and therefore being discharged home.  4. Coronary artery disease-patient denies any chest pain. Patient will continue aspirin, beta blocker, Imdur.  DISCHARGE CONDITIONS:   Stable  CONSULTS OBTAINED:    DRUG ALLERGIES:   Allergies  Allergen Reactions  . Erythromycin   . Ivp Dye [Iodinated Diagnostic Agents]   . Sulfa Antibiotics     Stated turns orange  . Sulfur     DISCHARGE MEDICATIONS:   Allergies as of 03/01/2017      Reactions   Erythromycin    Ivp Dye [iodinated Diagnostic Agents]    Sulfa Antibiotics    Stated turns orange   Sulfur       Medication List    STOP taking these medications   atorvastatin 40 MG tablet Commonly known as:  LIPITOR   montelukast 10 MG tablet Commonly known as:  SINGULAIR   nicotine 21 mg/24hr patch Commonly known as:  NICODERM CQ - dosed in mg/24 hours   omega-3 acid ethyl esters 1 g capsule Commonly known as:  LOVAZA     TAKE these medications   albuterol 108 (90 Base) MCG/ACT inhaler Commonly known as:  PROVENTIL HFA;VENTOLIN HFA Inhale 2 puffs into the lungs every 6 (six) hours as needed for wheezing or shortness  of breath.   amLODipine 5 MG tablet Commonly known as:  NORVASC Take 1 tablet (5 mg total) by mouth daily.   aspirin 81 MG tablet Take 81 mg by mouth daily.   cyclobenzaprine 10 MG tablet Commonly known as:  FLEXERIL Take 1 tablet (10 mg total) by mouth 3 (three) times daily as needed for muscle spasms.   enalapril 5 MG tablet Commonly known as:   VASOTEC Take 1 tablet (5 mg total) by mouth 2 (two) times daily.   ibuprofen 600 MG tablet Commonly known as:  ADVIL,MOTRIN Take 1 tablet (600 mg total) by mouth every 8 (eight) hours as needed.   isosorbide mononitrate 30 MG 24 hr tablet Commonly known as:  IMDUR TAKE ONE (1) TABLET EACH DAY   metoprolol succinate 25 MG 24 hr tablet Commonly known as:  TOPROL-XL TAKE ONE (1) TABLET EACH DAY   mirtazapine 15 MG tablet Commonly known as:  REMERON Take 1 tablet (15 mg total) by mouth at bedtime.   Naloxone HCl 0.4 MG/0.4ML Soaj Follow package instructions for heroin or opioid overdose.   nitroGLYCERIN 0.4 MG/SPRAY spray Commonly known as:  NITROLINGUAL AS DIRECTED         DISCHARGE INSTRUCTIONS:   DIET:  Cardiac diet  DISCHARGE CONDITION:  Stable  ACTIVITY:  Activity as tolerated  OXYGEN:  Home Oxygen: No.   Oxygen Delivery: room air  DISCHARGE LOCATION:  home   If you experience worsening of your admission symptoms, develop shortness of breath, life threatening emergency, suicidal or homicidal thoughts you must seek medical attention immediately by calling 911 or calling your MD immediately  if symptoms less severe.  You Must read complete instructions/literature along with all the possible adverse reactions/side effects for all the Medicines you take and that have been prescribed to you. Take any new Medicines after you have completely understood and accpet all the possible adverse reactions/side effects.   Please note  You were cared for by a hospitalist during your hospital stay. If you have any questions about your discharge medications or the care you received while you were in the hospital after you are discharged, you can call the unit and asked to speak with the hospitalist on call if the hospitalist that took care of you is not available. Once you are discharged, your primary care physician will handle any further medical issues. Please note that NO  REFILLS for any discharge medications will be authorized once you are discharged, as it is imperative that you return to your primary care physician (or establish a relationship with a primary care physician if you do not have one) for your aftercare needs so that they can reassess your need for medications and monitor your lab values.     Today   Mental status back to baseline.  Denies any HI or SI.  Neurologic work up Negative and no further slurred speech.   VITAL SIGNS:  Blood pressure (!) 178/69, pulse 65, temperature 98 F (36.7 C), temperature source Oral, resp. rate 16, height 5' (1.524 m), weight 60.2 kg (132 lb 12.8 oz), SpO2 97 %.  I/O:  No intake or output data in the 24 hours ending 03/04/17 1501  PHYSICAL EXAMINATION:  GENERAL:  66 y.o.-year-old patient lying in the bed with no acute distress.  EYES: Pupils equal, round, reactive to light and accommodation. No scleral icterus. Extraocular muscles intact.  HEENT: Head atraumatic, normocephalic. Oropharynx and nasopharynx clear.  NECK:  Supple, no jugular venous distention. No thyroid  enlargement, no tenderness.  LUNGS: Normal breath sounds bilaterally, no wheezing, rales,rhonchi. No use of accessory muscles of respiration.  CARDIOVASCULAR: S1, S2 normal. No murmurs, rubs, or gallops.  ABDOMEN: Soft, non-tender, non-distended. Bowel sounds present. No organomegaly or mass.  EXTREMITIES: No pedal edema, cyanosis, or clubbing.  NEUROLOGIC: Cranial nerves II through XII are intact. No focal motor or sensory defecits b/l.  PSYCHIATRIC: The patient is alert and oriented x 3.  SKIN: No obvious rash, lesion, or ulcer.   DATA REVIEW:   CBC  Recent Labs Lab 02/28/17 2311  WBC 8.0  HGB 14.2  HCT 41.3  PLT 233    Chemistries   Recent Labs Lab 02/28/17 2311  NA 140  K 3.8  CL 103  CO2 29  GLUCOSE 97  BUN 13  CREATININE 0.87  CALCIUM 9.2    Cardiac Enzymes  Recent Labs Lab 02/28/17 2311  TROPONINI <0.03     Microbiology Results  Results for orders placed or performed during the hospital encounter of 01/12/17  Culture, blood (routine x 2)     Status: None   Collection Time: 01/13/17 12:37 AM  Result Value Ref Range Status   Specimen Description BLOOD L HAND  Final   Special Requests Blood Culture adequate volume  Final   Culture NO GROWTH 5 DAYS  Final   Report Status 01/18/2017 FINAL  Final  Culture, blood (routine x 2)     Status: None   Collection Time: 01/13/17 12:37 AM  Result Value Ref Range Status   Specimen Description BLOOD L AC  Final   Special Requests Blood Culture adequate volume  Final   Culture NO GROWTH 5 DAYS  Final   Report Status 01/18/2017 FINAL  Final  MRSA PCR Screening     Status: None   Collection Time: 01/13/17  2:50 AM  Result Value Ref Range Status   MRSA by PCR NEGATIVE NEGATIVE Final    Comment:        The GeneXpert MRSA Assay (FDA approved for NASAL specimens only), is one component of a comprehensive MRSA colonization surveillance program. It is not intended to diagnose MRSA infection nor to guide or monitor treatment for MRSA infections.     RADIOLOGY:  No results found.    Management plans discussed with the patient, family and they are in agreement.  CODE STATUS:  Code Status History    Date Active Date Inactive Code Status Order ID Comments User Context   03/01/2017  4:15 AM 03/01/2017  8:38 PM Full Code [id number]  Arnaldo Natal, MD Inpatient   01/13/2017  2:38 AM 01/14/2017  6:28 PM Full Code [id number]  Arnaldo Natal, MD ED   04/28/2016  6:12 PM 04/29/2016  2:51 PM Full Code [id number]  Eugenie Norrie, NP ED      TOTAL TIME TAKING CARE OF THIS PATIENT: 40 minutes.    Houston Siren M.D on 03/04/2017 at 3:01 PM  Between 7am to 6pm - Pager - [phone]  After 6pm go to [email or web address] - Therapist, nutritional Hospitalists  Office  [phone]  CC: Primary care physician; [facility], [place]

## 2017-03-10 NOTE — Progress Notes (Signed)
[facility]  Patient Status:  Multiple attempts made to contact patient (LVMs). Spoke a few times with EC who told me she spoke with patient and requested for her to call back. Spoke with EC again and she said she would relay another message to patient to call us. Patient has not called back. Patient will not be taken under care at this time. MD has been notified, Terrilee CroakBrenda Holland, CM & Collie SiadAngela Johnson, CM also notified as well.    Dimple CaseyJason E Hinton 03/10/2017, 9:16 AM

## 2017-04-27 DIAGNOSIS — I6602 Occlusion and stenosis of left middle cerebral artery: Secondary | ICD-10-CM | POA: Diagnosis not present

## 2017-04-27 DIAGNOSIS — S79922A Unspecified injury of left thigh, initial encounter: Secondary | ICD-10-CM | POA: Diagnosis not present

## 2017-04-27 DIAGNOSIS — R400 Somnolence: Secondary | ICD-10-CM | POA: Diagnosis not present

## 2017-04-27 DIAGNOSIS — M6281 Muscle weakness (generalized): Secondary | ICD-10-CM | POA: Diagnosis not present

## 2017-04-27 DIAGNOSIS — R531 Weakness: Secondary | ICD-10-CM | POA: Diagnosis not present

## 2017-04-27 DIAGNOSIS — I2510 Atherosclerotic heart disease of native coronary artery without angina pectoris: Secondary | ICD-10-CM | POA: Diagnosis not present

## 2017-04-27 DIAGNOSIS — R5381 Other malaise: Secondary | ICD-10-CM | POA: Diagnosis not present

## 2017-04-27 DIAGNOSIS — R638 Other symptoms and signs concerning food and fluid intake: Secondary | ICD-10-CM | POA: Diagnosis not present

## 2017-04-27 DIAGNOSIS — R51 Headache: Secondary | ICD-10-CM | POA: Diagnosis not present

## 2017-04-27 DIAGNOSIS — R079 Chest pain, unspecified: Secondary | ICD-10-CM | POA: Diagnosis not present

## 2017-04-27 DIAGNOSIS — M25552 Pain in left hip: Secondary | ICD-10-CM | POA: Diagnosis not present

## 2017-04-27 DIAGNOSIS — S3993XA Unspecified injury of pelvis, initial encounter: Secondary | ICD-10-CM | POA: Diagnosis not present

## 2017-04-27 DIAGNOSIS — R4781 Slurred speech: Secondary | ICD-10-CM | POA: Diagnosis not present

## 2017-04-27 DIAGNOSIS — R600 Localized edema: Secondary | ICD-10-CM | POA: Diagnosis not present

## 2017-04-27 DIAGNOSIS — F17200 Nicotine dependence, unspecified, uncomplicated: Secondary | ICD-10-CM | POA: Diagnosis not present

## 2017-04-27 DIAGNOSIS — M545 Low back pain: Secondary | ICD-10-CM | POA: Diagnosis not present

## 2017-04-27 DIAGNOSIS — R102 Pelvic and perineal pain: Secondary | ICD-10-CM | POA: Diagnosis not present

## 2017-04-27 DIAGNOSIS — I119 Hypertensive heart disease without heart failure: Secondary | ICD-10-CM | POA: Diagnosis not present

## 2017-04-27 DIAGNOSIS — M546 Pain in thoracic spine: Secondary | ICD-10-CM | POA: Diagnosis not present

## 2017-04-28 DIAGNOSIS — M40204 Unspecified kyphosis, thoracic region: Secondary | ICD-10-CM | POA: Diagnosis not present

## 2017-04-28 DIAGNOSIS — M8580 Other specified disorders of bone density and structure, unspecified site: Secondary | ICD-10-CM | POA: Diagnosis not present

## 2017-04-28 DIAGNOSIS — I2510 Atherosclerotic heart disease of native coronary artery without angina pectoris: Secondary | ICD-10-CM | POA: Diagnosis not present

## 2017-04-28 DIAGNOSIS — I10 Essential (primary) hypertension: Secondary | ICD-10-CM | POA: Diagnosis not present

## 2017-04-28 DIAGNOSIS — S22080A Wedge compression fracture of T11-T12 vertebra, initial encounter for closed fracture: Secondary | ICD-10-CM | POA: Diagnosis not present

## 2017-04-28 DIAGNOSIS — R531 Weakness: Secondary | ICD-10-CM | POA: Diagnosis not present

## 2017-04-28 DIAGNOSIS — R4781 Slurred speech: Secondary | ICD-10-CM | POA: Diagnosis not present

## 2017-04-29 DIAGNOSIS — I119 Hypertensive heart disease without heart failure: Secondary | ICD-10-CM | POA: Diagnosis not present

## 2017-04-29 DIAGNOSIS — I25810 Atherosclerosis of coronary artery bypass graft(s) without angina pectoris: Secondary | ICD-10-CM | POA: Diagnosis not present

## 2017-04-29 DIAGNOSIS — R531 Weakness: Secondary | ICD-10-CM | POA: Diagnosis not present

## 2017-05-01 DIAGNOSIS — I110 Hypertensive heart disease with heart failure: Secondary | ICD-10-CM | POA: Diagnosis not present

## 2017-05-01 DIAGNOSIS — M40204 Unspecified kyphosis, thoracic region: Secondary | ICD-10-CM | POA: Diagnosis not present

## 2017-05-01 DIAGNOSIS — F329 Major depressive disorder, single episode, unspecified: Secondary | ICD-10-CM | POA: Diagnosis not present

## 2017-05-01 DIAGNOSIS — G8929 Other chronic pain: Secondary | ICD-10-CM | POA: Diagnosis not present

## 2017-05-01 DIAGNOSIS — I509 Heart failure, unspecified: Secondary | ICD-10-CM | POA: Diagnosis not present

## 2017-05-01 DIAGNOSIS — S22080D Wedge compression fracture of T11-T12 vertebra, subsequent encounter for fracture with routine healing: Secondary | ICD-10-CM | POA: Diagnosis not present

## 2017-05-01 DIAGNOSIS — I2510 Atherosclerotic heart disease of native coronary artery without angina pectoris: Secondary | ICD-10-CM | POA: Diagnosis not present

## 2017-05-01 DIAGNOSIS — M810 Age-related osteoporosis without current pathological fracture: Secondary | ICD-10-CM | POA: Diagnosis not present

## 2017-05-01 DIAGNOSIS — E785 Hyperlipidemia, unspecified: Secondary | ICD-10-CM | POA: Diagnosis not present

## 2017-05-02 DIAGNOSIS — F329 Major depressive disorder, single episode, unspecified: Secondary | ICD-10-CM | POA: Diagnosis not present

## 2017-05-02 DIAGNOSIS — M40204 Unspecified kyphosis, thoracic region: Secondary | ICD-10-CM | POA: Diagnosis not present

## 2017-05-02 DIAGNOSIS — G8929 Other chronic pain: Secondary | ICD-10-CM | POA: Diagnosis not present

## 2017-05-02 DIAGNOSIS — I110 Hypertensive heart disease with heart failure: Secondary | ICD-10-CM | POA: Diagnosis not present

## 2017-05-02 DIAGNOSIS — I509 Heart failure, unspecified: Secondary | ICD-10-CM | POA: Diagnosis not present

## 2017-05-02 DIAGNOSIS — M810 Age-related osteoporosis without current pathological fracture: Secondary | ICD-10-CM | POA: Diagnosis not present

## 2017-05-02 DIAGNOSIS — I2510 Atherosclerotic heart disease of native coronary artery without angina pectoris: Secondary | ICD-10-CM | POA: Diagnosis not present

## 2017-05-02 DIAGNOSIS — S22080D Wedge compression fracture of T11-T12 vertebra, subsequent encounter for fracture with routine healing: Secondary | ICD-10-CM | POA: Diagnosis not present

## 2017-05-02 DIAGNOSIS — E785 Hyperlipidemia, unspecified: Secondary | ICD-10-CM | POA: Diagnosis not present

## 2017-05-04 DIAGNOSIS — M40204 Unspecified kyphosis, thoracic region: Secondary | ICD-10-CM | POA: Diagnosis not present

## 2017-05-04 DIAGNOSIS — E785 Hyperlipidemia, unspecified: Secondary | ICD-10-CM | POA: Diagnosis not present

## 2017-05-04 DIAGNOSIS — G8929 Other chronic pain: Secondary | ICD-10-CM | POA: Diagnosis not present

## 2017-05-04 DIAGNOSIS — S22080D Wedge compression fracture of T11-T12 vertebra, subsequent encounter for fracture with routine healing: Secondary | ICD-10-CM | POA: Diagnosis not present

## 2017-05-04 DIAGNOSIS — F329 Major depressive disorder, single episode, unspecified: Secondary | ICD-10-CM | POA: Diagnosis not present

## 2017-05-04 DIAGNOSIS — I110 Hypertensive heart disease with heart failure: Secondary | ICD-10-CM | POA: Diagnosis not present

## 2017-05-04 DIAGNOSIS — M810 Age-related osteoporosis without current pathological fracture: Secondary | ICD-10-CM | POA: Diagnosis not present

## 2017-05-04 DIAGNOSIS — I509 Heart failure, unspecified: Secondary | ICD-10-CM | POA: Diagnosis not present

## 2017-05-04 DIAGNOSIS — I2510 Atherosclerotic heart disease of native coronary artery without angina pectoris: Secondary | ICD-10-CM | POA: Diagnosis not present

## 2017-05-05 DIAGNOSIS — M810 Age-related osteoporosis without current pathological fracture: Secondary | ICD-10-CM | POA: Diagnosis not present

## 2017-05-05 DIAGNOSIS — G8929 Other chronic pain: Secondary | ICD-10-CM | POA: Diagnosis not present

## 2017-05-05 DIAGNOSIS — F329 Major depressive disorder, single episode, unspecified: Secondary | ICD-10-CM | POA: Diagnosis not present

## 2017-05-05 DIAGNOSIS — I110 Hypertensive heart disease with heart failure: Secondary | ICD-10-CM | POA: Diagnosis not present

## 2017-05-05 DIAGNOSIS — I509 Heart failure, unspecified: Secondary | ICD-10-CM | POA: Diagnosis not present

## 2017-05-05 DIAGNOSIS — M40204 Unspecified kyphosis, thoracic region: Secondary | ICD-10-CM | POA: Diagnosis not present

## 2017-05-05 DIAGNOSIS — I2510 Atherosclerotic heart disease of native coronary artery without angina pectoris: Secondary | ICD-10-CM | POA: Diagnosis not present

## 2017-05-05 DIAGNOSIS — E785 Hyperlipidemia, unspecified: Secondary | ICD-10-CM | POA: Diagnosis not present

## 2017-05-05 DIAGNOSIS — S22080D Wedge compression fracture of T11-T12 vertebra, subsequent encounter for fracture with routine healing: Secondary | ICD-10-CM | POA: Diagnosis not present

## 2017-05-08 DIAGNOSIS — F329 Major depressive disorder, single episode, unspecified: Secondary | ICD-10-CM | POA: Diagnosis not present

## 2017-05-08 DIAGNOSIS — I2510 Atherosclerotic heart disease of native coronary artery without angina pectoris: Secondary | ICD-10-CM | POA: Diagnosis not present

## 2017-05-08 DIAGNOSIS — G8929 Other chronic pain: Secondary | ICD-10-CM | POA: Diagnosis not present

## 2017-05-08 DIAGNOSIS — I509 Heart failure, unspecified: Secondary | ICD-10-CM | POA: Diagnosis not present

## 2017-05-08 DIAGNOSIS — E785 Hyperlipidemia, unspecified: Secondary | ICD-10-CM | POA: Diagnosis not present

## 2017-05-08 DIAGNOSIS — M40204 Unspecified kyphosis, thoracic region: Secondary | ICD-10-CM | POA: Diagnosis not present

## 2017-05-08 DIAGNOSIS — I110 Hypertensive heart disease with heart failure: Secondary | ICD-10-CM | POA: Diagnosis not present

## 2017-05-08 DIAGNOSIS — S22080D Wedge compression fracture of T11-T12 vertebra, subsequent encounter for fracture with routine healing: Secondary | ICD-10-CM | POA: Diagnosis not present

## 2017-05-08 DIAGNOSIS — M810 Age-related osteoporosis without current pathological fracture: Secondary | ICD-10-CM | POA: Diagnosis not present

## 2017-05-10 DIAGNOSIS — M810 Age-related osteoporosis without current pathological fracture: Secondary | ICD-10-CM | POA: Diagnosis not present

## 2017-05-10 DIAGNOSIS — F329 Major depressive disorder, single episode, unspecified: Secondary | ICD-10-CM | POA: Diagnosis not present

## 2017-05-10 DIAGNOSIS — I509 Heart failure, unspecified: Secondary | ICD-10-CM | POA: Diagnosis not present

## 2017-05-10 DIAGNOSIS — G8929 Other chronic pain: Secondary | ICD-10-CM | POA: Diagnosis not present

## 2017-05-10 DIAGNOSIS — S22080D Wedge compression fracture of T11-T12 vertebra, subsequent encounter for fracture with routine healing: Secondary | ICD-10-CM | POA: Diagnosis not present

## 2017-05-10 DIAGNOSIS — I110 Hypertensive heart disease with heart failure: Secondary | ICD-10-CM | POA: Diagnosis not present

## 2017-05-10 DIAGNOSIS — I2510 Atherosclerotic heart disease of native coronary artery without angina pectoris: Secondary | ICD-10-CM | POA: Diagnosis not present

## 2017-05-10 DIAGNOSIS — E785 Hyperlipidemia, unspecified: Secondary | ICD-10-CM | POA: Diagnosis not present

## 2017-05-10 DIAGNOSIS — M40204 Unspecified kyphosis, thoracic region: Secondary | ICD-10-CM | POA: Diagnosis not present

## 2017-05-11 DIAGNOSIS — F329 Major depressive disorder, single episode, unspecified: Secondary | ICD-10-CM | POA: Diagnosis not present

## 2017-05-11 DIAGNOSIS — I2510 Atherosclerotic heart disease of native coronary artery without angina pectoris: Secondary | ICD-10-CM | POA: Diagnosis not present

## 2017-05-11 DIAGNOSIS — I110 Hypertensive heart disease with heart failure: Secondary | ICD-10-CM | POA: Diagnosis not present

## 2017-05-11 DIAGNOSIS — M810 Age-related osteoporosis without current pathological fracture: Secondary | ICD-10-CM | POA: Diagnosis not present

## 2017-05-11 DIAGNOSIS — I509 Heart failure, unspecified: Secondary | ICD-10-CM | POA: Diagnosis not present

## 2017-05-11 DIAGNOSIS — M40204 Unspecified kyphosis, thoracic region: Secondary | ICD-10-CM | POA: Diagnosis not present

## 2017-05-11 DIAGNOSIS — S22080D Wedge compression fracture of T11-T12 vertebra, subsequent encounter for fracture with routine healing: Secondary | ICD-10-CM | POA: Diagnosis not present

## 2017-05-11 DIAGNOSIS — E785 Hyperlipidemia, unspecified: Secondary | ICD-10-CM | POA: Diagnosis not present

## 2017-05-11 DIAGNOSIS — G8929 Other chronic pain: Secondary | ICD-10-CM | POA: Diagnosis not present

## 2017-05-15 DIAGNOSIS — S22080D Wedge compression fracture of T11-T12 vertebra, subsequent encounter for fracture with routine healing: Secondary | ICD-10-CM | POA: Diagnosis not present

## 2017-05-15 DIAGNOSIS — M40204 Unspecified kyphosis, thoracic region: Secondary | ICD-10-CM | POA: Diagnosis not present

## 2017-05-15 DIAGNOSIS — G8929 Other chronic pain: Secondary | ICD-10-CM | POA: Diagnosis not present

## 2017-05-15 DIAGNOSIS — I110 Hypertensive heart disease with heart failure: Secondary | ICD-10-CM | POA: Diagnosis not present

## 2017-05-15 DIAGNOSIS — F329 Major depressive disorder, single episode, unspecified: Secondary | ICD-10-CM | POA: Diagnosis not present

## 2017-05-15 DIAGNOSIS — I2510 Atherosclerotic heart disease of native coronary artery without angina pectoris: Secondary | ICD-10-CM | POA: Diagnosis not present

## 2017-05-15 DIAGNOSIS — I509 Heart failure, unspecified: Secondary | ICD-10-CM | POA: Diagnosis not present

## 2017-05-15 DIAGNOSIS — E785 Hyperlipidemia, unspecified: Secondary | ICD-10-CM | POA: Diagnosis not present

## 2017-05-15 DIAGNOSIS — M810 Age-related osteoporosis without current pathological fracture: Secondary | ICD-10-CM | POA: Diagnosis not present

## 2017-05-16 ENCOUNTER — Telehealth: Payer: Self-pay | Admitting: Family Medicine

## 2017-05-16 DIAGNOSIS — E785 Hyperlipidemia, unspecified: Secondary | ICD-10-CM | POA: Diagnosis not present

## 2017-05-16 DIAGNOSIS — F329 Major depressive disorder, single episode, unspecified: Secondary | ICD-10-CM | POA: Diagnosis not present

## 2017-05-16 DIAGNOSIS — I110 Hypertensive heart disease with heart failure: Secondary | ICD-10-CM | POA: Diagnosis not present

## 2017-05-16 DIAGNOSIS — S22080D Wedge compression fracture of T11-T12 vertebra, subsequent encounter for fracture with routine healing: Secondary | ICD-10-CM | POA: Diagnosis not present

## 2017-05-16 DIAGNOSIS — M40204 Unspecified kyphosis, thoracic region: Secondary | ICD-10-CM | POA: Diagnosis not present

## 2017-05-16 DIAGNOSIS — G8929 Other chronic pain: Secondary | ICD-10-CM | POA: Diagnosis not present

## 2017-05-16 DIAGNOSIS — I2510 Atherosclerotic heart disease of native coronary artery without angina pectoris: Secondary | ICD-10-CM | POA: Diagnosis not present

## 2017-05-16 DIAGNOSIS — I509 Heart failure, unspecified: Secondary | ICD-10-CM | POA: Diagnosis not present

## 2017-05-16 DIAGNOSIS — M810 Age-related osteoporosis without current pathological fracture: Secondary | ICD-10-CM | POA: Diagnosis not present

## 2017-05-16 NOTE — Telephone Encounter (Signed)
I have not seen her since 05/2016. I don't even know what she is currently taking. [facility] follow up in our office, this week or next , after EC evaluation.

## 2017-05-16 NOTE — Telephone Encounter (Signed)
Called and left a message for Denny Peonvery to call me back regarding Mrs. Viveiros.

## 2017-05-16 NOTE — Telephone Encounter (Signed)
Spoke with Erica Stevens and read off Dr. Carlynn Stevens message about how Erica Stevens has not been seen in a year. In patient chart Erica Stevens and Erica Stevens have been prescribing BP medication. But since her BP is so elevated Dr. Carlynn Stevens recommends patient to go to ER.

## 2017-05-16 NOTE — Telephone Encounter (Signed)
[facility] calling to give blood pressure readings: Today it was: 197/76 05/11/17 it was 196/72 05/10/17 it was 192/78 Pt take bp medications every morning around 8:30am All reading was done around 9am Erica Stevens can be reached at [phone]

## 2017-05-17 ENCOUNTER — Ambulatory Visit: Payer: Self-pay | Admitting: Family Medicine

## 2017-05-18 DIAGNOSIS — I110 Hypertensive heart disease with heart failure: Secondary | ICD-10-CM | POA: Diagnosis not present

## 2017-05-18 DIAGNOSIS — M810 Age-related osteoporosis without current pathological fracture: Secondary | ICD-10-CM | POA: Diagnosis not present

## 2017-05-18 DIAGNOSIS — M40204 Unspecified kyphosis, thoracic region: Secondary | ICD-10-CM | POA: Diagnosis not present

## 2017-05-18 DIAGNOSIS — G8929 Other chronic pain: Secondary | ICD-10-CM | POA: Diagnosis not present

## 2017-05-18 DIAGNOSIS — S22080D Wedge compression fracture of T11-T12 vertebra, subsequent encounter for fracture with routine healing: Secondary | ICD-10-CM | POA: Diagnosis not present

## 2017-05-18 DIAGNOSIS — I509 Heart failure, unspecified: Secondary | ICD-10-CM | POA: Diagnosis not present

## 2017-05-18 DIAGNOSIS — E785 Hyperlipidemia, unspecified: Secondary | ICD-10-CM | POA: Diagnosis not present

## 2017-05-18 DIAGNOSIS — F329 Major depressive disorder, single episode, unspecified: Secondary | ICD-10-CM | POA: Diagnosis not present

## 2017-05-18 DIAGNOSIS — I2510 Atherosclerotic heart disease of native coronary artery without angina pectoris: Secondary | ICD-10-CM | POA: Diagnosis not present

## 2017-05-22 ENCOUNTER — Ambulatory Visit (INDEPENDENT_AMBULATORY_CARE_PROVIDER_SITE_OTHER): Payer: Medicare HMO | Admitting: Family Medicine

## 2017-05-22 ENCOUNTER — Encounter: Payer: Self-pay | Admitting: Family Medicine

## 2017-05-22 VITALS — BP 148/78 | HR 68 | Temp 98.1°F | Resp 16 | Ht 60.0 in | Wt 131.4 lb

## 2017-05-22 DIAGNOSIS — I110 Hypertensive heart disease with heart failure: Secondary | ICD-10-CM | POA: Diagnosis not present

## 2017-05-22 DIAGNOSIS — E785 Hyperlipidemia, unspecified: Secondary | ICD-10-CM

## 2017-05-22 DIAGNOSIS — F3341 Major depressive disorder, recurrent, in partial remission: Secondary | ICD-10-CM

## 2017-05-22 DIAGNOSIS — M40204 Unspecified kyphosis, thoracic region: Secondary | ICD-10-CM | POA: Diagnosis not present

## 2017-05-22 DIAGNOSIS — M810 Age-related osteoporosis without current pathological fracture: Secondary | ICD-10-CM | POA: Diagnosis not present

## 2017-05-22 DIAGNOSIS — I700 Atherosclerosis of aorta: Secondary | ICD-10-CM

## 2017-05-22 DIAGNOSIS — S22080D Wedge compression fracture of T11-T12 vertebra, subsequent encounter for fracture with routine healing: Secondary | ICD-10-CM | POA: Diagnosis not present

## 2017-05-22 DIAGNOSIS — F329 Major depressive disorder, single episode, unspecified: Secondary | ICD-10-CM | POA: Diagnosis not present

## 2017-05-22 DIAGNOSIS — I10 Essential (primary) hypertension: Secondary | ICD-10-CM

## 2017-05-22 DIAGNOSIS — I5022 Chronic systolic (congestive) heart failure: Secondary | ICD-10-CM

## 2017-05-22 DIAGNOSIS — I509 Heart failure, unspecified: Secondary | ICD-10-CM | POA: Diagnosis not present

## 2017-05-22 DIAGNOSIS — F1921 Other psychoactive substance dependence, in remission: Secondary | ICD-10-CM | POA: Diagnosis not present

## 2017-05-22 DIAGNOSIS — M4854XA Collapsed vertebra, not elsewhere classified, thoracic region, initial encounter for fracture: Secondary | ICD-10-CM | POA: Insufficient documentation

## 2017-05-22 DIAGNOSIS — S22009A Unspecified fracture of unspecified thoracic vertebra, initial encounter for closed fracture: Secondary | ICD-10-CM | POA: Insufficient documentation

## 2017-05-22 DIAGNOSIS — G8929 Other chronic pain: Secondary | ICD-10-CM | POA: Diagnosis not present

## 2017-05-22 DIAGNOSIS — I2510 Atherosclerotic heart disease of native coronary artery without angina pectoris: Secondary | ICD-10-CM | POA: Diagnosis not present

## 2017-05-22 DIAGNOSIS — Z23 Encounter for immunization: Secondary | ICD-10-CM | POA: Diagnosis not present

## 2017-05-22 MED ORDER — METOPROLOL SUCCINATE ER 25 MG PO TB24: ORAL_TABLET | ORAL | 0 refills | Status: DC

## 2017-05-22 MED ORDER — ENALAPRIL MALEATE 10 MG PO TABS: 10.0000 mg | ORAL_TABLET | Freq: Two times a day (BID) | ORAL | 0 refills | Status: DC

## 2017-05-22 MED ORDER — ATORVASTATIN CALCIUM 80 MG PO TABS: 80.0000 mg | ORAL_TABLET | Freq: Every day | ORAL | 3 refills | Status: DC

## 2017-05-22 MED ORDER — AMLODIPINE BESYLATE 5 MG PO TABS: 5.0000 mg | ORAL_TABLET | Freq: Every day | ORAL | 0 refills | Status: DC

## 2017-05-22 NOTE — Patient Instructions (Signed)
Start taking all three bp medications at night, check bp in am and if above 150/90 take take Norvasc 5 mg and Vasotec 5 mg If above between 130/90-140/90 take Vasotec only and recheck around noon. Do not take any extra medications if bp is below 130/90

## 2017-05-22 NOTE — Progress Notes (Addendum)
Name: Erica Stevens   MRN: [id number]    DOB: 1951-08-20   Date:05/22/2017       Progress Note  Subjective  Chief Complaint  Chief Complaint  Patient presents with  . Hypertension    elevated B/P usually in the a.m. before medication is taken  . Referral    cardiology   . Memory Loss    short term memory loss pt daughter stated that she can not remember things from day to day       HPI  Uncontrolled HTN: she has been living with her daughter for the past month, she had not been compliant with bp medications until she moved in with her daughter one month ago. Her son in law is disabled and stays at home with her. She continues to smoke daily but has been drug free since May 2018 ( last admission for heroine overdose). She has been dealing with back pain and found to have a compression fracture T12 during visit to [facility] beginning of August. She uses a cane to help with her balance ( back pain causes balance problems ) she is having home PT and home health nurse has been checking bp that is reading very high in am's in the 190's.   CHF/CAD: not on Imdur at this time, denies chest pain, she denies SOB at this time or orthopnea. Denies wheezing.   Memory loss/Depression: she is not taking medication for depression, advised to return for MMS, and follow up with psychiatrist but she wants to hold off on seeing psychiatrist at this time. She feels better since moved in with her daughter, does not want to resume medication at this time. She denies suicidal thoughts or ideation.    Patient Active Problem List   Diagnosis Date Noted  . Slurred speech 03/01/2017  . Aspiration pneumonia ([place]) 01/13/2017  . Opiate overdose 01/13/2017  . Cocaine abuse 01/13/2017  . Benzodiazepine abuse 01/13/2017  . Heroin abuse 05/03/2016  . Anoxia 05/03/2016  . 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 Accidental heroin overdose 05/03/2016  . Severe recurrent major depression ([place]) 05/01/2016  . Acute respiratory  failure ([place]) 04/28/2016  . Angina pectoris ([place]) 02/24/2016  . Cervical disc disease 10/02/2015  . Affective disorder, major 07/02/2015  . Benign hypertensive heart disease with congestive heart failure ([place]) 07/02/2015  . Benign hypertensive heart disease 03/09/2015  . Migraine 03/09/2015  . Billowing mitral valve 03/09/2015  . Osteopetrosis 03/09/2015  . Urge incontinence 03/09/2015  . Chest pressure 06/21/2013  . Leg edema 06/21/2013  . Shortness of breath 06/21/2013  . CAD (coronary artery disease) of artery bypass graft 06/21/2013  . Hyperlipidemia 06/21/2013  . Chronic pain 03/25/2009  . Hypercholesterolemia without hypertriglyceridemia 05/22/2007  . Coronary atherosclerosis 05/22/2007  . 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 Brachial neuritis 12/29/2006    Past Surgical History:  Procedure Laterality Date  . ABDOMINAL HYSTERECTOMY    . BLADDER SURGERY    . BYPASS GRAFT    . CARDIAC CATHETERIZATION  2014   [facility]  . CHOLECYSTECTOMY    . COLONOSCOPY    . CORONARY ARTERY BYPASS GRAFT     x3 grafts;[facility]  . fasciotomy      Family History  Problem Relation Age of Onset  . Hypertension Mother   . Hyperlipidemia Mother   . Heart disease Mother     Social History   Social History  . Marital status: Divorced    Spouse name: N/A  . Number of children: N/A  .  Years of education: N/A   Occupational History  . Not on file.   Social History Main Topics  . Smoking status: Current Every Day Smoker    Packs/day: 1.00    Types: Cigarettes  . Smokeless tobacco: Never Used  . Alcohol use No  . Drug use: No  . Sexual activity: Not Currently   Other Topics Concern  . Not on file   Social History Narrative   She moved in with her daughter and grandchildren August 2018   Used to live by herself, divorced   Long history of depression, since delivery of her children         Current Outpatient Prescriptions:  .  amLODipine (NORVASC) 5 MG tablet, Take 1 tablet (5 mg  total) by mouth daily., Disp: 30 tablet, Rfl: 0 .  aspirin 81 MG tablet, Take 81 mg by mouth daily., Disp: , Rfl:  .  enalapril (VASOTEC) 5 MG tablet, Take 1 tablet (5 mg total) by mouth 2 (two) times daily., Disp: 30 tablet, Rfl: 0 .  isosorbide mononitrate (IMDUR) 30 MG 24 hr tablet, TAKE ONE (1) TABLET EACH DAY, Disp: 30 tablet, Rfl: 0 .  metoprolol succinate (TOPROL-XL) 25 MG 24 hr tablet, TAKE ONE (1) TABLET EACH DAY, Disp: 30 tablet, Rfl: 5 .  Naloxone HCl 0.4 MG/0.4ML SOAJ, Follow package instructions for heroin or opioid overdose., Disp: 1 Package, Rfl: 1  Allergies  Allergen Reactions  . Erythromycin   . Ivp Dye [Iodinated Diagnostic Agents]   . Sulfa Antibiotics     Stated turns orange  . Sulfur      ROS  Constitutional: Negative for fever or weight change.  Respiratory:Positive for cough and shortness of breath.   Cardiovascular: Negative for chest pain or palpitations.  Gastrointestinal: Negative for abdominal pain, no bowel changes.  Musculoskeletal: Positive for gait problem but no  joint swelling.  Skin: Negative for rash.  Neurological: Negative for dizziness or headache.  No other specific complaints in a complete review of systems (except as listed in HPI above).  Objective  Vitals:   05/22/17 1330  BP: (!) 148/78  Pulse: 68  Resp: 16  Temp: 98.1 F (36.7 C)  SpO2: 97%  Weight: 131 lb 6 oz (59.6 kg)  Height: 5' (1.524 m)    Body mass index is 25.66 kg/m.  Physical Exam  Constitutional: Patient appears well-developed and well-nourished. No distress.  HEENT: head atraumatic, normocephalic, pupils equal and reactive to light,  neck supple, throat within normal limits Cardiovascular: Normal rate, regular rhythm and normal heart sounds.  No murmur heard. No BLE edema. Pulmonary/Chest: Effort normal and breath sounds normal. No respiratory distress. Abdominal: Soft.  There is no tenderness. Psychiatric: Patient has a normal mood and affect. behavior is  normal. Judgment and thought content normal. Muscular Skeletal: using a cane, problems with her balance, pain during palpation of spinal process around T12  Recent Results (from the past 2160 hour(s))  CBC with Differential     Status: Abnormal   Collection Time: 02/28/17 11:11 PM  Result Value Ref Range   WBC 8.0 3.6 - 11.0 K/uL   RBC 4.30 3.80 - 5.20 MIL/uL   Hemoglobin 14.2 12.0 - 16.0 g/dL   HCT 41.3 35.0 - 47.0 %   MCV 96.2 80.0 - 100.0 fL   MCH 33.1 26.0 - 34.0 pg   MCHC 34.4 32.0 - 36.0 g/dL   RDW 14.4 11.5 - 14.5 %   Platelets 233 150 - 440 K/uL  Neutrophils Relative % 43 %   Neutro Abs 3.4 1.4 - 6.5 K/uL   Lymphocytes Relative 46 %   Lymphs Abs 3.7 (H) 1.0 - 3.6 K/uL   Monocytes Relative 7 %   Monocytes Absolute 0.6 0.2 - 0.9 K/uL   Eosinophils Relative 4 %   Eosinophils Absolute 0.3 0 - 0.7 K/uL   Basophils Relative 0 %   Basophils Absolute 0.0 0 - 0.1 K/uL  Basic metabolic panel     Status: None   Collection Time: 02/28/17 11:11 PM  Result Value Ref Range   Sodium 140 135 - 145 mmol/L   Potassium 3.8 3.5 - 5.1 mmol/L   Chloride 103 101 - 111 mmol/L   CO2 29 22 - 32 mmol/L   Glucose, Bld 97 65 - 99 mg/dL   BUN 13 6 - 20 mg/dL   Creatinine, Ser 0.87 0.44 - 1.00 mg/dL   Calcium 9.2 8.9 - 10.3 mg/dL   GFR calc non Af Amer >60 >60 mL/min   GFR calc Af Amer >60 >60 mL/min    Comment: (NOTE) The eGFR has been calculated using the CKD EPI equation. This calculation has not been validated in all clinical situations. eGFR's persistently <60 mL/min signify possible Chronic Kidney Disease.    Anion gap 8 5 - 15  Troponin I     Status: None   Collection Time: 02/28/17 11:11 PM  Result Value Ref Range   Troponin I <0.03 <0.03 ng/mL  Ethanol     Status: None   Collection Time: 02/28/17 11:11 PM  Result Value Ref Range   Alcohol, Ethyl (B) <5 <5 mg/dL    Comment:        LOWEST DETECTABLE LIMIT FOR SERUM ALCOHOL IS 5 mg/dL FOR MEDICAL PURPOSES ONLY   Urinalysis,  Complete w Microscopic     Status: Abnormal   Collection Time: 02/28/17 11:14 PM  Result Value Ref Range   Color, Urine YELLOW YELLOW   APPearance HAZY (A) CLEAR   Specific Gravity, Urine 1.020 1.005 - 1.030   pH 6.5 5.0 - 8.0   Glucose, UA NEGATIVE NEGATIVE mg/dL   Hgb urine dipstick SMALL (A) NEGATIVE   Bilirubin Urine NEGATIVE NEGATIVE   Ketones, ur NEGATIVE NEGATIVE mg/dL   Protein, ur NEGATIVE NEGATIVE mg/dL   Nitrite NEGATIVE NEGATIVE   Leukocytes, UA NEGATIVE NEGATIVE   Squamous Epithelial / LPF 0-5 (A) NONE SEEN   WBC, UA 0-5 0 - 5 WBC/hpf   RBC / HPF 0-5 0 - 5 RBC/hpf   Bacteria, UA NONE SEEN NONE SEEN   Mucus PRESENT   Urine Drug Screen, Qualitative ([facility] only)     Status: Abnormal   Collection Time: 02/28/17 11:14 PM  Result Value Ref Range   Tricyclic, Ur Screen POSITIVE (A) NONE DETECTED   Amphetamines, Ur Screen NONE DETECTED NONE DETECTED   MDMA (Ecstasy)Ur Screen NONE DETECTED NONE DETECTED   Cocaine Metabolite,Ur [place] NONE DETECTED NONE DETECTED   Opiate, Ur Screen NONE DETECTED NONE DETECTED   Phencyclidine (PCP) Ur S NONE DETECTED NONE DETECTED   Cannabinoid 50 Ng, Ur [place] NONE DETECTED NONE DETECTED   Barbiturates, Ur Screen NONE DETECTED NONE DETECTED   Benzodiazepine, Ur Scrn POSITIVE (A) NONE DETECTED   Methadone Scn, Ur NONE DETECTED NONE DETECTED    Comment: (NOTE) 604  Tricyclics, urine               Cutoff 1000 ng/mL 200  Amphetamines, urine  Cutoff 1000 ng/mL 300  MDMA (Ecstasy), urine           Cutoff 500 ng/mL 400  Cocaine Metabolite, urine       Cutoff 300 ng/mL 500  Opiate, urine                   Cutoff 300 ng/mL 600  Phencyclidine (PCP), urine      Cutoff 25 ng/mL 700  Cannabinoid, urine              Cutoff 50 ng/mL 800  Barbiturates, urine             Cutoff 200 ng/mL 900  Benzodiazepine, urine           Cutoff 200 ng/mL 1000 Methadone, urine                Cutoff 300 ng/mL 1100 1200 The urine drug screen provides only a  preliminary, unconfirmed 1300 analytical test result and should not be used for non-medical 1400 purposes. Clinical consideration and professional judgment should 1500 be applied to any positive drug screen result due to possible 1600 interfering substances. A more specific alternate chemical method 1700 must be used in order to obtain a confirmed analytical result.  1800 Gas chromato graphy / mass spectrometry (GC/MS) is the preferred 1900 confirmatory method.   Hemoglobin A1c     Status: None   Collection Time: 03/01/17  4:36 AM  Result Value Ref Range   Hgb A1c MFr Bld 5.0 4.8 - 5.6 %    Comment: (NOTE)         Pre-diabetes: 5.7 - 6.4         Diabetes: >6.4         Glycemic control for adults with diabetes: <7.0    Mean Plasma Glucose 97 mg/dL    Comment: (NOTE) Performed At: [facility] [place], [place] [id number] Lindon Romp MD [id number]   Lipid panel     Status: Abnormal   Collection Time: 03/01/17  4:36 AM  Result Value Ref Range   Cholesterol 228 (H) 0 - 200 mg/dL   Triglycerides 122 <150 mg/dL   HDL 48 >40 mg/dL   Total CHOL/HDL Ratio 4.8 RATIO   VLDL 24 0 - 40 mg/dL   LDL Cholesterol 156 (H) 0 - 99 mg/dL    Comment:        Total Cholesterol/HDL:CHD Risk Coronary Heart Disease Risk Table                     Men   Women  1/2 Average Risk   3.4   3.3  Average Risk       5.0   4.4  2 X Average Risk   9.6   7.1  3 X Average Risk  23.4   11.0        Use the calculated Patient Ratio above and the CHD Risk Table to determine the patient's CHD Risk.        ATP III CLASSIFICATION (LDL):  <100     mg/dL   Optimal  100-129  mg/dL   Near or Above                    Optimal  130-159  mg/dL   Borderline  160-189  mg/dL   High  >190     mg/dL   Very High   ECHOCARDIOGRAM COMPLETE     Status: None  Collection Time: 03/01/17  2:38 PM  Result Value Ref Range   Weight 2,124.8 oz   Height 60 in   BP 178/69 mmHg       PHQ2/9: Depression screen [facility] 2/9 02/24/2016 10/01/2015 08/20/2015 07/10/2015 07/02/2015  Decreased Interest 0 0 1 1 0  Down, Depressed, Hopeless 0 0 0 0 0  PHQ - 2 Score 0 0 1 1 0     Fall Risk: Fall Risk  05/22/2017 02/24/2016 10/01/2015 08/20/2015 07/23/2015  Falls in the past year? Yes No No - No  Number falls in past yr: 2 or more - - - -  Injury with Fall? Yes - - - -  Risk Factor Category  High Fall Risk - - - -  Risk for fall due to : History of fall(s) - - Impaired balance/gait;Impaired mobility -  Follow up Falls evaluation completed - - - -    Assessment & Plan  1. Uncontrolled hypertension  We will re-adjust medication, take bp medications at night and take extra pill in am based on values. Above 150/90 take one extra Norvasc and also Enalapril, if between 130-140/90 take just Enalapril and if below 120/80 do not take either medication  - amLODipine (NORVASC) 5 MG tablet; Take 1 tablet (5 mg total) by mouth daily.  Dispense: 60 tablet; Refill: 0 - Ambulatory referral to Cardiology  2. Need for influenza vaccination  - Flu vaccine HIGH DOSE PF (Fluzone High dose)  3. Recurrent major depression in partial remission ([place])  History of drug abuse, discussed seeing psychiatrist to resume antidepressants since she is in remission   4. Chronic systolic congestive heart failure (HCC)  - enalapril (VASOTEC) 10 MG tablet; Take 1 tablet (10 mg total) by mouth 2 (two) times daily.  Dispense: 30 tablet; Refill: 0 - metoprolol succinate (TOPROL-XL) 25 MG 24 hr tablet; TAKE ONE (1) TABLET EACH DAY  Dispense: 30 tablet; Refill: 0 - Ambulatory referral to Cardiology  5. History of drug dependence/abuse ([place])  Admitted at least 4 times for drug overdose, last admission 01/2017 - living with her daughter since and has been clean.   6. Dyslipidemia  - atorvastatin (LIPITOR) 80 MG tablet; Take 1 tablet (80 mg total) by mouth daily.  Dispense: 90 tablet; Refill: 3  7.  Atherosclerosis of aorta ([place])  - Ambulatory referral to Cardiology  8. Age related osteoporosis, unspecified pathological fracture presence  She wants to hold off on referral to Rheumatologist at this time

## 2017-05-23 DIAGNOSIS — I509 Heart failure, unspecified: Secondary | ICD-10-CM | POA: Diagnosis not present

## 2017-05-23 DIAGNOSIS — M810 Age-related osteoporosis without current pathological fracture: Secondary | ICD-10-CM | POA: Diagnosis not present

## 2017-05-23 DIAGNOSIS — E785 Hyperlipidemia, unspecified: Secondary | ICD-10-CM | POA: Diagnosis not present

## 2017-05-23 DIAGNOSIS — G8929 Other chronic pain: Secondary | ICD-10-CM | POA: Diagnosis not present

## 2017-05-23 DIAGNOSIS — F329 Major depressive disorder, single episode, unspecified: Secondary | ICD-10-CM | POA: Diagnosis not present

## 2017-05-23 DIAGNOSIS — S22080D Wedge compression fracture of T11-T12 vertebra, subsequent encounter for fracture with routine healing: Secondary | ICD-10-CM | POA: Diagnosis not present

## 2017-05-23 DIAGNOSIS — I2510 Atherosclerotic heart disease of native coronary artery without angina pectoris: Secondary | ICD-10-CM | POA: Diagnosis not present

## 2017-05-23 DIAGNOSIS — M40204 Unspecified kyphosis, thoracic region: Secondary | ICD-10-CM | POA: Diagnosis not present

## 2017-05-23 DIAGNOSIS — I110 Hypertensive heart disease with heart failure: Secondary | ICD-10-CM | POA: Diagnosis not present

## 2017-05-25 DIAGNOSIS — F329 Major depressive disorder, single episode, unspecified: Secondary | ICD-10-CM | POA: Diagnosis not present

## 2017-05-25 DIAGNOSIS — I2510 Atherosclerotic heart disease of native coronary artery without angina pectoris: Secondary | ICD-10-CM | POA: Diagnosis not present

## 2017-05-25 DIAGNOSIS — M40204 Unspecified kyphosis, thoracic region: Secondary | ICD-10-CM | POA: Diagnosis not present

## 2017-05-25 DIAGNOSIS — G8929 Other chronic pain: Secondary | ICD-10-CM | POA: Diagnosis not present

## 2017-05-25 DIAGNOSIS — I110 Hypertensive heart disease with heart failure: Secondary | ICD-10-CM | POA: Diagnosis not present

## 2017-05-25 DIAGNOSIS — S22080D Wedge compression fracture of T11-T12 vertebra, subsequent encounter for fracture with routine healing: Secondary | ICD-10-CM | POA: Diagnosis not present

## 2017-05-25 DIAGNOSIS — E785 Hyperlipidemia, unspecified: Secondary | ICD-10-CM | POA: Diagnosis not present

## 2017-05-25 DIAGNOSIS — M810 Age-related osteoporosis without current pathological fracture: Secondary | ICD-10-CM | POA: Diagnosis not present

## 2017-05-25 DIAGNOSIS — I509 Heart failure, unspecified: Secondary | ICD-10-CM | POA: Diagnosis not present

## 2017-05-30 DIAGNOSIS — R531 Weakness: Secondary | ICD-10-CM | POA: Diagnosis not present

## 2017-06-05 DIAGNOSIS — Z0100 Encounter for examination of eyes and vision without abnormal findings: Secondary | ICD-10-CM | POA: Diagnosis not present

## 2017-06-05 DIAGNOSIS — H524 Presbyopia: Secondary | ICD-10-CM | POA: Diagnosis not present

## 2017-06-08 DIAGNOSIS — M40204 Unspecified kyphosis, thoracic region: Secondary | ICD-10-CM | POA: Diagnosis not present

## 2017-06-08 DIAGNOSIS — M810 Age-related osteoporosis without current pathological fracture: Secondary | ICD-10-CM | POA: Diagnosis not present

## 2017-06-08 DIAGNOSIS — I509 Heart failure, unspecified: Secondary | ICD-10-CM | POA: Diagnosis not present

## 2017-06-08 DIAGNOSIS — E785 Hyperlipidemia, unspecified: Secondary | ICD-10-CM | POA: Diagnosis not present

## 2017-06-08 DIAGNOSIS — F329 Major depressive disorder, single episode, unspecified: Secondary | ICD-10-CM | POA: Diagnosis not present

## 2017-06-08 DIAGNOSIS — G8929 Other chronic pain: Secondary | ICD-10-CM | POA: Diagnosis not present

## 2017-06-08 DIAGNOSIS — I2510 Atherosclerotic heart disease of native coronary artery without angina pectoris: Secondary | ICD-10-CM | POA: Diagnosis not present

## 2017-06-08 DIAGNOSIS — I110 Hypertensive heart disease with heart failure: Secondary | ICD-10-CM | POA: Diagnosis not present

## 2017-06-08 DIAGNOSIS — S22080D Wedge compression fracture of T11-T12 vertebra, subsequent encounter for fracture with routine healing: Secondary | ICD-10-CM | POA: Diagnosis not present

## 2017-06-09 ENCOUNTER — Telehealth: Payer: Self-pay | Admitting: Family Medicine

## 2017-06-09 DIAGNOSIS — I5022 Chronic systolic (congestive) heart failure: Secondary | ICD-10-CM

## 2017-06-09 MED ORDER — ENALAPRIL MALEATE 10 MG PO TABS: 10.0000 mg | ORAL_TABLET | Freq: Two times a day (BID) | ORAL | 0 refills | Status: DC

## 2017-06-09 NOTE — Telephone Encounter (Signed)
Pt has upcoming appt with PCP. Additional 15 day supply is provided. Thanks!

## 2017-06-09 NOTE — Telephone Encounter (Signed)
Requesting refill on enalapril. On sept 10 the prescripiton was sent in for a 30 day supply however the patient takes it twice daily which means its only a 15 day supply. Please send to [facility] [address] [phone]

## 2017-06-09 NOTE — Telephone Encounter (Signed)
Pt informed

## 2017-06-14 NOTE — Telephone Encounter (Signed)
Please close chart

## 2017-06-19 ENCOUNTER — Encounter: Payer: Self-pay | Admitting: Family Medicine

## 2017-06-19 ENCOUNTER — Ambulatory Visit (INDEPENDENT_AMBULATORY_CARE_PROVIDER_SITE_OTHER): Payer: Medicare HMO | Admitting: Family Medicine

## 2017-06-19 VITALS — BP 150/70 | HR 79 | Resp 14 | Ht 60.0 in | Wt 131.2 lb

## 2017-06-19 DIAGNOSIS — Z23 Encounter for immunization: Secondary | ICD-10-CM | POA: Diagnosis not present

## 2017-06-19 DIAGNOSIS — I669 Occlusion and stenosis of unspecified cerebral artery: Secondary | ICD-10-CM | POA: Diagnosis not present

## 2017-06-19 DIAGNOSIS — R413 Other amnesia: Secondary | ICD-10-CM | POA: Diagnosis not present

## 2017-06-19 DIAGNOSIS — I10 Essential (primary) hypertension: Secondary | ICD-10-CM

## 2017-06-19 DIAGNOSIS — I5022 Chronic systolic (congestive) heart failure: Secondary | ICD-10-CM

## 2017-06-19 MED ORDER — ENALAPRIL MALEATE 10 MG PO TABS: 10.0000 mg | ORAL_TABLET | Freq: Two times a day (BID) | ORAL | 1 refills | Status: DC

## 2017-06-19 MED ORDER — METOPROLOL SUCCINATE ER 25 MG PO TB24: ORAL_TABLET | ORAL | 1 refills | Status: DC

## 2017-06-19 NOTE — Patient Instructions (Signed)

## 2017-06-19 NOTE — Progress Notes (Signed)
Name: Erica Stevens   MRN: [id number]    DOB: 1951-03-21   Date:06/19/2017       Progress Note  Subjective  Chief Complaint  Chief Complaint  Patient presents with  . Hypertension  . Memory Loss    HPI    Uncontrolled HTN: she has been living with her daughter since 04/2017, she had not been compliant with bp medications until she moved in with her daughter. Her son in law is disabled and son-in-law  stays at home with them. She continues to smoke daily but has been drug free since May 2018 ( last admission for heroine overdose). She has been dealing with back pain and found to have a compression fracture T12 during visit to [facility] beginning of August., she is taking Tylenol prn, using cane, finished rehab with PT and OT. She uses a cane to help with her balance ( back pain causes balance problems ). BP while checked by home healthy was high, today still not at goal, but improving.   CHF/CAD: not on Imdur at this time, denies chest pain, she denies SOB at this time or orthopnea. Denies wheezing. She will follow up with Dr. Lady Gary.    Memory loss/Depression: she is not taking medication for depression, normal MMS today, discussed evalaution by psychiatrist but she wants to hold off on seeing psychiatrist at this time. She feels better since moved in with her daughter, does not want to resume medication at this time. She denies suicidal thoughts or ideation.  She states stress is low at this time  Cerebral artery stenosis: found on MRI done [facility] 02/2017. She is on aspirin and Atorvastatin.   Patient Active Problem List   Diagnosis Date Noted  . Atherosclerosis of aorta (HCC) 05/22/2017  . Fracture of thoracic spine (HCC) 05/22/2017  . Slurred speech 03/01/2017  . Opiate overdose (HCC) 01/13/2017  . Cocaine abuse (HCC) 01/13/2017  . Benzodiazepine abuse (HCC) 01/13/2017  . Heroin abuse (HCC) 05/03/2016  . Anoxia 05/03/2016  . 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 Accidental heroin overdose  (HCC) 05/03/2016  . Severe recurrent major depression (HCC) 05/01/2016  . Angina pectoris (HCC) 02/24/2016  . Cervical disc disease 10/02/2015  . Affective disorder, major 07/02/2015  . Benign hypertensive heart disease 03/09/2015  . Migraine 03/09/2015  . Billowing mitral valve 03/09/2015  . Osteoporosis 03/09/2015  . Urge incontinence 03/09/2015  . Chest pressure 06/21/2013  . Leg edema 06/21/2013  . Shortness of breath 06/21/2013  . CAD (coronary artery disease) of artery bypass graft 06/21/2013  . Hyperlipidemia 06/21/2013  . Chronic pain 03/25/2009  . Hypercholesterolemia without hypertriglyceridemia 05/22/2007  . Coronary atherosclerosis 05/22/2007  . 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 Brachial neuritis 12/29/2006    Past Surgical History:  Procedure Laterality Date  . ABDOMINAL HYSTERECTOMY    . BLADDER SURGERY    . BYPASS GRAFT    . CARDIAC CATHETERIZATION  2014   [facility]  . CHOLECYSTECTOMY    . COLONOSCOPY    . CORONARY ARTERY BYPASS GRAFT     x3 grafts;[facility]  . fasciotomy      Family History  Problem Relation Age of Onset  . Hypertension Mother   . Hyperlipidemia Mother   . Heart disease Mother   . COPD Sister   . Depression Daughter   . Drug abuse Son     Social History   Social History  . Marital status: Divorced    Spouse name: N/A  . Number of children: N/A  .  Years of education: N/A   Occupational History  . Not on file.   Social History Main Topics  . Smoking status: Current Every Day Smoker    Packs/day: 1.00    Types: Cigarettes  . Smokeless tobacco: Never Used  . Alcohol use No  . Drug use: Yes    Types: Cocaine, Heroin, Hydrocodone, Benzodiazepines  . Sexual activity: Not Currently   Other Topics Concern  . Not on file   Social History Narrative   She moved in with her daughter and grandchildren August 2018   Used to live by herself, divorced   Long history of depression, since delivery of her children         Current  Outpatient Prescriptions:  .  amLODipine (NORVASC) 5 MG tablet, Take 1 tablet (5 mg total) by mouth daily., Disp: 60 tablet, Rfl: 0 .  aspirin 81 MG tablet, Take 81 mg by mouth daily., Disp: , Rfl:  .  enalapril (VASOTEC) 10 MG tablet, Take 1 tablet (10 mg total) by mouth 2 (two) times daily., Disp: 180 tablet, Rfl: 1 .  metoprolol succinate (TOPROL-XL) 25 MG 24 hr tablet, TAKE ONE (1) TABLET EACH DAY, Disp: 90 tablet, Rfl: 1 .  atorvastatin (LIPITOR) 80 MG tablet, Take 1 tablet (80 mg total) by mouth daily. (Patient not taking: Reported on 06/19/2017), Disp: 90 tablet, Rfl: 3 .  Naloxone HCl 0.4 MG/0.4ML SOAJ, Follow package instructions for heroin or opioid overdose. (Patient not taking: Reported on 06/19/2017), Disp: 1 Package, Rfl: 1  Allergies  Allergen Reactions  . Erythromycin   . Ivp Dye [Iodinated Diagnostic Agents]   . Sulfa Antibiotics     Stated turns orange  . Sulfur      ROS  Ten systems reviewed and is negative except as mentioned in HPI   Objective  Vitals:   06/19/17 1046  BP: (!) 150/70  Pulse: 79  Resp: 14  Weight: 131 lb 3.2 oz (59.5 kg)  Height: 5' (1.524 m)    Body mass index is 25.62 kg/m.  Physical Exam  Constitutional: Patient appears well-developed and well-nourished.  No distress.  HEENT: head atraumatic, normocephalic, pupils equal and reactive to light, neck supple, throat within normal limits Cardiovascular: Normal rate, regular rhythm and normal heart sounds.  No murmur heard. No BLE edema. Pulmonary/Chest: Effort normal and breath sounds normal. No respiratory distress. Abdominal: Soft.  There is no tenderness. Psychiatric: Patient has a normal mood and affect. behavior is normal. Judgment and thought content normal.  PHQ2/9: Depression screen [facility] 2/9 02/24/2016 10/01/2015 08/20/2015 07/10/2015 07/02/2015  Decreased Interest 0 0 1 1 0  Down, Depressed, Hopeless 0 0 0 0 0  PHQ - 2 Score 0 0 1 1 0     Fall Risk: Fall Risk  06/19/2017  05/22/2017 02/24/2016 10/01/2015 08/20/2015  Falls in the past year? Yes Yes No No -  Number falls in past yr: 2 or more 2 or more - - -  Injury with Fall? Yes Yes - - -  Risk Factor Category  High Fall Risk High Fall Risk - - -  Risk for fall due to : History of fall(s) History of fall(s) - - Impaired balance/gait;Impaired mobility  Follow up Education provided Falls evaluation completed - - -     Assessment & Plan  1. Memory changes  - Ambulatory referral to Neurology  2. Chronic systolic congestive heart failure (HCC)  Doing well at this time, we will make sure she gets appointment with  Dr. Lady Gary - enalapril (VASOTEC) 10 MG tablet; Take 1 tablet (10 mg total) by mouth 2 (two) times daily.  Dispense: 180 tablet; Refill: 1 - metoprolol succinate (TOPROL-XL) 25 MG 24 hr tablet; TAKE ONE (1) TABLET EACH DAY  Dispense: 90 tablet; Refill: 1  3. Occlusion and stenosis of cerebral artery  - Ambulatory referral to Neurology

## 2017-06-20 ENCOUNTER — Encounter: Payer: Self-pay | Admitting: Family Medicine

## 2017-06-21 DIAGNOSIS — I6523 Occlusion and stenosis of bilateral carotid arteries: Secondary | ICD-10-CM | POA: Diagnosis not present

## 2017-06-21 DIAGNOSIS — I341 Nonrheumatic mitral (valve) prolapse: Secondary | ICD-10-CM | POA: Diagnosis not present

## 2017-06-21 DIAGNOSIS — I25708 Atherosclerosis of coronary artery bypass graft(s), unspecified, with other forms of angina pectoris: Secondary | ICD-10-CM | POA: Diagnosis not present

## 2017-06-21 DIAGNOSIS — E7801 Familial hypercholesterolemia: Secondary | ICD-10-CM | POA: Diagnosis not present

## 2017-06-21 DIAGNOSIS — I779 Disorder of arteries and arterioles, unspecified: Secondary | ICD-10-CM | POA: Diagnosis not present

## 2017-06-21 DIAGNOSIS — I119 Hypertensive heart disease without heart failure: Secondary | ICD-10-CM | POA: Diagnosis not present

## 2017-06-21 DIAGNOSIS — I209 Angina pectoris, unspecified: Secondary | ICD-10-CM | POA: Diagnosis not present

## 2017-06-29 DIAGNOSIS — R531 Weakness: Secondary | ICD-10-CM | POA: Diagnosis not present

## 2017-07-30 DIAGNOSIS — R531 Weakness: Secondary | ICD-10-CM | POA: Diagnosis not present

## 2017-08-29 DIAGNOSIS — R531 Weakness: Secondary | ICD-10-CM | POA: Diagnosis not present

## 2017-09-29 DIAGNOSIS — R531 Weakness: Secondary | ICD-10-CM | POA: Diagnosis not present

## 2017-10-02 ENCOUNTER — Other Ambulatory Visit: Payer: Self-pay | Admitting: Family Medicine

## 2017-10-02 ENCOUNTER — Encounter: Payer: Self-pay | Admitting: Diagnostic Neuroimaging

## 2017-10-02 ENCOUNTER — Ambulatory Visit (INDEPENDENT_AMBULATORY_CARE_PROVIDER_SITE_OTHER): Payer: Medicare HMO | Admitting: Diagnostic Neuroimaging

## 2017-10-02 VITALS — BP 217/101 | HR 73 | Ht 60.0 in | Wt 140.2 lb

## 2017-10-02 DIAGNOSIS — I10 Essential (primary) hypertension: Secondary | ICD-10-CM

## 2017-10-02 DIAGNOSIS — E785 Hyperlipidemia, unspecified: Secondary | ICD-10-CM

## 2017-10-02 DIAGNOSIS — F0390 Unspecified dementia without behavioral disturbance: Secondary | ICD-10-CM | POA: Diagnosis not present

## 2017-10-02 DIAGNOSIS — I6522 Occlusion and stenosis of left carotid artery: Secondary | ICD-10-CM

## 2017-10-02 DIAGNOSIS — R413 Other amnesia: Secondary | ICD-10-CM

## 2017-10-02 DIAGNOSIS — F03A0 Unspecified dementia, mild, without behavioral disturbance, psychotic disturbance, mood disturbance, and anxiety: Secondary | ICD-10-CM

## 2017-10-02 DIAGNOSIS — I672 Cerebral atherosclerosis: Secondary | ICD-10-CM

## 2017-10-02 MED ORDER — ATORVASTATIN CALCIUM 80 MG PO TABS: 80.0000 mg | ORAL_TABLET | Freq: Every day | ORAL | 1 refills | Status: DC

## 2017-10-02 MED ORDER — AMLODIPINE BESYLATE 5 MG PO TABS: 5.0000 mg | ORAL_TABLET | Freq: Every day | ORAL | 1 refills | Status: DC

## 2017-10-02 NOTE — Patient Instructions (Signed)
  MEMORY LOSS - safety supervision strategies reviewed - treat vascular risk factors (smoking, BP, lipids) - treat underlying mood disorders (depression, anxiety)  INTRACRANIAL ATHEROSCLEROSIS (asymptomatic) - continue aspirin 81mg  daily - continue BP control - continue statin  LEFT ICA STENOSIS (50-69%; asymptomatic) - follow up with vascular surgery (ordered by Dr. Lady GaryFath) for monitoring; then consider repeat carotid u/s every 6-12 months; consider CTA neck to better define anatomy and degree of stenosis - aggressive risk factor management as above

## 2017-10-02 NOTE — Progress Notes (Signed)
[facility]  PATIENT: Erica Stevens DOB: 10-05-1950  REFERRING CLINICIAN:  Luther HearingK Sowles, MD  HISTORY FROM: patient and daughter  REASON FOR VISIT: new consult    HISTORICAL  CHIEF COMPLAINT:  Chief Complaint  Patient presents with  . NP Sowles Internal Referral  . Memory Loss    HISTORY OF PRESENT ILLNESS:   67 year old female here for evaluation of memory loss.  Patient reports short-term memory loss and confusion since December 2018.  According to daughter this problem is been going on for at least one year.  Patient was living alone until summer 2018 when she fell down, went to the hospital and was going to be referred to inpatient rehabilitation.  Patient did not want to go this route and therefore she moved in with her daughter.  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  Patient has been abstinent since at least August 2018.  Daughter noted more memory problems and confusion.  Since November 2018 patient has been living with her own mother who had some medical problems, and patient has not been living with daughter.  Patient forgets to take her medications.  She repeats herself.  She has trouble remembering appointments.  Today blood pressure significantly elevated at 217/101.  Patient continues to smoke cigarettes.  She has poorly controlled hypertension.  She has history of depression and anxiety.  She has history of chronic pain.  In course of workup patient was found to have significant brain atrophy as well as intracranial and extracranial stenosis.  Patient referred here for further evaluation of vascular problems as well.   REVIEW OF SYSTEMS: Full 14 system review of systems performed and negative with exception of: Memory loss confusion headache slurred speech dizziness joint pain cramps aching muscles incontinence hearing loss spinning sensation.  ALLERGIES: Allergies  Allergen Reactions  . Erythromycin   . Ivp Dye [Iodinated  Diagnostic Agents]   . Sulfa Antibiotics     Stated turns orange  . Sulfur     HOME MEDICATIONS: Outpatient Medications Prior to Visit  Medication Sig Dispense Refill  . amLODipine (NORVASC) 5 MG tablet Take 1 tablet (5 mg total) by mouth daily. 60 tablet 0  . aspirin 81 MG tablet Take 81 mg by mouth daily.    Marland Kitchen. atorvastatin (LIPITOR) 80 MG tablet Take 1 tablet (80 mg total) by mouth daily. 90 tablet 3  . enalapril (VASOTEC) 10 MG tablet Take 1 tablet (10 mg total) by mouth 2 (two) times daily. 180 tablet 1  . metoprolol succinate (TOPROL-XL) 25 MG 24 hr tablet TAKE ONE (1) TABLET EACH DAY 90 tablet 1  . Naloxone HCl 0.4 MG/0.4ML SOAJ Follow package instructions for heroin or opioid overdose. 1 Package 1   No facility-administered medications prior to visit.     PAST MEDICAL HISTORY: Past Medical History:  Diagnosis Date  . Allergic rhinitis due to pollen   . Anxiety   . Brachial neuritis or radiculitis NOS   . Cellulitis and abscess of unspecified site   . Chronic kidney disease, stage I   . Chronic pain syndrome   . Congestive heart failure, unspecified   . Coronary atherosclerosis of unspecified type of vessel, native or graft   . Degeneration of cervical intervertebral disc   . Depression   . Hypertension   . Migraine, unspecified, without mention of intractable migraine without mention of status migrainosus   . 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 Osteoporosis, unspecified   . Pure hypercholesterolemia   .  Reflux esophagitis   . Thoracic or lumbosacral neuritis or radiculitis, unspecified   . Unspecified urinary incontinence     PAST SURGICAL HISTORY: Past Surgical History:  Procedure Laterality Date  . ABDOMINAL HYSTERECTOMY    . BLADDER SURGERY    . BYPASS GRAFT    . CARDIAC CATHETERIZATION  2014   [facility]  . CHOLECYSTECTOMY    . COLONOSCOPY    . CORONARY ARTERY BYPASS GRAFT     x3 grafts;[facility]  . fasciotomy      FAMILY HISTORY: Family History  Problem  Relation Age of Onset  . Hypertension Mother   . Hyperlipidemia Mother   . Heart disease Mother   . COPD Sister   . Depression Daughter   . Drug abuse Son     SOCIAL HISTORY:  Social History   Socioeconomic History  . Marital status: Divorced    Spouse name: Not on file  . Number of children: Not on file  . Years of education: Not on file  . Highest education level: Not on file  Social Needs  . Financial resource strain: Not on file  . Food insecurity - worry: Not on file  . Food insecurity - inability: Not on file  . Transportation needs - medical: Not on file  . Transportation needs - non-medical: Not on file  Occupational History  . Not on file  Tobacco Use  . Smoking status: Current Every Day Smoker    Packs/day: 1.00    Types: Cigarettes  . Smokeless tobacco: Never Used  Substance and Sexual Activity  . Alcohol use: No  . Drug use: Yes    Types: Cocaine, Heroin, Hydrocodone, Benzodiazepines    Comment: quit 04-2017  . Sexual activity: Not Currently  Other Topics Concern  . Not on file  Social History Narrative   She moved in with her daughter and grandchildren August 2018.  Now with her mother. 10-02-17.   Used to live by herself, divorced, has 2 children.  Education: HS.     Long history of depression, since delivery of her children         PHYSICAL EXAM  GENERAL EXAM/CONSTITUTIONAL: Vitals:  Vitals:   10/02/17 1018  BP: (!) 217/101  Pulse: 73  Weight: 140 lb 3.2 oz (63.6 kg)  Height: 5' (1.524 m)     Body mass index is 27.38 kg/m.  No exam data present  Patient is in no distress; well developed, nourished and groomed; neck is supple  CARDIOVASCULAR:  Examination of carotid arteries is normal; no carotid bruits  Regular rate and rhythm, no murmurs  Examination of peripheral vascular system by observation and palpation is normal  EYES:  Ophthalmoscopic exam of optic discs and posterior segments is normal; no papilledema or  hemorrhages  MUSCULOSKELETAL:  Gait, strength, tone, movements noted in Neurologic exam below  NEUROLOGIC: MENTAL STATUS:  MMSE - Mini Mental State Exam 10/02/2017 06/19/2017  Orientation to time 3 5  Orientation to Place 3 5  Registration 3 3  Attention/ Calculation 1 5  Recall 3 3  Language- name 2 objects 2 2  Language- repeat 1 1  Language- follow 3 step command 3 3  Language- read & follow direction 1 1  Write a sentence 0 1  Copy design 1 1  Total score 21 30    awake, alert, oriented to person; NOT PLACE OR TIME  DECR memory  DECR attention and concentration  language fluent, comprehension intact, naming intact,   fund of knowledge  appropriate  DECR INSIGHT  CRANIAL NERVE:   2nd - no papilledema on fundoscopic exam  2nd, 3rd, 4th, 6th - pupils equal and reactive to light, visual fields full to confrontation, extraocular muscles intact, no nystagmus  5th - facial sensation symmetric  7th - facial strength symmetric  8th - hearing intact  9th - palate elevates symmetrically, uvula midline  11th - shoulder shrug symmetric  12th - tongue protrusion midline  MOTOR:   normal bulk and tone, full strength in the BUE, BLE  SENSORY:   normal and symmetric to light touch, temperature, vibration; DECR IN LEFT ARM AND LEG  COORDINATION:   finger-nose-finger, fine finger movements normal  REFLEXES:   deep tendon reflexes present and symmetric; SLIGHTLY BRISK IN LEFT ARM  GAIT/STATION:   narrow based gait; USES SINGLE POINT CANE; SLOW AND UNSTEADY    DIAGNOSTIC DATA (LABS, IMAGING, TESTING) - I reviewed patient records, labs, notes, testing and imaging myself where available.  Lab Results  Component Value Date   WBC 8.0 02/28/2017   HGB 14.2 02/28/2017   HCT 41.3 02/28/2017   MCV 96.2 02/28/2017   PLT 233 02/28/2017      Component Value Date/Time   NA 140 02/28/2017 2311   NA 140 10/01/2015 1238   NA 139 12/15/2012 0124   K 3.8  02/28/2017 2311   K 2.9 (L) 12/15/2012 0124   CL 103 02/28/2017 2311   CL 104 12/15/2012 0124   CO2 29 02/28/2017 2311   CO2 28 12/15/2012 0124   GLUCOSE 97 02/28/2017 2311   GLUCOSE 112 (H) 12/15/2012 0124   BUN 13 02/28/2017 2311   BUN 9 10/01/2015 1238   BUN 9 12/15/2012 0124   CREATININE 0.87 02/28/2017 2311   CREATININE 0.99 12/15/2012 0124   CALCIUM 9.2 02/28/2017 2311   CALCIUM 8.5 12/15/2012 0124   PROT 8.3 (H) 01/12/2017 2252   PROT 7.5 10/01/2015 1238   PROT 7.7 12/15/2012 0124   ALBUMIN 4.1 01/12/2017 2252   ALBUMIN 4.1 10/01/2015 1238   ALBUMIN 3.4 12/15/2012 0124   AST 47 (H) 01/12/2017 2252   AST 54 (H) 12/15/2012 0124   ALT 23 01/12/2017 2252   ALT 21 12/15/2012 0124   ALKPHOS 81 01/12/2017 2252   ALKPHOS 103 12/15/2012 0124   BILITOT 0.8 01/12/2017 2252   BILITOT 0.6 10/01/2015 1238   BILITOT 0.6 12/15/2012 0124   GFRNONAA >60 02/28/2017 2311   GFRNONAA >60 12/15/2012 0124   GFRAA >60 02/28/2017 2311   GFRAA >60 12/15/2012 0124   Lab Results  Component Value Date   CHOL 228 (H) 03/01/2017   HDL 48 03/01/2017   LDLCALC 156 (H) 03/01/2017   TRIG 122 03/01/2017   CHOLHDL 4.8 03/01/2017   Lab Results  Component Value Date   HGBA1C 5.0 03/01/2017   No results found for: [id number] Lab Results  Component Value Date   TSH 2.110 10/01/2015     03/01/17 MRI / MRA brain [I reviewed images myself and agree with interpretation. Moderate-severe mesial temporal atrophy. -VRP]  - Negative for acute infarct. - Atrophy and mild chronic microvascular ischemia - Occlusion of the right posterior cerebral artery and severe stenosis of the left posterior cerebral artery. Multiple areas of stenosis in left middle cerebral artery branches.  03/01/17 carotid u/s - Less than 50% stenosis in the right internal carotid artery. - 50-69% stenosis in the left internal carotid artery.     ASSESSMENT AND PLAN  67 y.o. year old female here  with gradual onset  progressive short-term memory loss, confusion, repeating herself, forgetting appointments, difficulty managing her own ADLs.  Also with significant tobacco abuse, prior opiate and cocaine abuse, uncontrolled hypertension.   Ddx memory loss: mild dementia, depression, anxiety, stress  1. Memory loss   2. Mild dementia   3. Intracranial atherosclerosis   4. More than 50 percent stenosis of left internal carotid artery      PLAN:  MEMORY LOSS (probable mild dementia; depression/anxiety and pain may also be factors) - safety supervision strategies reviewed; patient will move back in home with her daughter - treat vascular risk factors (smoking, BP, lipids) - treat underlying mood disorders (depression, anxiety)  INTRACRANIAL ATHEROSCLEROSIS (asymptomatic) - continue aspirin 81mg  daily - continue BP control - continue statin  LEFT ICA STENOSIS (50-69%; asymptomatic) - follow up with vascular surgery (ordered by Dr. Lady Gary) for monitoring; then consider repeat carotid u/s every 6-12 months; consider CTA neck to better define anatomy and degree of stenosis - aggressive risk factor management as above  Return in about 6 months (around 04/01/2018).  I reviewed images, labs, notes, records myself. I summarized findings and reviewed with patient, for this high risk condition (memory loss, dementia, intracranial atherosclerosis) requiring high complexity decision making.    Suanne Marker, MD 10/02/2017, 10:35 AM Certified in Neurology, Neurophysiology and Neuroimaging  [facility] [address], [address] [place], [place] [address] [phone]

## 2017-10-02 NOTE — Telephone Encounter (Signed)
Refill request for Hypertension medication:  Amlodipine 5 mg  Last office visit pertaining to hypertension: 06/19/2017  BP Readings from Last 3 Encounters:  10/02/17 (!) 217/101  06/19/17 (!) 150/70  05/22/17 (!) 148/78     Lab Results  Component Value Date   CREATININE 0.87 02/28/2017   BUN 13 02/28/2017   NA 140 02/28/2017   K 3.8 02/28/2017   CL 103 02/28/2017   CO2 29 02/28/2017   No Follow-up on file.  Refill Request for Cholesterol medication. Lipitor 80 mg  Last physical: None indicated  Lab Results  Component Value Date   CHOL 228 (H) 03/01/2017   HDL 48 03/01/2017   LDLCALC 156 (H) 03/01/2017   TRIG 122 03/01/2017   CHOLHDL 4.8 03/01/2017    @AFUTAPPT @

## 2017-10-02 NOTE — Telephone Encounter (Signed)
Patient requesting lipitor and amlodipine.

## 2017-10-02 NOTE — Telephone Encounter (Signed)
Copied from CRM #[id number]. Topic: Quick Communication - Rx Refill/Question >> Oct 02, 2017  2:12 PM Laural BenesJohnson, Louisianahiquita C wrote: Medication: atorvastatin (LIPITOR) 80, atorvastatin (LIPITOR) 80,  amLODipine (NORVASC) 5    Has the patient contacted their pharmacy? no   (Agent: If no, request that the patient contact the pharmacy for the refill.)   Preferred Pharmacy (with phone number or street name): [id number] - [place], [place] - [address]   Agent: Please be advised that RX refills may take up to 3 business days. We ask that you follow-up with your pharmacy.

## 2017-10-30 DIAGNOSIS — R531 Weakness: Secondary | ICD-10-CM | POA: Diagnosis not present

## 2017-11-27 DIAGNOSIS — R531 Weakness: Secondary | ICD-10-CM | POA: Diagnosis not present

## 2017-12-13 ENCOUNTER — Telehealth: Payer: Self-pay

## 2017-12-13 NOTE — Telephone Encounter (Signed)
Copied from CRM [phone]. Topic: Referral - Question >> Dec 13, 2017 10:59 AM Arlyss Gandyichardson, Taren N, NT wrote: Reason for CRM: Donn Pierinionna O [facility] is wanting to see if they can use [facility]PI for the pt coming in for a 6 month follow-up. CB#: [phone]   Please advise.

## 2017-12-13 NOTE — Telephone Encounter (Signed)
Not seen since 07/01/17. Needs follow up.

## 2017-12-14 NOTE — Telephone Encounter (Signed)
Per Dr. Carlynn PurlSowles, I called Donn Pierinionna O. back with [facility] and informed her that this patient has to f/u here first prior to receiving our NPI. She stated that she will let this patient know.

## 2017-12-14 NOTE — Telephone Encounter (Signed)
complete

## 2017-12-24 ENCOUNTER — Other Ambulatory Visit: Payer: Self-pay | Admitting: Family Medicine

## 2017-12-24 DIAGNOSIS — I5022 Chronic systolic (congestive) heart failure: Secondary | ICD-10-CM

## 2017-12-28 DIAGNOSIS — R531 Weakness: Secondary | ICD-10-CM | POA: Diagnosis not present

## 2018-01-27 DIAGNOSIS — R531 Weakness: Secondary | ICD-10-CM | POA: Diagnosis not present

## 2018-02-27 DIAGNOSIS — R531 Weakness: Secondary | ICD-10-CM | POA: Diagnosis not present

## 2018-03-29 DIAGNOSIS — R531 Weakness: Secondary | ICD-10-CM | POA: Diagnosis not present

## 2018-04-02 ENCOUNTER — Telehealth: Payer: Self-pay | Admitting: *Deleted

## 2018-04-02 ENCOUNTER — Ambulatory Visit: Payer: Medicare HMO | Admitting: Diagnostic Neuroimaging

## 2018-04-02 NOTE — Telephone Encounter (Signed)
Pt no showed appt for today.  

## 2018-04-04 ENCOUNTER — Encounter: Payer: Self-pay | Admitting: Diagnostic Neuroimaging

## 2018-04-24 IMAGING — CT CT HEAD W/O CM
3 series · 15 of 47 positions shown, 18 images · non-contrast
Comparison: CT of the head performed 01/12/2017

CLINICAL DATA: Status post fall, with lethargy and slurred speech,
acute onset. Concern for head injury. Initial encounter.

EXAM:
CT HEAD WITHOUT CONTRAST
TECHNIQUE: Contiguous axial images were obtained from the base of the skull
through the vertex without intravenous contrast.

[Series 2: head wo · axial · 0.40mm/px · z∈[-81,+44]mm · 9 of 31 slices shown, 12 images]
[im 3/31  brain]
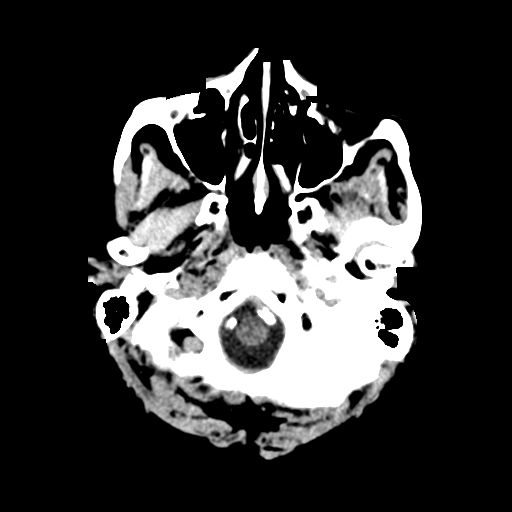
[im 3/31  bone]
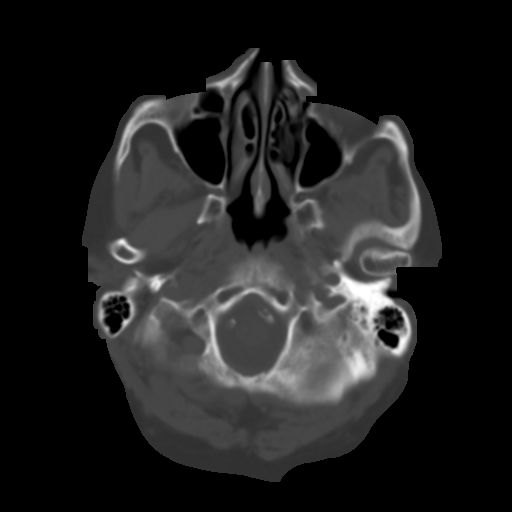
[im 6/31  brain]
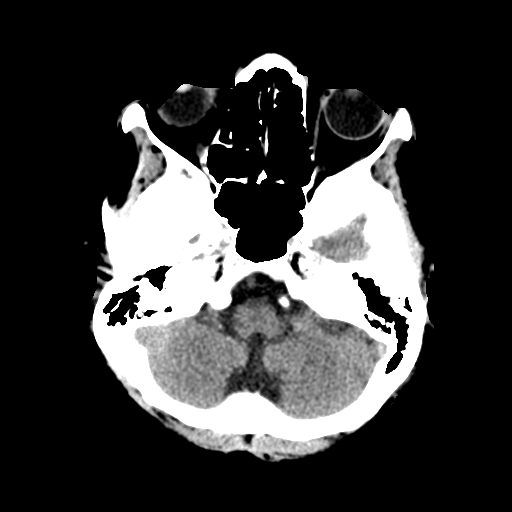
[im 9/31  brain]
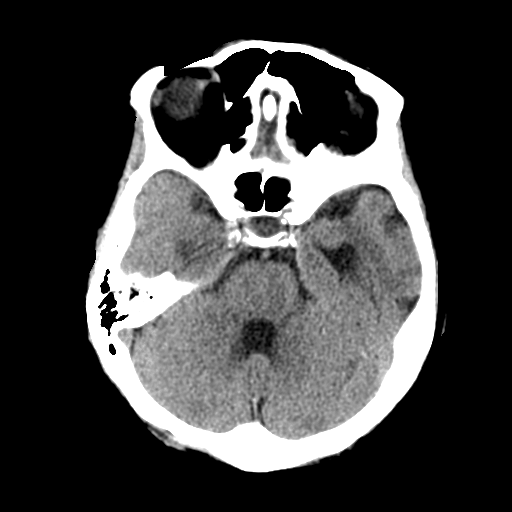
[im 12/31  brain]
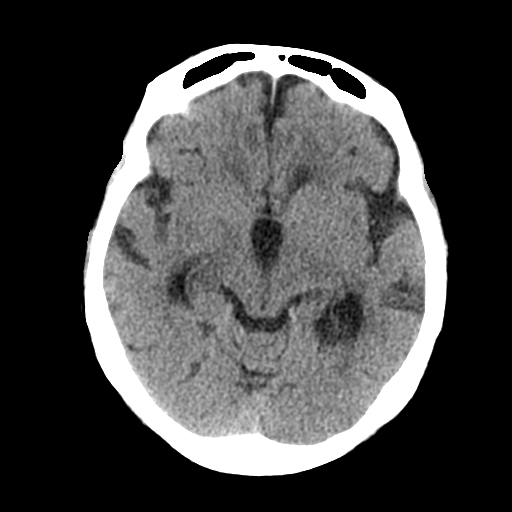
[im 16/31  brain]
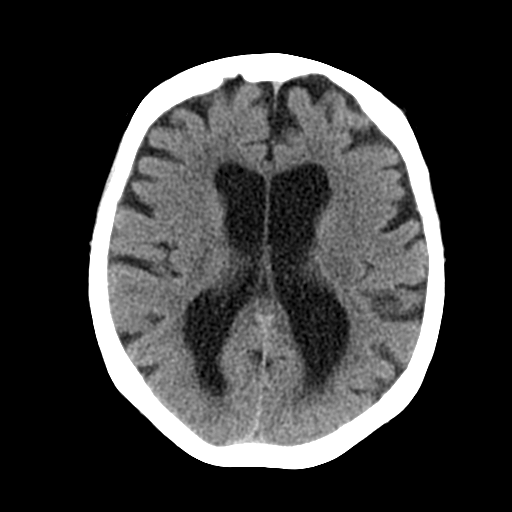
[im 16/31  bone]
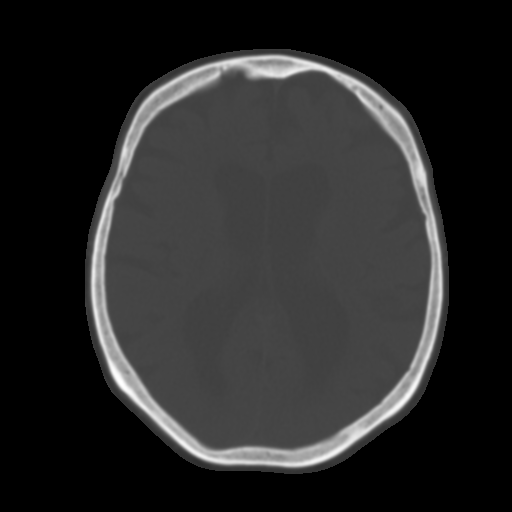
[im 19/31  brain]
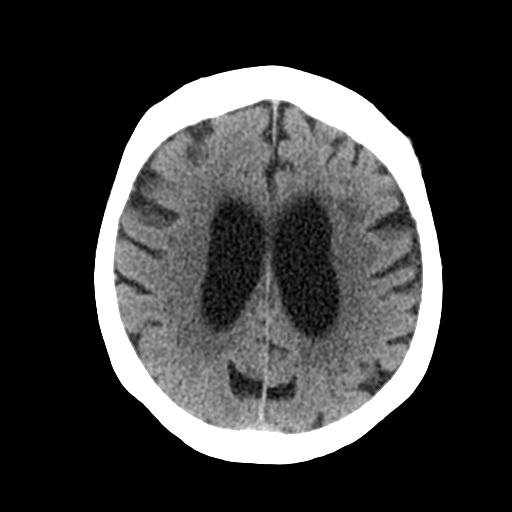
[im 22/31  brain]
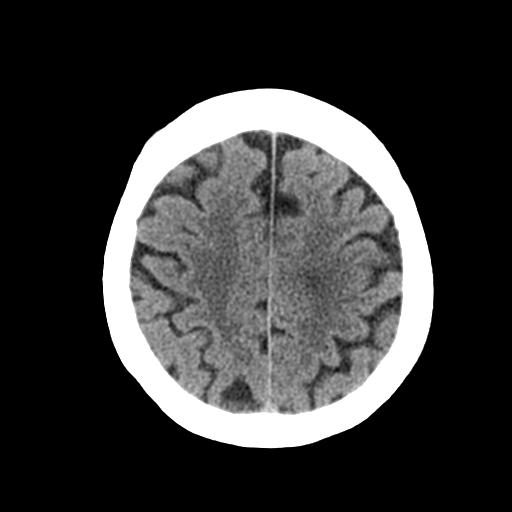
[im 25/31  brain]
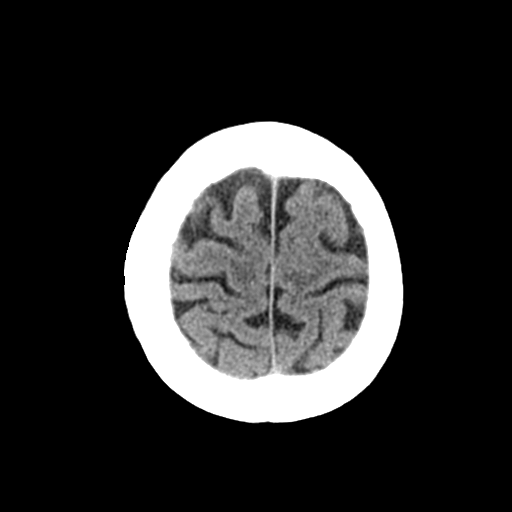
[im 28/31  brain]
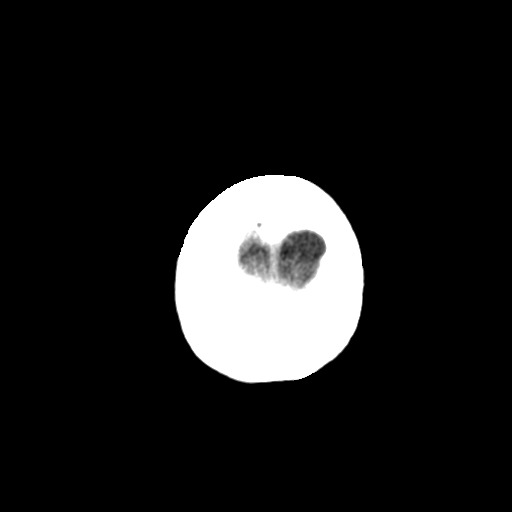
[im 28/31  bone]
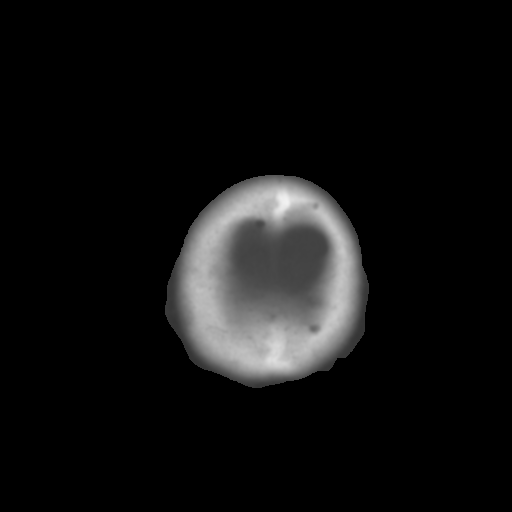

[Series 4: coronal soft tissue · coronal · 0.31mm/px · 3 of 60 slices shown]
[im 20/60  brain]
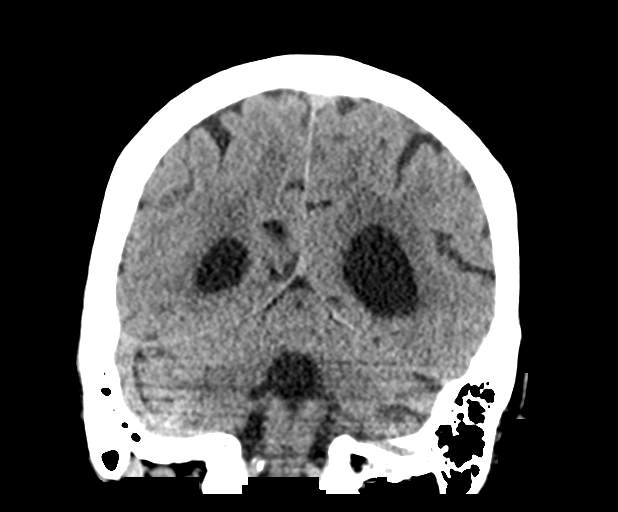
[im 27/60  brain]
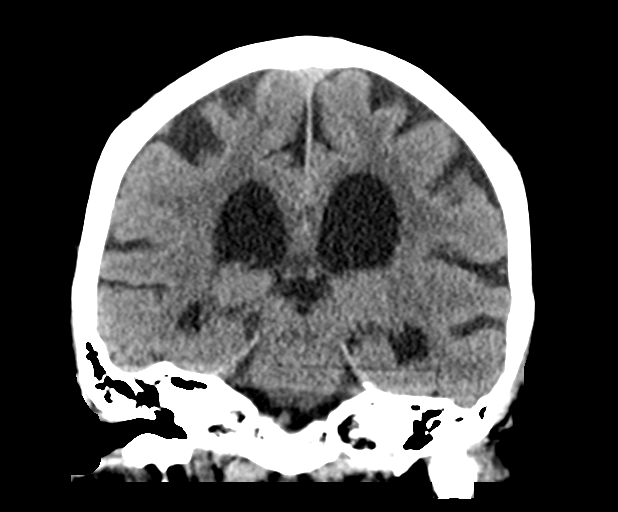
[im 33/60  brain]
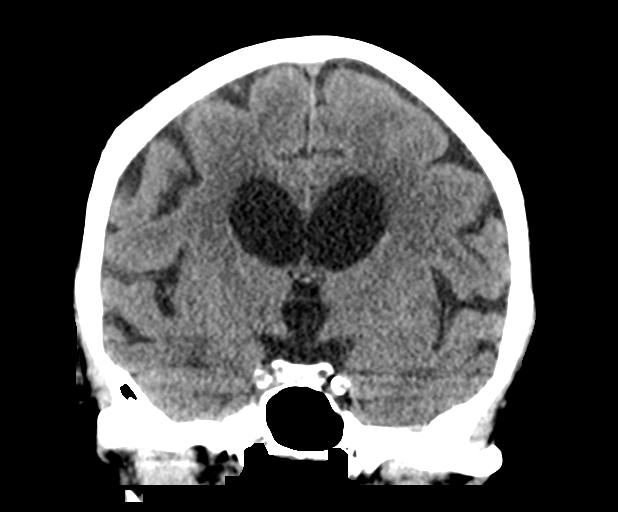

[Series 5: sagittal soft tissue · sagittal · 0.31mm/px · 3 of 50 slices shown]
[im 17/50  brain]
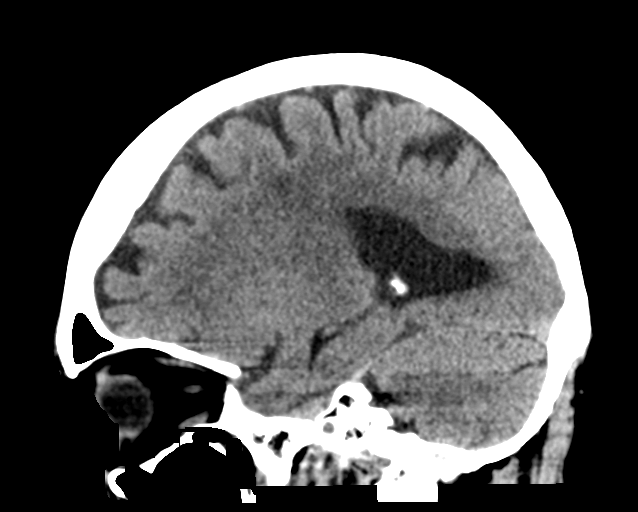
[im 25/50  brain]
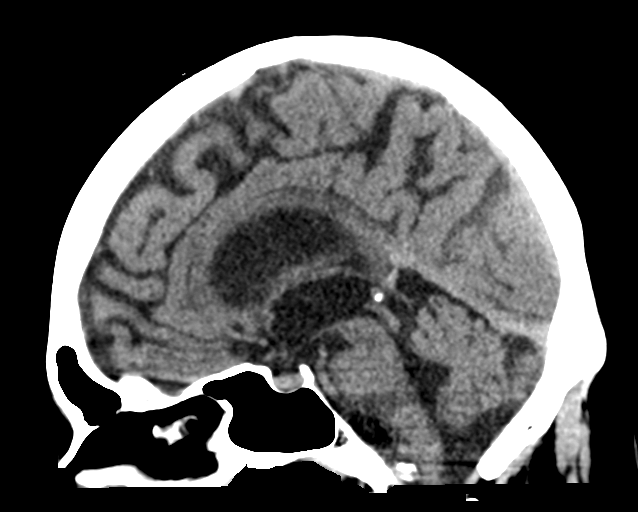
[im 33/50  brain]
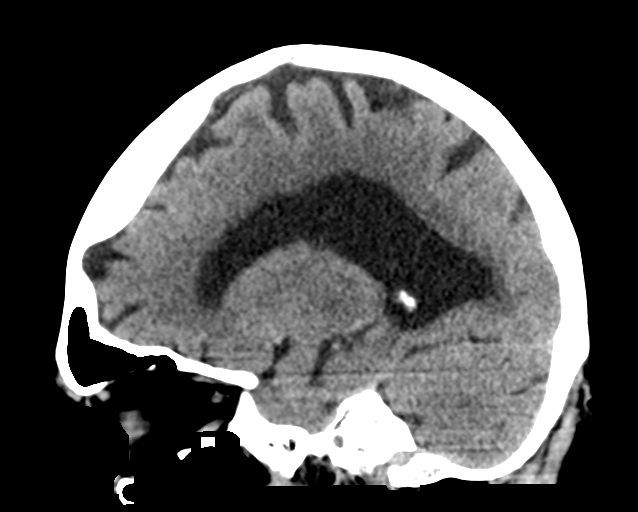

[15 of 47 positions shown; findings below may reference images not displayed]

FINDINGS: Brain: No evidence of acute infarction, hemorrhage, hydrocephalus,
extra-axial collection or mass lesion/mass effect.

Prominence of the ventricles and sulci reflects moderate cortical
volume loss. Mild subcortical white matter change likely reflects
small vessel ischemic microangiopathy.

The brainstem and fourth ventricle are within normal limits. The
basal ganglia are unremarkable in appearance. The cerebral
hemispheres demonstrate grossly normal gray-white differentiation.
No mass effect or midline shift is seen.

Vascular: No hyperdense vessel or unexpected calcification.

Skull: There is no evidence of fracture; visualized osseous
structures are unremarkable in appearance.

Sinuses/Orbits: The orbits are within normal limits. The paranasal
sinuses and mastoid air cells are well-aerated.

Other: No significant soft tissue abnormalities are seen.
IMPRESSION: 1. No evidence of traumatic intracranial injury or fracture.
2. Moderate cortical volume loss and mild small vessel ischemic
microangiopathy.

## 2018-04-24 IMAGING — DX DG CHEST 1V
1 series · 1 of 1 positions shown · non-contrast
Comparison: 02/25/2017 chest radiograph.

CLINICAL DATA: 65 y/o  F; slurred speech.

EXAM:
CHEST 1 VIEW

[chest ap]
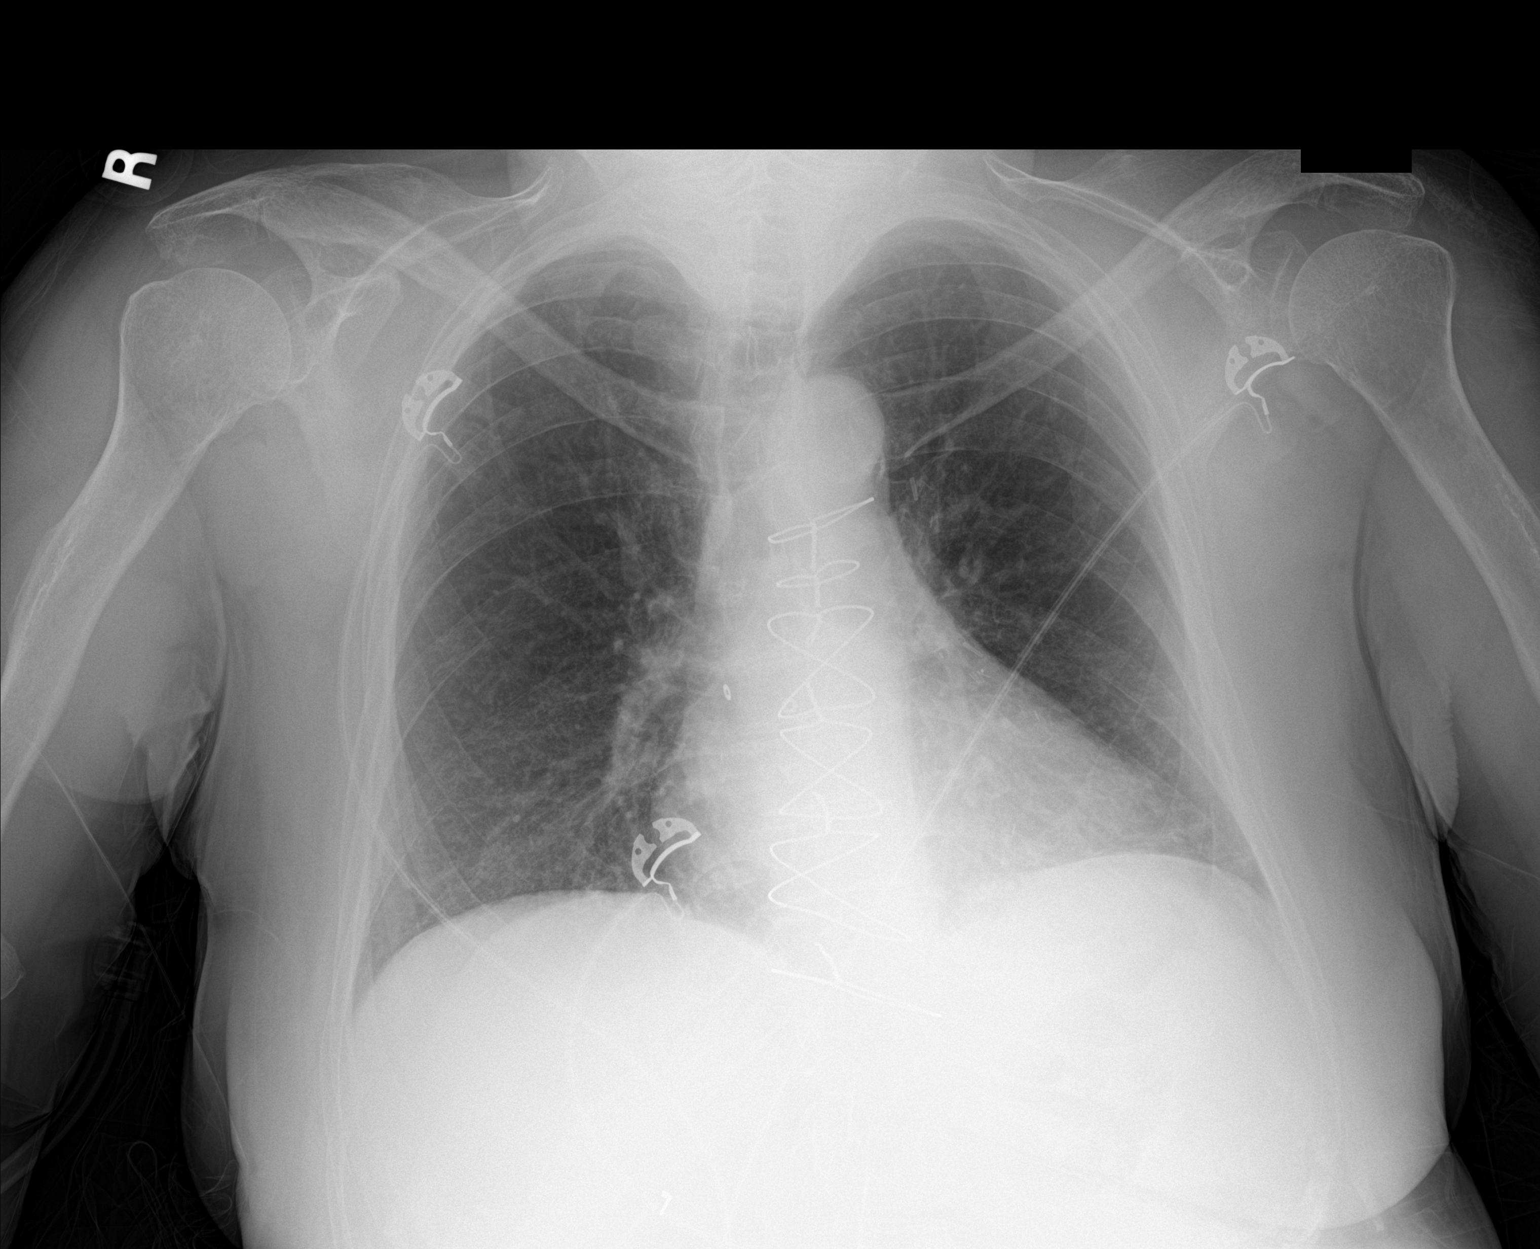

[1 of 1 positions shown; findings below may reference images not displayed]

FINDINGS: Stable cardiac silhouette. Post median sternotomy with wires in
alignment. Right upper quadrant cholecystectomy clips. Aortic
atherosclerosis with calcification. Clear lungs. No pleural effusion
or pneumothorax. Bones are unremarkable.
IMPRESSION: No active disease.

By: Koda Nissan M.D.

## 2018-04-29 DIAGNOSIS — R531 Weakness: Secondary | ICD-10-CM | POA: Diagnosis not present

## 2018-08-01 ENCOUNTER — Encounter

## 2018-08-01 ENCOUNTER — Encounter: Payer: Self-pay | Admitting: Diagnostic Neuroimaging

## 2018-08-01 ENCOUNTER — Ambulatory Visit (INDEPENDENT_AMBULATORY_CARE_PROVIDER_SITE_OTHER): Payer: Medicare HMO | Admitting: Diagnostic Neuroimaging

## 2018-08-01 VITALS — BP 211/96 | HR 83 | Ht 60.0 in | Wt 127.8 lb

## 2018-08-01 DIAGNOSIS — F1921 Other psychoactive substance dependence, in remission: Secondary | ICD-10-CM | POA: Insufficient documentation

## 2018-08-01 DIAGNOSIS — I672 Cerebral atherosclerosis: Secondary | ICD-10-CM | POA: Diagnosis not present

## 2018-08-01 DIAGNOSIS — I5022 Chronic systolic (congestive) heart failure: Secondary | ICD-10-CM | POA: Diagnosis not present

## 2018-08-01 DIAGNOSIS — F3341 Major depressive disorder, recurrent, in partial remission: Secondary | ICD-10-CM

## 2018-08-01 DIAGNOSIS — I509 Heart failure, unspecified: Secondary | ICD-10-CM | POA: Diagnosis not present

## 2018-08-01 DIAGNOSIS — F0391 Unspecified dementia with behavioral disturbance: Secondary | ICD-10-CM

## 2018-08-01 DIAGNOSIS — I700 Atherosclerosis of aorta: Secondary | ICD-10-CM

## 2018-08-01 DIAGNOSIS — F03B18 Unspecified dementia, moderate, with other behavioral disturbance: Secondary | ICD-10-CM

## 2018-08-01 NOTE — Progress Notes (Signed)
[facility]  PATIENT: Erica Stevens DOB: 1950-09-17  REFERRING CLINICIAN:  Luther Hearing, MD  HISTORY FROM: patient and daughter  REASON FOR VISIT: follow up   HISTORICAL  CHIEF COMPLAINT:  Chief Complaint  Patient presents with  . Follow-up    Rm 7,   . Memory Loss  . intracranial artherosclerosis    HISTORY OF PRESENT ILLNESS:   UPDATE (08/01/18, VRP): Since last visit, doing poorly. Symptoms are significantly progressed. Severity IS MODERATE. No alleviating or aggravating factors. Tolerating meds. Significant confusion with time, days and personal hygiene. More challenging for family. Sometimes staying with her own mother. Other times with daughter and son in law.   PRIOR HPI (10/02/17): 67 year old female here for evaluation of memory loss.  Patient reports short-term memory loss and confusion since December 2018.  According to daughter this problem is been going on for at least one year.  Patient was living alone until summer 2018 when she fell down, went to the hospital and was going to be referred to inpatient rehabilitation.  Patient did not want to go this route and therefore she moved in with her daughter.  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  Patient has been abstinent since at least August 2018.  Daughter noted more memory problems and confusion.  Since November 2018 patient has been living with her own mother who had some medical problems, and patient has not been living with daughter.  Patient forgets to take her medications.  She repeats herself.  She has trouble remembering appointments.  Today blood pressure significantly elevated at 217/101.  Patient continues to smoke cigarettes.  She has poorly controlled hypertension.  She has history of depression and anxiety.  She has history of chronic pain.  In course of workup patient was found to have significant brain atrophy as well as intracranial and extracranial stenosis.  Patient  referred here for further evaluation of vascular problems as well.   REVIEW OF SYSTEMS: Full 14 system review of systems performed and negative with exception of: Memory loss confusion headache slurred speech dizziness joint pain cramps aching muscles incontinence hearing loss spinning sensation.  ALLERGIES: Allergies  Allergen Reactions  . Erythromycin   . Ivp Dye [Iodinated Diagnostic Agents]   . Sulfa Antibiotics     Stated turns orange  . Sulfur     HOME MEDICATIONS: Outpatient Medications Prior to Visit  Medication Sig Dispense Refill  . amLODipine (NORVASC) 5 MG tablet Take 1 tablet (5 mg total) by mouth daily. (Patient not taking: Reported on 08/01/2018) 90 tablet 1  . aspirin 81 MG tablet Take 81 mg by mouth daily.    Marland Kitchen atorvastatin (LIPITOR) 80 MG tablet Take 1 tablet (80 mg total) by mouth daily. (Patient not taking: Reported on 08/01/2018) 90 tablet 1  . enalapril (VASOTEC) 10 MG tablet Take 1 tablet (10 mg total) by mouth 2 (two) times daily. (Patient not taking: Reported on 08/01/2018) 180 tablet 1  . metoprolol succinate (TOPROL-XL) 25 MG 24 hr tablet TAKE ONE (1) TABLET EACH DAY (Patient not taking: Reported on 08/01/2018) 90 tablet 1  . Naloxone HCl 0.4 MG/0.4ML SOAJ Follow package instructions for heroin or opioid overdose. (Patient not taking: Reported on 08/01/2018) 1 Package 1   No facility-administered medications prior to visit.     PAST MEDICAL HISTORY: Past Medical History:  Diagnosis Date  . Allergic rhinitis due to pollen   . Anxiety   . Brachial neuritis or radiculitis NOS   .  Cellulitis and abscess of unspecified site   . Chronic kidney disease, stage I   . Chronic pain syndrome   . Congestive heart failure, unspecified   . Coronary atherosclerosis of unspecified type of vessel, native or graft   . Degeneration of cervical intervertebral disc   . Depression   . Hypertension   . Migraine, unspecified, without mention of intractable migraine without  mention of status migrainosus   . 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 Osteoporosis, unspecified   . Pure hypercholesterolemia   . Reflux esophagitis   . Thoracic or lumbosacral neuritis or radiculitis, unspecified   . Unspecified urinary incontinence     PAST SURGICAL HISTORY: Past Surgical History:  Procedure Laterality Date  . ABDOMINAL HYSTERECTOMY    . BLADDER SURGERY    . BYPASS GRAFT    . CARDIAC CATHETERIZATION  2014   [facility]  . CHOLECYSTECTOMY    . COLONOSCOPY    . CORONARY ARTERY BYPASS GRAFT     x3 grafts;[facility]  . fasciotomy      FAMILY HISTORY: Family History  Problem Relation Age of Onset  . Hypertension Mother   . Hyperlipidemia Mother   . Heart disease Mother   . COPD Sister   . Depression Daughter   . Drug abuse Son     SOCIAL HISTORY:  Social History   Socioeconomic History  . Marital status: Divorced    Spouse name: Not on file  . Number of children: Not on file  . Years of education: Not on file  . Highest education level: Not on file  Occupational History  . Not on file  Social Needs  . Financial resource strain: Not on file  . Food insecurity:    Worry: Not on file    Inability: Not on file  . Transportation needs:    Medical: Not on file    Non-medical: Not on file  Tobacco Use  . Smoking status: Current Every Day Smoker    Packs/day: 1.50    Types: Cigarettes  . Smokeless tobacco: Never Used  Substance and Sexual Activity  . Alcohol use: No  . Drug use: Yes    Types: Cocaine, Heroin, Hydrocodone, Benzodiazepines    Comment: quit 04-2017  . Sexual activity: Not Currently  Lifestyle  . Physical activity:    Days per week: Not on file    Minutes per session: Not on file  . Stress: Not on file  Relationships  . Social connections:    Talks on phone: Not on file    Gets together: Not on file    Attends religious service: Not on file    Active member of club or organization: Not on file    Attends meetings of clubs or  organizations: Not on file    Relationship status: Not on file  . Intimate partner violence:    Fear of current or ex partner: Not on file    Emotionally abused: Not on file    Physically abused: Not on file    Forced sexual activity: Not on file  Other Topics Concern  . Not on file  Social History Narrative   She moved in with her daughter and grandchildren August 2018.  Now with her mother. 10-02-17.   Used to live by herself, divorced, has 2 children.  Education: HS.     Long history of depression, since delivery of her children         PHYSICAL EXAM  GENERAL EXAM/CONSTITUTIONAL: Vitals:  Vitals:  08/01/18 1416  BP: (!) 211/96  Pulse: 83  Weight: 127 lb 12.8 oz (58 kg)  Height: 5' (1.524 m)   Body mass index is 24.96 kg/m. No exam data present  Patient is in no distress; well developed, nourished and groomed; neck is supple  CARDIOVASCULAR:  Examination of carotid arteries is normal; no carotid bruits  Regular rate and rhythm, no murmurs  Examination of peripheral vascular system by observation and palpation is normal  EYES:  Ophthalmoscopic exam of optic discs and posterior segments is normal; no papilledema or hemorrhages  MUSCULOSKELETAL:  Gait, strength, tone, movements noted in Neurologic exam below  NEUROLOGIC: MENTAL STATUS:  MMSE - Mini Mental State Exam 08/01/2018 10/02/2017 06/19/2017  Orientation to time 4 3 5   Orientation to Place 4 3 5   Registration 3 3 3   Attention/ Calculation 1 1 5   Recall 2 3 3   Language- name 2 objects 2 2 2   Language- repeat 1 1 1   Language- follow 3 step command 2 3 3   Language- read & follow direction 1 1 1   Write a sentence 1 0 1  Copy design 0 1 1  Total score 21 21 30     awake, alert, oriented to person; NOT PLACE OR TIME  DECR memory  DECR attention and concentration  DECR FLUENCY  fund of knowledge appropriate  DECR INSIGHT  CRANIAL NERVE:   2nd - no papilledema on fundoscopic exam  2nd,  3rd, 4th, 6th - pupils equal and reactive to light, visual fields full to confrontation, extraocular muscles intact, no nystagmus  5th - facial sensation symmetric  7th - facial strength symmetric  8th - hearing intact  9th - palate elevates symmetrically, uvula midline  11th - shoulder shrug symmetric  12th - tongue protrusion midline  MOTOR:   normal bulk and tone, full strength in the BUE, BLE  SENSORY:   normal and symmetric to light touch  COORDINATION:   finger-nose-finger, fine finger movements normal  REFLEXES:   deep tendon reflexes present and symmetric; SLIGHTLY BRISK IN LEFT ARM  GAIT/STATION:   narrow based gait; USES WALKER    DIAGNOSTIC DATA (LABS, IMAGING, TESTING) - I reviewed patient records, labs, notes, testing and imaging myself where available.  Lab Results  Component Value Date   WBC 8.0 02/28/2017   HGB 14.2 02/28/2017   HCT 41.3 02/28/2017   MCV 96.2 02/28/2017   PLT 233 02/28/2017      Component Value Date/Time   NA 140 02/28/2017 2311   NA 140 10/01/2015 1238   NA 139 12/15/2012 0124   K 3.8 02/28/2017 2311   K 2.9 (L) 12/15/2012 0124   CL 103 02/28/2017 2311   CL 104 12/15/2012 0124   CO2 29 02/28/2017 2311   CO2 28 12/15/2012 0124   GLUCOSE 97 02/28/2017 2311   GLUCOSE 112 (H) 12/15/2012 0124   BUN 13 02/28/2017 2311   BUN 9 10/01/2015 1238   BUN 9 12/15/2012 0124   CREATININE 0.87 02/28/2017 2311   CREATININE 0.99 12/15/2012 0124   CALCIUM 9.2 02/28/2017 2311   CALCIUM 8.5 12/15/2012 0124   PROT 8.3 (H) 01/12/2017 2252   PROT 7.5 10/01/2015 1238   PROT 7.7 12/15/2012 0124   ALBUMIN 4.1 01/12/2017 2252   ALBUMIN 4.1 10/01/2015 1238   ALBUMIN 3.4 12/15/2012 0124   AST 47 (H) 01/12/2017 2252   AST 54 (H) 12/15/2012 0124   ALT 23 01/12/2017 2252   ALT 21 12/15/2012 0124   ALKPHOS  81 01/12/2017 2252   ALKPHOS 103 12/15/2012 0124   BILITOT 0.8 01/12/2017 2252   BILITOT 0.6 10/01/2015 1238   BILITOT 0.6 12/15/2012  0124   GFRNONAA >60 02/28/2017 2311   GFRNONAA >60 12/15/2012 0124   GFRAA >60 02/28/2017 2311   GFRAA >60 12/15/2012 0124   Lab Results  Component Value Date   CHOL 228 (H) 03/01/2017   HDL 48 03/01/2017   LDLCALC 156 (H) 03/01/2017   TRIG 122 03/01/2017   CHOLHDL 4.8 03/01/2017   Lab Results  Component Value Date   HGBA1C 5.0 03/01/2017   No results found for: [id number] Lab Results  Component Value Date   TSH 2.110 10/01/2015     03/01/17 MRI / MRA brain [I reviewed images myself and agree with interpretation. Moderate-severe mesial temporal atrophy. -VRP]  - Negative for acute infarct. - Atrophy and mild chronic microvascular ischemia - Occlusion of the right posterior cerebral artery and severe stenosis of the left posterior cerebral artery. Multiple areas of stenosis in left middle cerebral artery branches.  03/01/17 carotid u/s - Less than 50% stenosis in the right internal carotid artery. - 50-69% stenosis in the left internal carotid artery.     ASSESSMENT AND PLAN  67 y.o. year old female here with gradual onset progressive short-term memory loss, confusion, repeating herself, forgetting appointments, difficulty managing her own ADLs.  Also with significant tobacco abuse, prior opiate and cocaine abuse, uncontrolled hypertension.  Dx:  1. Moderate dementia with behavioral disturbance (HCC)   2. Intracranial atherosclerosis     PLAN:  I spent 20 minutes of face to face time with patient. Greater than 50% of time was spent in counseling and coordination of care with patient. This is necessary because patient's symptoms are not optimally controlled / improved. In summary we discussed: - Diagnostic results, impressions, or recommended diagnostic studies: MODERATE DEMENTIA - Prognosis: guarded - Risks and benefits of management (treatment) options: supportive care; not candidate for meds (patient declining all meds)  MEMORY LOSS (moderate dementia;  depression/anxiety and pain may also be factors) - safety / supervision issues reviewed - caregiver resources provided - caution with driving and finances - treat vascular risk factors (smoking, BP, lipids) - treat underlying mood disorders (depression, anxiety)  INTRACRANIAL ATHEROSCLEROSIS (asymptomatic) - continue aspirin 81mg  daily - continue BP control - continue statin  LEFT ICA STENOSIS (50-69%; asymptomatic) - follow up with vascular surgery (ordered by Dr. Lady Gary) for monitoring; then consider repeat carotid u/s every 6-12 months; consider CTA neck to better define anatomy and degree of stenosis - aggressive risk factor management as above  Return if symptoms worsen or fail to improve, for return to PCP.    Suanne Marker, MD 08/01/2018, 2:45 PM Certified in Neurology, Neurophysiology and Neuroimaging  [facility] [address] [place], [place] [address] [phone]

## 2018-09-24 ENCOUNTER — Encounter: Payer: Self-pay | Admitting: Family Medicine

## 2018-09-24 ENCOUNTER — Ambulatory Visit (INDEPENDENT_AMBULATORY_CARE_PROVIDER_SITE_OTHER): Payer: Medicare HMO | Admitting: Family Medicine

## 2018-09-24 ENCOUNTER — Ambulatory Visit: Payer: Self-pay | Admitting: Pharmacist

## 2018-09-24 VITALS — BP 168/96 | HR 73 | Temp 98.1°F | Resp 14 | Ht 60.0 in | Wt 119.0 lb

## 2018-09-24 DIAGNOSIS — I10 Essential (primary) hypertension: Secondary | ICD-10-CM | POA: Diagnosis not present

## 2018-09-24 DIAGNOSIS — I5022 Chronic systolic (congestive) heart failure: Secondary | ICD-10-CM

## 2018-09-24 DIAGNOSIS — F0391 Unspecified dementia with behavioral disturbance: Secondary | ICD-10-CM

## 2018-09-24 DIAGNOSIS — F03B18 Unspecified dementia, moderate, with other behavioral disturbance: Secondary | ICD-10-CM

## 2018-09-24 DIAGNOSIS — E785 Hyperlipidemia, unspecified: Secondary | ICD-10-CM | POA: Diagnosis not present

## 2018-09-24 DIAGNOSIS — I700 Atherosclerosis of aorta: Secondary | ICD-10-CM | POA: Diagnosis not present

## 2018-09-24 DIAGNOSIS — R413 Other amnesia: Secondary | ICD-10-CM

## 2018-09-24 MED ORDER — ASPIRIN 81 MG PO TABS: 81.0000 mg | ORAL_TABLET | Freq: Every day | ORAL | 1 refills | Status: AC

## 2018-09-24 MED ORDER — METOPROLOL SUCCINATE ER 25 MG PO TB24: ORAL_TABLET | ORAL | 1 refills | Status: AC

## 2018-09-24 MED ORDER — AMLODIPINE BESYLATE 5 MG PO TABS: 5.0000 mg | ORAL_TABLET | Freq: Every day | ORAL | 1 refills | Status: DC

## 2018-09-24 MED ORDER — ATORVASTATIN CALCIUM 80 MG PO TABS: 80.0000 mg | ORAL_TABLET | Freq: Every day | ORAL | 1 refills | Status: AC

## 2018-09-24 MED ORDER — ENALAPRIL MALEATE 10 MG PO TABS: 10.0000 mg | ORAL_TABLET | Freq: Two times a day (BID) | ORAL | 1 refills | Status: DC

## 2018-09-24 MED ORDER — DONEPEZIL HCL 5 MG PO TABS: 5.0000 mg | ORAL_TABLET | Freq: Every day | ORAL | 0 refills | Status: DC

## 2018-09-24 NOTE — Progress Notes (Signed)
Name: Erica LaundrySusan Gray Stevens   MRN: [id number]    DOB: [birth year]   Date:09/24/2018       Progress Note  Subjective  Chief Complaint  Chief Complaint  Patient presents with  . Memory Loss    diagnosed with Dementia January 2019  . Hypertension    not taking medication    HPI  History is provided by daughter with minimal input by the patient.  Pt's Ex-husband is also present int he exam room.  Dementia: She saw Dr. Marjory LiesPenumalli with Neurology in November 2019 - daughter states the appointment went poorly & they were told to return to PCP for medication management of her dementia.  She requests new referral for second opinion.  Has family history of Alzheimer's. Symptoms started at least 3 years ago, but in the last year things have gotten significantly worse. Patient is currently staying with her legally blind mother who is 87yo due to her daughter/son in law unable to care for her currently.  She is not able to perform her ADL's alone, incontinent of stool and urine, daughter is managing her finances, she is not driving (has not in over 5 years), eats very little - picks at foods that are made avaible, emotional outbursts/irritability is much worse (yelling at family members, swatting at daughter when frustrated); thinks everyday is Sunday - short term memory is very poor, no longer able to use cell phone; long-term memory seems to be intact. She does have moments of clarity very seldom.   HTN: Not taking medications - she forgets to take, and she had no refills on prescriptions - has been out for months.  She has elevated reading today. No sudden changes in her behavior - slow progression secondary to dementia as above.  Denies BLE edema, chest pain, shortness of breat, vision changes; does have headaches  HLD: She has not been taking her atorvastatin. She did see Dr. Lady GaryFath in October and he recommended continuation of atorvastatin at higih dose along with continuation of aspirin 81mg  daily.   Denies myalgias, chest pain, or shortness of breath.  Tobacco Abuse: Down to 1/2ppd.  Not ready to quit completely.  Patient Active Problem List   Diagnosis Date Noted  . History of drug dependence/abuse (HCC) 08/01/2018  . Chronic systolic congestive heart failure (HCC) 08/01/2018  . Atherosclerosis of aorta (HCC) 05/22/2017  . Fracture of thoracic spine (HCC) 05/22/2017  . Slurred speech 03/01/2017  . Opiate overdose (HCC) 01/13/2017  . Cocaine abuse (HCC) 01/13/2017  . Benzodiazepine abuse (HCC) 01/13/2017  . Heroin abuse (HCC) 05/03/2016  . Anoxia 05/03/2016  . Moderate dementia with behavioral disturbance (HCC) 05/03/2016  . Accidental heroin overdose (HCC) 05/03/2016  . Severe recurrent major depression (HCC) 05/01/2016  . Angina pectoris (HCC) 02/24/2016  . Cervical disc disease 10/02/2015  . Affective disorder, major 07/02/2015  . Benign hypertensive heart disease 03/09/2015  . Migraine 03/09/2015  . Billowing mitral valve 03/09/2015  . Osteoporosis 03/09/2015  . Urge incontinence 03/09/2015  . Chest pressure 06/21/2013  . Leg edema 06/21/2013  . Shortness of breath 06/21/2013  . CAD (coronary artery disease) of artery bypass graft 06/21/2013  . Hyperlipidemia 06/21/2013  . Chronic pain 03/25/2009  . Hypercholesterolemia without hypertriglyceridemia 05/22/2007  . Coronary atherosclerosis 05/22/2007  . 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 Brachial neuritis 12/29/2006    Social History   Tobacco Use  . Smoking status: Current Every Day Smoker    Packs/day: 1.50    Types: Cigarettes  . Smokeless  tobacco: Never Used  Substance Use Topics  . Alcohol use: No     Current Outpatient Medications:  .  amLODipine (NORVASC) 5 MG tablet, Take 1 tablet (5 mg total) by mouth daily., Disp: 90 tablet, Rfl: 1 .  aspirin 81 MG tablet, Take 1 tablet (81 mg total) by mouth daily., Disp: 90 tablet, Rfl: 1 .  atorvastatin (LIPITOR) 80 MG tablet, Take 1 tablet (80 mg total) by  mouth daily., Disp: 90 tablet, Rfl: 1 .  enalapril (VASOTEC) 10 MG tablet, Take 1 tablet (10 mg total) by mouth 2 (two) times daily., Disp: 180 tablet, Rfl: 1 .  metoprolol succinate (TOPROL-XL) 25 MG 24 hr tablet, TAKE ONE (1) TABLET EACH DAY, Disp: 90 tablet, Rfl: 1 .  Naloxone HCl 0.4 MG/0.4ML SOAJ, Follow package instructions for heroin or opioid overdose., Disp: 1 Package, Rfl: 1 .  donepezil (ARICEPT) 5 MG tablet, Take 1 tablet (5 mg total) by mouth at bedtime., Disp: 45 tablet, Rfl: 0  Allergies  Allergen Reactions  . Erythromycin   . Ivp Dye [Iodinated Diagnostic Agents]   . Sulfa Antibiotics     Stated turns orange  . Sulfur     I personally reviewed active problem list, medication list, allergies, notes from last encounter, lab results with the patient/caregiver today.  ROS  Constitutional: Negative for fever or weight change.  Respiratory: Negative for cough and shortness of breath.   Cardiovascular: Negative for chest pain or palpitations.  Gastrointestinal: Negative for abdominal pain, no bowel changes.  Musculoskeletal: Negative for gait problem or joint swelling.  Skin: Negative for rash.  Neurological: Negative for dizziness or headache.  See HPI No other specific complaints in a complete review of systems (except as listed in HPI above).  Objective  Vitals:   09/24/18 0916  BP: (!) 168/96  Pulse: 73  Resp: 14  Temp: 98.1 F (36.7 C)  TempSrc: Oral  SpO2: 99%  Weight: 119 lb (54 kg)  Height: 5' (1.524 m)   Body mass index is 23.24 kg/m.  Nursing Note and Vital Signs reviewed.  Physical Exam  Constitutional: Patient appears well-developed and well-nourished. No distress.  HENT: Head: Normocephalic and atraumatic.  Eyes: Conjunctivae and EOM are normal. No scleral icterus.  Neck: Normal range of motion. Neck supple. No JVD present. No thyromegaly present.  Cardiovascular: Normal rate, regular rhythm and normal heart sounds.  No murmur heard. No BLE  edema. Pulmonary/Chest: Effort normal and breath sounds normal. No respiratory distress. Abdominal: Soft. Bowel sounds are normal, no distension. There is no tenderness. No masses. Musculoskeletal: Normal range of motion, no joint effusions. No gross deformities Neurological: Pt is alert and oriented to person, place, but not to time. No cranial nerve deficit. Coordination, balance, strength, speech and gait are baseline per family - she is in wheelchair in office today.  Skin: Skin is warm and dry. No rash noted. No erythema.  Psychiatric: Patient has a normal mood and calm affect. behavior is appropriate. Judgment is poor.  No results found for this or any previous visit (from the past 72 hour(s)).  Assessment & Plan 1. Moderate dementia with behavioral disturbance (HCC) - Very lengthy discussion regarding caregiver burnout - referral to CCM to consider respite care. - Ambulatory referral to Neurology - donepezil (ARICEPT) 5 MG tablet; Take 1 tablet (5 mg total) by mouth at bedtime.  Dispense: 45 tablet; Refill: 0 - We will increase at next visit if SE's are not occurring on low dose. - Ambulatory  referral to Chronic Care Management Services  2. Uncontrolled hypertension - enalapril (VASOTEC) 10 MG tablet; Take 1 tablet (10 mg total) by mouth 2 (two) times daily.  Dispense: 180 tablet; Refill: 1 - metoprolol succinate (TOPROL-XL) 25 MG 24 hr tablet; TAKE ONE (1) TABLET EACH DAY  Dispense: 90 tablet; Refill: 1 - amLODipine (NORVASC) 5 MG tablet; Take 1 tablet (5 mg total) by mouth daily.  Dispense: 90 tablet; Refill: 1 - aspirin 81 MG tablet; Take 1 tablet (81 mg total) by mouth daily.  Dispense: 90 tablet; Refill: 1  3. Dyslipidemia - atorvastatin (LIPITOR) 80 MG tablet; Take 1 tablet (80 mg total) by mouth daily.  Dispense: 90 tablet; Refill: 1  4. Atherosclerosis of aorta (HCC) - atorvastatin (LIPITOR) 80 MG tablet; Take 1 tablet (80 mg total) by mouth daily.  Dispense: 90 tablet;  Refill: 1 - aspirin 81 MG tablet; Take 1 tablet (81 mg total) by mouth daily.  Dispense: 90 tablet; Refill: 1  5. Chronic systolic congestive heart failure (HCC) - enalapril (VASOTEC) 10 MG tablet; Take 1 tablet (10 mg total) by mouth 2 (two) times daily.  Dispense: 180 tablet; Refill: 1 - metoprolol succinate (TOPROL-XL) 25 MG 24 hr tablet; TAKE ONE (1) TABLET EACH DAY  Dispense: 90 tablet; Refill: 1   -Red flags and when to present for emergency care or RTC including fever >101.41F, chest pain, shortness of breath, new/worsening/un-resolving symptoms, reviewed with patient at time of visit. Follow up and care instructions discussed and provided in AVS.

## 2018-09-24 NOTE — Chronic Care Management (AMB) (Signed)
  Chronic Care Management   Note  09/24/2018 Name: Aleysia Oltmann MRN: [id number] DOB: 1951-09-01  Sangeeta Youse is a 68 y.o. year old female in the office today for a follow up visit with [name], FNP for dementia follow up appointment.   [name], FNP asked the CCM team to consult today for assistance with chronic disease management related to caregiver strain (Ms. Breitenstein daughter) and nursing care options. Ms.. Mcgough agreed that CCM services would be helpful to them.    Plan: Ms. Mulhall agreed to [facility] services and verbal consent obtained.. Initial CCM office visit scheduled for January 28th at [place] was given information about Chronic Care Management services today including:  1. CCM service includes personalized support from designated clinical staff supervised by her physician, including individualized plan of care and coordination with other care providers 2. 24/7 contact phone numbers for assistance for urgent and routine care needs. 3. Service will only be billed when office clinical staff spend 20 minutes or more in a month to coordinate care. 4. Only one practitioner may furnish and bill the service in a calendar month. 5. The patient may stop CCM services at any time (effective at the end of the month) by phone call to the office staff. 6. The patient will be responsible for cost sharing (co-pay) of up to 20% of the service fee (after annual deductible is met).     Ruben Reason, PharmD Clinical Pharmacist [facility] [phone]

## 2018-09-24 NOTE — Patient Instructions (Signed)
Scheduled CCM appointment with nurse care manager and clinical pharmacist for October 09, 2018  Please call a member of the CCM (Chronic Care Management) Team with any questions or case management needs:   Vanetta Mulders, BSN Nurse Care Coordinator  [phone]  Ruben Reason, PharmD  Clinical Pharmacist  [phone]   Please bring ALL OF YOUR MEDICATIONS to your appointment. Thank you!  Erica Stevens was given information about Chronic Care Management services today including:  1. CCM service includes personalized support from designated clinical staff supervised by her physician, including individualized plan of care and coordination with other care providers 2. 24/7 contact phone numbers for assistance for urgent and routine care needs. 3. Service will only be billed when office clinical staff spend 20 minutes or more in a month to coordinate care. 4. Only one practitioner may furnish and bill the service in a calendar month. 5. The patient may stop CCM services at any time (effective at the end of the month) by phone call to the office staff. 6. The patient will be responsible for cost sharing (co-pay) of up to 20% of the service fee (after annual deductible is met).  Patient agreed to services and verbal consent obtained.

## 2018-10-09 ENCOUNTER — Ambulatory Visit (INDEPENDENT_AMBULATORY_CARE_PROVIDER_SITE_OTHER): Payer: Medicare HMO | Admitting: Pharmacist

## 2018-10-09 DIAGNOSIS — F0391 Unspecified dementia with behavioral disturbance: Secondary | ICD-10-CM

## 2018-10-09 DIAGNOSIS — I10 Essential (primary) hypertension: Secondary | ICD-10-CM

## 2018-10-09 DIAGNOSIS — F03B18 Unspecified dementia, moderate, with other behavioral disturbance: Secondary | ICD-10-CM

## 2018-10-09 DIAGNOSIS — I5022 Chronic systolic (congestive) heart failure: Secondary | ICD-10-CM

## 2018-10-09 NOTE — Chronic Care Management (AMB) (Signed)
Chronic Care Management   Note  10/09/2018 Name: Erica Stevens MRN: [id number] DOB: 16-Nov-1950  Chief Complaint  Patient presents with  . Chronic Care Management    Initial office visit, presents with daughter Erica Stevens    Subjective:   Does the patient  feel that his/her medications are working for him/her?  Has not been remembering to take medications despite daughter's reminders  Has the patient been experiencing any side effects to the medications prescribed?  yes - diarrhea with donepezil  Does the patient measure his/her own blood glucose at home?  no   Does the patient measure his/her own blood pressure at home? no   Does the patient have any problems obtaining medications due to transportation or finances?   no  Understanding of regimen: poor Understanding of indications: poor Potential of compliance: poor  Objective: Lab Results  Component Value Date   CREATININE 0.87 02/28/2017   CREATININE 0.64 01/14/2017   CREATININE 0.79 01/12/2017    Lab Results  Component Value Date   HGBA1C 5.0 03/01/2017    Lipid Panel     Component Value Date/Time   CHOL 228 (H) 03/01/2017 0436   CHOL 200 (H) 10/01/2015 1238   TRIG 122 03/01/2017 0436   HDL 48 03/01/2017 0436   HDL 62 10/01/2015 1238   CHOLHDL 4.8 03/01/2017 0436   VLDL 24 03/01/2017 0436   LDLCALC 156 (H) 03/01/2017 0436   LDLCALC 119 (H) 10/01/2015 1238    BP Readings from Last 3 Encounters:  09/24/18 (!) 168/96  08/01/18 (!) 211/96  10/02/17 (!) 217/101    Allergies  Allergen Reactions  . Erythromycin   . Ivp Dye [Iodinated Diagnostic Agents]   . Sulfa Antibiotics     Stated turns orange  . Sulfur     Medications Reviewed Today    Reviewed by Andee Poles, [facility] (Pharmacist) on 10/09/18 at 0950  Med List Status: <None>  Medication Order Taking? Sig Documenting Provider Last Dose Status Informant  amLODipine (NORVASC) 5 MG tablet [id number] No Take 1 tablet (5 mg total) by mouth daily.    Patient not taking:  Reported on 10/09/2018   Doren Custard, FNP Not Taking Active   aspirin 81 MG tablet [id number] No Take 1 tablet (81 mg total) by mouth daily.  Patient not taking:  Reported on 10/09/2018   Doren Custard, FNP Not Taking Active   atorvastatin (LIPITOR) 80 MG tablet [id number] No Take 1 tablet (80 mg total) by mouth daily.  Patient not taking:  Reported on 10/09/2018   Doren Custard, FNP Not Taking Active   donepezil (ARICEPT) 5 MG tablet [id number] No Take 1 tablet (5 mg total) by mouth at bedtime.  Patient not taking:  Reported on 10/09/2018   Doren Custard, FNP Not Taking Active   enalapril (VASOTEC) 10 MG tablet [id number] No Take 1 tablet (10 mg total) by mouth 2 (two) times daily.  Patient not taking:  Reported on 10/09/2018   Doren Custard, FNP Not Taking Active   metoprolol succinate (TOPROL-XL) 25 MG 24 hr tablet [id number] No TAKE ONE (1) TABLET EACH DAY  Patient not taking:  Reported on 10/09/2018   Doren Custard, FNP Not Taking Active   Naloxone HCl 0.4 MG/0.4ML Ivory Broad [id number] No Follow package instructions for heroin or opioid overdose.  Patient not taking:  Reported on 10/09/2018   Sharman Cheek, MD Not Taking Active Pharmacy Records  Assessment:   Total Number of meds:  5 (polypharmacy > 10 meds)  Indications for all medications: [x]  Yes       []  No  Adherence Review  []  Excellent (no doses missed/week)     []  Good (no more than 1 dose missed/week)     []  Partial (2-3 doses missed/week)     [x]  Poor (>3 doses missed/week)  Intervention  YES NO  Explanation   Adherence      Cannot afford medication []  []     Cannot self-administer medication appropriately []  []     Does not understand directions []  []     Prefers not to take []  []     Product unavailable []  []     Forgets to take [x]  []  Per daughter Erica SonCarrie, patient takes most daily medications at night but has forgotten the daily medications almost every day in the past 2 weeks;  recommend switching daily medications to AM dosing; CCM pharmacist will collaborate with prescriber to convert enalapril BID to once daily   Dose/Frequency      Dose form inappropriate for patient []  []     Dosing consideration [x]  []  Twice daily dosing to once daily; convert once daily medications from evening to AM dosing   Other pertinent pharmacist  counseling   Possible cost savings using [employer] mail order, OTC catalog for ASA and adult diapers   Goals Addressed            This Visit's Progress   . "I don't have a living will" (pt-stated)       Current Barriers:  Marland Kitchen. Memory loss .  Marland Kitchen.   Nurse Case Manager Clinical Goal(s): Over the next 14 days, [name] and daughter Erica SonCarrie will complete advanced directive paperwork and get it notarized (for example, at your bank) and return to [employer] and safety inspector(nurse care manager) at [employer]  Interventions:  . Provided advanced directive paperwork . Counseled on the importance of end of life decision making   Patient Self Care Activities:  . Complete paperwork   *initial goal documentation       . "I want to get my own appartment" (pt-stated)       Current Barriers:  . Dementia . finance . Knowledge deficit related to resources available for dementia care/facility placement  Nurse Case Manager Clinical Goal(s): Over the next 14 days, patient/granddaughter will report utilizing plan or care discussed today to establish permanent residence for patient/ or assistance at home.  Interventions:  . Collaboration with [facility] LCSW . Request for FL2 from PCP . Provided patient/daughter with process to acquire "special assistance medicare" . Provided patient/daughter with alternative demential programs in [place] . Discussed with patient reasons for alternative care   Patient Self Care Activities:  . Patient/granddaughter will utilize resources provided today   *initial goal documentation     . "She forgets to take her medications"  (pt-stated)       Current Barriers:  . Dementia . Requires additional care  Pharmacist Clinical Goal(s): Over the next 90 days, [name] will be adherent to all medications as evidenced by a percentage of days covered [facility](PDC) report of >80%.   Interventions: . Convert all medications to morning . CCM pharmacist Raynelle Fanning(Maryalice Pasley) will collaborate with Irving Burtonmily to eliminate twice daily enalapril and replace it with a once daily regimen  Patient Self Care Activities:  . Take all medications as prescribed  *initial goal documentation         Plan: Recommendations discussed with provider - Change once  daily medications to morning for better adherence - Simplify regimen to all once daily- enalapril is currently BID; difficult to evaluate efficacy as patient has been non adherent to medications; last 3 BP readings in office have been significantly elevated   Karalee Height, PharmD Clinical Pharmacist [facility] [phone]

## 2018-10-09 NOTE — Addendum Note (Signed)
Addended by: Yvone Neu E on: 10/09/2018 02:35 PM   Modules accepted: Orders

## 2018-10-09 NOTE — Patient Instructions (Addendum)
1. Thank you for allowing the CCM Team to assist you with your healthcare needs and goals. It was a pleasure meeting you and your daughter today. 2. Please consider utilizing [facility] for Caregiver Support and possible respet care until you can go to an assisted living. 3. Once FL2 is completed please take it so the Medicaid office and ask to transfer her Medicaid to "Special Assistance Medicaid" They will then give you a list of facilities. Please consider [place] if they have an opening. 4. You may want to look at [facility], [place] [facility], or [facility]   [facility] [phone]  [facility] [phone]  5. Contact [employer] [phone]) to double check on over the counter (OTC) benefit- you can use this for aspirin, adult diapers, and other personal care items 6. Complete Advanced Directive or "living will" paper work with Erica Stevens and get notarized at the bank. Return papers to [facility] so it can be scanned in to [facility] computer system 7. Take all medication in the morning instead of in the evening. Erica Stevens (pharmacist) will work with Erica Ensign, NP to convert the enalapril to once daily dosing.  8. Utilize word find puzzles daily for memory stimulation. Read as often as possible. 9. Please have your family read "the 36 hour day"  Goals Addressed            This Visit's Progress   . "I don't have a living will" (pt-stated)       Current Barriers:  Marland Kitchen Memory loss   Nurse Case Manager Clinical Goal(s): Over the next 14 days, Ms. Erica Stevens and daughter Erica Stevens will complete advanced directive paperwork and get it notarized (for example, at your bank) and return to [place] Programme researcher, broadcasting/film/video) at [employer]  Interventions:  . Provided advanced directive paperwork . Counseled on the importance of end of life decision making   Patient Self Care Activities:  . Complete  paperwork   *initial goal documentation    . "I want to get my own appartment" (pt-stated)       Current Barriers:  . Dementia . finance . Knowledge deficit related to resources available for dementia care/facility placement  Nurse Case Manager Clinical Goal(s): Over the next 14 days, patient/daugher Erica Stevens will report utilizing plan or care discussed today to establish permanent residence for patient/ or assistance at home.  Interventions:  . Collaboration with [facility] . Request for FL2 from PCP . Provided patient/daughter with process to acquire "special assistance medicare" . Provided patient/daughter with alternative demential programs in [place] . Discussed with patient reasons for alternative care   Patient Self Care Activities:  . Patient/granddaughter will utilize resources provided today   *initial goal documentation    . "She forgets to take her medications" (pt-stated)       Current Barriers:  . Dementia . Requires additional care  Pharmacist Clinical Goal(s): Over the next 90 days, Ms. Erica Stevens will be adherent to all medications as evidenced by a percentage of days covered [facility]) report of >80%.   Interventions: . Convert all medications to morning . CCM pharmacist Erica Stevens) will collaborate with Erica Stevens to eliminate twice daily enalapril and replace it with a once daily regimen  Patient Self Care Activities:  . Take all medications as prescribed  *initial goal documentation     Dementia Caregiver Guide Dementia is a term used to describe a number of symptoms that affect memory and thinking. The most  common symptoms include:  Memory loss.  Trouble with language and communication.  Trouble concentrating.  Poor judgment.  Problems with reasoning.  Child-like behavior and language.  Extreme anxiety.  Angry outbursts.  Wandering from home or public places. Dementia usually gets worse slowly over time. In the early stages, people with dementia  can stay independent and safe with some help. In later stages, they need help with daily tasks such as dressing, grooming, and using the bathroom. How to help the person with dementia cope Dementia can be frightening and confusing. Here are some tips to help the person with dementia cope with changes caused by the disease. General tips  Keep the person on track with his or her routine.  Try to identify areas where the person may need help.  Be supportive, patient, calm, and encouraging.  Gently remind the person that adjusting to changes takes time.  Help with the tasks that the person has asked for help with.  Keep the person involved in daily tasks and decisions as much as possible.  Encourage conversation, but try not to get frustrated or harried if the person struggles to find words or does not seem to appreciate your help. Communication tips  When the person is talking or seems frustrated, make eye contact and hold the person's hand.  Ask specific questions that need yes or no answers.  Use simple words, short sentences, and a calm voice. Only give one direction at a time.  When offering choices, limit them to just 1 or 2.  Avoid correcting the person in a negative way.  If the person is struggling to find the right words, gently try to help him or her. How to recognize symptoms of stress Symptoms of stress in caregivers include:  Feeling frustrated or angry with the person with dementia.  Denying that the person has dementia or that his or her symptoms will not improve.  Feeling hopeless and unappreciated.  Difficulty sleeping.  Difficulty concentrating.  Feeling anxious, irritable, or depressed.  Developing stress-related health problems.  Feeling like you have too little time for your own life. Follow these instructions at home:   Make sure that you and the person you are caring for: ? Get regular sleep. ? Exercise regularly. ? Eat regular, nutritious  meals. ? Drink enough fluid to keep your urine clear or pale yellow. ? Take over-the-counter and prescription medicines only as told by your health care providers. ? Attend all scheduled health care appointments.  Join a support group with others who are caregivers.  Ask about respite care resources so that you can have a regular break from the stress of caregiving.  Look for signs of stress in yourself and in the person you are caring for. If you notice signs of stress, take steps to manage it.  Consider any safety risks and take steps to avoid them.  Organize medications in a pill box for each day of the week.  Create a plan to handle any legal or financial matters. Get legal or financial advice if needed.  Keep a calendar in a central location to remind the person of appointments or other activities. Tips for reducing the risk of injury  Keep floors clear of clutter. Remove rugs, magazine racks, and floor lamps.  Keep hallways well lit, especially at night.  Put a handrail and nonslip mat in the bathtub or shower.  Put childproof locks on cabinets that contain dangerous items, such as medicines, alcohol, guns, toxic cleaning items, sharp  tools or utensils, matches, and lighters.  Put the locks in places where the person cannot see or reach them easily. This will help ensure that the person does not wander out of the house and get lost.  Be prepared for emergencies. Keep a list of emergency phone numbers and addresses in a convenient area.  Remove car keys and lock garage doors so that the person does not try to get in the car and drive.  Have the person wear a bracelet that tracks locations and identifies the person as having memory problems. This should be worn at all times for safety. Where to find support: Many individuals and organizations offer support. These include:  Support groups for people with dementia and for caregivers.  Counselors or therapists.  Home health  care services.  Adult day care centers. Where to find more information Alzheimer's Association: [employer] Contact a health care provider if:  The person's health is rapidly getting worse.  You are no longer able to care for the person.  Caring for the person is affecting your physical and emotional health.  The person threatens himself or herself, you, or anyone else. Summary  Dementia is a term used to describe a number of symptoms that affect memory and thinking.  Dementia usually gets worse slowly over time.  Take steps to reduce the person's risk of injury, and to plan for future care.  Caregivers need support, relief from caregiving, and time for their own lives. This information is not intended to replace advice given to you by your health care provider. Make sure you discuss any questions you have with your health care provider. Document Released: 08/02/2016 Document Revised: 08/02/2016 Document Reviewed: 08/02/2016 Elsevier Interactive Patient Education  2019 Elsevier  Erica Stevens was given information about Chronic Care Management services today including:  1. CCM service includes personalized support from designated clinical staff supervised by her physician, including individualized plan of care and coordination with other care providers 2. 24/7 contact phone numbers for assistance for urgent and routine care needs. 3. Service will only be billed when office clinical staff spend 20 minutes or more in a month to coordinate care. 4. Only one practitioner may furnish and bill the service in a calendar month. 5. The patient may stop CCM services at any time (effective at the end of the month) by phone call to the office staff. 6. The patient will be responsible for cost sharing (co-pay) of up to 20% of the service fee (after annual deductible is met).

## 2018-10-09 NOTE — Chronic Care Management (AMB) (Addendum)
Chronic Care Management   Initial Visit Note  10/09/2018 Name: Erica Stevens MRN: [id number] DOB: February 21, 1951  Referred by: Steele Sizer, MD Reason for referral : Chronic Care Management (Initial office visit)   Subjective: "Mom is not taking her medications and needs assistance with bathing, cooking, and laundry" "We would like to know our options for group home or assisted living"  Objective:  BP Readings from Last 3 Encounters:  09/24/18 (!) 168/96  08/01/18 (!) 211/96  10/02/17 (!) 217/101   Assessment:  Erica Ensign, FNP asked the CCM team to consult 09/24/2018 for assistance with chronic disease management related to caregiver strain (Erica Stevens) and nursing care options. Erica Stevens agreed that CCM services would be helpful to them. Today the CCM Team met with Erica Stevens and Stevens Erica Stevens face to face to discuss medication compliance and caregiver/facility options.  Goals Addressed            This Visit's Progress   . "I don't have a living will" (pt-stated)       Current Barriers:  Marland Kitchen Memory loss .  Marland Kitchen   Nurse Case Manager Clinical Goal(s): Over the next 14 days, Erica Stevens and Stevens Erica Stevens will complete advanced directive paperwork and get it notarized (for example, at your bank) and return to Erica Stevens, Erica Stevens) at [employer]  Interventions:  . Provided advanced directive paperwork . Counseled on the importance of end of life decision making   Patient Self Care Activities:  . Complete paperwork   *initial goal documentation    . "I want to get my own appartment" (pt-stated)       Current Barriers:  . Dementia . finance . Knowledge deficit related to resources available for dementia care/facility placement  Nurse Case Manager Clinical Goal(s): Over the next 14 days, patient/Stevens will report utilizing plan or care discussed today to establish permanent residence for patient/ or assistance at home. Will also  report engaging with [facility] LCSW  Interventions:  . Collaboration with [place] [facility], will refer . Provided patient/Stevens with process to acquire "special assistance medicare" . Provided patient/Stevens with alternative demential programs in [place] . Discussed with patient reasons for alternative care   Patient Self Care Activities:  . Patient/granddaughter will utilize resources provided today   *initial goal documentation     . "She forgets to take her medications" (pt-stated)       Current Barriers:  . Dementia . Requires additional care  Pharmacist Clinical Goal(s): Over the next 90 days, Erica Stevens will be adherent to all medications as evidenced by a percentage of days covered [facility]) report of >80%.   Interventions: . Convert all medications to morning . CCM pharmacist Erica Stevens) will collaborate with Erica Stevens to eliminate twice daily enalapril and replace it with a once daily regimen  Patient Self Care Activities:  . Take all medications as prescribed  *initial goal documentation    Plan: CCM will follow up with Stevens Erica Stevens in two weeks.   Erica Stevens was given information about Chronic Care Management services today including:  1. CCM service includes personalized support from designated clinical staff supervised by her physician, including individualized plan of care and coordination with other care providers 2. 24/7 contact phone numbers for assistance for urgent and routine care needs. 3. Service will only be billed when office clinical staff spend 20 minutes or more in a month to coordinate care. 4. Only one practitioner may furnish and bill the service in a  calendar month. 5. The patient may stop CCM services at any time (effective at the end of the month) by phone call to the office staff. 6. The patient will be responsible for cost sharing (co-pay) of up to 20% of the service fee (after annual deductible is met).  Patient agreed to services and  verbal consent obtained.    Shayanna Thatch E. Rollene Rotunda, RN, BSN Nurse Care Coordinator [facility] Care Management  [phone]

## 2018-10-11 ENCOUNTER — Other Ambulatory Visit: Payer: Self-pay | Admitting: *Deleted

## 2018-10-11 NOTE — Patient Outreach (Signed)
[facility]) Care Management  10/11/2018  Erica LaundrySusan Stevens Dolce [birth year] [id number]   Case discussed with Yvone NeuPortia Payne and Leafy HalfNicola Williams at [facility].   Patient assessed by Yvone NeuPortia Payne.   Patient has been referred to [facility] Care Management Social Worker for assistance with assisted living facility placement.  No further needs from this RNCM.   Erica Stevens H. Gardiner Barefootooper RN, BSN, CCM [facility] Care Management [facility] Telephonic CM Phone: [phone] Fax: [phone]

## 2018-10-11 NOTE — Addendum Note (Signed)
Addended by: Yvone Neu E on: 10/11/2018 03:58 PM   Modules accepted: Orders

## 2018-10-15 ENCOUNTER — Other Ambulatory Visit: Payer: Self-pay | Admitting: *Deleted

## 2018-10-15 NOTE — Patient Outreach (Signed)
[facility]) Care Management  10/15/2018  Erica Stevens 12-18-1950 [id number]   This social worker received a referral from patient's provider's office to assist with facility placement. This Child psychotherapist contacted primary contact-patent's daughter [name]. Voicemail message left requesting a return call.   Plan: This Child psychotherapist will send patient a contact letter.  This social worker will follow up with daughter within 3-4 business days.  [name] [facility] Care Management [phone]

## 2018-10-19 ENCOUNTER — Other Ambulatory Visit: Payer: Self-pay | Admitting: *Deleted

## 2018-10-19 NOTE — Patient Outreach (Signed)
[facility]) Care Management  10/19/2018  Erica Stevens 12-20-1950 [id number]   Second phone call attempt to assist with facility placement for patient. Patient's daughter/contact did not answer. Voicemail message left requesting a return call.   [name] [facility] Care Management [phone]

## 2018-10-23 ENCOUNTER — Telehealth: Payer: Self-pay

## 2018-10-23 ENCOUNTER — Ambulatory Visit: Payer: Self-pay | Admitting: Pharmacist

## 2018-10-23 DIAGNOSIS — I10 Essential (primary) hypertension: Secondary | ICD-10-CM

## 2018-10-23 DIAGNOSIS — F03B18 Unspecified dementia, moderate, with other behavioral disturbance: Secondary | ICD-10-CM

## 2018-10-23 DIAGNOSIS — I5022 Chronic systolic (congestive) heart failure: Secondary | ICD-10-CM

## 2018-10-23 DIAGNOSIS — F0391 Unspecified dementia with behavioral disturbance: Secondary | ICD-10-CM

## 2018-10-23 NOTE — Chronic Care Management (AMB) (Signed)
  Chronic Care Management   Note  10/23/2018 Name: Erica Stevens MRN: [id number] DOB: 17-Nov-1950   68 y.o. year old female engaged with Chronic Care management team, initial office visit 10/09/18. Chronic conditions include advanced dementia, HTN, CAD, CHF. Today's outreach was a joint call with Yvone Neu, RNCM to assess progression of goals   Was unable to reach patient via telephone today and have left HIPAA compliant voicemail asking patient to return my call. (unsuccessful outreach #1).   Follow up plan: The CM team will reach out to the patient again over the next 7 days.   Karalee Height, PharmD Clinical Pharmacist [facility] [phone]

## 2018-10-25 ENCOUNTER — Other Ambulatory Visit: Payer: Self-pay | Admitting: *Deleted

## 2018-10-25 NOTE — Patient Outreach (Addendum)
[facility]) Care Management  10/25/2018  Erica Stevens 1950-09-15 [id number]   Phone call to patient's daughter to assist with facility placement. Patient's daughter answered the phone, however reported that she was busy at work and unable to talk. Per patient's daughter, she will call this social worker back on Monday.   Plan: This Child psychotherapist to await return call.  Case to be closed if call is not returned by 10/29/18.   [name] [facility] Care Management [phone]

## 2018-10-30 ENCOUNTER — Telehealth: Payer: Self-pay

## 2018-10-30 ENCOUNTER — Ambulatory Visit: Payer: Self-pay | Admitting: Pharmacist

## 2018-10-30 ENCOUNTER — Other Ambulatory Visit: Payer: Self-pay | Admitting: *Deleted

## 2018-10-30 ENCOUNTER — Ambulatory Visit: Payer: Self-pay

## 2018-10-30 DIAGNOSIS — R159 Full incontinence of feces: Secondary | ICD-10-CM | POA: Diagnosis not present

## 2018-10-30 DIAGNOSIS — R413 Other amnesia: Secondary | ICD-10-CM

## 2018-10-30 DIAGNOSIS — I10 Essential (primary) hypertension: Secondary | ICD-10-CM

## 2018-10-30 DIAGNOSIS — M810 Age-related osteoporosis without current pathological fracture: Secondary | ICD-10-CM | POA: Diagnosis not present

## 2018-10-30 DIAGNOSIS — I5022 Chronic systolic (congestive) heart failure: Secondary | ICD-10-CM

## 2018-10-30 DIAGNOSIS — F03B18 Unspecified dementia, moderate, with other behavioral disturbance: Secondary | ICD-10-CM

## 2018-10-30 DIAGNOSIS — Z131 Encounter for screening for diabetes mellitus: Secondary | ICD-10-CM | POA: Diagnosis not present

## 2018-10-30 DIAGNOSIS — N390 Urinary tract infection, site not specified: Secondary | ICD-10-CM | POA: Diagnosis not present

## 2018-10-30 DIAGNOSIS — R32 Unspecified urinary incontinence: Secondary | ICD-10-CM | POA: Diagnosis not present

## 2018-10-30 DIAGNOSIS — F0391 Unspecified dementia with behavioral disturbance: Secondary | ICD-10-CM

## 2018-10-30 DIAGNOSIS — Z79899 Other long term (current) drug therapy: Secondary | ICD-10-CM | POA: Diagnosis not present

## 2018-10-30 DIAGNOSIS — E538 Deficiency of other specified B group vitamins: Secondary | ICD-10-CM | POA: Diagnosis not present

## 2018-10-30 NOTE — Patient Outreach (Signed)
[facility]) Care Management  10/30/2018  Erica Stevens [birth year] [id number]   Patient closed to [facility] care management due to inability to establish/maintain contact.  This Child psychotherapist received a referral from patient's provider's office to assist with facility placement. This Child psychotherapist contacted primary contact-patent's daughter [name]. Voicemail message left requesting a return call on 10/15/18 (outreach letter sent with no response), and 10/19/18. Patient's daughter was reached on 10/25/18, however she stated that she was working and would call this Child psychotherapist on 10/29/18. There was no call received on 10/29/18.   [name] [facility] Care Management [phone]

## 2018-10-30 NOTE — Chronic Care Management (AMB) (Signed)
  Chronic Care Management   Note  10/30/2018 Name: Erica Stevens MRN: [id number] DOB: 25-Oct-1950    68 y.o. year old female referred to Chronic Care Management by Maurice Small, NP. Initial CCM office visit 10/09/18.  Joint outreach call with Yvone Neu, RNCM to assess progression of goals. Noted that [facility] LCSW Chrystal Land was attempting to engage to assist with living facility placement but her calls were not returned.   Was unable to reach patient's daughter Erica Stevens via telephone today and have left HIPAA compliant voicemail asking patient to return my call. (unsuccessful outreach #2).   Follow up plan: The CM team will reach out to the patient again over the next 7 days.  This will be the third and final outreach attempt.    Karalee Height, PharmD Clinical Pharmacist [facility] [phone]

## 2018-10-30 NOTE — Telephone Encounter (Signed)
Copied from CRM [id number]. Topic: General - Other >> Oct 30, 2018  1:43 PM Jilda Roche wrote: Reason for CRM: [name], patients daughter is calling about the Fl2 form  for Medicaid that was supposed to be filled out about 2 weeks ago  Best call back is [phone]

## 2018-10-30 NOTE — Telephone Encounter (Signed)
Copied from CRM #[id number]. Topic: General - Other >> Oct 30, 2018  1:43 PM Demaray, Melissa wrote: Reason for CRM: [name], patients daughter is calling about the Fl2 form  for Medicaid that was supposed to be filled out about 2 weeks ago  Best call back is [phone] 

## 2018-10-30 NOTE — Chronic Care Management (AMB) (Signed)
  Chronic Care Management   Note  10/30/2018 Name: Erica Stevens MRN: [id number] DOB: 08/07/1951    67 y.o. year old female referred to Chronic Care Management by Emily Boyce, NP. Initial CCM office visit 10/09/18.  Joint outreach call with Portia Payne, RNCM to assess progression of goals. Noted that [facility] LCSW Chrystal Land was attempting to engage to assist with living facility placement but her calls were not returned.   Was unable to reach patient's daughter [name] via telephone today and have left HIPAA compliant voicemail asking patient to return my call. (unsuccessful outreach #2).   Follow up plan: The CM team will reach out to the patient again over the next 7 days.  This will be the third and final outreach attempt.    Usama Harkless, PharmD Clinical Pharmacist [facility] [phone]  

## 2018-10-31 NOTE — Telephone Encounter (Signed)
Spoke with Daniel Nones and they have been trying to engage with [name] the patient daughter multiple times by phone with no success. She has answered and informed them she was busy. Chrystal Land has already tried to reach out to her regarding the FL-2 Form. I called Carried back today at 11:30 a.m. and was unable to reach her. But left a message to call Chrystal Land back and keep her appointment with doctor Sowles on 11/05/2018. Also gave her this information below:  [name] [facility] Care Management [phone]

## 2018-10-31 NOTE — Telephone Encounter (Signed)
Spoke with Sheron Nightingale, patient daughter and informed her that I had no seen this FL-2 Form. However, they usually require a face to face encounter and she has an upcoming appointment on 11/05/18 and it can be filled out then. [name] states Raynelle Fanning and [facility] were working on this and going to call her back with the progress of it. Just wondered was this form filled out and given to Dr. Carlynn Purl?

## 2018-10-31 NOTE — Telephone Encounter (Signed)
I did not receive an FL-2 form at the visit with me - my last visit with the patient was over a month ago.  If there is an FL-2 that needs to be completed, they need to come in for follow up - it looks like they are seeing Dr. Carlynn Purl 11/05/2018.

## 2018-11-01 ENCOUNTER — Other Ambulatory Visit: Payer: Self-pay | Admitting: Acute Care

## 2018-11-01 DIAGNOSIS — R413 Other amnesia: Secondary | ICD-10-CM

## 2018-11-05 ENCOUNTER — Ambulatory Visit: Payer: Self-pay | Admitting: Family Medicine

## 2018-11-06 ENCOUNTER — Telehealth: Payer: Self-pay

## 2018-11-06 ENCOUNTER — Ambulatory Visit (INDEPENDENT_AMBULATORY_CARE_PROVIDER_SITE_OTHER): Payer: Medicare HMO

## 2018-11-06 DIAGNOSIS — I10 Essential (primary) hypertension: Secondary | ICD-10-CM | POA: Diagnosis not present

## 2018-11-06 DIAGNOSIS — I5022 Chronic systolic (congestive) heart failure: Secondary | ICD-10-CM

## 2018-11-06 NOTE — Patient Instructions (Signed)
  Thank you allowing the Chronic Care Management Team to be a part of your care! It was a pleasure speaking with you today!  1. Please call PCP office and reschedule missed appointment with Dr. Carlynn Purl so that [facility] can be completed. This appointment can be made with Dr. Carlynn Purl or Maurice Small  2. Please call Chrystal Land, LCSW with [facility]. She will be able to assist you and [name] with the long term care Medicaid application and finding placement.  CCM (Chronic Care Management) Team   Yvone Neu RN, BSN Nurse Care Coordinator  [phone]  Karalee Height PharmD  Clinical Pharmacist  [phone]   Goals Addressed            This Visit's Progress   . "I don't have a living will" (pt-stated)   Not on track    Current Barriers:  Marland Kitchen Memory loss .  Marland Kitchen   Nurse Case Manager Clinical Goal(s): Over the next 14 days, Erica Stevens and daughter Erica Stevens will complete advanced directive paperwork and get it notarized (for example, at your bank) and return to [place] [employer] and safety inspector) at [employer]  Interventions:  . Provided advanced directive paperwork . Counseled on the importance of end of life decision making   Patient Self Care Activities:  . Complete paperwork   *initial goal documentation       . "I want to get my own appartment" (pt-stated)   Not on track    Current Barriers:  . Dementia . finance . Knowledge deficit related to resources available for dementia care/facility placement  Nurse Case Manager Clinical Goal(s):  Goal modified 11/06/2018  Over the next 14 days, daughter Erica Stevens will engage with [facility] LCSW Chrystal Land for assistance with obtaining long term medicaid and placement for  Patient  Over the next 14 days, daughter Erica Stevens will reschedule missed appointment with PCP to facilitate FL2 completion  Interventions:  . Reinforced need for collaboration with [facility] LCSW . Reinforced need for rescheduling patients recent missed appointment with  PCP . Provided active listening and emotional support to daughter who's chronically ill husband is currently hospitalized [facility]  Patient Self Care Activities:  . Daughter will reschedule missed PCP appointment . Daughter will contact Chrystal Land LCSW   Please see past updates in goals section for documentation of goal progression         The patient verbalized understanding of instructions provided today and declined a print copy of patient instruction materials.   Telephone follow up appointment with CCM team member scheduled for: 2 weeks

## 2018-11-06 NOTE — Chronic Care Management (AMB) (Signed)
  Chronic Care Management   Follow Up Note   11/06/2018 Name: Erica Stevens MRN: [id number] DOB: 01-01-1951  Referred by: Steele Sizer, MD Reason for referral : No chief complaint on file.   Subjective: per daughter Erica Stevens "I just need that FL2 so that I can take it to the medicaid office"   Objective:  Assessment:  Erica Stevens, [facility] the CCM team to consult 09/24/2018 for assistance with chronic disease management related to caregiver strain (Erica Stevens daughter) and nursing care options.EricaStevens/daughter [name] that CCM services would be helpful to them. CCM Team met with Erica Stevens and daughter Erica Stevens face to face to discuss medication compliance and caregiver/facility options on 10/09/2018. Referral was placed to [facility] LCSW for assistance with application to LTC Medicaid. Per EMR LCSW closed referral secondary to inability to make contact with patients daughter. Today, CCM RN CM follow up with daughter to assess plan for long term care placement for Erica Stevens.  Goals Addressed            This Visit's Progress   . 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 Provided advanced directive paperwork . Counseled on the importance of end of life decision making   Patient Self Care Activities:  . Complete paperwork   *initial goal documentation       . "I want to get my own appartment" (pt-stated)   Not on track    Daughter Erica Stevens states she did not understand importance of engaging with LCSW. Erica Stevens also states she did not understand that patient needs to be seen prior to completion of FL2. Erica Stevens admits to caregiver strain. Her husband is currently  hospitalized at [facility] and is unable to help care for her mother and her husband. Patients appointment with Dr. Ancil Boozer 11/05/2018 was canseled secondary to lack of transportation as daughter was with husband.  Current Barriers:  . Dementia (patient) . finance . Knowledge deficit related to resources available for dementia care/facility placement  Nurse Case Manager Clinical Goal(s):  Goal modified 11/06/2018  Over the next 14 days, daughter Erica Stevens will engage with [place] for assistance with obtaining long term medicaid and placement for  Patient  Over the next 14 days, daughter Erica Stevens will reschedule missed appointment with PCP to facilitate FL2 completion  Interventions:  . Reinforced need for collaboration with [facility] LCSW . Reinforced need for rescheduling patients recent missed appointment with PCP . Provided active listening and emotional support to daughter who's chronically ill husband is currently hospitalized at [facility]  Patient Self Care Activities:  . Daughter will reschedule missed PCP appointment . Daughter will contact [facility] LCSW   Please see past updates in goals section for documentation of goal progression          Telephone follow up appointment with CCM team member scheduled for: 2 weeks     Dallas Torok E. Rollene Rotunda, RN, BSN Nurse Care Coordinator [facility] / [facility] Care Management  [phone]

## 2018-11-09 ENCOUNTER — Ambulatory Visit
Admission: RE | Admit: 2018-11-09 | Discharge: 2018-11-09 | Disposition: A | Discharge status: Home / Self Care | Payer: Medicare HMO | Source: Ambulatory Visit | Attending: Acute Care | Admitting: Acute Care

## 2018-11-09 DIAGNOSIS — R413 Other amnesia: Secondary | ICD-10-CM | POA: Diagnosis not present

## 2018-11-13 ENCOUNTER — Ambulatory Visit: Payer: Self-pay | Admitting: *Deleted

## 2018-11-13 ENCOUNTER — Ambulatory Visit: Payer: Medicare HMO | Admitting: Pharmacist

## 2018-11-13 DIAGNOSIS — F03B18 Unspecified dementia, moderate, with other behavioral disturbance: Secondary | ICD-10-CM

## 2018-11-13 DIAGNOSIS — E538 Deficiency of other specified B group vitamins: Secondary | ICD-10-CM | POA: Diagnosis not present

## 2018-11-13 DIAGNOSIS — I5022 Chronic systolic (congestive) heart failure: Secondary | ICD-10-CM

## 2018-11-13 DIAGNOSIS — F0391 Unspecified dementia with behavioral disturbance: Secondary | ICD-10-CM

## 2018-11-13 DIAGNOSIS — R413 Other amnesia: Secondary | ICD-10-CM

## 2018-11-13 NOTE — Patient Instructions (Addendum)
Licensed Clinical Social Worker Visit Information  Goals we discussed today:  Goals Addressed            This Visit's Progress   . "per daughter, I need help placing mom in a facility" (pt-stated)       Current Barriers:  . Limited social support . Level of care concerns . ADL IADL limitations . Memory Deficits . Inability to perform ADL's independently . Inability to perform IADL's independently  Clinical Social Work Clinical Goal(s):  Marland Kitchen Over the next 30 days, client will work with SW to address concerns related to placement in facility care  Interventions: . Patient interviewed and appropriate assessments performed . Provided patient with information about process for long term care medicaid . Discussed plans with patient for ongoing care management follow up and provided patient with direct contact information for care management team . Advised patient to keep her scheduled appointment with pcp on 3/16 . Provided education to patient/caregiver regarding level of care options. . CSW called Department of Social Services to clarify transition from community medicaid to long term care medicaid  Patient Self Care Activities:  . Attends all scheduled provider appointments  .    Plan:  . Patient's daughter will keep appointment scheduled with the provider to obtain [facility] for placement in ALF  Initial goal documentation         Materials Provided: Verbal education about placement process provided by phone  Follow Up Plan: Next PCP appointment scheduled for:  11/26/18

## 2018-11-13 NOTE — Chronic Care Management (AMB) (Signed)
  Chronic Care Management   Follow Up Note   11/13/2018 Name: Erica Stevens MRN: [id number] DOB: August 15, 1951  Referred by: [name], MD Reason for referral : Chronic Care Management (Pharmacy follow up )  Subjective: Erica Stevens is a 68 y.o. year old female who is a primary care patient of [name], MD. The CCM team was consulted for assistance with chronic disease management and care coordination needs. Successful follow up outreach with daughter Erica Stevens (DPR on file). HIPAA identifiers verified.   Objective:  Patient is now taking oral Vitamin B12 tablets as well as weekly B12 injections with [facility] Morrie Sheldon Step, NP) and Vitamin D3. Updated medication list.   Travelling to weekly injection is placing a great strain on patient's daughter.   Review of patient status, including review of consultants reports, relevant laboratory and other test results, and collaboration with appropriate care team members and the patient's provider was performed as part of comprehensive patient evaluation and provision of chronic care management services.    Goals Addressed            This Visit's Progress   . "She forgets to take her medications" (pt-stated)       Current Barriers:  . Dementia . Requires additional care . Caregiver strain   Pharmacist Clinical Goal(s): Over the next 90 days, Erica Stevens will be adherent to all medications as evidenced by a percentage of days covered [facility]) report of >80%.   Interventions: . Convert all medications to morning . CCM pharmacist Raynelle Fanning) will collaborate with Irving Burton to eliminate twice daily enalapril and replace it with a once daily regimen . CCM pharmacist will collaborate with Neurology to simplify B12 deficiency regimen  Patient Self Care Activities:  . Take all medications as prescribed  Please see past updates related to this goal by clicking on the "Past Updates" button in the selected goal          Plan: CCM  pharmacist will engage [facility] Neurology to assess ability to give B12 injections at home or transition to oral medications. Explore alternatives to anemia treatments based on lab values.   Next PCP appointment scheduled for:  March 16 at 8:40 am with Dr. [name] CCM pharmacy follow up:  2 weeks  Karalee Height, PharmD Clinical Pharmacist [facility] [phone]

## 2018-11-13 NOTE — Patient Instructions (Signed)
Goals Addressed            This Visit's Progress   . "She forgets to take her medications" (pt-stated)       Current Barriers:  . Dementia . Requires additional care . Caregiver strain   Pharmacist Clinical Goal(s): Over the next 90 days, [name] will be adherent to all medications as evidenced by a percentage of days covered [facility]) report of >80%.   Interventions: . Convert all medications to morning . CCM pharmacist Raynelle Fanning) will collaborate with Irving Burton to eliminate twice daily enalapril and replace it with a once daily regimen . CCM pharmacist will collaborate with Neurology to simplify B12 deficiency regimen  Patient Self Care Activities:  . Take all medications as prescribed  Please see past updates related to this goal by clicking on the "Past Updates" button in the selected goal          The patient verbalized understanding of instructions provided today and declined a print copy of patient instruction materials.

## 2018-11-13 NOTE — Chronic Care Management (AMB) (Signed)
  Chronic Care Management    Clinical Social Work General Note  11/13/2018 Name: Erica Stevens MRN: [id number] DOB: [birth year]  Erica Stevens is a 68 y.o. year old female who is a primary care patient of [name], MD. CCM RNCM asked this social worker to consult the patient for assistance with Level of Care Concerns.    Review of patient status, including review of consultants reports, relevant laboratory and other test results, and collaboration with appropriate care team members and the patient's provider was performed as part of comprehensive patient evaluation and provision of chronic care management services.    Depression screen [facility] 2/9 09/24/2018 02/24/2016 10/01/2015  Decreased Interest 0 0 0  Down, Depressed, Hopeless 0 0 0  PHQ - 2 Score 0 0 0  Altered sleeping 0 - -  Tired, decreased energy 0 - -  Change in appetite 0 - -  Feeling bad or failure about yourself  0 - -  Trouble concentrating 0 - -  Moving slowly or fidgety/restless 0 - -  Suicidal thoughts 0 - -  PHQ-9 Score 0 - -  Difficult doing work/chores Not difficult at all - -     No flowsheet data found.   Goals Addressed            This Visit's Progress   . "per daughter, I need help placing mom in a facility" (pt-stated)        Patient's daughter discussed feeling overwhelmed with caregiving responsibilities. Per patient's daughter able to recognize the benefits of patient being transferred to a ALF.   Current Barriers:  . Limited social support . Level of care concerns . ADL IADL limitations . Memory Deficits . Inability to perform ADL's independently . Inability to perform IADL's independently  Clinical Social Work Clinical Goal(s):  Marland Kitchen Over the next 30 days, client will work with SW to address concerns related to placement in facility care  Interventions: . Patient interviewed and appropriate assessments performed . Provided patient with information about process for long term care  medicaid . Discussed plans with patient for ongoing care management follow up and provided patient with direct contact information for care management team . Advised patient to keep her scheduled appointment with pcp on 3/16 . Provided education to patient/caregiver regarding level of care options. . CSW contacted the Department of Social Services to discuss transition process from community medicaid to long term care medicaid for placement purposes.   Patient Self Care Activities:  . Attends all scheduled provider appointments  .    Plan:  . Patient's daughter will keep appointment scheduled with the provider to obtain [facility] for placement in ALF  Initial goal documentation         <no information>  Follow Up Plan: This social worker to follow up with patient's daughter following PCP appointment on 11/26/18.      Verna Czech, LCSW CCM Clinical Child psychotherapist [employer] [phone]

## 2018-11-20 DIAGNOSIS — E538 Deficiency of other specified B group vitamins: Secondary | ICD-10-CM | POA: Diagnosis not present

## 2018-11-22 ENCOUNTER — Telehealth: Payer: Self-pay

## 2018-11-26 ENCOUNTER — Encounter: Payer: Self-pay | Admitting: Family Medicine

## 2018-11-26 ENCOUNTER — Other Ambulatory Visit: Payer: Self-pay

## 2018-11-26 ENCOUNTER — Ambulatory Visit (INDEPENDENT_AMBULATORY_CARE_PROVIDER_SITE_OTHER): Payer: Medicare HMO | Admitting: Family Medicine

## 2018-11-26 VITALS — BP 142/78 | HR 62 | Temp 97.5°F | Resp 14 | Ht 60.0 in | Wt 124.6 lb

## 2018-11-26 DIAGNOSIS — E785 Hyperlipidemia, unspecified: Secondary | ICD-10-CM | POA: Diagnosis not present

## 2018-11-26 DIAGNOSIS — F03B18 Unspecified dementia, moderate, with other behavioral disturbance: Secondary | ICD-10-CM

## 2018-11-26 DIAGNOSIS — I700 Atherosclerosis of aorta: Secondary | ICD-10-CM

## 2018-11-26 DIAGNOSIS — R413 Other amnesia: Secondary | ICD-10-CM | POA: Diagnosis not present

## 2018-11-26 DIAGNOSIS — I10 Essential (primary) hypertension: Secondary | ICD-10-CM

## 2018-11-26 DIAGNOSIS — Z1159 Encounter for screening for other viral diseases: Secondary | ICD-10-CM | POA: Diagnosis not present

## 2018-11-26 DIAGNOSIS — Z716 Tobacco abuse counseling: Secondary | ICD-10-CM | POA: Diagnosis not present

## 2018-11-26 DIAGNOSIS — F0391 Unspecified dementia with behavioral disturbance: Secondary | ICD-10-CM | POA: Diagnosis not present

## 2018-11-26 DIAGNOSIS — E538 Deficiency of other specified B group vitamins: Secondary | ICD-10-CM | POA: Diagnosis not present

## 2018-11-26 NOTE — Progress Notes (Signed)
Name: Erica Stevens   MRN: [id number]    DOB: [birth year]   Date:11/26/2018       Progress Note  Subjective  Chief Complaint  Chief Complaint  Patient presents with  . Form Completion    FL2    HPI  Pt presents for FL-2 form completion.  She has not been seen since 09/24/2018 where she presented with her daughter [name] Coombs) and ex-husband for concern for worsening dementia, HTN, and HLD.   Dementia: Patient and famiyl have been working with our Chronic Care Management Team - Social work and Financial risk analyst to seek nursing home placement.  She is seeing Dr. Malvin Johns and Lindi Adie NP-C with neurology, taking aricept.  She is having urine and bowel incontinence now (fights with caregivers about changing her soiled Depends), eating and drinking without assistance, she walks with a cane and sometimes needs to be pushed in wheelchair for long walks.  Daughter notes that patient's memory is worse than last visit. Pt is not driving, finances are done by daughter, meal prep is done by family members.  Is needing medication reminders (daughter calls her in the morning)  Did have MRI Brain performed 11/12/2018 with neurology and results are below: IMPRESSION: Chronic brain atrophy and ventriculomegaly. Chronic small-vessel ischemic change of the white matter. 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 Ventriculomegaly is consistent with central atrophy. It is not possible to exclude the possibility of normal pressure hydrocephalus however. Electronically Signed   By: Paulina Fusi M.D.   On: 11/09/2018 13:33  HTN: Taking enalapril once daily (per Karalee Height PharmD to decrease pill burden), metoprolol, and amlodipine.  Doing well on this regimen. She denies chest pain, shortness of breath, BLE edema.  HLD/Atherosclerosis of Aorta/History Triple Bypass: She is prescribed atorvastatin 80mg .  No concerns for expressions of  pain or shortness of breath.  Taking ASA daily.  Recent Labs: She had CMP, CBC, A1C that were overall normal. Vitamin D was a little low (taking supplement).  B12 level was low and is receiving shots and taking PO for this at Neurology.  Thyroid level was normal.  Needs Hep C and Lipid panel today.  Tobacco Abuse: Smoking about 1/2-1ppd. Not ready to quit at this time, counseling is provided.  Patient Active Problem List   Diagnosis Date Noted  . History of drug dependence/abuse (HCC) 08/01/2018  . Chronic systolic congestive heart failure (HCC) 08/01/2018  . Atherosclerosis of aorta (HCC) 05/22/2017  . Fracture of thoracic spine (HCC) 05/22/2017  . Slurred speech 03/01/2017  . Opiate overdose (HCC) 01/13/2017  . Cocaine abuse (HCC) 01/13/2017  . Benzodiazepine abuse (HCC) 01/13/2017  . Heroin abuse (HCC) 05/03/2016  . Anoxia 05/03/2016  . Moderate dementia with behavioral disturbance (HCC) 05/03/2016  . Accidental heroin overdose (HCC) 05/03/2016  . Severe recurrent major depression (HCC) 05/01/2016  . Angina pectoris (HCC) 02/24/2016  . Cervical disc disease 10/02/2015  . Affective disorder, major 07/02/2015  . Benign hypertensive heart disease 03/09/2015  . Migraine 03/09/2015  . Billowing mitral valve 03/09/2015  . Osteoporosis 03/09/2015  . Urge incontinence 03/09/2015  . Chest pressure 06/21/2013  . Leg edema 06/21/2013  . Shortness of breath 06/21/2013  . CAD (coronary artery disease) of artery bypass graft 06/21/2013  . Hyperlipidemia 06/21/2013  . Chronic pain 03/25/2009  . Hypercholesterolemia without hypertriglyceridemia 05/22/2007  . Coronary atherosclerosis 05/22/2007  . 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 Brachial neuritis 12/29/2006  Past Surgical History:  Procedure Laterality Date  . ABDOMINAL HYSTERECTOMY    . BLADDER SURGERY    . BYPASS GRAFT    . CARDIAC CATHETERIZATION  2014   [facility]  . CHOLECYSTECTOMY    . COLONOSCOPY    . CORONARY ARTERY BYPASS  GRAFT     x3 grafts;[facility]  . fasciotomy      Family History  Problem Relation Age of Onset  . Hypertension Mother   . Hyperlipidemia Mother   . Heart disease Mother   . COPD Sister   . Depression Daughter   . Drug abuse Son     Social History   Socioeconomic History  . Marital status: Divorced    Spouse name: Not on file  . Number of children: Not on file  . Years of education: Not on file  . Highest education level: Not on file  Occupational History  . Not on file  Social Needs  . Financial resource strain: Not on file  . Food insecurity:    Worry: Not on file    Inability: Not on file  . Transportation needs:    Medical: Not on file    Non-medical: Not on file  Tobacco Use  . Smoking status: Current Every Day Smoker    Packs/day: 1.50    Types: Cigarettes  . Smokeless tobacco: Never Used  Substance and Sexual Activity  . Alcohol use: No  . Drug use: Yes    Types: Cocaine, Heroin, Hydrocodone, Benzodiazepines    Comment: quit 04-2017  . Sexual activity: Not Currently  Lifestyle  . Physical activity:    Days per week: Not on file    Minutes per session: Not on file  . Stress: Not on file  Relationships  . Social connections:    Talks on phone: Not on file    Gets together: Not on file    Attends religious service: Not on file    Active member of club or organization: Not on file    Attends meetings of clubs or organizations: Not on file    Relationship status: Not on file  . Intimate partner violence:    Fear of current or ex partner: Not on file    Emotionally abused: Not on file    Physically abused: Not on file    Forced sexual activity: Not on file  Other Topics Concern  . Not on file  Social History Narrative   She moved in with her daughter and grandchildren August 2018.  Now with her mother. 10-02-17.   Used to live by herself, divorced, has 2 children.  Education: HS.     Long history of depression, since delivery of her children         Current Outpatient Medications:  .  amLODipine (NORVASC) 5 MG tablet, Take 1 tablet (5 mg total) by mouth daily., Disp: 90 tablet, Rfl: 1 .  aspirin 81 MG tablet, Take 1 tablet (81 mg total) by mouth daily., Disp: 90 tablet, Rfl: 1 .  atorvastatin (LIPITOR) 80 MG tablet, Take 1 tablet (80 mg total) by mouth daily., Disp: 90 tablet, Rfl: 1 .  donepezil (ARICEPT) 5 MG tablet, Take 5 mg by mouth daily., Disp: , Rfl:  .  enalapril (VASOTEC) 10 MG tablet, Take 1 tablet (10 mg total) by mouth 2 (two) times daily., Disp: 180 tablet, Rfl: 1 .  metoprolol succinate (TOPROL-XL) 25 MG 24 hr tablet, TAKE ONE (1) TABLET EACH DAY, Disp: 90 tablet, Rfl: 1 .  vitamin B-12 (CYANOCOBALAMIN) 500 MCG tablet, Take 500 mcg by mouth daily., Disp: , Rfl:  .  VITAMIN D, CHOLECALCIFEROL, PO, Take by mouth., Disp: , Rfl:   Allergies  Allergen Reactions  . Erythromycin     Other reaction(s): Unknown  . Ivp Dye [Iodinated Diagnostic Agents]   . Sulfa Antibiotics     Stated turns orange  . Sulfur     I personally reviewed active problem list, medication list, allergies, notes from last encounter, lab results with the patient/caregiver today.   ROS  Constitutional: Negative for fever or weight change.  Respiratory: Negative for cough and shortness of breath.   Cardiovascular: Negative for chest pain or palpitations.  Gastrointestinal: Negative for abdominal pain, no bowel changes.  Musculoskeletal: Negative for gait problem or joint swelling.  Skin: Negative for rash.  Neurological: Negative for dizziness or headache.  No other specific complaints in a complete review of systems (except as listed in HPI above).  Objective  Vitals:   11/26/18 0907  BP: (!) 142/78  Pulse: 62  Resp: 14  Temp: (!) 97.5 F (36.4 C)  TempSrc: Oral  SpO2: 99%  Weight: 124 lb 9.6 oz (56.5 kg)  Height: 5' (1.524 m)   Body mass index is 24.33 kg/m.  Physical Exam  Constitutional: Patient appears well-developed and  well-nourished. No distress.  HENT: Head: Normocephalic and atraumatic. Ears: bilateral TMs with no erythema or effusion; Nose: Nose normal. Mouth/Throat: Oropharynx is clear and moist. No oropharyngeal exudate or tonsillar swelling.  Eyes: Conjunctivae and EOM are normal. No scleral icterus.  Pupils are equal, round, and reactive to light.  Neck: Normal range of motion. Neck supple. No JVD present. No thyromegaly present.  Cardiovascular: Normal rate, regular rhythm and normal heart sounds.  No murmur heard. No BLE edema. Pulmonary/Chest: Effort normal and breath sounds normal. No respiratory distress. Abdominal: Soft. Bowel sounds are normal, no distension. There is no tenderness. No masses. Musculoskeletal: Normal range of motion, no joint effusions. No gross deformities Neurological: Pt is alert and oriented to person, place, and time. No cranial nerve deficit. Coordination, balance, strength, speech and gait are normal.  Skin: Skin is warm and dry. No rash noted. No erythema.  Psychiatric: Patient has a normal mood and affect. behavior is normal. Judgment and thought content normal.  No results found for this or any previous visit (from the past 72 hour(s)).  PHQ2/9: Depression screen [facility] 2/9 11/26/2018 09/24/2018 02/24/2016 10/01/2015 08/20/2015  Decreased Interest 0 0 0 0 1  Down, Depressed, Hopeless 0 0 0 0 0  PHQ - 2 Score 0 0 0 0 1  Altered sleeping 0 0 - - -  Tired, decreased energy 0 0 - - -  Change in appetite 0 0 - - -  Feeling bad or failure about yourself  0 0 - - -  Trouble concentrating 0 0 - - -  Moving slowly or fidgety/restless 0 0 - - -  Suicidal thoughts 0 0 - - -  PHQ-9 Score 0 0 - - -  Difficult doing work/chores Not difficult at all Not difficult at all - - -   PHQ-2/9 Result is negative.    Fall Risk: Fall Risk  11/26/2018 09/24/2018 06/19/2017 05/22/2017 02/24/2016  Falls in the past year? 0 0 Yes Yes No  Number falls in past yr: 0 0 2 or more 2 or more -  Injury  with Fall? 0 0 Yes Yes -  Risk Factor Category  - - High  Fall Risk High Fall Risk -  Risk for fall due to : - Impaired balance/gait;Impaired mobility History of fall(s) History of fall(s) -  Follow up Falls evaluation completed - Education provided Falls evaluation completed -   Assessment & Plan  1. Moderate dementia with behavioral disturbance (HCC) - Continue follow up with Dr. Malvin Johns.  2. Memory loss - Continue follow up with Dr. Malvin Johns.  3. Essential hypertension - DASH diet discussed, current goal is <150/90, she is at goal today.  Continue current medication regimen.  4. Dyslipidemia - Continue - Lipid panel  5. B12 deficiency - Continue injections and PO supplementation with Neurology  6. Atherosclerosis of aorta (HCC) - Continue ASA and Statin therapy, discussed increased risk of CV complications. - Lipid panel  7. Need for hepatitis C screening test - Hepatitis C antibody  8. Tobacco abuse counseling - Not ready for cessation.  FL-2 form is completed, copy to be placed in chart and given to the patient's daughter to use to obtain SNF/ALF placement with our CCM team/Social worker.

## 2018-11-27 ENCOUNTER — Telehealth: Payer: Self-pay

## 2018-11-27 DIAGNOSIS — E538 Deficiency of other specified B group vitamins: Secondary | ICD-10-CM | POA: Diagnosis not present

## 2018-11-27 DIAGNOSIS — G9389 Other specified disorders of brain: Secondary | ICD-10-CM | POA: Diagnosis not present

## 2018-11-27 LAB — LIPID PANEL
CHOL/HDL RATIO: 3.2 (calc) (ref <5.0)
Cholesterol: 158 mg/dL (ref <200)
HDL: 50 mg/dL (ref >=50)
LDL CHOLESTEROL (CALC): 88 mg/dL
NON-HDL CHOLESTEROL (CALC): 108 mg/dL (ref <130)
TRIGLYCERIDES: 106 mg/dL (ref <150)

## 2018-11-27 LAB — HEPATITIS C ANTIBODY
Hepatitis C Ab: NONREACTIVE
SIGNAL TO CUT-OFF: 0.01 (ref <1.00)

## 2018-11-28 ENCOUNTER — Telehealth: Payer: Self-pay

## 2018-11-28 ENCOUNTER — Other Ambulatory Visit: Payer: Self-pay

## 2018-11-28 ENCOUNTER — Ambulatory Visit (INDEPENDENT_AMBULATORY_CARE_PROVIDER_SITE_OTHER): Payer: Medicare HMO | Admitting: *Deleted

## 2018-11-28 DIAGNOSIS — F332 Major depressive disorder, recurrent severe without psychotic features: Secondary | ICD-10-CM | POA: Diagnosis not present

## 2018-11-28 DIAGNOSIS — I5022 Chronic systolic (congestive) heart failure: Secondary | ICD-10-CM

## 2018-11-28 DIAGNOSIS — F03B18 Unspecified dementia, moderate, with other behavioral disturbance: Secondary | ICD-10-CM

## 2018-11-28 DIAGNOSIS — F0391 Unspecified dementia with behavioral disturbance: Secondary | ICD-10-CM | POA: Diagnosis not present

## 2018-11-28 NOTE — Patient Instructions (Signed)
Thank you allowing the Chronic Care Management Team to be a part of your care! It was a pleasure speaking with you today!  1. Please contact patient's Medicaid worker to request that patient's medicaid be transferred from community Medicaid to Special Assistance Medicaid for placement purposes.   CCM (Chronic Care Management) Team   Yvone Neu RN, BSN Nurse Care Coordinator  [phone]  Karalee Height PharmD  Clinical Pharmacist  [phone]   Verna Czech, LCSW Clinical Social Worker [phone]  Goals Addressed            This Visit's Progress   . "per daughter, I need help placing mom in a facility" (pt-stated)       Current Barriers:  . Limited social support . Level of care concerns . ADL IADL limitations . Memory Deficits . Inability to perform ADL's independently . Inability to perform IADL's independently  Clinical Social Work Clinical Goal(s):  Marland Kitchen Over the next 30 days, client will work with SW to address concerns related to placement in facility care  Interventions: . Provided patient's daughter with information about process for long term care medicaid and encouraged her to contact patient's Medicaid worker to discuss need for patient's community Medicaid  be changed to special assistance Medicaid for placement purposes. . Discussed plans with patient for ongoing care management follow up and provided patient with direct contact information for care management team . Confirmed that patient kept appointment on 11/26/18 at that the [facility] was completed for long term care. . Provided education to caregiver regarding level of care options and discussed plan for this social worker to begin process of sending out [facility] for potential bed offers.   Patient Self Care Activities:  . Attends all scheduled provider appointments  .    Plan:  . Patient's daughter will contact patient's medicaid worker to discuss transition from community medicaid to special  assistance Medicaid for placement purposes. . This Child psychotherapist will begin process of sending patient's FL2 to Assisted Living Facilities to be reviewed for potential  bed offers.  Please see past updates related to this goal by clicking on the "Past Updates" button in the selected goal          The patient verbalized understanding of instructions provided today and declined a print copy of patient instruction materials.   Telephone follow up appointment with CCM team member scheduled for: 12/04/18

## 2018-11-28 NOTE — Chronic Care Management (AMB) (Signed)
  Chronic Care Management    Clinical Social Work Follow Up Note  11/28/2018 Name: Erica Stevens MRN: [id number] DOB: Jan 15, 1951  Erica Stevens is a 68 y.o. year old female who is a primary care patient of [name], MD. The CCM team was consulted for assistance with Level of Care Concerns.   Review of patient status, including review of consultants reports, other relevant assessments, and collaboration with appropriate care team members and the patient's provider was performed as part of comprehensive patient evaluation and provision of chronic care management services.     Goals Addressed            This Visit's Progress   . "per daughter, I need help placing mom in a facility" (pt-stated)       Telephone call to patient's daughter to discuss patient's level of care needs. Patient's daughter continues to admit to caregiver strain as patient's condition continues to progress. Per patient's daughter, patient is not able to perform her ADL's without reminders. Patient incontinent of stool and urine. Her short term memory continues to diminish. 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 Per patient's daughter, patient has a history of substance abuse, however has been clean for the last 2 years.  Current Barriers:  . Limited social support . Level of care concerns . ADL IADL limitations . Memory Deficits . Inability to perform ADL's independently . Inability to perform IADL's independently  Clinical Social Work Clinical Goal(s):  Marland Kitchen Over the next 30 days, client will work with SW to address concerns related to placement in facility care  Interventions: . Provided patient's daughter with information about process for long term care medicaid and encouraged her to contact patient's Medicaid worker to discuss need for patient's community Medicaid  be changed to special assistance Medicaid for placement purposes. . Discussed plans with patient  for ongoing care management follow up and provided patient with direct contact information for care management team . Confirmed that patient kept appointment on 11/26/18 at that the [facility] was completed for long term care. . Provided education to caregiver regarding level of care options and discussed plan for this social worker to begin process of sending out [facility] for potential bed offers.   Patient Self Care Activities:  . Attends all scheduled provider appointments  .    Plan:  . Patient's daughter will contact patient's medicaid worker to discuss transition from community medicaid to special assistance Medicaid for placement purposes. . This Child psychotherapist will begin process of sending patient's FL2 to Assisted Living Facilities to be reviewed for potential  bed offers.  Please see past updates related to this goal by clicking on the "Past Updates" button in the selected goal          Follow Up Plan: SW will follow up with patient by phone over the next 7 days regarding patient's level of care needs.   Verna Czech, LCSW Clinical [facility] Care Management [phone]

## 2018-12-04 ENCOUNTER — Telehealth: Payer: Self-pay

## 2018-12-04 ENCOUNTER — Ambulatory Visit: Payer: Self-pay | Admitting: *Deleted

## 2018-12-04 ENCOUNTER — Other Ambulatory Visit: Payer: Self-pay

## 2018-12-04 DIAGNOSIS — F0391 Unspecified dementia with behavioral disturbance: Secondary | ICD-10-CM | POA: Diagnosis not present

## 2018-12-04 DIAGNOSIS — F03B18 Unspecified dementia, moderate, with other behavioral disturbance: Secondary | ICD-10-CM

## 2018-12-04 DIAGNOSIS — F332 Major depressive disorder, recurrent severe without psychotic features: Secondary | ICD-10-CM

## 2018-12-04 DIAGNOSIS — I5022 Chronic systolic (congestive) heart failure: Secondary | ICD-10-CM | POA: Diagnosis not present

## 2018-12-04 NOTE — Patient Instructions (Signed)
Thank you allowing the Chronic Care Management Team to be a part of your care! It was a pleasure speaking with you today!  1. Please be sure to contact patient's Medicaid worker to discuss transitioning her medicaid to special assistance medicaid for placement purposes.   CCM (Chronic Care Management) Team   Yvone Neu RN, BSN Nurse Care Coordinator  [phone]  Karalee Height PharmD  Clinical Pharmacist  [phone]   Verna Czech, LCSW Clinical Social Worker [phone]  Goals Addressed            This Visit's Progress   . "per daughter, I need help placing mom in a facility" (pt-stated)       Current Barriers:  . Limited social support . Level of care concerns . ADL IADL limitations . Memory Deficits . Inability to perform ADL's independently . Inability to perform IADL's independently  Clinical Social Work Clinical Goal(s):  Marland Kitchen Over the next 30 days, client will work with SW to address concerns related to placement in facility care  Interventions: . Continued to encourage patient's daughter to contact patient's Medicaid worker to discuss need for patient's community Medicaid  be changed to special assistance Medicaid for placement purposes. . Discussed plans with patient for ongoing care management follow up and provided patient with direct contact information for care management team . Confirmed receipt of the [facility] and that the following memory care facilities were contacted on patient's behalf to discuss bed availabilities: . [facility]: [phone] Left message . [facility]: [phone] Left message . [facility] [phone] No Medicaid Beds . [facility]-[phone] Left message No Medicaid Beds Available . [facility]:[phone] do not accept Medicaid . [facility]: [phone] left message . [facility]: [phone] (beds available, will fax FL2 to [facility] [phone]) . [facility]:[phone] No  availability . [facility] [phone] Bed available   Patient Self Care Activities:  . Attends all scheduled provider appointments  .    Plan:  . Patient's daughter will contact patient's medicaid worker to discuss transition from community medicaid to special assistance Medicaid for placement purposes. . This social worker will fax patient's FL2 to [facility] for review and possible bed offer-attention [name] [phone]  Please see past updates related to this goal by clicking on the "Past Updates" button in the selected goal          The patient verbalized understanding of instructions provided today and declined a print copy of patient instruction materials.   The CM team will reach out to the patient's daughter again over the next 2 weeks days.

## 2018-12-04 NOTE — Chronic Care Management (AMB) (Addendum)
  Chronic Care Management    Clinical Social Work Follow Up Note  12/04/2018 Name: Erica Stevens MRN: [id number] DOB: [birth year]  Erica Stevens is a 68 y.o. year old female who is a primary care patient of Erica Cory, MD. The CCM team was consulted for assistance with Level of Care Concerns  Review of patient status, including review of consultants reports, other relevant assessments, and collaboration with appropriate care team members and the patient's provider was performed as part of comprehensive patient evaluation and provision of chronic care management services.     Goals Addressed            This Visit's Progress   . "per daughter, I need help placing mom in a facility" (pt-stated)       Phone call today to patient's daughter to follow on facility care options. Per patient's daughter, patient condition continues to progress as se continues to require reminders to complete her ADL's. Patient's daughter informed of facilities contacted and possible offers. It was discussed that options were limited in [place] [place] with possibilities in neighboring counties. Per patient's daughter, she would like patient to remain in [facility] for easier access.     Current Barriers:  . Limited social support . Level of care concerns . ADL IADL limitations . Memory Deficits . Inability to perform ADL's independently . Inability to perform IADL's independently  Clinical Social Work Clinical Goal(s):  Marland Kitchen Over the next 30 days, client will work with SW to address concerns related to placement in facility care  Interventions: . Continued to encourage patient's daughter to contact patient's Medicaid worker to discuss need for patient's community Medicaid  be changed to special assistance Medicaid for placement purposes. . Discussed plans with patient for ongoing care management follow up and provided patient with direct contact information for care management team . Confirmed  receipt of the [facility] and that the following memory care facilities were contacted on patient's behalf to discuss bed availabilities: . [facility]: [phone] Left message . [facility]: [phone] Left message . [facility] [phone] No Medicaid Beds . [facility]-[phone] Left message No Medicaid Beds Available . [phone] do not accept Medicaid . [facility]: [phone] left message . [facility]: [phone] (beds available, will fax FL2 to [facility] [phone]) . [phone] No availability . [facility] [phone] Bed available   Patient Self Care Activities:  . Attends all scheduled provider appointments  .    Plan:  . Patient's daughter will contact patient's medicaid worker to discuss transition from community medicaid to special assistance Medicaid for placement purposes. . This Child psychotherapist will fax patient's FL2 to [facility] for review and possible bed offer-attention Erica Stevens [phone]  Please see past updates related to this goal by clicking on the "Past Updates" button in the selected goal          Follow Up Plan: SW will follow up with patient's daughter by phone over the next 2 weeks   Alesana Magistro, LCSW Clinical Social Worker  [facility] Care Management [phone]

## 2018-12-05 ENCOUNTER — Emergency Department: Payer: Medicare HMO

## 2018-12-05 ENCOUNTER — Emergency Department
Admission: EM | Admit: 2018-12-05 | Discharge: 2018-12-05 | Disposition: A | Discharge status: Home / Self Care | Payer: Medicare HMO | Attending: Internal Medicine | Admitting: Internal Medicine

## 2018-12-05 ENCOUNTER — Other Ambulatory Visit: Payer: Self-pay

## 2018-12-05 ENCOUNTER — Encounter: Payer: Self-pay | Admitting: Emergency Medicine

## 2018-12-05 DIAGNOSIS — I110 Hypertensive heart disease with heart failure: Secondary | ICD-10-CM | POA: Insufficient documentation

## 2018-12-05 DIAGNOSIS — N289 Disorder of kidney and ureter, unspecified: Secondary | ICD-10-CM

## 2018-12-05 DIAGNOSIS — F419 Anxiety disorder, unspecified: Secondary | ICD-10-CM | POA: Insufficient documentation

## 2018-12-05 DIAGNOSIS — I2510 Atherosclerotic heart disease of native coronary artery without angina pectoris: Secondary | ICD-10-CM | POA: Diagnosis not present

## 2018-12-05 DIAGNOSIS — R531 Weakness: Secondary | ICD-10-CM | POA: Insufficient documentation

## 2018-12-05 DIAGNOSIS — F17210 Nicotine dependence, cigarettes, uncomplicated: Secondary | ICD-10-CM | POA: Diagnosis not present

## 2018-12-05 DIAGNOSIS — I509 Heart failure, unspecified: Secondary | ICD-10-CM | POA: Diagnosis not present

## 2018-12-05 LAB — URINALYSIS, COMPLETE (UACMP) WITH MICROSCOPIC
Bacteria, UA: NONE SEEN
Bilirubin Urine: NEGATIVE
GLUCOSE, UA: NEGATIVE mg/dL
HGB URINE DIPSTICK: NEGATIVE
Ketones, ur: NEGATIVE mg/dL
Leukocytes,Ua: NEGATIVE
Nitrite: NEGATIVE
Protein, ur: NEGATIVE mg/dL
SPECIFIC GRAVITY, URINE: 1.013 (ref 1.005–1.030)
pH: 6 (ref 5.0–8.0)

## 2018-12-05 LAB — COMPREHENSIVE METABOLIC PANEL
ALT: 11 U/L (ref 0–44)
AST: 21 U/L (ref 15–41)
Albumin: 4 g/dL (ref 3.5–5.0)
Alkaline Phosphatase: 65 U/L (ref 38–126)
Anion gap: 9 (ref 5–15)
BILIRUBIN TOTAL: 0.5 mg/dL (ref 0.3–1.2)
BUN: 17 mg/dL (ref 8–23)
CALCIUM: 9.4 mg/dL (ref 8.9–10.3)
CO2: 28 mmol/L (ref 22–32)
CREATININE: 1.05 mg/dL — AB (ref 0.44–1.00)
Chloride: 103 mmol/L (ref 98–111)
GFR calc Af Amer: >60 mL/min (ref >60)
GFR, EST NON AFRICAN AMERICAN: 55 mL/min — AB (ref >60)
Glucose, Bld: 92 mg/dL (ref 70–99)
POTASSIUM: 4 mmol/L (ref 3.5–5.1)
Sodium: 140 mmol/L (ref 135–145)
Total Protein: 8.1 g/dL (ref 6.5–8.1)

## 2018-12-05 LAB — CBC WITH DIFFERENTIAL/PLATELET
Abs Immature Granulocytes: 0.03 10*3/uL (ref 0.00–0.07)
Basophils Absolute: 0 10*3/uL (ref 0.0–0.1)
Basophils Relative: 0 %
EOS ABS: 0.2 10*3/uL (ref 0.0–0.5)
Eosinophils Relative: 3 %
HEMATOCRIT: 43.5 % (ref 36.0–46.0)
Hemoglobin: 14.3 g/dL (ref 12.0–15.0)
IMMATURE GRANULOCYTES: 0 %
LYMPHS ABS: 2.6 10*3/uL (ref 0.7–4.0)
Lymphocytes Relative: 26 %
MCH: 31.7 pg (ref 26.0–34.0)
MCHC: 32.9 g/dL (ref 30.0–36.0)
MCV: 96.5 fL (ref 80.0–100.0)
MONOS PCT: 6 %
Monocytes Absolute: 0.6 10*3/uL (ref 0.1–1.0)
NEUTROS PCT: 65 %
Neutro Abs: 6.3 10*3/uL (ref 1.7–7.7)
Platelets: 240 10*3/uL (ref 150–400)
RBC: 4.51 MIL/uL (ref 3.87–5.11)
RDW: 12.6 % (ref 11.5–15.5)
WBC: 9.7 10*3/uL (ref 4.0–10.5)
nRBC: 0 % (ref 0.0–0.2)

## 2018-12-05 LAB — TROPONIN I: Troponin I: <0.03 ng/mL (ref <0.03)

## 2018-12-05 NOTE — ED Triage Notes (Signed)
Patient reports feeling weak this morning. States she fell because a her legs gave out. Patient denies any pain from fall. Patient reports continued generalized weakness. History of stroke. Patient has history of dementia. Here with daughter.

## 2018-12-05 NOTE — ED Notes (Signed)
Pt alert  Family with pt.  Sinus brady on monitor.

## 2018-12-05 NOTE — ED Provider Notes (Signed)
Patient was signed out to me by Dr. Daryel November.  68 year old female who presented to the emergency department for fall.  Here her work-up has been reassuring without any evidence of UTI, anemia, significant electrolyte abnormality, or injury to the head based on a negative CT scan.  She does have a creatinine of 1.05 today and I have asked her to make sure she drinks plenty of fluids stay well-hydrated.  In addition, her blood pressure is 205/75 but she is asymptomatic.  Her daughter states that she did not take any of her morning antihypertensives because the sick fall occurred right before she takes her medications.  I have asked her to take her medication as soon as she returns home.  At this time the patient is safe for discharge home and I have spoken to her directly and she has no additional questions.  Follow-up instructions as well as return precautions were discussed   Rockne Menghini, MD 12/05/18 1759

## 2018-12-05 NOTE — Discharge Instructions (Signed)
Your kidney function was slightly abnormal; this may be the sign of dehydration so make sure you are drinking plenty of fluid to stay well-hydrated.  Please return to the emergency department if you develop severe pain, lightheadedness or fainting, fever, or any other symptoms concerning to you.

## 2018-12-05 NOTE — ED Provider Notes (Signed)
[facility] Emergency Department Provider Note       Time seen: ----------------------------------------- 2:10 PM on 12/05/2018 -----------------------------------------   I have reviewed the triage vital signs and the nursing notes.  HISTORY   Chief Complaint Weakness and Fall    HPI Erica Stevens is a 68 y.o. female with a history of anxiety, chronic pain, CHF, depression, hypertension, migraines, dementia who presents to the ED for weakness that began this morning.  Patient states she fell because her legs gave out, denies any pain from the fall.  She reports continued generalized weakness with a history of CVA.  Past Medical History:  Diagnosis Date  . Allergic rhinitis due to pollen   . Anxiety   . Brachial neuritis or radiculitis NOS   . Cellulitis and abscess of unspecified site   . Chronic kidney disease, stage I   . Chronic pain syndrome   . Congestive heart failure, unspecified   . Coronary atherosclerosis of unspecified type of vessel, native or graft   . Degeneration of cervical intervertebral disc   . Depression   . Hypertension   . Migraine, unspecified, without mention of intractable migraine without mention of status migrainosus   . 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 Osteoporosis, unspecified   . Pure hypercholesterolemia   . Reflux esophagitis   . Thoracic or lumbosacral neuritis or radiculitis, unspecified   . Unspecified urinary incontinence     Patient Active Problem List   Diagnosis Date Noted  . History of drug dependence/abuse (HCC) 08/01/2018  . Chronic systolic congestive heart failure (HCC) 08/01/2018  . Atherosclerosis of aorta (HCC) 05/22/2017  . Fracture of thoracic spine (HCC) 05/22/2017  . Slurred speech 03/01/2017  . Opiate overdose (HCC) 01/13/2017  . Cocaine abuse (HCC) 01/13/2017  . Benzodiazepine abuse (HCC) 01/13/2017  . Heroin abuse (HCC) 05/03/2016  . Anoxia 05/03/2016  . Moderate dementia with  behavioral disturbance (HCC) 05/03/2016  . Accidental heroin overdose (HCC) 05/03/2016  . Severe recurrent major depression (HCC) 05/01/2016  . Angina pectoris (HCC) 02/24/2016  . Cervical disc disease 10/02/2015  . Affective disorder, major 07/02/2015  . Benign hypertensive heart disease 03/09/2015  . Migraine 03/09/2015  . Billowing mitral valve 03/09/2015  . Osteoporosis 03/09/2015  . Urge incontinence 03/09/2015  . Chest pressure 06/21/2013  . Leg edema 06/21/2013  . Shortness of breath 06/21/2013  . CAD (coronary artery disease) of artery bypass graft 06/21/2013  . Hyperlipidemia 06/21/2013  . Chronic pain 03/25/2009  . Hypercholesterolemia without hypertriglyceridemia 05/22/2007  . Coronary atherosclerosis 05/22/2007  . 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 Brachial neuritis 12/29/2006    Past Surgical History:  Procedure Laterality Date  . ABDOMINAL HYSTERECTOMY    . BLADDER SURGERY    . BYPASS GRAFT    . CARDIAC CATHETERIZATION  2014   [facility]  . CHOLECYSTECTOMY    . COLONOSCOPY    . CORONARY ARTERY BYPASS GRAFT     x3 grafts;[facility]  . fasciotomy      Allergies Erythromycin; Ivp dye [iodinated diagnostic agents]; Sulfa antibiotics; and Sulfur  Social History Social History   Tobacco Use  . Smoking status: Current Every Day Smoker    Packs/day: 1.50    Types: Cigarettes  . Smokeless tobacco: Never Used  Substance Use Topics  . Alcohol use: No  . Drug use: Yes    Types: Cocaine, Heroin, Hydrocodone, Benzodiazepines    Comment: quit 04-2017   Review of Systems Constitutional: Negative for fever. Cardiovascular: Negative for chest pain.  Respiratory: Negative for shortness of breath. Gastrointestinal: Negative for abdominal pain, vomiting and diarrhea. Musculoskeletal: Negative for back pain. Skin: Negative for rash. Neurological: Positive for generalized weakness  All systems negative/normal/unremarkable except as stated in the  HPI  ____________________________________________   PHYSICAL EXAM:  VITAL SIGNS: ED Triage Vitals  Enc Vitals Group     BP 12/05/18 1406 (!) 202/86     Pulse Rate 12/05/18 1406 (!) 52     Resp 12/05/18 1406 16     Temp 12/05/18 1406 (!) 97.5 F (36.4 C)     Temp src --      SpO2 12/05/18 1406 98 %     Weight 12/05/18 1407 125 lb (56.7 kg)     Height 12/05/18 1407 5' (1.524 m)     Head Circumference --      Peak Flow --      Pain Score 12/05/18 1407 0     Pain Loc --      Pain Edu? --      Excl. in GC? --     Constitutional: Alert, well appearing and in no distress. Eyes: Conjunctivae are normal. Normal extraocular movements. ENT      Head: Normocephalic and atraumatic.      Nose: No congestion/rhinnorhea.      Mouth/Throat: Mucous membranes are moist.      Neck: No stridor. Cardiovascular: Normal rate, regular rhythm. No murmurs, rubs, or gallops. Respiratory: Normal respiratory effort without tachypnea nor retractions. Breath sounds are clear and equal bilaterally. No wheezes/rales/rhonchi. Gastrointestinal: Soft and nontender. Normal bowel sounds Musculoskeletal: Nontender with normal range of motion in extremities. No lower extremity tenderness nor edema. Neurologic:  Normal speech and language. No gross focal neurologic deficits are appreciated.  Generalized weakness, nothing focal, walks with a shuffling gait Skin:  Skin is warm, dry and intact. No rash noted. Psychiatric: Mood and affect are normal. Speech and behavior are normal.  ____________________________________________  EKG: Interpreted by me.  Sinus rhythm the rate of 51 bpm, LVH, normal QT.  ____________________________________________  ED COURSE:  As part of my medical decision making, I reviewed the following data within the electronic MEDICAL RECORD NUMBER History obtained from family if available, nursing notes, old chart and ekg, as well as notes from prior ED visits. Patient presented for generalized  weakness, we will assess with labs and imaging as indicated at this time.   Procedures ____________________________________________   LABS (pertinent positives/negatives)  Labs Reviewed  COMPREHENSIVE METABOLIC PANEL - Abnormal; Notable for the following components:      Result Value   Creatinine, Ser 1.05 (*)    GFR calc non Af Amer 55 (*)    All other components within normal limits  CBC WITH DIFFERENTIAL/PLATELET  TROPONIN I  URINALYSIS, COMPLETE (UACMP) WITH MICROSCOPIC  CBG MONITORING, ED    RADIOLOGY Images were viewed by me  CT head IMPRESSION: Atrophy with chronic right basal ganglial lacunar infarct and microvascular ischemic disease. No acute intracranial abnormality.  ____________________________________________   DIFFERENTIAL DIAGNOSIS   Dehydration, electrolyte abnormality, occult infection, CVA, TIA, dementia  FINAL ASSESSMENT AND PLAN  Weakness   Plan: The patient had presented for generalized weakness. Patient's labs to this point have been unremarkable, urinalysis is pending. Patient's imaging was negative regarding CT imaging, there is a chronic right basal ganglia infarct.   Ulice Dash, MD    Note: This note was generated in part or whole with voice recognition software. Voice recognition is usually quite accurate but there  are transcription errors that can and very often do occur. I apologize for any typographical errors that were not detected and corrected.     Emily Filbert, MD 12/05/18 [phone]

## 2018-12-06 ENCOUNTER — Ambulatory Visit: Payer: Self-pay | Admitting: *Deleted

## 2018-12-06 ENCOUNTER — Telehealth: Payer: Self-pay

## 2018-12-06 DIAGNOSIS — F332 Major depressive disorder, recurrent severe without psychotic features: Secondary | ICD-10-CM

## 2018-12-06 DIAGNOSIS — F0391 Unspecified dementia with behavioral disturbance: Secondary | ICD-10-CM | POA: Diagnosis not present

## 2018-12-06 DIAGNOSIS — F03B18 Unspecified dementia, moderate, with other behavioral disturbance: Secondary | ICD-10-CM

## 2018-12-06 DIAGNOSIS — I5022 Chronic systolic (congestive) heart failure: Secondary | ICD-10-CM | POA: Diagnosis not present

## 2018-12-06 NOTE — Chronic Care Management (AMB) (Signed)
  Chronic Care Management    Clinical Social Work Follow Up Note  12/06/2018 Name: Erica Stevens MRN: [id number] DOB: 20-May-1951  Erica Stevens is a 68 y.o. year old female who is a primary care patient of Erica Cory, MD. The CCM team was consulted for assistance with Level of Care Concerns   Review of patient status, including review of consultants reports, other relevant assessments, and collaboration with appropriate care team members and the patient's provider was performed as part of comprehensive patient evaluation and provision of chronic care management services.     Goals Addressed            This Visit's Progress   . "per daughter, I need help placing mom in a facility" (pt-stated)       Return phone call from [facility] and [facility] assisted living stating that they had no Medicaid beds at this time.  Current Barriers:  . Limited social support . Level of care concerns . ADL IADL limitations . Memory Deficits . Inability to perform ADL's independently . Inability to perform IADL's independently  Clinical Social Work Clinical Goal(s):  Marland Kitchen Over the next 30 days, client will work with SW to address concerns related to placement in facility care  Interventions: . Continued to encourage patient's daughter to contact patient's Medicaid worker to discuss need for patient's community Medicaid  be changed to special assistance Medicaid for placement purposes. . Discussed plans with patient for ongoing care management follow up and provided patient with direct contact information for care management team . Confirmed receipt of the [facility] and that the following memory care facilities were contacted on patient's behalf to discuss bed availabilities: . [facility]: [phone] returned call on 12/06/18 and reported that they had no Medicaid beds available. [facility]: [phone] Left message . [facility] [phone] No Medicaid Beds . [phone] Left message No Medicaid Beds Available . [facility]:[phone] do not accept Medicaid . [facility]: [phone] returned call on 12/06/18 and reported that they had no Medicaid beds available . [facility]: [phone] (beds available, will fax FL2 to [facility] [phone]) . [facility]:[phone] No availability . [facility] [phone] Bed available   Patient Self Care Activities:  . Attends all scheduled provider appointments  .    Plan:  . Patient's daughter will contact patient's medicaid worker to discuss transition from community medicaid to special assistance Medicaid for placement purposes. . This social worker will follow up on  [facility] faxed to [facility] for review and possible bed offer-attention [name] [phone]  Please see past updates related to this goal by clicking on the "Past Updates" button in the selected goal          Follow Up Plan: This Child psychotherapist to follow up on patient's placement options within 2 weeks.   Erica Czech, LCSW Clinical Social Ecologist [facility] Care Management [phone]

## 2018-12-14 ENCOUNTER — Telehealth: Payer: Self-pay

## 2018-12-14 ENCOUNTER — Ambulatory Visit: Payer: Self-pay | Admitting: *Deleted

## 2018-12-14 DIAGNOSIS — F03B18 Unspecified dementia, moderate, with other behavioral disturbance: Secondary | ICD-10-CM

## 2018-12-14 DIAGNOSIS — F1921 Other psychoactive substance dependence, in remission: Secondary | ICD-10-CM

## 2018-12-14 DIAGNOSIS — F0391 Unspecified dementia with behavioral disturbance: Secondary | ICD-10-CM

## 2018-12-14 NOTE — Chronic Care Management (AMB) (Signed)
  Chronic Care Management   Social Work Note  12/14/2018 Name: Makaya Lascala MRN: [id number] DOB: [birth year]  Chantia Grates is a 68 y.o. year old female who sees [name], MD for primary care. The CCM team was consulted for assistance with Level of Care Concerns.   Phone call to admissions coordinator, [name] from [facility] to confirm that she has received the [facility] faxed to her. Per [name], she did not receive this form and requested that it be faxed again. This Child psychotherapist confirmed the fax number and made several attempts to re-fax the FL2 but it would not go through successfully.    Goals Addressed   None     Follow Up Plan: This social worker will try again to fax the form on Monday, 12/17/18 and will follow up with patient's daughter once there is a confirmation that the fax was received.  Verna Czech, LCSW Clinical Social Ecologist [facility] Care Management [phone]

## 2018-12-18 ENCOUNTER — Other Ambulatory Visit: Payer: Self-pay

## 2018-12-18 ENCOUNTER — Telehealth: Payer: Self-pay | Admitting: *Deleted

## 2018-12-18 ENCOUNTER — Ambulatory Visit: Payer: Self-pay | Admitting: *Deleted

## 2018-12-18 DIAGNOSIS — F0391 Unspecified dementia with behavioral disturbance: Secondary | ICD-10-CM

## 2018-12-18 DIAGNOSIS — F1921 Other psychoactive substance dependence, in remission: Secondary | ICD-10-CM

## 2018-12-18 DIAGNOSIS — N3941 Urge incontinence: Secondary | ICD-10-CM

## 2018-12-18 DIAGNOSIS — F03B18 Unspecified dementia, moderate, with other behavioral disturbance: Secondary | ICD-10-CM

## 2018-12-18 NOTE — Patient Instructions (Signed)
Thank you allowing the Chronic Care Management Team to be a part of your care! It was a pleasure speaking with you today!  1. Please follow up with patient's Medicaid worker regarding special assistance medicaid.   CCM (Chronic Care Management) Team   Yvone Neu RN, BSN Nurse Care Coordinator  [phone]  Karalee Height PharmD  Clinical Pharmacist  [phone]   Verna Czech, LCSW Clinical Social Worker [phone]  Goals Addressed            This Visit's Progress   . "per daughter, I need help placing mom in a facility" (pt-stated)       Current Barriers:  . Limited social support . Level of care concerns . ADL IADL limitations . Memory Deficits . Inability to perform ADL's independently . Inability to perform IADL's independently  Clinical Social Work Clinical Goal(s):  Marland Kitchen Over the next 30 days, client will work with SW to address concerns related to placement in facility care  Interventions: . Continued to encourage patient's daughter to contact patient's Medicaid worker to discuss need for patient's community Medicaid  be changed to special assistance Medicaid for placement purposes. . Discussed plans with patient for ongoing care management follow up and provided patient with direct contact information for care management team . Informed patient's daughter today that [name] at [place] has received the [facility] and it is in review-patient will need at TB test before she is admitted. Eligibility discussed as well as admissions criteria. . Patient's current medical condition discussed as well as need for placement as soon as possible.   Patient Self Care Activities:  . Attends all scheduled provider appointments  .    Plan:  . Patient's daughter will contact patient's medicaid worker to discuss transition from community medicaid to special assistance Medicaid for placement purposes. . This Child psychotherapist will continue to follow up with [facility] for  review and possible bed offer  Please see past updates related to this goal by clicking on the "Past Updates" button in the selected goal          The patient verbalized understanding of instructions provided today and declined a print copy of patient instruction materials.   The CM team will reach out to the patient again over the next 14 days.

## 2018-12-18 NOTE — Chronic Care Management (AMB) (Signed)
  Chronic Care Management    Clinical Social Work Follow Up Note  12/18/2018 Name: Erica Stevens MRN: [id number] DOB: 1951-05-25  Erica Stevens is a 68 y.o. year old female who is a primary care patient of Erica Cory, MD. The CCM team was consulted for assistance with Level of Care Concerns.   Phone call to intake director at [facility] who confirmed that she has received the FL2 and a bed offer is pending review. Patient will need to apply for special assistance Medicaid through the Department of Social Services.  Review of patient status, including review of consultants reports, other relevant assessments, and collaboration with appropriate care team members and the patient's provider was performed as part of comprehensive patient evaluation and provision of chronic care management services.     Goals Addressed            This Visit's Progress   . "per daughter, I need help placing mom in a facility" (pt-stated)       Current Barriers:  . Limited social support . Level of care concerns . ADL IADL limitations . Memory Deficits . Inability to perform ADL's independently . Inability to perform IADL's independently  Clinical Social Work Clinical Goal(s):  Marland Kitchen Over the next 30 days, client will work with SW to address concerns related to placement in facility care  Interventions: . Continued to encourage patient's daughter to contact patient's Medicaid worker to discuss need for patient's community Medicaid  be changed to special assistance Medicaid for placement purposes. . Discussed plans with patient for ongoing care management follow up and provided patient with direct contact information for care management team . Confirmed receipt of the [facility] and that the following memory care facilities were contacted on patient's behalf to discuss bed availabilities: . [facility]: [phone] returned call on 12/06/18 and reported that they had no Medicaid beds available. [facility]: [phone] Left message . [facility] [phone] No Medicaid Beds . [facility]-[phone] Left message No Medicaid Beds Available . [facility]:[phone] do not accept Medicaid . [place]: [phone] returned call on 12/06/18 and reported that they had no Medicaid beds available . [facility]: [phone] (beds available, will fax FL2 to [name] [phone]) 12/18/18 confirmed today with [name] at [place] that the [facility] has been received and in review-patient will need at TB test before she is admitted. Eligibility discussed as well as admissions criteria. . [phone] No availability . [facility] [phone] Bed available   Patient Self Care Activities:  . Attends all scheduled provider appointments  .    Plan:  . Patient's daughter will contact patient's medicaid worker to discuss transition from community medicaid to special assistance Medicaid for placement purposes. . This Child psychotherapist will continue to follow up with [facility] for review and possible bed offer-attention [name] [phone]  Please see past updates related to this goal by clicking on the "Past Updates" button in the selected goal          Follow Up Plan: SW will follow up with patient by phone over the next 7 days regarding possible bed offer at [facility] and requirements that need to take place before she is admitted.   Verna Czech, LCSW Clinical Social Ecologist [facility] Care Management [phone]

## 2018-12-18 NOTE — Chronic Care Management (AMB) (Signed)
  Chronic Care Management    Clinical Social Work Follow Up Note  12/18/2018 Name: Erica Stevens MRN: [id number] DOB: 09/10/1951  Erica Stevens is a 68 y.o. year old female who is a primary care patient of [name], MD. The CCM team was consulted for assistance with Level of Care Concerns.   This Child psychotherapist spoke with patient's daughter today who stated that patient has had 2 falls, one of which required a emergency room visit. Patient is now incontinent with urine and bowels. Per patient's daughter her ability is alos becoming more limited and she is feeling that she is in real need for placement at this time.  Review of patient status, including review of consultants reports, other relevant assessments, and collaboration with appropriate care team members and the patient's provider was performed as part of comprehensive patient evaluation and provision of chronic care management services.     Goals Addressed            This Visit's Progress   . "per daughter, I need help placing mom in a facility" (pt-stated)       Current Barriers:  . Limited social support . Level of care concerns . ADL IADL limitations . Memory Deficits . Inability to perform ADL's independently . Inability to perform IADL's independently  Clinical Social Work Clinical Goal(s):  Marland Kitchen Over the next 30 days, client will work with SW to address concerns related to placement in facility care  Interventions: . Continued to encourage patient's daughter to contact patient's Medicaid worker to discuss need for patient's community Medicaid  be changed to special assistance Medicaid for placement purposes. . Discussed plans with patient for ongoing care management follow up and provided patient with direct contact information for care management team . Informed patient's daughter today that [name] at [place] has received the [facility] and it is in review-patient will need at TB test before she is admitted.  Eligibility discussed as well as admissions criteria. . Patient's current medical condition discussed as well as need for placement as soon as possible.   Patient Self Care Activities:  . Attends all scheduled provider appointments  .    Plan:  . Patient's daughter will contact patient's medicaid worker to discuss transition from community medicaid to special assistance Medicaid for placement purposes. . This Child psychotherapist will continue to follow up with [facility] for review and possible bed offer  Please see past updates related to this goal by clicking on the "Past Updates" button in the selected goal          Follow Up Plan: SW will follow up with patient by phone over the next 2 weeks regarding placement options.   Verna Czech, LCSW Clinical Social Ecologist [facility] Care Management [phone]

## 2018-12-19 ENCOUNTER — Ambulatory Visit: Payer: Self-pay | Admitting: *Deleted

## 2018-12-19 DIAGNOSIS — R600 Localized edema: Secondary | ICD-10-CM

## 2018-12-19 DIAGNOSIS — F0391 Unspecified dementia with behavioral disturbance: Secondary | ICD-10-CM

## 2018-12-19 DIAGNOSIS — F03B18 Unspecified dementia, moderate, with other behavioral disturbance: Secondary | ICD-10-CM

## 2018-12-19 NOTE — Chronic Care Management (AMB) (Signed)
  Chronic Care Management   Social Work Note  12/19/2018 Name: Erica Stevens MRN: [id number] DOB: April 08, 1951  Erica Stevens is a 68 y.o. year old female who sees [name], MD for primary care. The CCM team was consulted for assistance with Level of Care Concerns.   Late-Entry-Follow up phone call intake coordinator [name] at [place] regarding placement for patient on 12/18/2018. Per [name], she has availability however is working with her leadership team on how to go about assessing patient for entry. She will call this Child psychotherapist as soon as this information is obtained.  Goals Addressed   None     Follow Up Plan: SW will follow up with [facility]  by phone regarding potential bed offer over the next 7 days  Chrystal Land, LCSW Clinical Social Worker  [facility] Care Management [phone]

## 2018-12-24 ENCOUNTER — Ambulatory Visit: Payer: Self-pay | Admitting: *Deleted

## 2018-12-24 DIAGNOSIS — F332 Major depressive disorder, recurrent severe without psychotic features: Secondary | ICD-10-CM

## 2018-12-24 DIAGNOSIS — F03B18 Unspecified dementia, moderate, with other behavioral disturbance: Secondary | ICD-10-CM

## 2018-12-24 DIAGNOSIS — F0391 Unspecified dementia with behavioral disturbance: Secondary | ICD-10-CM

## 2018-12-24 NOTE — Chronic Care Management (AMB) (Signed)
  Chronic Care Management    Clinical Social Work Follow Up Note  12/24/2018 Name: Erica Stevens MRN: [id number] DOB: 1951/05/08  Erica Stevens is a 68 y.o. year old female who is a primary care patient of [name], MD. The CCM team was consulted for assistance with Level of Care Concerns.   Phone call received by patient's daughter on 12/21/18 requesting an update on patient's placement. Update provided as well as contact number for the intake coordinator at [facility]. [phone] Patient's daughter encouraged to call.    Review of patient status, including review of consultants reports, other relevant assessments, and collaboration with appropriate care team members and the patient's provider was performed as part of comprehensive patient evaluation and provision of chronic care management services.     Goals Addressed            This Visit's Progress   . "per daughter, I need help placing mom in a facility" (pt-stated)       Current Barriers:  . Limited social support . Level of care concerns . ADL IADL limitations . Memory Deficits . Inability to perform ADL's independently . Inability to perform IADL's independently  Clinical Social Work Clinical Goal(s):  Marland Kitchen Over the next 30 days, client will work with SW to address concerns related to placement in facility care  Interventions: . Discussed plans with patient for ongoing care management follow up and provided patient with direct contact information for care management team . Informed patient's daughter today that [name] at [facility] has received the [facility] and it is in review-patient will need at TB test before she is admitted. Eligibility discussed as well as admissions criteria. Phone number provided to patient's daughter to follow up with [name] intake coordinator at [facility] to discuss placement. [phone] . Patient's current medical condition discussed as well as need for placement as soon as possible.    Patient Self Care Activities:  . Attends all scheduled provider appointments  .    Plan:  . Patient's daughter will contact patient's medicaid worker to discuss transition from community medicaid to special assistance Medicaid for placement purposes. . This Child psychotherapist will continue to follow up with [facility] for review and possible bed offer  Please see past updates related to this goal by clicking on the "Past Updates" button in the selected goal          Follow Up Plan: SW will follow up with patient by phone over the next 7 days   SIGNATURE

## 2018-12-25 ENCOUNTER — Ambulatory Visit: Payer: Self-pay | Admitting: *Deleted

## 2018-12-25 DIAGNOSIS — F0391 Unspecified dementia with behavioral disturbance: Secondary | ICD-10-CM

## 2018-12-25 DIAGNOSIS — F03B18 Unspecified dementia, moderate, with other behavioral disturbance: Secondary | ICD-10-CM

## 2018-12-25 DIAGNOSIS — F332 Major depressive disorder, recurrent severe without psychotic features: Secondary | ICD-10-CM

## 2018-12-25 NOTE — Chronic Care Management (AMB) (Signed)
  Chronic Care Management   Social Work Note  12/25/2018 Name: Erica Stevens MRN: [id number] DOB: [birth year]  Heaven Enriques is a 68 y.o. year old female who sees [name], MD for primary care. The CCM team was consulted for assistance with Level of Care Concerns.   Phone call to [name], intake coordinator at [facility] to follow up on possible bed offer for patient. Voicemail message left for a return call.  Goals Addressed   None     Follow Up Plan: SW will follow up with patient's daughter and [facility] by phone within the next 7 days  Richa Shor, [place] Clinical Social Worker  [facility] Care Management [phone]

## 2018-12-27 ENCOUNTER — Telehealth: Payer: Self-pay | Admitting: *Deleted

## 2018-12-27 ENCOUNTER — Ambulatory Visit: Payer: Self-pay | Admitting: *Deleted

## 2018-12-27 DIAGNOSIS — R413 Other amnesia: Secondary | ICD-10-CM

## 2018-12-27 DIAGNOSIS — F0391 Unspecified dementia with behavioral disturbance: Secondary | ICD-10-CM

## 2018-12-27 DIAGNOSIS — F03B18 Unspecified dementia, moderate, with other behavioral disturbance: Secondary | ICD-10-CM

## 2018-12-27 NOTE — Chronic Care Management (AMB) (Signed)
  Chronic Care Management    Clinical Social Work Follow Up Note  12/27/2018 Name: Erica Stevens MRN: [id number] DOB: 1951-04-06  Erica Stevens is a 68 y.o. year old female who is a primary care patient of [name], MD. The CCM team was consulted for assistance with Level of Care Concerns.   Review of patient status, including review of consultants reports, other relevant assessments, and collaboration with appropriate care team members and the patient's provider was performed as part of comprehensive patient evaluation and provision of chronic care management services.     Goals Addressed            This Visit's Progress   . "per daughter, I need help placing mom in a facility" (pt-stated)       This social worker spoke with patient's daughter Erica Stevens today to obtain an update on patient's placement in a facility. Per patient's daughter, she has left a message for [name] at [facility], however she has not returned the call. Patient's condition continues to progress and patient's daughter would like her placed as soon as possible. Patient's daughter did speak with the Medicaid office who will plan to send her a consent form to provide updates and apply for long term care medicaid on her behalf.   Current Barriers:  . Limited social support . Level of care concerns . ADL IADL limitations . Memory Deficits . Inability to perform ADL's independently . Inability to perform IADL's independently  Clinical Social Work Clinical Goal(s):  Marland Kitchen Over the next 30 days, client will work with SW to address concerns related to placement in facility care  Interventions: . Discussed plans with patient for ongoing care management follow up and provided patient with direct contact information for care management team .  Confirmed with patient's daughter that she did call [name] intake coordinator at [facility] to discuss placement. [phone]. She has left a message, however her call was  not returned. . Confirmed with patient's daughter that she has been in contact with the Department of Social Services. They are sending her a consent form to sign and return that will allow her to speak with the Medicaid staff there on her mother's behalf. . Patient's current medical condition discussed as well as need for placement as soon as possible.   Patient Self Care Activities:  . Attends all scheduled provider appointments   Plan:   . This Child psychotherapist will continue to follow up with [facility] for review and possible bed offer  Please see past updates related to this goal by clicking on the "Past Updates" button in the selected goal          Follow Up Plan: SW will follow up with patient by phone over the next week   Chrystal Land, LCSW Clinical Social Worker  [facility] Care Management [phone]

## 2018-12-31 ENCOUNTER — Ambulatory Visit: Payer: Self-pay | Admitting: *Deleted

## 2018-12-31 ENCOUNTER — Telehealth: Payer: Self-pay | Admitting: *Deleted

## 2018-12-31 ENCOUNTER — Ambulatory Visit (INDEPENDENT_AMBULATORY_CARE_PROVIDER_SITE_OTHER): Payer: Medicare HMO | Admitting: *Deleted

## 2018-12-31 DIAGNOSIS — F332 Major depressive disorder, recurrent severe without psychotic features: Secondary | ICD-10-CM

## 2018-12-31 DIAGNOSIS — R600 Localized edema: Secondary | ICD-10-CM

## 2018-12-31 DIAGNOSIS — F0391 Unspecified dementia with behavioral disturbance: Secondary | ICD-10-CM | POA: Diagnosis not present

## 2018-12-31 DIAGNOSIS — F03B18 Unspecified dementia, moderate, with other behavioral disturbance: Secondary | ICD-10-CM

## 2018-12-31 NOTE — Chronic Care Management (AMB) (Signed)
  Chronic Care Management   Social Work Note  12/31/2018 Name: Erica Stevens MRN: [id number] DOB: 09/27/1950  Erica Stevens is a 68 y.o. year old female who sees [name], MD for primary care. The CCM team was consulted for assistance with Level of Care Concerns.   Additional phone call to the [facility] main office [phone] to discuss difficulty in reaching the intake coordinator. This social worker was able to confirm that there was a bed available in their family care home, however patient would need to ambulatory using a cane or walker, no wheelchair.   Goals Addressed   None     Follow Up Plan: This social worker will follow up with patient's daughter to discuss alternate long term care options.  Verna Czech, LCSW [facility] [phone]

## 2018-12-31 NOTE — Patient Instructions (Signed)
Thank you allowing the Chronic Care Management Team to be a part of your care! It was a pleasure speaking with you today!  1. Please continue to engage with the CCM social worker for facility care options.    CCM (Chronic Care Management) Team   Yvone Neu RN, BSN Nurse Care Coordinator  [phone]  Karalee Height PharmD  Clinical Pharmacist  [phone]   Verna Czech, LCSW Clinical Social Worker [phone]  Goals Addressed            This Visit's Progress   . "per daughter, I need help placing mom in a facility" (pt-stated)       Current Barriers:  . Limited social support . Level of care concerns . ADL IADL limitations . Memory Deficits . Inability to perform ADL's independently . Inability to perform IADL's independently  Clinical Social Work Clinical Goal(s):  Marland Kitchen Over the next 30 days, client will work with SW to address concerns related to placement in facility care  Interventions: . Discussed plans with patient for ongoing care management follow up and provided patient with direct contact information for care management team .  Confirmed with patient's daughter that multiple efforts have been made to contact [name] intake coordinator at [facility] to discuss placement. [phone]. This social worker has left multiple  Messages, including call made to the front office, however there has bee no  Return call. . Patient's current medical condition discussed as well as alternative long term care options. . Patient's current medical condition discussed as well as need for placement as soon as possible.   Patient Self Care Activities:  . Attends all scheduled provider appointments  .    Plan:   . This Child psychotherapist will continue to follow up with [facility] for review and possible bed offer . This social will contact previously contacted ALF's to see if a bed has possibly opened up since last contact.  Please see past updates related to this goal  by clicking on the "Past Updates" button in the selected goal          The patient verbalized understanding of instructions provided today and declined a print copy of patient instruction materials.   The CM team will reach out to the patient's daughter  again over the next 14 days.

## 2018-12-31 NOTE — Chronic Care Management (AMB) (Signed)
  Chronic Care Management   Social Work Note  12/31/2018 Name: Bintou Horstmeyer MRN: [id number] DOB: 1950-11-12  Tekeya Koegler is a 68 y.o. year old female who sees [name], MD for primary care. The CCM team was consulted for assistance with Level of Care Concerns.   Phone call made today to Janiceadmissions coordinator at [facility] to discuss possible bed offer. Multiple attempts made to contact [name], however she has not returned the call.  Goals Addressed   None     Follow Up Plan: This Child psychotherapist to follow up with patient's daughter in 1 week to discuss alternative options for long term care.   Verna Czech, LCSW [facility] [phone]

## 2018-12-31 NOTE — Chronic Care Management (AMB) (Signed)
  Chronic Care Management    Clinical Social Work Follow Up Note  12/31/2018 Name: Erica Stevens MRN: [id number] DOB: September 17, 1950  Erica Stevens is a 68 y.o. year old female who is a primary care patient of [name], MD. The CCM team was consulted for assistance with Level of Care Concerns.   Review of patient status, including review of consultants reports, other relevant assessments, and collaboration with appropriate care team members and the patient's provider was performed as part of comprehensive patient evaluation and provision of chronic care management services.     Goals Addressed            This Visit's Progress   . "per daughter, I need help placing mom in a facility" (pt-stated)       This social worker spoke with patient's daughter to day to provide an update on the ALF placement. This Child psychotherapist explained that multiple attempts have been made to contact the intake coordinator at [facility] without success. This Child psychotherapist discussed explored patient's current condition to assess if patient's level of care needs have changed. Per patient's daughter, she ambulates around the house with her walker but uses a wheelchair when she goes out. Per patient's daughter, she is able to complete or own ADl's. Assisted living remains indicated at this time as her condition has not progressed to total care.  Current Barriers:  . Limited social support . Level of care concerns . ADL IADL limitations . Memory Deficits . Inability to perform ADL's independently . Inability to perform IADL's independently  Clinical Social Work Clinical Goal(s):  Marland Kitchen Over the next 30 days, client will work with SW to address concerns related to placement in facility care  Interventions: . Discussed plans with patient for ongoing care management follow up and provided patient with direct contact information for care management team .  Confirmed with patient's daughter that multiple efforts  have been made to contact [name] intake coordinator at [facility] to discuss placement. [phone]. This social worker has left multiple  Messages, including call made to the front office, however there has bee no  Return call. . Patient's current medical condition discussed as well as alternative long term care options. . Patient's current medical condition discussed as well as need for placement as soon as possible.   Patient Self Care Activities:  . Attends all scheduled provider appointments  .    Plan:   . This Child psychotherapist will continue to follow up with [facility] for review and possible bed offer . This social will contact previously contacted ALF's to see if a bed has possibly opened up since last contact.  Please see past updates related to this goal by clicking on the "Past Updates" button in the selected goal          Follow Up Plan: SW will follow up with patient's daughter by phone over the next 2 weeks in regards to alternate facility care options outside of [facility].   Verna Czech, LCSW [facility] [phone]

## 2019-01-01 ENCOUNTER — Telehealth: Payer: Self-pay

## 2019-01-03 ENCOUNTER — Telehealth: Payer: Self-pay

## 2019-01-04 ENCOUNTER — Ambulatory Visit: Payer: Self-pay | Admitting: *Deleted

## 2019-01-04 ENCOUNTER — Encounter: Payer: Self-pay | Admitting: *Deleted

## 2019-01-04 DIAGNOSIS — F332 Major depressive disorder, recurrent severe without psychotic features: Secondary | ICD-10-CM | POA: Diagnosis not present

## 2019-01-04 DIAGNOSIS — F0391 Unspecified dementia with behavioral disturbance: Secondary | ICD-10-CM | POA: Diagnosis not present

## 2019-01-04 DIAGNOSIS — F1921 Other psychoactive substance dependence, in remission: Secondary | ICD-10-CM

## 2019-01-04 DIAGNOSIS — F03B18 Unspecified dementia, moderate, with other behavioral disturbance: Secondary | ICD-10-CM

## 2019-01-04 NOTE — Chronic Care Management (AMB) (Signed)
  Chronic Care Management    Clinical Social Work Follow Up Note  01/04/2019 Name: Erica Stevens MRN: [id number] DOB: [birth year]  Erica Stevens is a 68 y.o. year old female who is a primary care patient of [name], MD. The CCM team was consulted for assistance with Level of Care Concerns.   Review of patient status, including review of consultants reports, other relevant assessments, and collaboration with appropriate care team members and the patient's provider was performed as part of comprehensive patient evaluation and provision of chronic care management services.     Goals Addressed            This Visit's Progress   . "per daughter, I need help placing mom in a facility" (pt-stated)       Current Barriers:  . Limited social support . Level of care concerns . ADL IADL limitations . Memory Deficits . Inability to perform ADL's independently . Inability to perform IADL's independently  Clinical Social Work Clinical Goal(s):  Marland Kitchen Over the next 30 days, client will work with SW to address concerns related to placement in facility care  Interventions: . Phone call to [facility] to confirm Medicaid bed availability. This social worker spoke with [place] who requested a review of the FL2 and any nursing notes before a bed can be offered. Patient will need a TB test and special assistance Medicaid. Marland Kitchen Fl2 and nursing notes to be faxed to [place] [phone]  Patient Self Care Activities:  . Attends all scheduled provider appointments  .    Plan:   . This Child psychotherapist will continue to follow up with [facility] for review and possible bed offer . This social will send Fl2 and nursing notes to [facility] today  Please see past updates related to this goal by clicking on the "Past Updates" button in the selected goal          Follow Up Plan: SW will follow up with patient's daughter by phone over the next 7 days regarding status of bed offer.    Verna Czech, LCSW [facility] [phone]

## 2019-01-04 NOTE — Progress Notes (Signed)
   Land, [place], [place] This encounter was created in error - please disregard.

## 2019-01-04 NOTE — Chronic Care Management (AMB) (Signed)
  Chronic Care Management   Social Work Note  01/04/2019 Name: Alecea Sagar MRN: [id number] DOB: March 01, 1951  Denice Scofield is a 68 y.o. year old female who sees [name], MD for primary care. The CCM team was consulted for assistance with Level of Care Concerns.   Phone call to patient's daughter to discuss potential bed offer at [facility]. Voicemail message left requesting a return call.  Goals Addressed   None     Follow Up Plan: SW will follow up with patient's daughter by phone over the next 7 days  Chrystal Land, [place] Clinical Social Worker  [facility] Care [facility] [phone]

## 2019-01-07 ENCOUNTER — Ambulatory Visit: Payer: Self-pay | Admitting: *Deleted

## 2019-01-07 DIAGNOSIS — R413 Other amnesia: Secondary | ICD-10-CM

## 2019-01-07 DIAGNOSIS — F0391 Unspecified dementia with behavioral disturbance: Secondary | ICD-10-CM

## 2019-01-07 DIAGNOSIS — F03B18 Unspecified dementia, moderate, with other behavioral disturbance: Secondary | ICD-10-CM

## 2019-01-07 NOTE — Chronic Care Management (AMB) (Signed)
  Chronic Care Management    Clinical Social Work Follow Up Note  01/07/2019 Name: Erica Stevens MRN: [id number] DOB: 29-Aug-1951  Erica Stevens is a 68 y.o. year old female who is a primary care patient of [name], MD. The CCM team was consulted for assistance with Level of Care Concerns.   Phone call received from Melody, Admission's Director at [facility] to discuss bed availabilities and to obtain additional information. This Child psychotherapist provided Melody with patient's daughter's number to obtain additional information regarding patient's condition. Patient's daughter contacted to inform her of anticipated call.    Review of patient status, including review of consultants reports, other relevant assessments, and collaboration with appropriate care team members and the patient's provider was performed as part of comprehensive patient evaluation and provision of chronic care management services.     Goals Addressed            This Visit's Progress   . "per daughter, I need help placing mom in a facility" (pt-stated)        Current Barriers:  . Limited social support . Level of care concerns . ADL IADL limitations . Memory Deficits . Inability to perform ADL's independently . Inability to perform IADL's independently  Clinical Social Work Clinical Goal(s):  Marland Kitchen Over the next 30 days, client will work with SW to address concerns related to placement in facility care  Interventions: . Follow up phone call to patient's daughter to discuss possible bed offer at [facility]. . Discussed with patient's daughter, possible bed offer at [facility] and that the Admissions Director, Melody will be calling her to obtain additional information regarding placement.  Patient Self Care Activities:  . Attends all scheduled provider appointments  .    Plan:   . This social will continue to follow up with [facility] for bed availability  Please see  past updates related to this goal by clicking on the "Past Updates" button in the selected goal          Follow Up Plan: SW will follow up with patient's by phone over the next 7 days regarding placement availability.  Verna Czech, LCSW [facility] [phone]

## 2019-01-07 NOTE — Chronic Care Management (AMB) (Signed)
  Chronic Care Management   Social Work Note  01/07/2019 Name: Erica Stevens MRN: [id number] DOB: 01-17-1951  Erica Stevens is a 68 y.o. year old female who sees [name], MD for primary care. The CCM team was consulted for assistance with Level of Care Concerns.   Phone call from admissions coordinator from [facility] [place] to discuss bed availability. Contact information for patient's daughter, [name] provided to set up in home assessment and to obtain additional information.  Goals Addressed   None     Follow Up Plan: SW will follow up with patient's  by phone over the next 7 days regarding placement.  Verna Czech, LCSW [facility] [phone]

## 2019-01-07 NOTE — Chronic Care Management (AMB) (Signed)
  Chronic Care Management    Clinical Social Work General Follow Up Note  01/07/2019 Name: Lucrezia Back MRN: [id number] DOB: 1951-03-24  Rosmarie Hearrell is a 68 y.o. year old female who is a primary care patient of [name], MD. The CCM team was consulted for assistance with Level of Care Concerns.   Review of patient status, including review of consultants reports, relevant laboratory and other test results, and collaboration with appropriate care team members and the patient's provider was performed as part of comprehensive patient evaluation and provision of chronic care management services.    Goals Addressed            This Visit's Progress   . "per daughter, I need help placing mom in a facility" (pt-stated)       Current Barriers:  . Limited social support . Level of care concerns . ADL IADL limitations . Memory Deficits . Inability to perform ADL's independently . Inability to perform IADL's independently  Clinical Social Work Clinical Goal(s):  Marland Kitchen Over the next 30 days, client will work with SW to address concerns related to placement in facility care  Interventions: . Follow up phone call to [facility] to confirm Medicaid bed availability. This social worker spoke with [place] who confirmed that they have received patient's FL2 and it is in review. . Melody will review the information and will call this social worker back to schedule a face to face assessment.  Patient Self Care Activities:  . Attends all scheduled provider appointments  .    Plan:   . This Child psychotherapist will continue to follow up with [facility] for review and possible bed offer . This social will continue to follow up with [facility] for bed availability  Please see past updates related to this goal by clicking on the "Past Updates" button in the selected goal           Follow Up Plan: SW will follow up with patient by phone over the next 7 days  regarding bed availability    SIGNATURE

## 2019-01-07 NOTE — Patient Instructions (Addendum)
Thank you allowing the Chronic Care Management Team to be a part of your care! It was a pleasure speaking with you today!  1. Please anticipate a phone call from Melody, Admissions Director from [facility] to discuss bed availabilities.       CCM (Chronic Care Management) Team   Yvone Neu RN, BSN Nurse Care Coordinator  [phone]  Karalee Height PharmD  Clinical Pharmacist  [phone]   Verna Czech, LCSW Clinical Social Worker [phone]  Goals Addressed            This Visit's Progress   . "per daughter, I need help placing mom in a facility" (pt-stated)       Current Barriers:  . Limited social support . Level of care concerns . ADL IADL limitations . Memory Deficits . Inability to perform ADL's independently . Inability to perform IADL's independently  Clinical Social Work Clinical Goal(s):  Marland Kitchen Over the next 30 days, client will work with SW to address concerns related to placement in facility care  Interventions: . Follow up phone call to patient's daughter to discuss possible bed offer at [facility]. . Discussed with patient's daughter, possible bed offer at [facility] and that the Admissions Director, Melody will be calling her to obtain additional information regarding placement.  Patient Self Care Activities:  . Attends all scheduled provider appointments  .    Plan:   . This social will continue to follow up with [facility] for bed availability  Please see past updates related to this goal by clicking on the "Past Updates" button in the selected goal          The patient verbalized understanding of instructions provided today and declined a print copy of patient instruction materials.   Telephone follow up appointment with CCM team member scheduled for:01/08/2019

## 2019-01-08 ENCOUNTER — Ambulatory Visit: Payer: Self-pay | Admitting: *Deleted

## 2019-01-08 ENCOUNTER — Telehealth: Payer: Self-pay | Admitting: *Deleted

## 2019-01-08 DIAGNOSIS — R413 Other amnesia: Secondary | ICD-10-CM

## 2019-01-08 DIAGNOSIS — F0391 Unspecified dementia with behavioral disturbance: Secondary | ICD-10-CM

## 2019-01-08 DIAGNOSIS — F03B18 Unspecified dementia, moderate, with other behavioral disturbance: Secondary | ICD-10-CM

## 2019-01-08 NOTE — Patient Instructions (Signed)
Thank you allowing the Chronic Care Management Team to be a part of your care! It was a pleasure speaking with you today!  1. Please continue to work with Melody, admissions coordinator at the [facility] for bed availability. Face to Face assessment scheduled for 01/10/19. 2. Please continue to follow up with CCM social worker regarding placement decisions.    CCM (Chronic Care Management) Team   Yvone Neu RN, BSN Nurse Care Coordinator  [phone]  Karalee Height PharmD  Clinical Pharmacist  [phone]   Verna Czech, LCSW Clinical Social Worker [phone]  Goals Addressed            This Visit's Progress   . "per daughter, I need help placing mom in a facility" (pt-stated)       Current Barriers:  . Limited social support . Level of care concerns . ADL IADL limitations . Memory Deficits . Inability to perform ADL's independently . Inability to perform IADL's independently  Clinical Social Work Clinical Goal(s):  Marland Kitchen Over the next 30 days, client will work with SW to address concerns related to placement in facility care  Interventions: . Follow up phone call to patient's daughter to discuss possible bed offer at [facility]. . Face to face assessment scheduled for 01/10/2019 with [facility] . Patient's response to possible placement explored, various approaches discussed.  Patient Self Care Activities:  . Attends all scheduled provider appointments  .    Plan:   . This social will continue to follow up with [facility] for bed availability following the face to face assessment.  Please see past updates related to this goal by clicking on the "Past Updates" button in the selected goal          The patient verbalized understanding of instructions provided today and declined a print copy of patient instruction materials.   The CM team will reach out to the patient again over the next 7 days.

## 2019-01-08 NOTE — Chronic Care Management (AMB) (Signed)
  Chronic Care Management    Clinical Social Work Follow Up Note  01/08/2019 Name: Erica Stevens MRN: [id number] DOB: 12/15/1950  Erica Stevens is a 68 y.o. year old female who is a primary care patient of Erica Cory, MD. The CCM team was consulted for assistance with Level of Care Concerns.   Review of patient status, including review of consultants reports, other relevant assessments, and collaboration with appropriate care team members and the patient's provider was performed as part of comprehensive patient evaluation and provision of chronic care management services.     Goals Addressed            This Visit's Progress   . "per daughter, I need help placing mom in a facility" (pt-stated)       Phone call to patient's daughter today to follow up on phone call with the admissions coordinator, Erica Stevens from [facility]. Patient's daughter discussed receiving information on the facility, payment details, and admissions criteria. Per patient's daughter, the required face to face assessment has been scheduled for 02/09/19. Due to her work schedule, she is not able to be there for the assessment but she will be available by phone. Patient's daughter discussed that patient is now reluctant to consider placement due COVID-19 and visitation restrictions. This Child psychotherapist discussed having a plan for contact to calm patient's fears and to ensure regular contact by phone until restrictions are lifted.  Current Barriers:  . Limited social support . Level of care concerns . ADL IADL limitations . Memory Deficits . Inability to perform ADL's independently . Inability to perform IADL's independently  Clinical Social Work Clinical Goal(s):  Marland Kitchen Over the next 30 days, client will work with SW to address concerns related to placement in facility care  Interventions: . Follow up phone call to patient's daughter to discuss possible bed offer at [facility]. . Face to face assessment  scheduled for 01/10/2019 with [facility] . Patient's response to possible placement explored, various approaches discussed   Patient Self Care Activities:  . Attends all scheduled provider appointments  .    Plan:   . This social will continue to follow up with [facility] for bed availability following face to face assessment.  Please see past updates related to this goal by clicking on the "Past Updates" button in the selected goal          Follow Up Plan: SW will follow up with patient by phone over the next 7 days   Erica Land, LCSW Clinical Social Worker  [facility] Care Management [phone]

## 2019-01-15 ENCOUNTER — Telehealth: Payer: Self-pay

## 2019-01-17 ENCOUNTER — Ambulatory Visit: Payer: Self-pay | Admitting: *Deleted

## 2019-01-17 DIAGNOSIS — F0391 Unspecified dementia with behavioral disturbance: Secondary | ICD-10-CM

## 2019-01-17 DIAGNOSIS — F03B18 Unspecified dementia, moderate, with other behavioral disturbance: Secondary | ICD-10-CM

## 2019-01-17 DIAGNOSIS — R413 Other amnesia: Secondary | ICD-10-CM

## 2019-01-17 NOTE — Chronic Care Management (AMB) (Signed)
  Chronic Care Management    Clinical Social Work Follow Up Note  01/17/2019 Name: Erica Stevens MRN: [id number] DOB: 07-23-1951  Erica Stevens is a 68 y.o. year old female who is a primary care patient of Erica Cory, MD. The CCM team was consulted for assistance with Level of Care Concerns.   Review of patient status, including review of consultants reports, other relevant assessments, and collaboration with appropriate care team members and the patient's provider was performed as part of comprehensive patient evaluation and provision of chronic care management services.     Goals Addressed            This Visit's Progress   . "per daughter, I need help placing mom in a facility" (pt-stated)       Current Barriers:  . Limited social support . Level of care concerns . ADL IADL limitations . Memory Deficits . Inability to perform ADL's independently . Inability to perform IADL's independently  Clinical Social Work Clinical Goal(s):  Marland Kitchen Over the next 30 days, client will work with SW to address concerns related to placement in facility care  Interventions: . Follow up phone call to patient's daughter to discuss possible bed offer at [facility]. . Face to face assessment scheduled for 01/10/2019 with [facility] completed . Patient's daughter reports receiving a follow up call from [facility], but has not returned the call. . Patient's daughter encouraged to return call to follow up on potential placement offer  Patient Self Care Activities:  . Attends all scheduled provider appointments  .    Plan:   . This social will continue to follow up with patient's daughter regarding facility placement for patient.  Please see past updates related to this goal by clicking on the "Past Updates" button in the selected goal          Follow Up Plan: SW will follow up with patient by phone over the next 7 days   Erica Mangel, LCSW Clinical Social Worker   [facility] Care Management [phone]

## 2019-01-17 NOTE — Patient Instructions (Signed)
Thank you allowing the Chronic Care Management Team to be a part of your care! It was a pleasure speaking with you today!  1. Please contact [facility] as soon as possible to discuss facility care placement and potential bed offer. 2. Please follow up with CCM social worker to discuss follow up call with [facility] regarding placement.       CCM (Chronic Care Management) Team   Yvone Neu RN, BSN Nurse Care Coordinator  [phone]  Karalee Height PharmD  Clinical Pharmacist  [phone]   Verna Czech, LCSW Clinical Social Worker [phone]  Goals Addressed            This Visit's Progress   . "per daughter, I need help placing mom in a facility" (pt-stated)       Current Barriers:  . Limited social support . Level of care concerns . ADL IADL limitations . Memory Deficits . Inability to perform ADL's independently . Inability to perform IADL's independently  Clinical Social Work Clinical Goal(s):  Marland Kitchen Over the next 30 days, client will work with SW to address concerns related to placement in facility care  Interventions: . Follow up phone call to patient's daughter to discuss possible bed offer at [facility]. . Face to face assessment scheduled for 01/10/2019 with [facility] completed . Patient's daughter reports receiving a follow up call from [facility], but has not returned the call. . Patient's daughter encouraged to return call to follow up on potential placement offer  Patient Self Care Activities:  . Attends all scheduled provider appointments  .    Plan:   . This social will continue to follow up with patient's daughter regarding facility placement for patient.  Please see past updates related to this goal by clicking on the "Past Updates" button in the selected goal          The patient verbalized understanding of instructions provided today and declined a print copy of patient instruction materials.   The CM  team will reach out to the patient again over the next 7 days.

## 2019-01-24 ENCOUNTER — Telehealth: Payer: Self-pay

## 2019-01-24 ENCOUNTER — Ambulatory Visit: Payer: Self-pay | Admitting: *Deleted

## 2019-01-24 DIAGNOSIS — F03B18 Unspecified dementia, moderate, with other behavioral disturbance: Secondary | ICD-10-CM

## 2019-01-24 DIAGNOSIS — F0391 Unspecified dementia with behavioral disturbance: Secondary | ICD-10-CM

## 2019-01-24 DIAGNOSIS — R413 Other amnesia: Secondary | ICD-10-CM

## 2019-01-24 NOTE — Chronic Care Management (AMB) (Signed)
   Chronic Care Management   Unsuccessful Call Note 01/24/2019 Name: Bethan Lucke MRN: [id number] DOB: 1951/06/23  Shalinda Kuske  is a 68 year old female who sees Dr. [name] for primary care. Dr. Carlynn Purl asked the CCM team to consult the patient for level of care concerns.  This Child psychotherapist has been assisting patient's daugther with Assisted Living Placement. This Child psychotherapist contacted patient's daughter for an update on patient's transfer to [facility].   This social worker was unable to reach patient's daughter via telephone today for status of patient's placement. I have left HIPAA compliant voicemail asking patient to return my call. (unsuccessful outreach #1)   Plan: Will follow-up within 7 business days via telephone.     Verna Czech, LCSW Clinical [facility] Care Management [phone]

## 2019-01-28 ENCOUNTER — Ambulatory Visit: Payer: Self-pay | Admitting: *Deleted

## 2019-01-28 DIAGNOSIS — R413 Other amnesia: Secondary | ICD-10-CM

## 2019-01-28 DIAGNOSIS — F03B18 Unspecified dementia, moderate, with other behavioral disturbance: Secondary | ICD-10-CM

## 2019-01-28 DIAGNOSIS — F0391 Unspecified dementia with behavioral disturbance: Secondary | ICD-10-CM

## 2019-01-28 NOTE — Chronic Care Management (AMB) (Signed)
  Chronic Care Management    Clinical Social Work Follow Up Note  01/28/2019 Name: Erica Stevens MRN: [id number] DOB: [birth year]  Erica Stevens is a 68 y.o. year old female who is a primary care patient of [name], MD. The CCM team was consulted for assistance with Level of Care Concerns.   Review of patient status, including review of consultants reports, other relevant assessments, and collaboration with appropriate care team members and the patient's provider was performed as part of comprehensive patient evaluation and provision of chronic care management services.     Goals Addressed            This Visit's Progress   . "per daughter, I need help placing mom in a facility" (pt-stated)       Phone call to patient's daughter today to follow up on progress with patient's placement to [facility]. Per patient's daughter, patient has been assessed, however she has to return the call of the intake coordinator, Melody for further instructions. Patient's daughter declined need for any further assistance with placement at this time. This Child psychotherapist offered to call the facility on patient's  behalf, however patient's daughter declined any additional assistance at this time. Patient's daughter encouraged to call this IT sales professional with additional needs in the future.  Current Barriers:  . Limited social support . Level of care concerns . ADL IADL limitations . Memory Deficits . Inability to perform ADL's independently . Inability to perform IADL's independently  Clinical Social Work Clinical Goal(s):  Marland Kitchen Over the next 30 days, client will work with SW to address concerns related to placement in facility care  Interventions: . Follow up phone call to patient's daughter to discuss possible bed offer at [facility]. Marland Kitchen Confirmed that Face to face assessment scheduled for 01/10/2019 with [facility] was  completed . Patient's daughter continues to report that she is now  coordinating with the admissions coordinator, Melody regarding [facility] placement. Per patient's daughter, Melody has called her but has not returned the call. . Patient's daughter encouraged to return call to follow up on potential placement offer . Patient's daughter offered assistance to complete the placement process and encouraged to  call this social worker with any additional needs.   Patient Self Care Activities:  . Attends all scheduled provider appointments  .    Plan:   . This social will continue to follow up with patient's daughter regarding facility placement for patient.  Please see past updates related to this goal by clicking on the "Past Updates" button in the selected goal          Follow Up Plan: Client will call wih any addiitonal follow up needs   Verna Czech, LCSW Clinical Social Worker  [facility] Care Management [phone]

## 2019-01-28 NOTE — Patient Instructions (Signed)
Thank you allowing the Chronic Care Management Team to be a part of your care! It was a pleasure speaking with you today!  1. Please feel free to contact this Child psychotherapist with any additional questions assistance needs with patient's transition to [facility].  CCM (Chronic Care Management) Team   Yvone Neu RN, BSN Nurse Care Coordinator  [phone]  Karalee Height PharmD  Clinical Pharmacist  [phone]   Verna Czech, LCSW Clinical Social Worker [phone]  Goals Addressed            This Visit's Progress   . "per daughter, I need help placing mom in a facility" (pt-stated)       Current Barriers:  . Limited social support . Level of care concerns . ADL IADL limitations . Memory Deficits . Inability to perform ADL's independently . Inability to perform IADL's independently  Clinical Social Work Clinical Goal(s):  Marland Kitchen Over the next 30 days, client will work with SW to address concerns related to placement in facility care  Interventions: . Follow up phone call to patient's daughter to discuss possible bed offer at [facility]. Marland Kitchen Confirmed that Face to face assessment scheduled for 01/10/2019 with [facility] was  completed . Patient's daughter continues to report that she is now coordinating with the admissions coordinator, Melody regarding [facility] placement. Per patient's daughter, Melody has called her but has not returned the call. . Patient's daughter encouraged to return call to follow up on potential placement offer . Patient's daughter offered assistance to complete the placement process and encouraged to  call this social worker with any additional needs.   Patient Self Care Activities:  . Attends all scheduled provider appointments  .    Plan:   . This social will continue to follow up with patient's daughter regarding facility placement for patient.  Please see past updates related to this goal by clicking on the "Past  Updates" button in the selected goal          The patient verbalized understanding of instructions provided today and declined a print copy of patient instruction materials.   No further follow up required: Patient's daughter encouraged to call this social worker with any additional questions or concerns related to placement in long term care

## 2019-01-29 ENCOUNTER — Telehealth: Payer: Self-pay

## 2019-02-11 DIAGNOSIS — F0390 Unspecified dementia without behavioral disturbance: Secondary | ICD-10-CM | POA: Diagnosis not present

## 2019-03-10 ENCOUNTER — Emergency Department: Payer: Medicare HMO

## 2019-03-10 ENCOUNTER — Inpatient Hospital Stay
Admission: EM | Admit: 2019-03-10 | Discharge: 2019-03-14 | DRG: 065 | Disposition: A | Discharge status: Skilled Nursing Facility | Payer: Medicare HMO | Attending: Internal Medicine | Admitting: Internal Medicine

## 2019-03-10 ENCOUNTER — Encounter: Payer: Self-pay | Admitting: Emergency Medicine

## 2019-03-10 ENCOUNTER — Other Ambulatory Visit: Payer: Self-pay

## 2019-03-10 DIAGNOSIS — G894 Chronic pain syndrome: Secondary | ICD-10-CM | POA: Diagnosis present

## 2019-03-10 DIAGNOSIS — E876 Hypokalemia: Secondary | ICD-10-CM | POA: Diagnosis present

## 2019-03-10 DIAGNOSIS — Z1159 Encounter for screening for other viral diseases: Secondary | ICD-10-CM | POA: Diagnosis not present

## 2019-03-10 DIAGNOSIS — F03B18 Unspecified dementia, moderate, with other behavioral disturbance: Secondary | ICD-10-CM | POA: Diagnosis present

## 2019-03-10 DIAGNOSIS — R531 Weakness: Secondary | ICD-10-CM

## 2019-03-10 DIAGNOSIS — I5022 Chronic systolic (congestive) heart failure: Secondary | ICD-10-CM | POA: Diagnosis not present

## 2019-03-10 DIAGNOSIS — N181 Chronic kidney disease, stage 1: Secondary | ICD-10-CM | POA: Diagnosis not present

## 2019-03-10 DIAGNOSIS — S299XXA Unspecified injury of thorax, initial encounter: Secondary | ICD-10-CM | POA: Diagnosis not present

## 2019-03-10 DIAGNOSIS — I130 Hypertensive heart and chronic kidney disease with heart failure and stage 1 through stage 4 chronic kidney disease, or unspecified chronic kidney disease: Secondary | ICD-10-CM | POA: Diagnosis not present

## 2019-03-10 DIAGNOSIS — S0990XA Unspecified injury of head, initial encounter: Secondary | ICD-10-CM | POA: Diagnosis not present

## 2019-03-10 DIAGNOSIS — I69391 Dysphagia following cerebral infarction: Secondary | ICD-10-CM | POA: Diagnosis not present

## 2019-03-10 DIAGNOSIS — I69354 Hemiplegia and hemiparesis following cerebral infarction affecting left non-dominant side: Secondary | ICD-10-CM | POA: Diagnosis not present

## 2019-03-10 DIAGNOSIS — F0391 Unspecified dementia with behavioral disturbance: Secondary | ICD-10-CM | POA: Diagnosis present

## 2019-03-10 DIAGNOSIS — Z9049 Acquired absence of other specified parts of digestive tract: Secondary | ICD-10-CM | POA: Diagnosis not present

## 2019-03-10 DIAGNOSIS — G8194 Hemiplegia, unspecified affecting left nondominant side: Secondary | ICD-10-CM | POA: Diagnosis not present

## 2019-03-10 DIAGNOSIS — Z20828 Contact with and (suspected) exposure to other viral communicable diseases: Secondary | ICD-10-CM | POA: Diagnosis not present

## 2019-03-10 DIAGNOSIS — Y92009 Unspecified place in unspecified non-institutional (private) residence as the place of occurrence of the external cause: Secondary | ICD-10-CM | POA: Diagnosis not present

## 2019-03-10 DIAGNOSIS — Z825 Family history of asthma and other chronic lower respiratory diseases: Secondary | ICD-10-CM

## 2019-03-10 DIAGNOSIS — I639 Cerebral infarction, unspecified: Secondary | ICD-10-CM | POA: Diagnosis present

## 2019-03-10 DIAGNOSIS — I5042 Chronic combined systolic (congestive) and diastolic (congestive) heart failure: Secondary | ICD-10-CM | POA: Diagnosis not present

## 2019-03-10 DIAGNOSIS — R4781 Slurred speech: Secondary | ICD-10-CM | POA: Diagnosis present

## 2019-03-10 DIAGNOSIS — E785 Hyperlipidemia, unspecified: Secondary | ICD-10-CM | POA: Diagnosis not present

## 2019-03-10 DIAGNOSIS — F17210 Nicotine dependence, cigarettes, uncomplicated: Secondary | ICD-10-CM | POA: Diagnosis present

## 2019-03-10 DIAGNOSIS — Z818 Family history of other mental and behavioral disorders: Secondary | ICD-10-CM

## 2019-03-10 DIAGNOSIS — W19XXXA Unspecified fall, initial encounter: Secondary | ICD-10-CM | POA: Diagnosis present

## 2019-03-10 DIAGNOSIS — Z881 Allergy status to other antibiotic agents status: Secondary | ICD-10-CM

## 2019-03-10 DIAGNOSIS — F0151 Vascular dementia with behavioral disturbance: Secondary | ICD-10-CM | POA: Diagnosis not present

## 2019-03-10 DIAGNOSIS — Z91041 Radiographic dye allergy status: Secondary | ICD-10-CM

## 2019-03-10 DIAGNOSIS — M810 Age-related osteoporosis without current pathological fracture: Secondary | ICD-10-CM | POA: Diagnosis present

## 2019-03-10 DIAGNOSIS — Z823 Family history of stroke: Secondary | ICD-10-CM

## 2019-03-10 DIAGNOSIS — Z8673 Personal history of transient ischemic attack (TIA), and cerebral infarction without residual deficits: Secondary | ICD-10-CM

## 2019-03-10 DIAGNOSIS — Z882 Allergy status to sulfonamides status: Secondary | ICD-10-CM

## 2019-03-10 DIAGNOSIS — Z79899 Other long term (current) drug therapy: Secondary | ICD-10-CM

## 2019-03-10 DIAGNOSIS — Z90710 Acquired absence of both cervix and uterus: Secondary | ICD-10-CM | POA: Diagnosis not present

## 2019-03-10 DIAGNOSIS — R29810 Facial weakness: Secondary | ICD-10-CM | POA: Diagnosis present

## 2019-03-10 DIAGNOSIS — Z7982 Long term (current) use of aspirin: Secondary | ICD-10-CM

## 2019-03-10 DIAGNOSIS — I2510 Atherosclerotic heart disease of native coronary artery without angina pectoris: Secondary | ICD-10-CM | POA: Diagnosis present

## 2019-03-10 DIAGNOSIS — I10 Essential (primary) hypertension: Secondary | ICD-10-CM

## 2019-03-10 DIAGNOSIS — I6389 Other cerebral infarction: Principal | ICD-10-CM | POA: Diagnosis present

## 2019-03-10 DIAGNOSIS — E7800 Pure hypercholesterolemia, unspecified: Secondary | ICD-10-CM | POA: Diagnosis present

## 2019-03-10 DIAGNOSIS — M2550 Pain in unspecified joint: Secondary | ICD-10-CM | POA: Diagnosis not present

## 2019-03-10 DIAGNOSIS — Z8249 Family history of ischemic heart disease and other diseases of the circulatory system: Secondary | ICD-10-CM | POA: Diagnosis not present

## 2019-03-10 DIAGNOSIS — N3000 Acute cystitis without hematuria: Secondary | ICD-10-CM | POA: Diagnosis present

## 2019-03-10 DIAGNOSIS — Z7401 Bed confinement status: Secondary | ICD-10-CM | POA: Diagnosis not present

## 2019-03-10 DIAGNOSIS — I6350 Cerebral infarction due to unspecified occlusion or stenosis of unspecified cerebral artery: Secondary | ICD-10-CM | POA: Diagnosis not present

## 2019-03-10 DIAGNOSIS — R404 Transient alteration of awareness: Secondary | ICD-10-CM | POA: Diagnosis not present

## 2019-03-10 DIAGNOSIS — Z951 Presence of aortocoronary bypass graft: Secondary | ICD-10-CM | POA: Diagnosis not present

## 2019-03-10 DIAGNOSIS — I6523 Occlusion and stenosis of bilateral carotid arteries: Secondary | ICD-10-CM | POA: Diagnosis not present

## 2019-03-10 DIAGNOSIS — F329 Major depressive disorder, single episode, unspecified: Secondary | ICD-10-CM | POA: Diagnosis present

## 2019-03-10 DIAGNOSIS — I25810 Atherosclerosis of coronary artery bypass graft(s) without angina pectoris: Secondary | ICD-10-CM | POA: Diagnosis present

## 2019-03-10 DIAGNOSIS — I340 Nonrheumatic mitral (valve) insufficiency: Secondary | ICD-10-CM | POA: Diagnosis present

## 2019-03-10 DIAGNOSIS — M6281 Muscle weakness (generalized): Secondary | ICD-10-CM | POA: Diagnosis not present

## 2019-03-10 DIAGNOSIS — Z5320 Procedure and treatment not carried out because of patient's decision for unspecified reasons: Secondary | ICD-10-CM | POA: Diagnosis present

## 2019-03-10 DIAGNOSIS — S22080A Wedge compression fracture of T11-T12 vertebra, initial encounter for closed fracture: Secondary | ICD-10-CM | POA: Diagnosis not present

## 2019-03-10 DIAGNOSIS — Z8349 Family history of other endocrine, nutritional and metabolic diseases: Secondary | ICD-10-CM

## 2019-03-10 DIAGNOSIS — R41841 Cognitive communication deficit: Secondary | ICD-10-CM | POA: Diagnosis not present

## 2019-03-10 DIAGNOSIS — K210 Gastro-esophageal reflux disease with esophagitis: Secondary | ICD-10-CM | POA: Diagnosis present

## 2019-03-10 DIAGNOSIS — Z9181 History of falling: Secondary | ICD-10-CM | POA: Diagnosis not present

## 2019-03-10 DIAGNOSIS — R2689 Other abnormalities of gait and mobility: Secondary | ICD-10-CM | POA: Diagnosis not present

## 2019-03-10 DIAGNOSIS — I700 Atherosclerosis of aorta: Secondary | ICD-10-CM | POA: Diagnosis present

## 2019-03-10 DIAGNOSIS — F0390 Unspecified dementia without behavioral disturbance: Secondary | ICD-10-CM | POA: Diagnosis not present

## 2019-03-10 LAB — CBC
HCT: 40.9 % (ref 36.0–46.0)
Hemoglobin: 13.7 g/dL (ref 12.0–15.0)
MCH: 31.2 pg (ref 26.0–34.0)
MCHC: 33.5 g/dL (ref 30.0–36.0)
MCV: 93.2 fL (ref 80.0–100.0)
Platelets: 256 10*3/uL (ref 150–400)
RBC: 4.39 MIL/uL (ref 3.87–5.11)
RDW: 12.6 % (ref 11.5–15.5)
WBC: 8.3 10*3/uL (ref 4.0–10.5)
nRBC: 0 % (ref 0.0–0.2)

## 2019-03-10 LAB — BASIC METABOLIC PANEL
Anion gap: 12 (ref 5–15)
BUN: 9 mg/dL (ref 8–23)
CO2: 25 mmol/L (ref 22–32)
Calcium: 9 mg/dL (ref 8.9–10.3)
Chloride: 102 mmol/L (ref 98–111)
Creatinine, Ser: 0.92 mg/dL (ref 0.44–1.00)
GFR calc Af Amer: >60 mL/min (ref >60)
GFR calc non Af Amer: >60 mL/min (ref >60)
Glucose, Bld: 98 mg/dL (ref 70–99)
Potassium: 3.1 mmol/L — ABNORMAL LOW (ref 3.5–5.1)
Sodium: 139 mmol/L (ref 135–145)

## 2019-03-10 LAB — URINALYSIS, COMPLETE (UACMP) WITH MICROSCOPIC
Bilirubin Urine: NEGATIVE
Glucose, UA: NEGATIVE mg/dL
Hgb urine dipstick: NEGATIVE
Ketones, ur: NEGATIVE mg/dL
Nitrite: NEGATIVE
Protein, ur: 30 mg/dL — AB
Specific Gravity, Urine: 1.015 (ref 1.005–1.030)
pH: 6 (ref 5.0–8.0)

## 2019-03-10 LAB — TROPONIN I (HIGH SENSITIVITY): Troponin I (High Sensitivity): 10 ng/L (ref <18)

## 2019-03-10 MED ORDER — SODIUM CHLORIDE 0.9 % IV BOLUS
1000.0000 mL | Freq: Once | INTRAVENOUS | Status: AC
  Administered 2019-03-10: 1000 mL via INTRAVENOUS

## 2019-03-10 MED ORDER — SODIUM CHLORIDE 0.9 % IV SOLN
1.0000 g | Freq: Once | INTRAVENOUS | Status: AC
  Administered 2019-03-11: 1 g via INTRAVENOUS
  Filled 2019-03-10: qty 10

## 2019-03-10 MED ORDER — AMLODIPINE BESYLATE 5 MG PO TABS
5.0000 mg | ORAL_TABLET | Freq: Once | ORAL | Status: AC
  Administered 2019-03-10: 5 mg via ORAL
  Filled 2019-03-10: qty 1

## 2019-03-10 MED ORDER — ENALAPRIL MALEATE 10 MG PO TABS
10.0000 mg | ORAL_TABLET | Freq: Once | ORAL | Status: AC
  Administered 2019-03-10: 10 mg via ORAL
  Filled 2019-03-10: qty 1

## 2019-03-10 MED ORDER — SODIUM CHLORIDE 0.9% FLUSH: 3.0000 mL | Freq: Once | INTRAVENOUS | Status: DC

## 2019-03-10 NOTE — ED Notes (Signed)
Pt waiting patiently in waiting room for treatment room; awake and alert; talking in complete coherent sentences;

## 2019-03-10 NOTE — ED Provider Notes (Signed)
[facility] Emergency Department Provider Note  ____________________________________________   First MD Initiated Contact with Patient 03/10/19 2122     (approximate)  I have reviewed the triage vital signs and the nursing notes.   HISTORY  Chief Complaint Fall and Weakness    HPI Erica Stevens is a 68 y.o. female with past medical history as below who generalized weakness.  History limited due to dementia.  Per daughter's report, the patient has been increasingly weak throughout the day today.  She reportedly had a fall because her legs gave out.  She has been essentially unable to walk since then, due to weakness.  Has not complained of any pain.  Daughter does not believe that she hit her head.  She is at her apparent baseline mental status, though she does appear more tired than usual.  No recent medication change.  No recent cough, chills, or other illness.  No known sick contacts in the home.  Remainder of history limited secondary to history of dementia.        Past Medical History:  Diagnosis Date   Allergic rhinitis due to pollen    Anxiety    Brachial neuritis or radiculitis NOS    Cellulitis and abscess of unspecified site    Chronic kidney disease, stage I    Chronic pain syndrome    Congestive heart failure, unspecified    Coronary atherosclerosis of unspecified type of vessel, native or graft    Degeneration of cervical intervertebral disc    Depression    Hypertension    Migraine, unspecified, without mention of intractable migraine without mention of status migrainosus    Mitral valve disorders(424.0)    Osteoporosis, unspecified    Pure hypercholesterolemia    Reflux esophagitis    Thoracic or lumbosacral neuritis or radiculitis, unspecified    Unspecified urinary incontinence     Patient Active Problem List   Diagnosis Date Noted   History of drug dependence/abuse (HCC) 08/01/2018   Chronic systolic  congestive heart failure (HCC) 08/01/2018   Atherosclerosis of aorta (HCC) 05/22/2017   Fracture of thoracic spine (HCC) 05/22/2017   Slurred speech 03/01/2017   Opiate overdose (HCC) 01/13/2017   Cocaine abuse (HCC) 01/13/2017   Benzodiazepine abuse (HCC) 01/13/2017   Heroin abuse (HCC) 05/03/2016   Anoxia 05/03/2016   Moderate dementia with behavioral disturbance (HCC) 05/03/2016   Accidental heroin overdose (HCC) 05/03/2016   Severe recurrent major depression (HCC) 05/01/2016   Angina pectoris (HCC) 02/24/2016   Cervical disc disease 10/02/2015   Affective disorder, major 07/02/2015   Benign hypertensive heart disease 03/09/2015   Migraine 03/09/2015   Billowing mitral valve 03/09/2015   Osteoporosis 03/09/2015   Urge incontinence 03/09/2015   Chest pressure 06/21/2013   Leg edema 06/21/2013   Shortness of breath 06/21/2013   CAD (coronary artery disease) of artery bypass graft 06/21/2013   Hyperlipidemia 06/21/2013   Chronic pain 03/25/2009   Hypercholesterolemia without hypertriglyceridemia 05/22/2007   Coronary atherosclerosis 05/22/2007   Lumbosacral neuritis 12/29/2006   Brachial neuritis 12/29/2006    Past Surgical History:  Procedure Laterality Date   ABDOMINAL HYSTERECTOMY     BLADDER SURGERY     BYPASS GRAFT     CARDIAC CATHETERIZATION  2014   [facility]   CHOLECYSTECTOMY     COLONOSCOPY     CORONARY ARTERY BYPASS GRAFT     x3 grafts;[facility]   fasciotomy      Prior to Admission medications   Medication Sig Start  Date End Date Taking? Authorizing Provider  amLODipine (NORVASC) 5 MG tablet Take 1 tablet (5 mg total) by mouth daily. 09/24/18   Hubbard Hartshorn, FNP  aspirin 81 MG tablet Take 1 tablet (81 mg total) by mouth daily. 09/24/18   Hubbard Hartshorn, FNP  atorvastatin (LIPITOR) 80 MG tablet Take 1 tablet (80 mg total) by mouth daily. 09/24/18   Hubbard Hartshorn, FNP  donepezil (ARICEPT) 5 MG tablet Take 5 mg by mouth daily. 10/30/18    Anabel Bene, MD  enalapril (VASOTEC) 10 MG tablet Take 1 tablet (10 mg total) by mouth 2 (two) times daily. 09/24/18   Hubbard Hartshorn, FNP  metoprolol succinate (TOPROL-XL) 25 MG 24 hr tablet TAKE ONE (1) TABLET EACH DAY 09/24/18   Hubbard Hartshorn, FNP  vitamin B-12 (CYANOCOBALAMIN) 500 MCG tablet Take 500 mcg by mouth daily.    Jannifer Franklin, NP  VITAMIN D, CHOLECALCIFEROL, PO Take by mouth.    Jannifer Franklin, NP    Allergies Erythromycin, Ivp dye [iodinated diagnostic agents], Sulfa antibiotics, and Sulfur  Family History  Problem Relation Age of Onset   Hypertension Mother    Hyperlipidemia Mother    Heart disease Mother    COPD Sister    Depression Daughter    Drug abuse Son     Social History Social History   Tobacco Use   Smoking status: Current Every Day Smoker    Packs/day: 1.50    Types: Cigarettes   Smokeless tobacco: Never Used  Substance Use Topics   Alcohol use: No   Drug use: Yes    Types: Cocaine, Heroin, Hydrocodone, Benzodiazepines    Comment: quit 04-2017    Review of Systems  Review of Systems  Unable to perform ROS: Dementia     ____________________________________________  PHYSICAL EXAM:      VITAL SIGNS: ED Triage Vitals  Enc Vitals Group     BP 03/10/19 1421 (!) 223/96     Pulse Rate 03/10/19 1421 67     Resp 03/10/19 1421 18     Temp 03/10/19 1421 98.2 F (36.8 C)     Temp Source 03/10/19 1421 Oral     SpO2 03/10/19 1421 98 %     Weight 03/10/19 1415 130 lb (59 kg)     Height 03/10/19 1415 5' (1.524 m)     Head Circumference --      Peak Flow --      Pain Score 03/10/19 1415 0     Pain Loc --      Pain Edu? --      Excl. in [place]? --      Physical Exam Vitals signs and nursing note reviewed.  Constitutional:      General: She is not in acute distress.    Appearance: She is well-developed.  HENT:     Head: Normocephalic and atraumatic.  Eyes:     Conjunctiva/sclera: Conjunctivae normal.  Neck:      Musculoskeletal: Neck supple.  Cardiovascular:     Rate and Rhythm: Normal rate and regular rhythm.     Heart sounds: Normal heart sounds. No murmur. No friction rub.  Pulmonary:     Effort: Pulmonary effort is normal. No respiratory distress.     Breath sounds: Normal breath sounds. No wheezing or rales.  Abdominal:     General: There is no distension.     Palpations: Abdomen is soft.     Tenderness: There is no abdominal  tenderness.  Skin:    General: Skin is warm.     Capillary Refill: Capillary refill takes less than 2 seconds.  Neurological:     Mental Status: She is alert.     Motor: No abnormal muscle tone.     Comments: Alert, oriented to person and place, but not time.  Cranial nerves intact.  No focal neurological deficits.  Moves all extremities.       ____________________________________________   LABS (all labs ordered are listed, but only abnormal results are displayed)  Labs Reviewed  BASIC METABOLIC PANEL - Abnormal; Notable for the following components:      Result Value   Potassium 3.1 (*)    All other components within normal limits  URINALYSIS, COMPLETE (UACMP) WITH MICROSCOPIC - Abnormal; Notable for the following components:   Color, Urine YELLOW (*)    APPearance CLOUDY (*)    Protein, ur 30 (*)    Leukocytes,Ua TRACE (*)    Bacteria, UA RARE (*)    All other components within normal limits  CBC  TROPONIN I (HIGH SENSITIVITY)  TROPONIN I (HIGH SENSITIVITY)    ____________________________________________  EKG: Normal sinus rhythm, ventricular rate 68.  Left ventricular hypertrophy.  PR 178, QRS 86, QTc 418.  No acute ischemic changes. ________________________________________  RADIOLOGY All imaging, including plain films, CT scans, and ultrasounds, independently reviewed by me, and interpretations confirmed via formal radiology reads.  ED MD interpretation:   Pending  Official radiology report(s): Ct Head Wo Contrast  Result Date:  03/10/2019 CLINICAL DATA:  Fall, altered level of consciousness. EXAM: CT HEAD WITHOUT CONTRAST TECHNIQUE: Contiguous axial images were obtained from the base of the skull through the vertex without intravenous contrast. COMPARISON:  12/05/2018 FINDINGS: Brain: There is atrophy and chronic small vessel disease changes. Bilateral lacunar infarcts. No acute intracranial abnormality. Specifically, no hemorrhage, hydrocephalus, mass lesion, acute infarction, or significant intracranial injury. Vascular: No hyperdense vessel or unexpected calcification. Skull: No acute calvarial abnormality. Sinuses/Orbits: Visualized paranasal sinuses and mastoids clear. Orbital soft tissues unremarkable. Other: None IMPRESSION: Old bilateral basal ganglia lacunar infarcts. Atrophy, chronic microvascular disease. No acute intracranial abnormality. Electronically Signed   By: Charlett NoseKevin  Dover M.D.   On: 03/10/2019 23:11   Ct Lumbar Spine Wo Contrast  Result Date: 03/10/2019 CLINICAL DATA:  Weakness, back pain, fell earlier today, dementia EXAM: CT LUMBAR SPINE WITHOUT CONTRAST TECHNIQUE: Multidetector CT imaging of the lumbar spine was performed without intravenous contrast administration. Multiplanar CT image reconstructions were also generated. COMPARISON:  Lumbar spine radiographs 05/22/2012 FINDINGS: Segmentation: Prior lumbar radiographs demonstrate 5 non-rib-bearing lumbar type vertebra. Alignment: Normal Vertebrae: Diffuse osseous demineralization. Lumbar vertebra normal in height and alignment. No lumbar fracture, subluxation, or bone destruction. However, significant compression deformity of the T12 vertebral body is identified with approximately 60-70% anterior height loss, progressive since 02/25/2017. No additional lumbar fractures. Visualized pelvis intact. Paraspinal and other soft tissues: Probable vascular calcifications at LEFT kidney. RIGHT renal mass 3.4 x 3.2 cm, low attenuation consistent with cyst. Atherosclerotic  calcifications aorta and iliac arteries. Disc levels: No significant disc abnormalities at any level. IMPRESSION: Progressive compression fracture of the T12 vertebral body now demonstrating 60-70% height loss, increased since 02/25/2017. Osseous demineralization without additional acute bony abnormalities. RIGHT renal cyst 3.4 x 3.2 cm; character of this lesion is poorly assessed by this CT exam, recommend follow-up non emergent renal sonographic characterization. Electronically Signed   By: Ulyses SouthwardMark  Boles M.D.   On: 03/10/2019 23:19   Dg Chest Portable  1 View  Result Date: 03/10/2019 CLINICAL DATA:  Weakness, fell earlier today, dementia, history CHF, chronic kidney disease, coronary artery disease, smoker EXAM: PORTABLE CHEST 1 VIEW COMPARISON:  Portable exam 2228 hours compared to 02/28/2017 FINDINGS: Minimal enlargement of cardiac silhouette post CABG. Mediastinal contours and pulmonary vascularity normal. Atherosclerotic calcifications aorta. Lungs clear. No definite infiltrate, pleural effusion or pneumothorax. Bones demineralized. IMPRESSION: Post CABG. No acute abnormalities. Electronically Signed   By: Ulyses SouthwardMark  Boles M.D.   On: 03/10/2019 22:57    ____________________________________________  PROCEDURES   Procedure(s) performed (including Critical Care):  Procedures  ____________________________________________  INITIAL IMPRESSION / MDM / ASSESSMENT AND PLAN / ED COURSE  As part of my medical decision making, I reviewed the following data within the electronic MEDICAL RECORD NUMBER Notes from prior ED visits and [place] Controlled Substance Database      *Erica Stevens was evaluated in Emergency Department on 03/10/2019 for the symptoms described in the history of present illness. She was evaluated in the context of the global COVID-19 pandemic, which necessitated consideration that the patient might be at risk for infection with the SARS-CoV-2 virus that causes COVID-19. Institutional protocols  and algorithms that pertain to the evaluation of patients at risk for COVID-19 are in a state of rapid change based on information released by regulatory bodies including the CDC and federal and state organizations. These policies and algorithms were followed during the patient's care in the ED.  Some ED evaluations and interventions may be delayed as a result of limited staffing during the pandemic.*      Medical Decision Making: 68 year old female here with generalized weakness and difficulty walking due to this.  Urinalysis shows possible pyuria, rare bacteria, and will treat for possible UTI.  Potassium also low and will be replaced.  She is unable to walk with a walker here, due to generalized weakness.  No focal neurological deficits.  CT scans negative for acute focal stroke or other abnormality.  Chest x-ray without pneumonia.  Will admit for generalized weakness, falls, and possible UTI.  Of note patient does have chronic thoracic compression fracture.  I see no evidence of acute emergent worsening of this, but this could be contributing to her weakness and falls.  ____________________________________________  FINAL CLINICAL IMPRESSION(S) / ED DIAGNOSES  Final diagnoses:  Fall, initial encounter  Acute cystitis without hematuria     MEDICATIONS GIVEN DURING THIS VISIT:  Medications  sodium chloride flush (NS) 0.9 % injection 3 mL (has no administration in time range)  sodium chloride 0.9 % bolus 1,000 mL (1,000 mLs Intravenous Bolus 03/10/19 2238)  amLODipine (NORVASC) tablet 5 mg (5 mg Oral Given 03/10/19 2249)  enalapril (VASOTEC) tablet 10 mg (10 mg Oral Given 03/10/19 2249)     ED Discharge Orders    None       Note:  This document was prepared using Dragon voice recognition software and may include unintentional dictation errors.   Shaune PollackIsaacs, [place], MD 03/10/19 2350

## 2019-03-10 NOTE — ED Triage Notes (Signed)
Pt presents to ED via POV with c/o weakness and fall earlier today. Pt's daughter here with patient due to patient having hx of dementia. Per daughter pt stated that she was unable to hold herself up on her legs. Pt is alert and visualized in NAD, pt's daughter reports hx of same.

## 2019-03-10 NOTE — ED Notes (Signed)
Patient transported to CT 

## 2019-03-11 ENCOUNTER — Inpatient Hospital Stay (HOSPITAL_COMMUNITY)
Admit: 2019-03-11 | Discharge: 2019-03-11 | Disposition: A | Discharge status: Home / Self Care | Payer: Medicare HMO | Attending: Internal Medicine | Admitting: Internal Medicine

## 2019-03-11 ENCOUNTER — Observation Stay: Payer: Medicare HMO

## 2019-03-11 ENCOUNTER — Other Ambulatory Visit: Payer: Medicare HMO

## 2019-03-11 DIAGNOSIS — N3000 Acute cystitis without hematuria: Secondary | ICD-10-CM | POA: Diagnosis present

## 2019-03-11 DIAGNOSIS — I5042 Chronic combined systolic (congestive) and diastolic (congestive) heart failure: Secondary | ICD-10-CM | POA: Diagnosis present

## 2019-03-11 DIAGNOSIS — Z91041 Radiographic dye allergy status: Secondary | ICD-10-CM | POA: Diagnosis not present

## 2019-03-11 DIAGNOSIS — E7800 Pure hypercholesterolemia, unspecified: Secondary | ICD-10-CM | POA: Diagnosis present

## 2019-03-11 DIAGNOSIS — I639 Cerebral infarction, unspecified: Secondary | ICD-10-CM

## 2019-03-11 DIAGNOSIS — Z1159 Encounter for screening for other viral diseases: Secondary | ICD-10-CM | POA: Diagnosis not present

## 2019-03-11 DIAGNOSIS — R531 Weakness: Secondary | ICD-10-CM | POA: Diagnosis not present

## 2019-03-11 DIAGNOSIS — Z951 Presence of aortocoronary bypass graft: Secondary | ICD-10-CM | POA: Diagnosis not present

## 2019-03-11 DIAGNOSIS — Z90710 Acquired absence of both cervix and uterus: Secondary | ICD-10-CM | POA: Diagnosis not present

## 2019-03-11 DIAGNOSIS — F0151 Vascular dementia with behavioral disturbance: Secondary | ICD-10-CM | POA: Diagnosis present

## 2019-03-11 DIAGNOSIS — E785 Hyperlipidemia, unspecified: Secondary | ICD-10-CM | POA: Diagnosis not present

## 2019-03-11 DIAGNOSIS — Z882 Allergy status to sulfonamides status: Secondary | ICD-10-CM | POA: Diagnosis not present

## 2019-03-11 DIAGNOSIS — I5022 Chronic systolic (congestive) heart failure: Secondary | ICD-10-CM | POA: Diagnosis not present

## 2019-03-11 DIAGNOSIS — G8194 Hemiplegia, unspecified affecting left nondominant side: Secondary | ICD-10-CM | POA: Diagnosis present

## 2019-03-11 DIAGNOSIS — I6389 Other cerebral infarction: Secondary | ICD-10-CM | POA: Diagnosis present

## 2019-03-11 DIAGNOSIS — S0990XA Unspecified injury of head, initial encounter: Secondary | ICD-10-CM | POA: Diagnosis not present

## 2019-03-11 DIAGNOSIS — Z9049 Acquired absence of other specified parts of digestive tract: Secondary | ICD-10-CM | POA: Diagnosis not present

## 2019-03-11 DIAGNOSIS — M810 Age-related osteoporosis without current pathological fracture: Secondary | ICD-10-CM | POA: Diagnosis present

## 2019-03-11 DIAGNOSIS — Y92009 Unspecified place in unspecified non-institutional (private) residence as the place of occurrence of the external cause: Secondary | ICD-10-CM | POA: Diagnosis not present

## 2019-03-11 DIAGNOSIS — F17210 Nicotine dependence, cigarettes, uncomplicated: Secondary | ICD-10-CM | POA: Diagnosis present

## 2019-03-11 DIAGNOSIS — N181 Chronic kidney disease, stage 1: Secondary | ICD-10-CM | POA: Diagnosis present

## 2019-03-11 DIAGNOSIS — I6523 Occlusion and stenosis of bilateral carotid arteries: Secondary | ICD-10-CM | POA: Diagnosis not present

## 2019-03-11 DIAGNOSIS — R29810 Facial weakness: Secondary | ICD-10-CM | POA: Diagnosis present

## 2019-03-11 DIAGNOSIS — W19XXXA Unspecified fall, initial encounter: Secondary | ICD-10-CM | POA: Diagnosis present

## 2019-03-11 DIAGNOSIS — Z881 Allergy status to other antibiotic agents status: Secondary | ICD-10-CM | POA: Diagnosis not present

## 2019-03-11 DIAGNOSIS — Z8673 Personal history of transient ischemic attack (TIA), and cerebral infarction without residual deficits: Secondary | ICD-10-CM | POA: Diagnosis not present

## 2019-03-11 DIAGNOSIS — G894 Chronic pain syndrome: Secondary | ICD-10-CM | POA: Diagnosis present

## 2019-03-11 DIAGNOSIS — Z8249 Family history of ischemic heart disease and other diseases of the circulatory system: Secondary | ICD-10-CM | POA: Diagnosis not present

## 2019-03-11 DIAGNOSIS — Z823 Family history of stroke: Secondary | ICD-10-CM | POA: Diagnosis not present

## 2019-03-11 DIAGNOSIS — I2510 Atherosclerotic heart disease of native coronary artery without angina pectoris: Secondary | ICD-10-CM | POA: Diagnosis present

## 2019-03-11 DIAGNOSIS — E876 Hypokalemia: Secondary | ICD-10-CM | POA: Diagnosis present

## 2019-03-11 DIAGNOSIS — I130 Hypertensive heart and chronic kidney disease with heart failure and stage 1 through stage 4 chronic kidney disease, or unspecified chronic kidney disease: Secondary | ICD-10-CM | POA: Diagnosis present

## 2019-03-11 LAB — BASIC METABOLIC PANEL
Anion gap: 10 (ref 5–15)
BUN: 10 mg/dL (ref 8–23)
CO2: 27 mmol/L (ref 22–32)
Calcium: 8.7 mg/dL — ABNORMAL LOW (ref 8.9–10.3)
Chloride: 104 mmol/L (ref 98–111)
Creatinine, Ser: 0.91 mg/dL (ref 0.44–1.00)
GFR calc Af Amer: >60 mL/min (ref >60)
GFR calc non Af Amer: >60 mL/min (ref >60)
Glucose, Bld: 106 mg/dL — ABNORMAL HIGH (ref 70–99)
Potassium: 3.3 mmol/L — ABNORMAL LOW (ref 3.5–5.1)
Sodium: 141 mmol/L (ref 135–145)

## 2019-03-11 LAB — URINE DRUG SCREEN, QUALITATIVE (ARMC ONLY)
Amphetamines, Ur Screen: NOT DETECTED
Barbiturates, Ur Screen: NOT DETECTED
Benzodiazepine, Ur Scrn: NOT DETECTED
Cannabinoid 50 Ng, Ur ~~LOC~~: NOT DETECTED
Cocaine Metabolite,Ur ~~LOC~~: NOT DETECTED
MDMA (Ecstasy)Ur Screen: NOT DETECTED
Methadone Scn, Ur: NOT DETECTED
Opiate, Ur Screen: NOT DETECTED
Phencyclidine (PCP) Ur S: NOT DETECTED
Tricyclic, Ur Screen: NOT DETECTED

## 2019-03-11 LAB — SARS CORONAVIRUS 2 BY RT PCR (HOSPITAL ORDER, PERFORMED IN ~~LOC~~ HOSPITAL LAB): SARS Coronavirus 2: NEGATIVE

## 2019-03-11 LAB — LIPID PANEL
Cholesterol: 214 mg/dL — ABNORMAL HIGH (ref 0–200)
HDL: 50 mg/dL (ref >40)
LDL Cholesterol: 151 mg/dL — ABNORMAL HIGH (ref 0–99)
Total CHOL/HDL Ratio: 4.3 RATIO
Triglycerides: 67 mg/dL (ref <150)
VLDL: 13 mg/dL (ref 0–40)

## 2019-03-11 LAB — CBC
HCT: 40.1 % (ref 36.0–46.0)
Hemoglobin: 13.3 g/dL (ref 12.0–15.0)
MCH: 31.1 pg (ref 26.0–34.0)
MCHC: 33.2 g/dL (ref 30.0–36.0)
MCV: 93.9 fL (ref 80.0–100.0)
Platelets: 261 10*3/uL (ref 150–400)
RBC: 4.27 MIL/uL (ref 3.87–5.11)
RDW: 12.7 % (ref 11.5–15.5)
WBC: 7.6 10*3/uL (ref 4.0–10.5)
nRBC: 0 % (ref 0.0–0.2)

## 2019-03-11 MED ORDER — NITROGLYCERIN 2 % TD OINT
0.5000 [in_us] | TOPICAL_OINTMENT | Freq: Four times a day (QID) | TRANSDERMAL | Status: DC
  Administered 2019-03-11: 0.5 [in_us] via TOPICAL
  Filled 2019-03-11: qty 1

## 2019-03-11 MED ORDER — VITAMIN C 500 MG PO TABS
250.0000 mg | ORAL_TABLET | Freq: Two times a day (BID) | ORAL | Status: DC
  Administered 2019-03-11 – 2019-03-14 (×6): 250 mg via ORAL
  Filled 2019-03-11 (×6): qty 1

## 2019-03-11 MED ORDER — ENOXAPARIN SODIUM 40 MG/0.4ML ~~LOC~~ SOLN
40.0000 mg | SUBCUTANEOUS | Status: DC
  Administered 2019-03-11 – 2019-03-14 (×4): 40 mg via SUBCUTANEOUS
  Filled 2019-03-11 (×4): qty 0.4

## 2019-03-11 MED ORDER — DONEPEZIL HCL 5 MG PO TABS
5.0000 mg | ORAL_TABLET | Freq: Every day | ORAL | Status: DC
  Administered 2019-03-11 – 2019-03-13 (×3): 5 mg via ORAL
  Filled 2019-03-11 (×4): qty 1

## 2019-03-11 MED ORDER — ASPIRIN 81 MG PO CHEW
81.0000 mg | CHEWABLE_TABLET | Freq: Every day | ORAL | Status: DC
  Administered 2019-03-11 – 2019-03-14 (×4): 81 mg via ORAL
  Filled 2019-03-11 (×4): qty 1

## 2019-03-11 MED ORDER — ACETAMINOPHEN 650 MG RE SUPP: 650.0000 mg | Freq: Four times a day (QID) | RECTAL | Status: DC | PRN

## 2019-03-11 MED ORDER — STROKE: EARLY STAGES OF RECOVERY BOOK: Freq: Once | Status: DC

## 2019-03-11 MED ORDER — ONDANSETRON HCL 4 MG PO TABS: 4.0000 mg | ORAL_TABLET | Freq: Four times a day (QID) | ORAL | Status: DC | PRN

## 2019-03-11 MED ORDER — SODIUM CHLORIDE 0.9 % IV SOLN
INTRAVENOUS | Status: DC | PRN
  Administered 2019-03-11: 250 mL via INTRAVENOUS

## 2019-03-11 MED ORDER — ONDANSETRON HCL 4 MG/2ML IJ SOLN: 4.0000 mg | Freq: Four times a day (QID) | INTRAMUSCULAR | Status: DC | PRN

## 2019-03-11 MED ORDER — POTASSIUM CHLORIDE 10 MEQ/100ML IV SOLN
10.0000 meq | INTRAVENOUS | Status: DC
  Administered 2019-03-11: 10 meq via INTRAVENOUS
  Filled 2019-03-11 (×4): qty 100

## 2019-03-11 MED ORDER — ENSURE ENLIVE PO LIQD
237.0000 mL | Freq: Two times a day (BID) | ORAL | Status: DC
  Administered 2019-03-11 – 2019-03-13 (×2): 237 mL via ORAL

## 2019-03-11 MED ORDER — ACETAMINOPHEN 325 MG PO TABS
650.0000 mg | ORAL_TABLET | Freq: Four times a day (QID) | ORAL | Status: DC | PRN
  Administered 2019-03-11 – 2019-03-13 (×2): 650 mg via ORAL
  Filled 2019-03-11 (×3): qty 2

## 2019-03-11 MED ORDER — CLOPIDOGREL BISULFATE 75 MG PO TABS
75.0000 mg | ORAL_TABLET | Freq: Every day | ORAL | Status: DC
  Administered 2019-03-11 – 2019-03-14 (×4): 75 mg via ORAL
  Filled 2019-03-11 (×4): qty 1

## 2019-03-11 MED ORDER — POTASSIUM CHLORIDE CRYS ER 20 MEQ PO TBCR
40.0000 meq | EXTENDED_RELEASE_TABLET | ORAL | Status: AC
  Administered 2019-03-11 (×2): 40 meq via ORAL
  Filled 2019-03-11 (×2): qty 2

## 2019-03-11 MED ORDER — ATORVASTATIN CALCIUM 20 MG PO TABS
40.0000 mg | ORAL_TABLET | Freq: Every day | ORAL | Status: DC
  Administered 2019-03-11: 40 mg via ORAL
  Filled 2019-03-11: qty 2

## 2019-03-11 MED ORDER — ADULT MULTIVITAMIN W/MINERALS CH
1.0000 | ORAL_TABLET | Freq: Every day | ORAL | Status: DC
  Administered 2019-03-12 – 2019-03-14 (×3): 1 via ORAL
  Filled 2019-03-11 (×3): qty 1

## 2019-03-11 MED ORDER — HYDRALAZINE HCL 20 MG/ML IJ SOLN
10.0000 mg | INTRAMUSCULAR | Status: DC | PRN
  Administered 2019-03-11 – 2019-03-13 (×3): 10 mg via INTRAVENOUS
  Filled 2019-03-11 (×5): qty 1

## 2019-03-11 NOTE — Evaluation (Signed)
Occupational Therapy Evaluation Patient Details Name: Erica Stevens MRN: [id number] DOB: [birth year] Today's Date: 03/11/2019    History of Present Illness Pt presented to ED for weakness, fall. PMH of CKD, CABG, chronic pain syndrome, CHF, dementia, depression, HTN. MRI showed;Small acute/early subacute lacunar infarction of right basal ganglia extending from right lentiform nucleus to posterior frontal periventricular white matter. New subcentimeter subacute infarction within left corona radiata. Stable background of advanced chronic microvascular ischemic changes and volume loss of the brain. Stable chronic infarction within the right anterior basal ganglia. No hemorrhage or mass effect. Also, T12 compression fx now showing 60-70% loss of height.   Clinical Impression   Pt seen for OT evaluation this date. Per chart review, prior to hospital admission, pt was independent in all aspects of ADL. Pt lives with her mother in a one-level home with two steps to enter. Currently pt demonstrates significant impairments in LUE/LLE strength, coordination, and sensation. Pt is R hand dominant and requires mod/max assist for assist for seated ADL and +2 max assist for transfers due to decreased AROM of her LLE and LUE. Pt educated on safe positioning of LUE in order to promote functional return, improve safety, and promote skin integrity on this date. Pt appeared to demonstrate significant left neglect on this date. She consistently required prompting in order to attend to this author who was positioned on her left side t/o assessment. Pt states she cannot feel her LUE even in while laying on her left side during bedding change. Minimal muscle tone/activation appreciated during assessment of the LUE. Pt noted to have increased flexion tone/posture t/o her LLE. Pt would benefit from skilled OT to address noted impairments and functional limitations (see below for any additional details) in order to maximize  safety and independence while minimizing falls risk and caregiver burden.  Upon hospital discharge, recommend pt discharge to STR.     Follow Up Recommendations  SNF    Equipment Recommendations  Other (comment)(TBD)    Recommendations for Other Services       Precautions / Restrictions Precautions Precautions: Fall Restrictions Weight Bearing Restrictions: No      Mobility Bed Mobility Overal bed mobility: Needs Assistance Bed Mobility: Rolling Rolling: Max assist;+2 for physical assistance   Supine to sit: Total assist Sit to supine: +2 for physical assistance;Total assist   General bed mobility comments: Patient needed maxA to come to sitting, attempted to initiate movement, total assist for LLE and LUE. Returned to supine with totalx2 for safety  Transfers Overall transfer level: Needs assistance               General transfer comment: deferred for pt safety.    Balance Overall balance assessment: Needs assistance Sitting-balance support: Feet supported Sitting balance-Leahy Scale: Zero Sitting balance - Comments: Pt unable to sit erect, modA to maintain balance, leans to the R with knee blocking and RUE support pt able to occasionally maintain balance with CGA Postural control: Right lateral lean                                 ADL either performed or assessed with clinical judgement   ADL Overall ADL's : Needs assistance/impaired Eating/Feeding: Set up;Minimal assistance;Sitting   Grooming: Set up;Moderate assistance;Minimal assistance;Sitting   Upper Body Bathing: Set up;Moderate assistance;Maximal assistance;Sitting   Lower Body Bathing: Set up;Maximal assistance;Sitting/lateral leans;+2 for physical assistance   Upper Body Dressing : Set up;Sitting;Moderate assistance;Maximal  assistance;Cueing for compensatory techniques   Lower Body Dressing: Set up;+2 for physical assistance;Maximal assistance;Sitting/lateral leans   Toilet  Transfer: +2 for physical assistance;Stand-pivot;BSC;Maximal assistance;Moderate assistance   Toileting- Clothing Manipulation and Hygiene: Moderate assistance;Maximal assistance;Sitting/lateral lean         General ADL Comments: Pt appears to have significant neglect to her L side. Will require consistent prompting and cueing in order to attend to left-sided ADL tasks/activities.     Vision   Vision Assessment?: Vision impaired- to be further tested in functional context;Yes Additional Comments: Pt unable to state. Suspect impairment.     Perception     Praxis Praxis Praxis-Other Comments: Pt had significant difficulty donning hospital gown on this date. Would repeatedly remove gown and attempt to put on inappropriately. Required max assist from this therapist in order to put RUE/LUE through gown ont his date.    Pertinent Vitals/Pain Pain Assessment: No/denies pain     Hand Dominance Right   Extremity/Trunk Assessment Upper Extremity Assessment Upper Extremity Assessment: LUE deficits/detail;RUE deficits/detail RUE Deficits / Details: grossly WFLs RUE Sensation: WNL LUE Deficits / Details: pt negectful of LUE, when able to visualize LUE trace muscle contraction palpated when squeezing this author's fingers, no activation with shoulder flexion, elbow flex/extend, wrist flex/extend appreciated during this assessment. Pt states she does not feel LUE. LUE Sensation: decreased light touch LUE Coordination: decreased fine motor;decreased gross motor   Lower Extremity Assessment Lower Extremity Assessment: Defer to PT evaluation;LLE deficits/detail RLE Deficits / Details: grossly WFLs RLE Sensation: WNL RLE Coordination: WNL LLE Deficits / Details: unable to move LLE independently LLE Sensation: decreased light touch;decreased proprioception LLE Coordination: decreased fine motor;decreased gross motor   Cervical / Trunk Assessment Cervical / Trunk Assessment: Kyphotic    Communication Communication Communication: No difficulties   Cognition Arousal/Alertness: Awake/alert Behavior During Therapy: WFL for tasks assessed/performed Overall Cognitive Status: No family/caregiver present to determine baseline cognitive functioning                                 General Comments: Pt alert, orientedx3, slow to respond/extended time needed but appropriate; stated she had "no clue" what the date was but was able to give accurate year.   General Comments  Pt noted to have what appeared to be chewed food and liquids over right side and bedding upon arrival to room. RN notified and in to room to change bedding. OT assisted with bedding/gown change as well as repositioning pt in bed.    Exercises Other Exercises Other Exercises: Pt educated on safe positioning strategies for mgt of LUE in order to maximize safety and minimize risk of injury or skin break down. RN notified of positioning considerations/strategies. Other Exercises: Pt assisted with PROM of LUE t/o all ranges. Ot provided max assist and encouragement for pt to utilize LUE during PROM. Pt unable to return verbalize/demonstrate understanding.   Shoulder Instructions      Home Living Family/patient expects to be discharged to:: Private residence Living Arrangements: Other (Comment)(mother) Available Help at Discharge: Family;Available PRN/intermittently Type of Home: House Home Access: Stairs to enter [employer] of Steps: 2 Entrance Stairs-Rails: Right;Left;Can reach both Home Layout: One level     Bathroom Shower/Tub: Teacher, early years/pre: Handicapped height     Home Equipment: Environmental consultant - 2 wheels          Prior Functioning/Environment Level of Independence: Needs assistance  Gait / Transfers Assistance  Needed: uses walker in home ADL's / Homemaking Assistance Needed: pt reported being able to dress/bathe independently, mother does cooking/cleaning    Comments: at least 1 fall in the last 6 months        OT Problem List: Decreased strength;Decreased coordination;Decreased range of motion;Decreased cognition;Decreased activity tolerance;Decreased safety awareness;Impaired balance (sitting and/or standing);Decreased knowledge of use of DME or AE;Impaired vision/perception;Decreased knowledge of precautions;Impaired UE functional use;Impaired tone      OT Treatment/Interventions: Self-care/ADL training;Modalities;Balance training;Therapeutic exercise;Therapeutic activities;Neuromuscular education;Energy conservation;Cognitive remediation/compensation;DME and/or AE instruction;Manual therapy;Patient/family education;Visual/perceptual remediation/compensation    OT Goals(Current goals can be found in the care plan section) Acute Rehab OT Goals Patient Stated Goal: Pt did not state any goals ADL Goals Pt Will Perform Eating: with set-up;with adaptive utensils;sitting(With LRAD for improved safety and functional independence.) Pt Will Perform Grooming: sitting;with mod assist;with adaptive equipment(With LRAD for improved safety and functional independence.) Pt Will Perform Upper Body Dressing: with min assist;sitting;with adaptive equipment(With LRAD for improved safety and functional independence.) Pt Will Perform Lower Body Dressing: with mod assist;sitting/lateral leans;with adaptive equipment(With LRAD for improved safety and functional independence.)  OT Frequency: Min 2X/week   Barriers to D/C: Decreased caregiver support;Inaccessible home environment          Co-evaluation              AM-PAC OT "6 Clicks" Daily Activity     Outcome Measure Help from another person eating meals?: A Little Help from another person taking care of personal grooming?: A Lot Help from another person toileting, which includes using toliet, bedpan, or urinal?: A Lot Help from another person bathing (including washing, rinsing, drying)?: A Lot Help  from another person to put on and taking off regular upper body clothing?: A Lot Help from another person to put on and taking off regular lower body clothing?: A Lot 6 Click Score: 13   End of Session Nurse Communication: Other (comment)(Positioning strategies for LUE.)  Activity Tolerance: Patient tolerated treatment well Patient left: in bed;with call bell/phone within reach;with bed alarm set  OT Visit Diagnosis: Hemiplegia and hemiparesis;Other abnormalities of gait and mobility (R26.89) Hemiplegia - Right/Left: Left Hemiplegia - dominant/non-dominant: Non-Dominant Hemiplegia - caused by: Cerebral infarction                Time: 0210-0237 OT Time Calculation (min): 27 min Charges:  OT General Charges $OT Visit: 1 Visit OT Evaluation $OT Eval Moderate Complexity: 1 Mod OT Treatments $Self Care/Home Management : 23-37 mins  Rockney GheeSerenity Tmya Wigington, M.S., OTR/L Ascom: [phone] 03/11/19, 3:51 PM

## 2019-03-11 NOTE — Progress Notes (Addendum)
BP 195/80. Dr. Marcille Blanco made aware. Order to give nitro received.

## 2019-03-11 NOTE — TOC Initial Note (Signed)
Transition of Care [facility](TOC) - Initial/Assessment Note    Patient Details  Name: Erica Stevens MRN: [id number] Date of Birth: 1950/11/18  Transition of Care [facility](TOC) CM/SW Contact:    Erica KernsJennifer L Jhamir Pickup, RN Phone Number: 03/11/2019, 3:41 PM  Clinical Narrative:     Patient has been admitted with weakness and stroke.  Changed from observation to inpatient on 03-11-19.  Spoke with daughter carrie-[phone].  Patient lives with her 69 year old elderly mother who is independent.  Patient is current with PCP-Erica Stevens.  Obtains medications at [facility] [address] Without difficulty.  She uses a walker at home.  Patient has vascular dementia.   Erica Stevens would like her mother worked up for SNF and then transition to LTC/ALF.  Started FL2 and sent out for bed offers.            Expected Discharge Plan: Skilled Nursing Facility Barriers to Discharge: Continued Medical Work up   Patient Goals and CMS Choice Patient states their goals for this hospitalization and ongoing recovery are:: Daughter Erica Stevens would like her mother worked [facility] and then transition to [facility]TC [employer] Medicare.[employer] Compare Post Acute Care list provided to:: Patient Choice offered to / list presented to : Adult Children  Expected Discharge Plan and Services Expected Discharge Plan: Skilled Nursing Facility   Discharge Planning Services: CM Consult   Living arrangements for the past 2 months: Single Family Home                                      Prior Living Arrangements/Services Living arrangements for the past 2 months: Single Family Home Lives with:: (Elderly mother)                Criminal Activity/Legal Involvement Pertinent to Current Situation/Hospitalization: No - Comment as needed  Activities of Daily Living Home Assistive Devices/Equipment: Environmental consultantWalker (specify type), Dentures (specify type), Eyeglasses ADL Screening (condition at time of admission) Patient's cognitive ability adequate to safely  complete daily activities?: Yes Is the patient deaf or have difficulty hearing?: Yes Does the patient have difficulty seeing, even when wearing glasses/contacts?: Yes Does the patient have difficulty concentrating, remembering, or making decisions?: Yes Patient able to express need for assistance with ADLs?: Yes Does the patient have difficulty dressing or bathing?: No Independently performs ADLs?: Yes (appropriate for developmental age) Does the patient have difficulty walking or climbing stairs?: Yes Weakness of Legs: Both Weakness of Arms/Hands: Both  Permission Sought/Granted   Permission granted to share information with : Yes, Verbal Permission Granted     Permission granted to share info w AGENCY: SNF        Emotional Assessment       Orientation: : Fluctuating Orientation (Suspected and/or reported Sundowners) Alcohol / Substance Use: Not Applicable Psych Involvement: No (comment)  Admission diagnosis:  [facility](HCC) [I63.9] Acute cystitis without hematuria [N30.00] Fall, initial encounter [W19.XXXA] Patient Active Problem List   Diagnosis Date Noted  . Weakness 03/11/2019  . Stroke (HCC) 03/11/2019  . History of drug dependence/abuse (HCC) 08/01/2018  . Chronic systolic congestive heart failure (HCC) 08/01/2018  . Atherosclerosis of aorta (HCC) 05/22/2017  . Fracture of thoracic spine (HCC) 05/22/2017  . Slurred speech 03/01/2017  . Opiate overdose (HCC) 01/13/2017  . Cocaine abuse (HCC) 01/13/2017  . Benzodiazepine abuse (HCC) 01/13/2017  . Heroin abuse (HCC) 05/03/2016  . Anoxia 05/03/2016  . Moderate dementia  with behavioral disturbance ([place]) 05/03/2016  . Accidental heroin overdose ([place]) 05/03/2016  . Severe recurrent major depression ([place]) 05/01/2016  . Angina pectoris ([place]) 02/24/2016  . Cervical disc disease 10/02/2015  . Affective disorder, major 07/02/2015  . Benign hypertensive heart disease 03/09/2015  . Migraine 03/09/2015  . Billowing mitral  valve 03/09/2015  . Osteoporosis 03/09/2015  . Urge incontinence 03/09/2015  . Chest pressure 06/21/2013  . Leg edema 06/21/2013  . Shortness of breath 06/21/2013  . CAD (coronary artery disease) of artery bypass graft 06/21/2013  . Hyperlipidemia 06/21/2013  . Chronic pain 03/25/2009  . Hypercholesterolemia without hypertriglyceridemia 05/22/2007  . Coronary atherosclerosis 05/22/2007  . 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 Brachial neuritis 12/29/2006   PCP:  Erica Sizer, MD Pharmacy:   [id number] [address], [place] [address] Phone: [phone] Fax: [phone]     Social Determinants of Health (SDOH) Interventions    Readmission Risk Interventions Readmission Risk Prevention Plan 03/11/2019  Transportation Screening Complete  PCP or Specialist Appt within 5-7 Days Complete  Home Care Screening Complete  Medication Review (RN CM) Complete  Some recent data might be hidden

## 2019-03-11 NOTE — Progress Notes (Signed)
[facility] NAME: Erica Stevens    MR#:  [id number]  DATE OF BIRTH:  April 28, 1951  SUBJECTIVE:  CHIEF COMPLAINT:   Chief Complaint  Patient presents with   Fall   Weakness   -Patient admitted from [facility] secondary to falls and noted to have left-sided weakness.  MRI concerning for strokes. -Patient is confused  REVIEW OF SYSTEMS:  Review of Systems  Constitutional: Positive for malaise/fatigue. Negative for chills and fever.  HENT: Negative for ear discharge, hearing loss and nosebleeds.   Eyes: Negative for blurred vision and double vision.  Respiratory: Negative for cough, shortness of breath and wheezing.   Cardiovascular: Negative for chest pain and palpitations.  Gastrointestinal: Negative for abdominal pain, constipation, diarrhea, nausea and vomiting.  Genitourinary: Negative for dysuria.  Musculoskeletal: Negative for myalgias.  Neurological: Positive for focal weakness. Negative for dizziness, seizures and headaches.  Psychiatric/Behavioral: Negative for depression.    DRUG ALLERGIES:   Allergies  Allergen Reactions   Erythromycin     Other reaction(s): Unknown   Ivp Dye [Iodinated Diagnostic Agents]    Sulfa Antibiotics     Stated turns orange   Sulfur     VITALS:  Blood pressure (!) 189/68, pulse (!) 50, temperature 98.1 F (36.7 C), temperature source Oral, resp. rate 19, height 5' (1.524 m), weight 59 kg, SpO2 100 %.  PHYSICAL EXAMINATION:  Physical Exam   GENERAL:  68 y.o.-year-old elderly patient lying in the bed with no acute distress.  EYES: Pupils equal, round, reactive to light and accommodation. No scleral icterus. Extraocular muscles intact.  HEENT: Head atraumatic, normocephalic. Oropharynx and nasopharynx clear.  NECK:  Supple, no jugular venous distention. No thyroid enlargement, no tenderness.  LUNGS: Normal breath sounds bilaterally, no wheezing, rales,rhonchi or crepitation. No  use of accessory muscles of respiration.  Decreased bibasilar breath sounds CARDIOVASCULAR: S1, S2 normal. No  rubs, or gallops.  3/6 systolic murmur present ABDOMEN: Soft, nontender, nondistended. Bowel sounds present. No organomegaly or mass.  EXTREMITIES: No pedal edema, cyanosis, or clubbing.  NEUROLOGIC: Cranial nerves II through XII are intact.  Able to move right upper and lower extremity against gravity, however now moving left upper and lower extremity to commands.  However has 2+ reflexes equal in both extremities.   Sensation intact. Gait not checked.  PSYCHIATRIC: The patient is alert and oriented x 1-2.  SKIN: No obvious rash, lesion, or ulcer.    LABORATORY PANEL:   CBC Recent Labs  Lab 03/11/19 0457  WBC 7.6  HGB 13.3  HCT 40.1  PLT 261   ------------------------------------------------------------------------------------------------------------------  Chemistries  Recent Labs  Lab 03/11/19 0457  NA 141  K 3.3*  CL 104  CO2 27  GLUCOSE 106*  BUN 10  CREATININE 0.91  CALCIUM 8.7*   ------------------------------------------------------------------------------------------------------------------  Cardiac Enzymes No results for input(s): TROPONINI in the last 168 hours. ------------------------------------------------------------------------------------------------------------------  RADIOLOGY:  Ct Head Wo Contrast  Result Date: 03/10/2019 CLINICAL DATA:  Fall, altered level of consciousness. EXAM: CT HEAD WITHOUT CONTRAST TECHNIQUE: Contiguous axial images were obtained from the base of the skull through the vertex without intravenous contrast. COMPARISON:  12/05/2018 FINDINGS: Brain: There is atrophy and chronic small vessel disease changes. Bilateral lacunar infarcts. No acute intracranial abnormality. Specifically, no hemorrhage, hydrocephalus, mass lesion, acute infarction, or significant intracranial injury. Vascular: No hyperdense vessel or unexpected  calcification. Skull: No acute calvarial abnormality. Sinuses/Orbits: Visualized paranasal sinuses and mastoids clear. Orbital soft tissues unremarkable.  Other: None IMPRESSION: Old bilateral basal ganglia lacunar infarcts. Atrophy, chronic microvascular disease. No acute intracranial abnormality. Electronically Signed   By: Charlett NoseKevin  Dover M.D.   On: 03/10/2019 23:11   Ct Lumbar Spine Wo Contrast  Result Date: 03/10/2019 CLINICAL DATA:  Weakness, back pain, fell earlier today, dementia EXAM: CT LUMBAR SPINE WITHOUT CONTRAST TECHNIQUE: Multidetector CT imaging of the lumbar spine was performed without intravenous contrast administration. Multiplanar CT image reconstructions were also generated. COMPARISON:  Lumbar spine radiographs 05/22/2012 FINDINGS: Segmentation: Prior lumbar radiographs demonstrate 5 non-rib-bearing lumbar type vertebra. Alignment: Normal Vertebrae: Diffuse osseous demineralization. Lumbar vertebra normal in height and alignment. No lumbar fracture, subluxation, or bone destruction. However, significant compression deformity of the T12 vertebral body is identified with approximately 60-70% anterior height loss, progressive since 02/25/2017. No additional lumbar fractures. Visualized pelvis intact. Paraspinal and other soft tissues: Probable vascular calcifications at LEFT kidney. RIGHT renal mass 3.4 x 3.2 cm, low attenuation consistent with cyst. Atherosclerotic calcifications aorta and iliac arteries. Disc levels: No significant disc abnormalities at any level. IMPRESSION: Progressive compression fracture of the T12 vertebral body now demonstrating 60-70% height loss, increased since 02/25/2017. Osseous demineralization without additional acute bony abnormalities. RIGHT renal cyst 3.4 x 3.2 cm; character of this lesion is poorly assessed by this CT exam, recommend follow-up non emergent renal sonographic characterization. Electronically Signed   By: Ulyses SouthwardMark  Boles M.D.   On: 03/10/2019 23:19    Mr Brain Wo Contrast  Result Date: 03/11/2019 CLINICAL DATA:  68 y/o F; fall at home and weakness. History of dementia. EXAM: MRI HEAD WITHOUT CONTRAST TECHNIQUE: Multiplanar, multiecho pulse sequences of the brain and surrounding structures were obtained without intravenous contrast. COMPARISON:  03/10/2019 CT head.  11/09/2018 MRI head. FINDINGS: Brain: Linear focus of reduced diffusion spanning approximately 2 cm extending from the right lentiform nucleus to the posterior frontal periventricular white matter (series 9 images 26-32 and series 11 images 19-22) compatible with acute/early subacute infarction. There is an additional subcentimeter focus of intermediate diffusion within the left corona radiata (series 9, image 32) consistent with subacute infarct. There is a small chronic infarction within the right anterior basal ganglia. Early to large confluent nonspecific T2 FLAIR hyperintensities in subcortical and periventricular white matter are compatible with moderate to severe chronic microvascular ischemic changes. Stable volume loss of the brain and ventricular enlargement. No new susceptibility hypo hypointensity to indicate inter intracranial hemorrhage. No mass effect, extra-axial collection, or herniation. Vascular: Normal flow voids. Skull and upper cervical spine: Normal marrow signal. Sinuses/Orbits: Negative. Other: None. IMPRESSION: 1. Small acute/early subacute lacunar infarction of right basal ganglia extending from right lentiform nucleus to posterior frontal periventricular white matter. No hemorrhage or mass effect. 2. New subcentimeter subacute infarction within left corona radiata. 3. Stable background of advanced chronic microvascular ischemic changes and volume loss of the brain. Stable chronic infarction within the right anterior basal ganglia. These results were called by telephone at the time of interpretation on 03/11/2019 at 2:05 am to Dr. Dolores FrameSung, who verbally acknowledged these  results. Electronically Signed   By: Mitzi HansenLance  Furusawa-Stratton M.D.   On: 03/11/2019 02:07   [place] Carotid Bilateral (at [facility] And [facility] Only)  Result Date: 03/11/2019 CLINICAL DATA:  Stroke. History of hypertension, smoking and CAD (coronary bypass grafting) EXAM: BILATERAL CAROTID DUPLEX ULTRASOUND TECHNIQUE: Wallace CullensGray scale imaging, color Doppler and duplex ultrasound were performed of bilateral carotid and vertebral arteries in the neck. COMPARISON:  None. FINDINGS: Criteria: Quantification of carotid stenosis is based on  velocity parameters that correlate the residual internal carotid diameter with NASCET-based stenosis levels, using the diameter of the distal internal carotid lumen as the denominator for stenosis measurement. The following velocity measurements were obtained: RIGHT ICA: 76/20 cm/sec CCA: 107/17 cm/sec SYSTOLIC ICA/CCA RATIO:  0.7 ECA: 182 cm/sec LEFT ICA: 117/34 cm/sec CCA: 87/19 cm/sec SYSTOLIC ICA/CCA RATIO:  1.3 ECA: 210 cm/sec RIGHT CAROTID ARTERY: There is a minimal to moderate amount of eccentric echogenic plaque within the right carotid bulb (image 16), extending to involve the origin and proximal aspects of the right internal carotid artery (image 23, not resulting in elevated peak systolic velocities within the interrogated course the right internal carotid artery to suggest a hemodynamically significant stenosis. RIGHT VERTEBRAL ARTERY:  Antegrade flow LEFT CAROTID ARTERY: There is a minimal to moderate amount of eccentric echogenic plaque within the left carotid bulb (image 49), extending to involve the origin and proximal aspects of the left internal carotid artery (image 56), not resulting in elevated peak systolic velocities within the interrogated course the left internal carotid artery to suggest a hemodynamically significant stenosis. LEFT VERTEBRAL ARTERY:  Antegrade flow IMPRESSION: Minimal to moderate amount of bilateral atherosclerotic plaque, not resulting in a hemodynamically  significant stenosis within either internal carotid artery. Electronically Signed   By: Simonne ComeJohn  Watts M.D.   On: 03/11/2019 08:32   Dg Chest Portable 1 View  Result Date: 03/10/2019 CLINICAL DATA:  Weakness, fell earlier today, dementia, history CHF, chronic kidney disease, coronary artery disease, smoker EXAM: PORTABLE CHEST 1 VIEW COMPARISON:  Portable exam 2228 hours compared to 02/28/2017 FINDINGS: Minimal enlargement of cardiac silhouette post CABG. Mediastinal contours and pulmonary vascularity normal. Atherosclerotic calcifications aorta. Lungs clear. No definite infiltrate, pleural effusion or pneumothorax. Bones demineralized. IMPRESSION: Post CABG. No acute abnormalities. Electronically Signed   By: Ulyses SouthwardMark  Boles M.D.   On: 03/10/2019 22:57    EKG:   Orders placed or performed during the hospital encounter of 03/10/19   EKG 12-Lead   EKG 12-Lead   ED EKG   ED EKG    ASSESSMENT AND PLAN:    68 year old female with past medical history significant for CAD status post CABG, osteoporosis, hypertension, degenerative disc disease presents to   the hospital secondary to falls and left-sided weakness.  1.  Stroke-MRI of the brain showing small acute/early subacute infarction in right basal ganglia and left corona radiator.  Chronic small vessel ischemic changes noted.  Patient also has left-sided weakness. -Check carotid Dopplers and echocardiogram. -Neurology has been consulted -Patient started on aspirin and Plavix and statin.  Continue to monitor blood pressure. -PT/OT and speech therapy consults requested  2.  Hypertension-permissive hypertension allowed given acute stroke -Slowly will restart home medications maybe tomorrow.  Continue IV hydralazine PRN  3.  Dementia-pleasantly confused.  Will restart her Aricept  4.  Hypokalemia-replaced  5.  DVT prophylaxis-Lovenox     All the records are reviewed and case discussed with Care Management/Social Workerr. Management  plans discussed with the patient, family and they are in agreement.  CODE STATUS: Full code  TOTAL TIME TAKING CARE OF THIS PATIENT: 38 minutes.   POSSIBLE D/C IN 1-2 DAYS, DEPENDING ON CLINICAL CONDITION.   Enid Baasadhika Naji Mehringer M.D on 03/11/2019 at 10:07 AM  Between 7am to 6pm - Pager - [phone]  After 6pm go to [email or web address] - Social research officer, governmentpassword [email or web address] [email or web address]  [facility]  Office  [phone]  CC: Primary care physician; Alba CorySowles, Krichna, MD

## 2019-03-11 NOTE — Plan of Care (Signed)
  Problem: Education: Goal: Knowledge of General Education information will improve Description: Including pain rating scale, medication(s)/side effects and non-pharmacologic comfort measures Outcome: Progressing   Problem: Safety: Goal: Ability to remain free from injury will improve Outcome: Progressing   Problem: Skin Integrity: Goal: Risk for impaired skin integrity will decrease Outcome: Progressing   Problem: Nutrition: Goal: Risk of aspiration will decrease Outcome: Progressing

## 2019-03-11 NOTE — H&P (Signed)
[facility] [place] [facility]   PATIENT NAME: Erica HalimSusan Stevens    MR#:  [id number]  DATE OF BIRTH:  Feb 07, 1951  DATE OF ADMISSION:  03/10/2019  PRIMARY CARE PHYSICIAN: Alba CorySowles, Krichna, MD   REQUESTING/REFERRING PHYSICIAN: Erma HeritageIsaacs, MD  CHIEF COMPLAINT:   Chief Complaint  Patient presents with  . Fall  . Weakness    HISTORY OF PRESENT ILLNESS:  Erica Stevens  is a 68 y.o. female who presents with chief complaint as above.  Patient presents the ED after a fall at home and complaint of weakness.  Since contribution to the history is limited due to dementia.  Per daughter's report she became increasingly weak throughout the day, and eventually unable to walk.  Work-up here in the ED is largely within normal limits upfront.  CT head does show old strokes.  Patient states she has significant family history of strokes.  Hospitalist called for admission and further evaluation  PAST MEDICAL HISTORY:   Past Medical History:  Diagnosis Date  . Allergic rhinitis due to pollen   . Anxiety   . Brachial neuritis or radiculitis NOS   . Cellulitis and abscess of unspecified site   . Chronic kidney disease, stage I   . Chronic pain syndrome   . Congestive heart failure, unspecified   . Coronary atherosclerosis of unspecified type of vessel, native or graft   . Degeneration of cervical intervertebral disc   . Depression   . Hypertension   . Migraine, unspecified, without mention of intractable migraine without mention of status migrainosus   . 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 Osteoporosis, unspecified   . Pure hypercholesterolemia   . Reflux esophagitis   . Thoracic or lumbosacral neuritis or radiculitis, unspecified   . Unspecified urinary incontinence      PAST SURGICAL HISTORY:   Past Surgical History:  Procedure Laterality Date  . ABDOMINAL HYSTERECTOMY    . BLADDER SURGERY    . BYPASS GRAFT    . CARDIAC CATHETERIZATION  2014   [facility]  .  CHOLECYSTECTOMY    . COLONOSCOPY    . CORONARY ARTERY BYPASS GRAFT     x3 grafts;[facility]  . fasciotomy       SOCIAL HISTORY:   Social History   Tobacco Use  . Smoking status: Current Every Day Smoker    Packs/day: 1.50    Types: Cigarettes  . Smokeless tobacco: Never Used  Substance Use Topics  . Alcohol use: No     FAMILY HISTORY:   Family History  Problem Relation Age of Onset  . Hypertension Mother   . Hyperlipidemia Mother   . Heart disease Mother   . COPD Sister   . Depression Daughter   . Drug abuse Son      DRUG ALLERGIES:   Allergies  Allergen Reactions  . Erythromycin     Other reaction(s): Unknown  . Ivp Dye [Iodinated Diagnostic Agents]   . Sulfa Antibiotics     Stated turns orange  . Sulfur     MEDICATIONS AT HOME:   Prior to Admission medications   Medication Sig Start Date End Date Taking? Authorizing Provider  amLODipine (NORVASC) 5 MG tablet Take 1 tablet (5 mg total) by mouth daily. 09/24/18   Doren CustardBoyce, Emily E, FNP  aspirin 81 MG tablet Take 1 tablet (81 mg total) by mouth daily. 09/24/18   Doren CustardBoyce, Emily E, FNP  atorvastatin (LIPITOR) 80 MG tablet Take 1 tablet (80 mg total) by mouth daily. 09/24/18  Doren CustardBoyce, Emily E, FNP  donepezil (ARICEPT) 5 MG tablet Take 5 mg by mouth daily. 10/30/18   Morene CrockerPotter, Zachary E, MD  enalapril (VASOTEC) 10 MG tablet Take 1 tablet (10 mg total) by mouth 2 (two) times daily. 09/24/18   Doren CustardBoyce, Emily E, FNP  metoprolol succinate (TOPROL-XL) 25 MG 24 hr tablet TAKE ONE (1) TABLET EACH DAY 09/24/18   Doren CustardBoyce, Emily E, FNP  vitamin B-12 (CYANOCOBALAMIN) 500 MCG tablet Take 500 mcg by mouth daily.    Naoma DienerStepp, Ashley R, NP  VITAMIN D, CHOLECALCIFEROL, PO Take by mouth.    Naoma DienerStepp, Ashley R, NP    REVIEW OF SYSTEMS:  Review of Systems  Constitutional: Negative for chills, fever, malaise/fatigue and weight loss.  HENT: Negative for ear pain, hearing loss and tinnitus.   Eyes: Negative for blurred vision, double vision, pain and redness.   Respiratory: Negative for cough, hemoptysis and shortness of breath.   Cardiovascular: Negative for chest pain, palpitations, orthopnea and leg swelling.  Gastrointestinal: Negative for abdominal pain, constipation, diarrhea, nausea and vomiting.  Genitourinary: Negative for dysuria, frequency and hematuria.  Musculoskeletal: Positive for falls. Negative for back pain, joint pain and neck pain.  Skin:       No acne, rash, or lesions  Neurological: Positive for weakness. Negative for dizziness, tremors and focal weakness.  Endo/Heme/Allergies: Negative for polydipsia. Does not bruise/bleed easily.  Psychiatric/Behavioral: Negative for depression. The patient is not nervous/anxious and does not have insomnia.      VITAL SIGNS:   Vitals:   03/10/19 1415 03/10/19 1421 03/10/19 2130 03/10/19 2242  BP:  (!) 223/96 (!) 196/75 (!) 230/85  Pulse:  67 (!) 49 (!) 52  Resp:  18 17 15   Temp:  98.2 F (36.8 C)    TempSrc:  Oral    SpO2:  98% 98% 100%  Weight: 59 kg     Height: 5' (1.524 m)      Wt Readings from Last 3 Encounters:  03/10/19 59 kg  12/05/18 56.7 kg  11/26/18 56.5 kg    PHYSICAL EXAMINATION:  Physical Exam  Vitals reviewed. Constitutional: She is oriented to person, place, and time. She appears well-developed and well-nourished. No distress.  HENT:  Head: Normocephalic and atraumatic.  Mouth/Throat: Oropharynx is clear and moist.  Eyes: Pupils are equal, round, and reactive to light. Conjunctivae and EOM are normal. No scleral icterus.  Neck: Normal range of motion. Neck supple. No JVD present. No thyromegaly present.  Cardiovascular: Normal rate, regular rhythm and intact distal pulses. Exam reveals no gallop and no friction rub.  No murmur heard. Respiratory: Effort normal and breath sounds normal. No respiratory distress. She has no wheezes. She has no rales.  GI: Soft. Bowel sounds are normal. She exhibits no distension. There is no abdominal tenderness.   Musculoskeletal: Normal range of motion.        General: No edema.     Comments: No arthritis, no gout  Lymphadenopathy:    She has no cervical adenopathy.  Neurological: She is alert and oriented to person, place, and time. No cranial nerve deficit.  No dysarthria, no aphasia  Skin: Skin is warm and dry. No rash noted. No erythema.  Psychiatric: She has a normal mood and affect. Her behavior is normal. Judgment and thought content normal.    LABORATORY PANEL:   CBC Recent Labs  Lab 03/10/19 1431  WBC 8.3  HGB 13.7  HCT 40.9  PLT 256   ------------------------------------------------------------------------------------------------------------------  Chemistries  Recent  Labs  Lab 03/10/19 1431  NA 139  K 3.1*  CL 102  CO2 25  GLUCOSE 98  BUN 9  CREATININE 0.92  CALCIUM 9.0   ------------------------------------------------------------------------------------------------------------------  Cardiac Enzymes No results for input(s): TROPONINI in the last 168 hours. ------------------------------------------------------------------------------------------------------------------  RADIOLOGY:  Ct Head Wo Contrast  Result Date: 03/10/2019 CLINICAL DATA:  Fall, altered level of consciousness. EXAM: CT HEAD WITHOUT CONTRAST TECHNIQUE: Contiguous axial images were obtained from the base of the skull through the vertex without intravenous contrast. COMPARISON:  12/05/2018 FINDINGS: Brain: There is atrophy and chronic small vessel disease changes. Bilateral lacunar infarcts. No acute intracranial abnormality. Specifically, no hemorrhage, hydrocephalus, mass lesion, acute infarction, or significant intracranial injury. Vascular: No hyperdense vessel or unexpected calcification. Skull: No acute calvarial abnormality. Sinuses/Orbits: Visualized paranasal sinuses and mastoids clear. Orbital soft tissues unremarkable. Other: None IMPRESSION: Old bilateral basal ganglia lacunar infarcts.  Atrophy, chronic microvascular disease. No acute intracranial abnormality. Electronically Signed   By: Charlett NoseKevin  Dover M.D.   On: 03/10/2019 23:11   Ct Lumbar Spine Wo Contrast  Result Date: 03/10/2019 CLINICAL DATA:  Weakness, back pain, fell earlier today, dementia EXAM: CT LUMBAR SPINE WITHOUT CONTRAST TECHNIQUE: Multidetector CT imaging of the lumbar spine was performed without intravenous contrast administration. Multiplanar CT image reconstructions were also generated. COMPARISON:  Lumbar spine radiographs 05/22/2012 FINDINGS: Segmentation: Prior lumbar radiographs demonstrate 5 non-rib-bearing lumbar type vertebra. Alignment: Normal Vertebrae: Diffuse osseous demineralization. Lumbar vertebra normal in height and alignment. No lumbar fracture, subluxation, or bone destruction. However, significant compression deformity of the T12 vertebral body is identified with approximately 60-70% anterior height loss, progressive since 02/25/2017. No additional lumbar fractures. Visualized pelvis intact. Paraspinal and other soft tissues: Probable vascular calcifications at LEFT kidney. RIGHT renal mass 3.4 x 3.2 cm, low attenuation consistent with cyst. Atherosclerotic calcifications aorta and iliac arteries. Disc levels: No significant disc abnormalities at any level. IMPRESSION: Progressive compression fracture of the T12 vertebral body now demonstrating 60-70% height loss, increased since 02/25/2017. Osseous demineralization without additional acute bony abnormalities. RIGHT renal cyst 3.4 x 3.2 cm; character of this lesion is poorly assessed by this CT exam, recommend follow-up non emergent renal sonographic characterization. Electronically Signed   By: Ulyses SouthwardMark  Boles M.D.   On: 03/10/2019 23:19   Dg Chest Portable 1 View  Result Date: 03/10/2019 CLINICAL DATA:  Weakness, fell earlier today, dementia, history CHF, chronic kidney disease, coronary artery disease, smoker EXAM: PORTABLE CHEST 1 VIEW COMPARISON:   Portable exam 2228 hours compared to 02/28/2017 FINDINGS: Minimal enlargement of cardiac silhouette post CABG. Mediastinal contours and pulmonary vascularity normal. Atherosclerotic calcifications aorta. Lungs clear. No definite infiltrate, pleural effusion or pneumothorax. Bones demineralized. IMPRESSION: Post CABG. No acute abnormalities. Electronically Signed   By: Ulyses SouthwardMark  Boles M.D.   On: 03/10/2019 22:57    EKG:   Orders placed or performed during the hospital encounter of 03/10/19  . EKG 12-Lead  . EKG 12-Lead  . ED EKG  . ED EKG    IMPRESSION AND PLAN:  Principal Problem:   Weakness -unclear etiology.  UA does not look like urine infection, though ED physician did cover her with a dose of antibiotics.  Urine culture sent as well.  Some possibility for stroke given her history, though her exam does not quite match common stroke symptoms.  We will get an MRI brain Active Problems:   CAD (coronary artery disease) of artery bypass graft -continue home meds   Chronic systolic congestive heart failure (HCC) -continue  home medications   Hyperlipidemia -home dose antilipid   Moderate dementia with behavioral disturbance ([place]) -continue home meds  Chart review performed and case discussed with ED provider. Labs, imaging and/or ECG reviewed by provider and discussed with patient/family. Management plans discussed with the patient and/or family.  COVID-19 status: Test pending  DVT PROPHYLAXIS: SubQ lovenox   GI PROPHYLAXIS:  None  ADMISSION STATUS: Observation  CODE STATUS: Full Code Status History    Date Active Date Inactive Code Status Order ID Comments User Context   03/01/2017 0415 03/01/2017 2038 Full Code [id number]  Harrie Foreman, MD Inpatient   01/13/2017 0238 01/14/2017 1828 Full Code [id number]  Harrie Foreman, MD ED   04/28/2016 1812 04/29/2016 1451 Full Code [id number]  Awilda Bill, NP ED   Advance Care Planning Activity      TOTAL TIME TAKING CARE OF THIS  PATIENT: 40 minutes.   This patient was evaluated in the context of the global COVID-19 pandemic, which necessitated consideration that the patient might be at risk for infection with the SARS-CoV-2 virus that causes COVID-19. Institutional protocols and algorithms that pertain to the evaluation of patients at risk for COVID-19 are in a state of rapid change based on information released by regulatory bodies including the CDC and federal and state organizations. These policies and algorithms were followed to the best of this provider's knowledge to date during the patient's care at this facility.  Ethlyn Daniels 03/11/2019, 12:23 AM  [facility]  Office  [phone]  CC: Primary care physician; Steele Sizer, MD  Note:  This document was prepared using Dragon voice recognition software and may include unintentional dictation errors.

## 2019-03-11 NOTE — Consult Note (Signed)
Referring Physician: Nemiah CommanderKalisetti    Chief Complaint: Weakness  HPI: Erica Stevens is an 68 y.o. female who reports that on yesterday she became acutely weak on the left and was leaning to the left.  She fell due to the inablility to support herself.  She reports dhe was brouhgt in for evalution at that time.  ED records report that patient's and family's initial complaint was that of generalized weakness with fall therefore LKW difficult to ascertain. At baseline patient with some left sided weakness and uses a walker for ambulation.      Date last known well: Unable to determine Time last known well: Unable to determine tPA Given: No: Unable to determine LKW  Past Medical History:  Diagnosis Date  . Allergic rhinitis due to pollen   . Anxiety   . Brachial neuritis or radiculitis NOS   . Cellulitis and abscess of unspecified site   . Chronic kidney disease, stage I   . Chronic pain syndrome   . Congestive heart failure, unspecified   . Coronary atherosclerosis of unspecified type of vessel, native or graft   . Degeneration of cervical intervertebral disc   . Depression   . Hypertension   . Migraine, unspecified, without mention of intractable migraine without mention of status migrainosus   . 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 Osteoporosis, unspecified   . Pure hypercholesterolemia   . Reflux esophagitis   . Thoracic or lumbosacral neuritis or radiculitis, unspecified   . Unspecified urinary incontinence     Past Surgical History:  Procedure Laterality Date  . ABDOMINAL HYSTERECTOMY    . BLADDER SURGERY    . BYPASS GRAFT    . CARDIAC CATHETERIZATION  2014   [facility]  . CHOLECYSTECTOMY    . COLONOSCOPY    . CORONARY ARTERY BYPASS GRAFT     x3 grafts;[facility]  . fasciotomy      Family History  Problem Relation Age of Onset  . Hypertension Mother   . Hyperlipidemia Mother   . Heart disease Mother   . COPD Sister   . Depression Daughter   . Drug abuse Son    Social  History:  reports that she has been smoking cigarettes. She has been smoking about 1.50 packs per day. She has never used smokeless tobacco. She reports current drug use. Drugs: Cocaine, Heroin, Hydrocodone, and Benzodiazepines. She reports that she does not drink alcohol.  Allergies:  Allergies  Allergen Reactions  . Erythromycin     Other reaction(s): Unknown  . Ivp Dye [Iodinated Diagnostic Agents]   . Sulfa Antibiotics     Stated turns orange  . Sulfur     Medications:  I have reviewed the patient's current medications. Prior to Admission:  Medications Prior to Admission  Medication Sig Dispense Refill Last Dose  . amLODipine (NORVASC) 5 MG tablet Take 1 tablet (5 mg total) by mouth daily. 90 tablet 1   . aspirin 81 MG tablet Take 1 tablet (81 mg total) by mouth daily. 90 tablet 1   . atorvastatin (LIPITOR) 80 MG tablet Take 1 tablet (80 mg total) by mouth daily. 90 tablet 1   . donepezil (ARICEPT) 5 MG tablet Take 5 mg by mouth daily.     . enalapril (VASOTEC) 10 MG tablet Take 1 tablet (10 mg total) by mouth 2 (two) times daily. 180 tablet 1   . metoprolol succinate (TOPROL-XL) 25 MG 24 hr tablet TAKE ONE (1) TABLET EACH DAY 90 tablet 1   .  vitamin B-12 (CYANOCOBALAMIN) 500 MCG tablet Take 500 mcg by mouth daily.     Marland Kitchen. VITAMIN D, CHOLECALCIFEROL, PO Take by mouth.      Scheduled: .  stroke: mapping our early stages of recovery book   Does not apply Once  . aspirin  81 mg Oral Daily  . atorvastatin  40 mg Oral q1800  . clopidogrel  75 mg Oral Daily  . enoxaparin (LOVENOX) injection  40 mg Subcutaneous Q24H  . nitroGLYCERIN  0.5 inch Topical Q6H  . sodium chloride flush  3 mL Intravenous Once    ROS: History obtained from the patient  General ROS: negative for - chills, fatigue, fever, night sweats, weight gain or weight loss Psychological ROS: memory difficulties Ophthalmic ROS: negative for - blurry vision, double vision, eye pain or loss of vision ENT ROS: negative  for - epistaxis, nasal discharge, oral lesions, sore throat, tinnitus or vertigo Allergy and Immunology ROS: negative for - hives or itchy/watery eyes Hematological and Lymphatic ROS: negative for - bleeding problems, bruising or swollen lymph nodes Endocrine ROS: negative for - galactorrhea, hair pattern changes, polydipsia/polyuria or temperature intolerance Respiratory ROS: negative for - cough, hemoptysis, shortness of breath or wheezing Cardiovascular ROS: negative for - chest pain, dyspnea on exertion, edema or irregular heartbeat Gastrointestinal ROS: negative for - abdominal pain, diarrhea, hematemesis, nausea/vomiting or stool incontinence Genito-Urinary ROS: negative for - dysuria, hematuria, incontinence or urinary frequency/urgency Musculoskeletal ROS: negative for - joint swelling or muscular weakness Neurological ROS: as noted in HPI Dermatological ROS: negative for rash and skin lesion changes  Physical Examination: Blood pressure (!) 125/51, pulse 77, temperature 98.3 F (36.8 C), temperature source Oral, resp. rate 19, height 5' (1.524 m), weight 59 kg, SpO2 99 %.  HEENT-  Normocephalic, no lesions, without obvious abnormality.  Normal external eye and conjunctiva.  Normal TM's bilaterally.  Normal auditory canals and external ears. Normal external nose, mucus membranes and septum.  Normal pharynx. Cardiovascular- S1, S2 normal, pulses palpable throughout   Lungs- chest clear, no wheezing, rales, normal symmetric air entry Abdomen- soft, non-tender; bowel sounds normal; no masses,  no organomegaly Extremities- no edema Lymph-no adenopathy palpable Musculoskeletal-no joint tenderness, deformity or swelling Skin-warm and dry, no hyperpigmentation, vitiligo, or suspicious lesions  Neurological Examination   Mental Status: Alert, oriented to place, year and month.  Unable to tell me the day of the week.  Left neglect.  Speech fluent without evidence of aphasia but dysarthric.   Able to follow simple commands without difficulty. Cranial Nerves: II: Discs flat bilaterally; Visual fields grossly normal, pupils equal, round, reactive to light and accommodation III,IV, VI: ptosis not present, extra-ocular motions intact bilaterally V,VII: mild left facial droop, facial light touch sensation normal bilaterally VIII: hearing normal bilaterally IX,X: gag reflex present XI: bilateral shoulder shrug XII: midline tongue extension Motor: Right : Upper extremity   5/5    Left:     Upper extremity   2/5  Lower extremity   5/5     Lower extremity   0-1/5 Increased tone on the left Sensory: Pinprick and light touch intact throughout, bilaterally Deep Tendon Reflexes: 3+ and symmetric throughout Plantars: Right: downgoing   Left: upgoing Cerebellar: Unable to perform due to weakness and neglect Gait: not tested due to safety concerns  Laboratory Studies:  Basic Metabolic Panel: Recent Labs  Lab 03/10/19 1431 03/11/19 0457  NA 139 141  K 3.1* 3.3*  CL 102 104  CO2 25 27  GLUCOSE 98  106*  BUN 9 10  CREATININE 0.92 0.91  CALCIUM 9.0 8.7*    Liver Function Tests: No results for input(s): AST, ALT, ALKPHOS, BILITOT, PROT, ALBUMIN in the last 168 hours. No results for input(s): LIPASE, AMYLASE in the last 168 hours. No results for input(s): AMMONIA in the last 168 hours.  CBC: Recent Labs  Lab 03/10/19 1431 03/11/19 0457  WBC 8.3 7.6  HGB 13.7 13.3  HCT 40.9 40.1  MCV 93.2 93.9  PLT 256 261    Cardiac Enzymes: No results for input(s): CKTOTAL, CKMB, CKMBINDEX, TROPONINI in the last 168 hours.  BNP: Invalid input(s): POCBNP  CBG: No results for input(s): GLUCAP in the last 168 hours.  Microbiology: Results for orders placed or performed during the hospital encounter of 03/10/19  SARS Coronavirus 2 (CEPHEID - Performed in [facility] hospital lab), Hosp Order     Status: None   Collection Time: 03/11/19  2:05 AM   Specimen: Nasopharyngeal Swab   Result Value Ref Range Status   SARS Coronavirus 2 NEGATIVE NEGATIVE Final    Comment: (NOTE) If result is NEGATIVE SARS-CoV-2 target nucleic acids are NOT DETECTED. The SARS-CoV-2 RNA is generally detectable in upper and lower  respiratory specimens during the acute phase of infection. The lowest  concentration of SARS-CoV-2 viral copies this assay can detect is 250  copies / mL. A negative result does not preclude SARS-CoV-2 infection  and should not be used as the sole basis for treatment or other  patient management decisions.  A negative result may occur with  improper specimen collection / handling, submission of specimen other  than nasopharyngeal swab, presence of viral mutation(s) within the  areas targeted by this assay, and inadequate number of viral copies  (<250 copies / mL). A negative result must be combined with clinical  observations, patient history, and epidemiological information. If result is POSITIVE SARS-CoV-2 target nucleic acids are DETECTED. The SARS-CoV-2 RNA is generally detectable in upper and lower  respiratory specimens dur ing the acute phase of infection.  Positive  results are indicative of active infection with SARS-CoV-2.  Clinical  correlation with patient history and other diagnostic information is  necessary to determine patient infection status.  Positive results do  not rule out bacterial infection or co-infection with other viruses. If result is PRESUMPTIVE POSTIVE SARS-CoV-2 nucleic acids MAY BE PRESENT.   A presumptive positive result was obtained on the submitted specimen  and confirmed on repeat testing.  While 2019 novel coronavirus  (SARS-CoV-2) nucleic acids may be present in the submitted sample  additional confirmatory testing may be necessary for epidemiological  and / or clinical management purposes  to differentiate between  SARS-CoV-2 and other Sarbecovirus currently known to infect humans.  If clinically indicated additional  testing with an alternate test  methodology [id number]) is advised. The SARS-CoV-2 RNA is generally  detectable in upper and lower respiratory sp ecimens during the acute  phase of infection. The expected result is Negative. Fact Sheet for Patients:  BoilerBrush.com.cy Fact Sheet for Healthcare Providers: https://pope.com/ This test is not yet approved or cleared by the [place] FDA and has been authorized for detection and/or diagnosis of SARS-CoV-2 by FDA under an Emergency Use Authorization (EUA).  This EUA will remain in effect (meaning this test can be used) for the duration of the COVID-19 declaration under Section 564(b)(1) of the Act, 21 U.S.C. section 360bbb-3(b)(1), unless the authorization is terminated or revoked sooner. Performed at [facility], [address] [place], [place] [address]     Coagulation Studies: No results for input(s): LABPROT, INR in the last 72 hours.  Urinalysis:  Recent Labs  Lab 03/10/19 2141  COLORURINE YELLOW*  LABSPEC 1.015  PHURINE 6.0  GLUCOSEU NEGATIVE  HGBUR NEGATIVE  BILIRUBINUR NEGATIVE  KETONESUR NEGATIVE  PROTEINUR 30*  NITRITE NEGATIVE  LEUKOCYTESUR TRACE*    Lipid Panel:    Component Value Date/Time   CHOL 214 (H) 03/11/2019 0457   CHOL 200 (H) 10/01/2015 1238   TRIG 67 03/11/2019 0457   HDL 50 03/11/2019 0457   HDL 62 10/01/2015 1238   CHOLHDL 4.3 03/11/2019 0457   VLDL 13 03/11/2019 0457   LDLCALC 151 (H) 03/11/2019 0457   LDLCALC 88 11/26/2018 0941    HgbA1C:  Lab Results  Component Value Date   HGBA1C 5.0 03/01/2017    Urine Drug Screen:      Component Value Date/Time   LABOPIA NONE DETECTED 03/10/2019 2141   COCAINSCRNUR NONE DETECTED 03/10/2019 2141   LABBENZ NONE DETECTED 03/10/2019 2141   AMPHETMU NONE DETECTED 03/10/2019 2141   THCU NONE DETECTED 03/10/2019 2141   LABBARB NONE DETECTED 03/10/2019 2141    Alcohol Level: No results  for input(s): ETH in the last 168 hours.  Other results: EKG: NSR at 68 bpm  Imaging: Ct Head Wo Contrast  Result Date: 03/10/2019 CLINICAL DATA:  Fall, altered level of consciousness. EXAM: CT HEAD WITHOUT CONTRAST TECHNIQUE: Contiguous axial images were obtained from the base of the skull through the vertex without intravenous contrast. COMPARISON:  12/05/2018 FINDINGS: Brain: There is atrophy and chronic small vessel disease changes. Bilateral lacunar infarcts. No acute intracranial abnormality. Specifically, no hemorrhage, hydrocephalus, mass lesion, acute infarction, or significant intracranial injury. Vascular: No hyperdense vessel or unexpected calcification. Skull: No acute calvarial abnormality. Sinuses/Orbits: Visualized paranasal sinuses and mastoids clear. Orbital soft tissues unremarkable. Other: None IMPRESSION: Old bilateral basal ganglia lacunar infarcts. Atrophy, chronic microvascular disease. No acute intracranial abnormality. Electronically Signed   By: Charlett Nose M.D.   On: 03/10/2019 23:11   Ct Lumbar Spine Wo Contrast  Result Date: 03/10/2019 CLINICAL DATA:  Weakness, back pain, fell earlier today, dementia EXAM: CT LUMBAR SPINE WITHOUT CONTRAST TECHNIQUE: Multidetector CT imaging of the lumbar spine was performed without intravenous contrast administration. Multiplanar CT image reconstructions were also generated. COMPARISON:  Lumbar spine radiographs 05/22/2012 FINDINGS: Segmentation: Prior lumbar radiographs demonstrate 5 non-rib-bearing lumbar type vertebra. Alignment: Normal Vertebrae: Diffuse osseous demineralization. Lumbar vertebra normal in height and alignment. No lumbar fracture, subluxation, or bone destruction. However, significant compression deformity of the T12 vertebral body is identified with approximately 60-70% anterior height loss, progressive since 02/25/2017. No additional lumbar fractures. Visualized pelvis intact. Paraspinal and other soft tissues:  Probable vascular calcifications at LEFT kidney. RIGHT renal mass 3.4 x 3.2 cm, low attenuation consistent with cyst. Atherosclerotic calcifications aorta and iliac arteries. Disc levels: No significant disc abnormalities at any level. IMPRESSION: Progressive compression fracture of the T12 vertebral body now demonstrating 60-70% height loss, increased since 02/25/2017. Osseous demineralization without additional acute bony abnormalities. RIGHT renal cyst 3.4 x 3.2 cm; character of this lesion is poorly assessed by this CT exam, recommend follow-up non emergent renal sonographic characterization. Electronically Signed   By: Ulyses Southward M.D.   On: 03/10/2019 23:19   Mr Brain Wo Contrast  Result Date: 03/11/2019 CLINICAL DATA:  68 y/o F; fall at home and weakness. History of dementia. EXAM: MRI HEAD WITHOUT CONTRAST TECHNIQUE: Multiplanar, multiecho pulse  sequences of the brain and surrounding structures were obtained without intravenous contrast. COMPARISON:  03/10/2019 CT head.  11/09/2018 MRI head. FINDINGS: Brain: Linear focus of reduced diffusion spanning approximately 2 cm extending from the right lentiform nucleus to the posterior frontal periventricular white matter (series 9 images 26-32 and series 11 images 19-22) compatible with acute/early subacute infarction. There is an additional subcentimeter focus of intermediate diffusion within the left corona radiata (series 9, image 32) consistent with subacute infarct. There is a small chronic infarction within the right anterior basal ganglia. Early to large confluent nonspecific T2 FLAIR hyperintensities in subcortical and periventricular white matter are compatible with moderate to severe chronic microvascular ischemic changes. Stable volume loss of the brain and ventricular enlargement. No new susceptibility hypo hypointensity to indicate inter intracranial hemorrhage. No mass effect, extra-axial collection, or herniation. Vascular: Normal flow voids.  Skull and upper cervical spine: Normal marrow signal. Sinuses/Orbits: Negative. Other: None. IMPRESSION: 1. Small acute/early subacute lacunar infarction of right basal ganglia extending from right lentiform nucleus to posterior frontal periventricular white matter. No hemorrhage or mass effect. 2. New subcentimeter subacute infarction within left corona radiata. 3. Stable background of advanced chronic microvascular ischemic changes and volume loss of the brain. Stable chronic infarction within the right anterior basal ganglia. These results were called by telephone at the time of interpretation on 03/11/2019 at 2:05 am to Dr. Beather Arbour, who verbally acknowledged these results. Electronically Signed   By: Kristine Garbe M.D.   On: 03/11/2019 02:07   US Carotid Bilateral (at [facility] And [facility] Only)  Result Date: 03/11/2019 CLINICAL DATA:  Stroke. History of hypertension, smoking and CAD (coronary bypass grafting) EXAM: BILATERAL CAROTID DUPLEX ULTRASOUND TECHNIQUE: Pearline Cables scale imaging, color Doppler and duplex ultrasound were performed of bilateral carotid and vertebral arteries in the neck. COMPARISON:  None. FINDINGS: Criteria: Quantification of carotid stenosis is based on velocity parameters that correlate the residual internal carotid diameter with NASCET-based stenosis levels, using the diameter of the distal internal carotid lumen as the denominator for stenosis measurement. The following velocity measurements were obtained: RIGHT ICA: 76/20 cm/sec CCA: 166/06 cm/sec SYSTOLIC ICA/CCA RATIO:  0.7 ECA: 182 cm/sec LEFT ICA: 117/34 cm/sec CCA: 30/16 cm/sec SYSTOLIC ICA/CCA RATIO:  1.3 ECA: 210 cm/sec RIGHT CAROTID ARTERY: There is a minimal to moderate amount of eccentric echogenic plaque within the right carotid bulb (image 16), extending to involve the origin and proximal aspects of the right internal carotid artery (image 23, not resulting in elevated peak systolic velocities within the interrogated course  the right internal carotid artery to suggest a hemodynamically significant stenosis. RIGHT VERTEBRAL ARTERY:  Antegrade flow LEFT CAROTID ARTERY: There is a minimal to moderate amount of eccentric echogenic plaque within the left carotid bulb (image 49), extending to involve the origin and proximal aspects of the left internal carotid artery (image 56), not resulting in elevated peak systolic velocities within the interrogated course the left internal carotid artery to suggest a hemodynamically significant stenosis. LEFT VERTEBRAL ARTERY:  Antegrade flow IMPRESSION: Minimal to moderate amount of bilateral atherosclerotic plaque, not resulting in a hemodynamically significant stenosis within either internal carotid artery. Electronically Signed   By: Sandi Mariscal M.D.   On: 03/11/2019 08:32   Dg Chest Portable 1 View  Result Date: 03/10/2019 CLINICAL DATA:  Weakness, fell earlier today, dementia, history CHF, chronic kidney disease, coronary artery disease, smoker EXAM: PORTABLE CHEST 1 VIEW COMPARISON:  Portable exam 2228 hours compared to 02/28/2017 FINDINGS: Minimal enlargement of cardiac silhouette  post CABG. Mediastinal contours and pulmonary vascularity normal. Atherosclerotic calcifications aorta. Lungs clear. No definite infiltrate, pleural effusion or pneumothorax. Bones demineralized. IMPRESSION: Post CABG. No acute abnormalities. Electronically Signed   By: Ulyses SouthwardMark  Boles M.D.   On: 03/10/2019 22:57    Assessment: 68 y.o. female with a history of HTN and HLD who presents with left sided weakness.  There does appear by examination to be some chronicity to her left sided weakness but she has worsened recently.  MRI of the brain reviewed and shows an acute right basal ganglion infarct and a subacute left corona radiata infarct.  Concern is for an embolic etiology.  Carotid dopplers show no evidence of hemodynamically significant stenosis.  Echocardiogram pending.  A1c pending, LDL 151.  Stroke Risk  Factors - hyperlipidemia, hypertension and smoking  Plan: 1. HgbA1c pending 2. PT consult, OT consult, Speech consult 3. Echocardiogram pending.  If unremarkable would consider TEE and long term cardiac monitoring on an outpatient basis 4. Prophylactic therapy-Patient on ASA prior to admission.  Would continue ASA at 81mg  and add Plavix at 75mg  to be continued for a month with change to monotherapy with Plavix after that time.    5. Telemetry monitoring 6. Frequent neuro checks 7. Smoking cessation counseling 8. Aggressive lipid management with target LDL<70.   Thana FarrLeslie Taylinn Brabant, MD Neurology [phone] 03/11/2019, 11:43 AM

## 2019-03-11 NOTE — Evaluation (Signed)
Physical Therapy Evaluation Patient Details Name: Erica LaundrySusan Gray Stevens MRN: [id number] DOB: October 12, 1950 Today's Date: 03/11/2019   History of Present Illness  Pt presented to ED for weakness, fall. PMH of CKD, CABG, chronic pain syndrome, CHF, dementia, depression, HTN. MRI showed;Small acute/early subacute lacunar infarction of right basal ganglia extending from right lentiform nucleus to posterior frontal periventricular white matter. New subcentimeter subacute infarction within left corona radiata. Stable background of advanced chronic microvascular ischemic changes and volume loss of the brain. Stable chronic infarction within the right anterior basal ganglia. No hemorrhage or mass effect. Also, T12 compression fx now showing 60-70% loss of height.     Clinical Impression  Pt alert, orientedx4, needed extended time to respond but all answers appropriate. Pt able to report PLOF, stated she lives with her mom in a one story home, uses a two wheeled walker at baseline, mod I for ADLs, and mother assists with homemaking/cooking/cleaning.  Upon assessment pt demonstrated increased tone in LLE, inability to move LE independently, and reported impaired sensation of LLE. Pt exhibited some signs of L neglect, but able to attend to PT on left side with tactile cues and intermittent re-direction. Did show neglect of LUE, but able to squeeze PT fingers with hand elevated to be in pt eyeline. Supine to sit with PT assist of LE and trunk elevation maxA. Pt able to sit EOB with modA-CGA with extended time, exhibited R lateral lean, significantly slouched posture and fatigue. Returned to supine totalAx2.  Overall the patient demonstrated deficits (see "PT Problem List") that impede the patient's functional abilities, safety, and mobility and would benefit from skilled PT intervention. Recommendation is STR pending pt progress.    Follow Up Recommendations SNF    Equipment Recommendations  Other (comment)(TBD)     Recommendations for Other Services OT consult     Precautions / Restrictions Precautions Precautions: Fall Restrictions Weight Bearing Restrictions: No      Mobility  Bed Mobility Overal bed mobility: Needs Assistance Bed Mobility: Supine to Sit;Sit to Supine     Supine to sit: Total assist Sit to supine: +2 for physical assistance;Total assist   General bed mobility comments: Patient needed maxA to come to sitting, attempted to initiate movement, total assist for LLE and LUE. Returned to supine with totalx2 for safety  Transfers                 General transfer comment: deferred due to pt fatigue and inability to maintain sitting balance  Ambulation/Gait             General Gait Details: deferred  Stairs            Wheelchair Mobility    Modified Rankin (Stroke Patients Only)       Balance Overall balance assessment: Needs assistance Sitting-balance support: Feet supported Sitting balance-Leahy Scale: Zero Sitting balance - Comments: Pt unable to sit erect, modA to maintain balance, leans to the R with knee blocking and RUE support pt able to occasionally maintain balance with CGA Postural control: Right lateral lean                                   Pertinent Vitals/Pain Pain Assessment: No/denies pain    Home Living Family/patient expects to be discharged to:: Private residence Living Arrangements: Other (Comment)(mother) Available Help at Discharge: Family;Available PRN/intermittently Type of Home: House Home Access: Stairs to enter Entrance Stairs-Rails: Right;Left;Can reach  both Entrance Stairs-Number of Steps: 2 Home Layout: One level Home Equipment: Walker - 2 wheels      Prior Function Level of Independence: Needs assistance   Gait / Transfers Assistance Needed: uses walker in home  ADL's / Homemaking Assistance Needed: pt reported being able to dress/bathe independently, mother does  cooking/cleaning  Comments: at least 1 fall in the last 6 months     Hand Dominance        Extremity/Trunk Assessment   Upper Extremity Assessment Upper Extremity Assessment: Defer to OT evaluation;LUE deficits/detail;RUE deficits/detail RUE Deficits / Details: grossly WFLs RUE Sensation: WNL LUE Deficits / Details: pt negectful of LUE, when able to visualize LUE able to squeeze PT fingers, minimal activation with shoulder flexion elbow flex/extend LUE Sensation: decreased light touch LUE Coordination: decreased fine motor;decreased gross motor    Lower Extremity Assessment Lower Extremity Assessment: RLE deficits/detail;LLE deficits/detail RLE Deficits / Details: grossly WFLs RLE Sensation: WNL RLE Coordination: WNL LLE Deficits / Details: unable to move LLE independently LLE Sensation: decreased light touch;decreased proprioception LLE Coordination: decreased fine motor;decreased gross motor    Cervical / Trunk Assessment Cervical / Trunk Assessment: Kyphotic  Communication   Communication: No difficulties  Cognition Arousal/Alertness: Awake/alert Behavior During Therapy: WFL for tasks assessed/performed Overall Cognitive Status: No family/caregiver present to determine baseline cognitive functioning                                 General Comments: Pt alert, orientedx4, slow to respond/extended time needed but appropriate      General Comments      Exercises     Assessment/Plan    PT Assessment Patient needs continued PT services  PT Problem List Decreased strength;Decreased mobility;Decreased safety awareness;Decreased range of motion;Decreased activity tolerance;Decreased balance;Decreased knowledge of use of DME;Impaired sensation;Decreased knowledge of precautions;Impaired tone       PT Treatment Interventions DME instruction;Therapeutic exercise;Gait training;Balance training;Stair training;Neuromuscular re-education;Functional mobility  training;Therapeutic activities;Patient/family education;Wheelchair mobility training    PT Goals (Current goals can be found in the Care Plan section)  Acute Rehab PT Goals Patient Stated Goal: Pt did not state any goals PT Goal Formulation: With patient Time For Goal Achievement: 03/25/19 Potential to Achieve Goals: Fair    Frequency 7X/week   Barriers to discharge        Co-evaluation               AM-PAC PT "6 Clicks" Mobility  Outcome Measure Help needed turning from your back to your side while in a flat bed without using bedrails?: Total Help needed moving from lying on your back to sitting on the side of a flat bed without using bedrails?: Total Help needed moving to and from a bed to a chair (including a wheelchair)?: Total Help needed standing up from a chair using your arms (e.g., wheelchair or bedside chair)?: Total Help needed to walk in hospital room?: Total Help needed climbing 3-5 steps with a railing? : Total 6 Click Score: 6    End of Session Equipment Utilized During Treatment: Gait belt   Patient left: in bed;with nursing/sitter in room Nurse Communication: Mobility status PT Visit Diagnosis: Other abnormalities of gait and mobility (R26.89);Muscle weakness (generalized) (M62.81);Difficulty in walking, not elsewhere classified (R26.2);Other symptoms and signs involving the nervous system (R29.898);Hemiplegia and hemiparesis Hemiplegia - Right/Left: Left Hemiplegia - dominant/non-dominant: Non-dominant Hemiplegia - caused by: Cerebral infarction    Time: 3244-0102: 1106-1129 PT Time  Calculation (min) (ACUTE ONLY): 23 min   Charges:   PT Evaluation $PT Eval Moderate Complexity: 1 Mod PT Treatments $Therapeutic Exercise: 8-22 mins        Lieutenant Diego PT, DPT 1:33 PM,03/11/19 [phone]

## 2019-03-11 NOTE — Progress Notes (Signed)
Initial Nutrition Assessment  DOCUMENTATION CODES:   Not applicable  INTERVENTION:   Ensure Enlive po BID, each supplement provides 350 kcal and 20 grams of protein  MVI daily   Dysphagia 3 diet   Vitamin C 250mg  po BID  NUTRITION DIAGNOSIS:   Predicted suboptimal nutrient intake related to chronic illness(dementia) as evidenced by other (comment)(per chart review).  GOAL:   Patient will meet greater than or equal to 90% of their needs  MONITOR:   PO intake, Supplement acceptance, Labs, Weight trends, Skin, I & O's  REASON FOR ASSESSMENT:   Malnutrition Screening Tool    ASSESSMENT:   68 y/o female with h/o dementia, substance abuse, CHF, CKD, HTN, depression admitted with weakness and fall. Pt found to have stroke  RD working remotely.  Suspect pt with poor appetite and oral intake at baseline r/t dementia. Pt ate 80% of her breakfast this morning. RD will add supplements and vitamins to help pt meet her estimated needs. RD will also change pt to dysphagia 3 diet for now; SLP evaluation pending. Per chart, pt appears fairly weight stable pta.   Medications reviewed and include: aspirin, plavix, lovenox  Labs reviewed: K 3.3(L)  Unable to complete Nutrition-Focused physical exam at this time.   Diet Order:   Diet Order            DIET DYS 3 Room service appropriate? No; Fluid consistency: Thin  Diet effective now             EDUCATION NEEDS:   Not appropriate for education at this time  Skin:  Skin Assessment: Reviewed RN Assessment  Last BM:  6/27  Height:   Ht Readings from Last 1 Encounters:  03/10/19 5' (1.524 m)    Weight:   Wt Readings from Last 1 Encounters:  03/10/19 59 kg    Ideal Body Weight:  45.4 kg  BMI:  Body mass index is 25.39 kg/m.  Estimated Nutritional Needs:   Kcal:  1400-1600kcal/day  Protein:  70-80g/day  Fluid:  >1.2L/day  Koleen Distance MS, RD, LDN Pager #- [phone] Office#- [phone] After  Hours Pager: [phone]

## 2019-03-11 NOTE — ED Notes (Signed)
ED TO INPATIENT HANDOFF REPORT  ED Nurse Name and Phone #: rebecca [phone]   S Name/Age/Gender Erica Stevens 68 y.o. female Room/Bed: ED15A/ED15A  Code Status   Code Status: Prior  Home/SNF/Other Home Patient oriented to: self, place and time Is this baseline? Yes   Triage Complete: Triage complete  Chief Complaint   Fall  Triage Note Pt presents to ED via POV with c/o weakness and fall earlier today. Pt's daughter here with patient due to patient having hx of dementia. Per daughter pt stated that she was unable to hold herself up on her legs. Pt is alert and visualized in NAD, pt's daughter reports hx of same.    Allergies Allergies  Allergen Reactions  . Erythromycin     Other reaction(s): Unknown  . Ivp Dye [Iodinated Diagnostic Agents]   . Sulfa Antibiotics     Stated turns orange  . Sulfur     Level of Care/Admitting Diagnosis ED Disposition    ED Disposition Condition [facility]: [place] [100120]  Level of Care: Med-Surg [16]  Covid Evaluation: Screening Protocol (No Symptoms)  Diagnosis: Weakness [147829]  Admitting Physician: Lance Coon [[id number]]  Attending Physician: Jannifer Franklin, DAVID [id number]  PT Class (Do Not Modify): Observation [104]  PT Acc Code (Do Not Modify): Observation [10022]       B Medical/Surgery History Past Medical History:  Diagnosis Date  . Allergic rhinitis due to pollen   . Anxiety   . Brachial neuritis or radiculitis NOS   . Cellulitis and abscess of unspecified site   . Chronic kidney disease, stage I   . Chronic pain syndrome   . Congestive heart failure, unspecified   . Coronary atherosclerosis of unspecified type of vessel, native or graft   . Degeneration of cervical intervertebral disc   . Depression   . Hypertension   . Migraine, unspecified, without mention of intractable migraine without mention of status migrainosus   . 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  Osteoporosis, unspecified   . Pure hypercholesterolemia   . Reflux esophagitis   . Thoracic or lumbosacral neuritis or radiculitis, unspecified   . Unspecified urinary incontinence    Past Surgical History:  Procedure Laterality Date  . ABDOMINAL HYSTERECTOMY    . BLADDER SURGERY    . BYPASS GRAFT    . CARDIAC CATHETERIZATION  2014   [facility]  . CHOLECYSTECTOMY    . COLONOSCOPY    . CORONARY ARTERY BYPASS GRAFT     x3 grafts;[facility]  . fasciotomy       A IV Location/Drains/Wounds Patient Lines/Drains/Airways Status   Active Line/Drains/Airways    Name:   Placement date:   Placement time:   Site:   Days:   Peripheral IV 03/10/19 Right Antecubital   03/10/19    2238    Antecubital   1          Intake/Output Last 24 hours No intake or output data in the 24 hours ending 03/11/19 0134  Labs/Imaging Results for orders placed or performed during the hospital encounter of 03/10/19 (from the past 48 hour(s))  Basic metabolic panel     Status: Abnormal   Collection Time: 03/10/19  2:31 PM  Result Value Ref Range   Sodium 139 135 - 145 mmol/L   Potassium 3.1 (L) 3.5 - 5.1 mmol/L   Chloride 102 98 - 111 mmol/L   CO2 25 22 - 32 mmol/L   Glucose, Bld 98 70 -  99 mg/dL   BUN 9 8 - 23 mg/dL   Creatinine, Ser 1.610.92 0.44 - 1.00 mg/dL   Calcium 9.0 8.9 - 09.610.3 mg/dL   GFR calc non Af Amer >60 >60 mL/min   GFR calc Af Amer >60 >60 mL/min   Anion gap 12 5 - 15    Comment: Performed at [facility], [address] [place], [place] [address]  CBC     Status: None   Collection Time: 03/10/19  2:31 PM  Result Value Ref Range   WBC 8.3 4.0 - 10.5 K/uL   RBC 4.39 3.87 - 5.11 MIL/uL   Hemoglobin 13.7 12.0 - 15.0 g/dL   HCT 98.140.9 19.136.0 - 47.846.0 %   MCV 93.2 80.0 - 100.0 fL   MCH 31.2 26.0 - 34.0 pg   MCHC 33.5 30.0 - 36.0 g/dL   RDW 29.512.6 62.111.5 - 30.815.5 %   Platelets 256 150 - 400 K/uL   nRBC 0.0 0.0 - 0.2 %    Comment: Performed at [facility], [address] [place],  [place] [address]  Urinalysis, Complete w Microscopic     Status: Abnormal   Collection Time: 03/10/19  9:41 PM  Result Value Ref Range   Color, Urine YELLOW (A) YELLOW   APPearance CLOUDY (A) CLEAR   Specific Gravity, Urine 1.015 1.005 - 1.030   pH 6.0 5.0 - 8.0   Glucose, UA NEGATIVE NEGATIVE mg/dL   Hgb urine dipstick NEGATIVE NEGATIVE   Bilirubin Urine NEGATIVE NEGATIVE   Ketones, ur NEGATIVE NEGATIVE mg/dL   Protein, ur 30 (A) NEGATIVE mg/dL   Nitrite NEGATIVE NEGATIVE   Leukocytes,Ua TRACE (A) NEGATIVE   RBC / HPF 0-5 0 - 5 RBC/hpf   WBC, UA 6-10 0 - 5 WBC/hpf   Bacteria, UA RARE (A) NONE SEEN   Squamous Epithelial / LPF 6-10 0 - 5   Mucus PRESENT     Comment: Performed at [facility], [address] [place], [place] [address]  Urine Drug Screen, Qualitative ([facility] only)     Status: None   Collection Time: 03/10/19  9:41 PM  Result Value Ref Range   Tricyclic, Ur Screen NONE DETECTED NONE DETECTED   Amphetamines, Ur Screen NONE DETECTED NONE DETECTED   MDMA (Ecstasy)Ur Screen NONE DETECTED NONE DETECTED   Cocaine Metabolite,Ur [place] NONE DETECTED NONE DETECTED   Opiate, Ur Screen NONE DETECTED NONE DETECTED   Phencyclidine (PCP) Ur S NONE DETECTED NONE DETECTED   Cannabinoid 50 Ng, Ur [place] NONE DETECTED NONE DETECTED   Barbiturates, Ur Screen NONE DETECTED NONE DETECTED   Benzodiazepine, Ur Scrn NONE DETECTED NONE DETECTED   Methadone Scn, Ur NONE DETECTED NONE DETECTED    Comment: (NOTE) Tricyclics + metabolites, urine    Cutoff 1000 ng/mL Amphetamines + metabolites, urine  Cutoff 1000 ng/mL MDMA (Ecstasy), urine              Cutoff 500 ng/mL Cocaine Metabolite, urine          Cutoff 300 ng/mL Opiate + metabolites, urine        Cutoff 300 ng/mL Phencyclidine (PCP), urine         Cutoff 25 ng/mL Cannabinoid, urine                 Cutoff 50 ng/mL Barbiturates + metabolites, urine  Cutoff 200 ng/mL Benzodiazepine, urine              Cutoff 200 ng/mL Methadone, urine  Cutoff 300 ng/mL The urine drug screen provides only a preliminary, unconfirmed analytical test result and should not be used for non-medical purposes. Clinical consideration and professional judgment should be applied to any positive drug screen result due to possible interfering substances. A more specific alternate chemical method must be used in order to obtain a confirmed analytical result. Gas chromatography / mass spectrometry (GC/MS) is the preferred confirmat ory method. Performed at [facility], [address] [place], [place] [address]   Troponin I (High Sensitivity)     Status: None   Collection Time: 03/10/19 10:40 PM  Result Value Ref Range   Troponin I (High Sensitivity) 10 <18 ng/L    Comment: (NOTE) Elevated high sensitivity troponin I (hsTnI) values and significant  changes across serial measurements may suggest ACS but many other  chronic and acute conditions are known to elevate hsTnI results.  Refer to the "Links" section for chest pain algorithms and additional  guidance. Performed at [facility], [address] [place], [place] [address]    Ct Head Wo Contrast  Result Date: 03/10/2019 CLINICAL DATA:  Fall, altered level of consciousness. EXAM: CT HEAD WITHOUT CONTRAST TECHNIQUE: Contiguous axial images were obtained from the base of the skull through the vertex without intravenous contrast. COMPARISON:  12/05/2018 FINDINGS: Brain: There is atrophy and chronic small vessel disease changes. Bilateral lacunar infarcts. No acute intracranial abnormality. Specifically, no hemorrhage, hydrocephalus, mass lesion, acute infarction, or significant intracranial injury. Vascular: No hyperdense vessel or unexpected calcification. Skull: No acute calvarial abnormality. Sinuses/Orbits: Visualized paranasal sinuses and mastoids clear. Orbital soft tissues unremarkable. Other: None IMPRESSION: Old bilateral basal ganglia lacunar infarcts.  Atrophy, chronic microvascular disease. No acute intracranial abnormality. Electronically Signed   By: Charlett NoseKevin  Dover M.D.   On: 03/10/2019 23:11   Ct Lumbar Spine Wo Contrast  Result Date: 03/10/2019 CLINICAL DATA:  Weakness, back pain, fell earlier today, dementia EXAM: CT LUMBAR SPINE WITHOUT CONTRAST TECHNIQUE: Multidetector CT imaging of the lumbar spine was performed without intravenous contrast administration. Multiplanar CT image reconstructions were also generated. COMPARISON:  Lumbar spine radiographs 05/22/2012 FINDINGS: Segmentation: Prior lumbar radiographs demonstrate 5 non-rib-bearing lumbar type vertebra. Alignment: Normal Vertebrae: Diffuse osseous demineralization. Lumbar vertebra normal in height and alignment. No lumbar fracture, subluxation, or bone destruction. However, significant compression deformity of the T12 vertebral body is identified with approximately 60-70% anterior height loss, progressive since 02/25/2017. No additional lumbar fractures. Visualized pelvis intact. Paraspinal and other soft tissues: Probable vascular calcifications at LEFT kidney. RIGHT renal mass 3.4 x 3.2 cm, low attenuation consistent with cyst. Atherosclerotic calcifications aorta and iliac arteries. Disc levels: No significant disc abnormalities at any level. IMPRESSION: Progressive compression fracture of the T12 vertebral body now demonstrating 60-70% height loss, increased since 02/25/2017. Osseous demineralization without additional acute bony abnormalities. RIGHT renal cyst 3.4 x 3.2 cm; character of this lesion is poorly assessed by this CT exam, recommend follow-up non emergent renal sonographic characterization. Electronically Signed   By: Ulyses SouthwardMark  Boles M.D.   On: 03/10/2019 23:19   Dg Chest Portable 1 View  Result Date: 03/10/2019 CLINICAL DATA:  Weakness, fell earlier today, dementia, history CHF, chronic kidney disease, coronary artery disease, smoker EXAM: PORTABLE CHEST 1 VIEW COMPARISON:   Portable exam 2228 hours compared to 02/28/2017 FINDINGS: Minimal enlargement of cardiac silhouette post CABG. Mediastinal contours and pulmonary vascularity normal. Atherosclerotic calcifications aorta. Lungs clear. No definite infiltrate, pleural effusion or pneumothorax. Bones demineralized. IMPRESSION: Post CABG. No acute abnormalities. Electronically Signed  By: Ulyses SouthwardMark  Boles M.D.   On: 03/10/2019 22:57    Pending Labs Unresulted Labs (From admission, onward)    Start     Ordered   03/11/19 0023  Urine culture  Add-on,   AD    Question:  Patient immune status  Answer:  Normal   03/11/19 0023   03/10/19 2221  Troponin I (High Sensitivity)  STAT Now then every 2 hours,   STAT    Question:  Indication  Answer:  Suspect ACS   03/10/19 2220   Signed and Held  HIV antibody (Routine Testing)  Once,   R     Signed and Held   Signed and Held  CBC  (enoxaparin (LOVENOX)    CrCl >/= 30 ml/min)  Once,   R    Comments: Baseline for enoxaparin therapy IF NOT ALREADY DRAWN.  Notify MD if PLT < 100 K.    Signed and Held   Signed and Held  Creatinine, serum  (enoxaparin (LOVENOX)    CrCl >/= 30 ml/min)  Once,   R    Comments: Baseline for enoxaparin therapy IF NOT ALREADY DRAWN.    Signed and Held   Signed and Held  Creatinine, serum  (enoxaparin (LOVENOX)    CrCl >/= 30 ml/min)  Weekly,   R    Comments: while on enoxaparin therapy    Signed and Held   Signed and Held  Basic metabolic panel  Tomorrow morning,   R     Signed and Held   Signed and Held  CBC  Tomorrow morning,   R     Signed and Held          Vitals/Pain Today's Vitals   03/10/19 1421 03/10/19 2130 03/10/19 2242 03/11/19 0100  BP: (!) 223/96 (!) 196/75 (!) 230/85 (!) 185/95  Pulse: 67 (!) 49 (!) 52 61  Resp: 18 17 15 13   Temp: 98.2 F (36.8 C)     TempSrc: Oral     SpO2: 98% 98% 100% 100%  Weight:      Height:      PainSc:        Isolation Precautions No active isolations  Medications Medications  sodium  chloride flush (NS) 0.9 % injection 3 mL (3 mLs Intravenous Not Given 03/10/19 2356)  cefTRIAXone (ROCEPHIN) 1 g in sodium chloride 0.9 % 100 mL IVPB (has no administration in time range)  hydrALAZINE (APRESOLINE) injection 10 mg (has no administration in time range)  sodium chloride 0.9 % bolus 1,000 mL (1,000 mLs Intravenous Bolus 03/10/19 2238)  amLODipine (NORVASC) tablet 5 mg (5 mg Oral Given 03/10/19 2249)  enalapril (VASOTEC) tablet 10 mg (10 mg Oral Given 03/10/19 2249)    Mobility non-ambulatory Low fall risk   Focused Assessments musculoskeletal   R Recommendations: See Admitting Provider Note  Report given to:   Additional Notes:

## 2019-03-11 NOTE — ED Notes (Signed)
Pt back from MRI at this time

## 2019-03-11 NOTE — ED Notes (Signed)
MD Willis at bedside. 

## 2019-03-11 NOTE — ED Notes (Signed)
Pt to MRI at this time.

## 2019-03-11 NOTE — Progress Notes (Signed)
*  PRELIMINARY RESULTS* Echocardiogram 2D Echocardiogram has been performed.  Sherrie Sport 03/11/2019, 1:43 PM

## 2019-03-11 NOTE — [place] FL2 (Signed)
[place] MEDICAID FL2 LEVEL OF CARE SCREENING TOOL     IDENTIFICATION  Patient Name: Erica Stevens Birthdate: [birth year] Sex: female Admission Date (Current Location): 03/10/2019  [place]:  [place] [id number] K Facility and Address:  [facility], [address], [place], [place] [address]      Provider Number: [id number]  Attending Physician Name and Address:  Enid BaasKalisetti, Radhika, MD  Relative Name and Phone Number:  [name]-daughter [phone]    Current Level of Care: Hospital Recommended Level of Care: Skilled Nursing Facility Prior Approval Number:    Date Approved/Denied:   PASRR Number: [id number]  Discharge Plan: SNF    Current Diagnoses: Patient Active Problem List   Diagnosis Date Noted  . Weakness 03/11/2019  . Stroke (HCC) 03/11/2019  . History of drug dependence/abuse (HCC) 08/01/2018  . Chronic systolic congestive heart failure (HCC) 08/01/2018  . Atherosclerosis of aorta (HCC) 05/22/2017  . Fracture of thoracic spine (HCC) 05/22/2017  . Slurred speech 03/01/2017  . Opiate overdose (HCC) 01/13/2017  . Cocaine abuse (HCC) 01/13/2017  . Benzodiazepine abuse (HCC) 01/13/2017  . Heroin abuse (HCC) 05/03/2016  . Anoxia 05/03/2016  . Moderate dementia with behavioral disturbance (HCC) 05/03/2016  . Accidental heroin overdose (HCC) 05/03/2016  . Severe recurrent major depression (HCC) 05/01/2016  . Angina pectoris (HCC) 02/24/2016  . Cervical disc disease 10/02/2015  . Affective disorder, major 07/02/2015  . Benign hypertensive heart disease 03/09/2015  . Migraine 03/09/2015  . Billowing mitral valve 03/09/2015  . Osteoporosis 03/09/2015  . Urge incontinence 03/09/2015  . Chest pressure 06/21/2013  . Leg edema 06/21/2013  . Shortness of breath 06/21/2013  . CAD (coronary artery disease) of artery bypass graft 06/21/2013  . Hyperlipidemia 06/21/2013  . Chronic pain 03/25/2009  . Hypercholesterolemia  without hypertriglyceridemia 05/22/2007  . Coronary atherosclerosis 05/22/2007  . 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 Brachial neuritis 12/29/2006    Orientation RESPIRATION BLADDER Height & Weight     Self  Normal Incontinent Weight: 59 kg Height:  5' (152.4 cm)  BEHAVIORAL SYMPTOMS/MOOD NEUROLOGICAL BOWEL NUTRITION STATUS      Incontinent Diet(DYS 3)  AMBULATORY STATUS COMMUNICATION OF NEEDS Skin   Limited Assist Verbally Normal                       Personal Care Assistance Level of Assistance  Bathing, Feeding, Dressing Bathing Assistance: Limited assistance Feeding assistance: Independent Dressing Assistance: Limited assistance     Functional Limitations Info  Sight, Hearing, Speech Sight Info: Adequate Hearing Info: Adequate Speech Info: Adequate    SPECIAL CARE FACTORS FREQUENCY  PT (By licensed PT)     PT Frequency: 5 x week              Contractures Contractures Info: Not present    Additional Factors Info  Code Status, Allergies Code Status Info: full Allergies Info: Erythromycin, Ivp Dye Iodinated Diagnostic Agents, Sulfa Antibiotics, Sulfur           Current Medications (03/11/2019):  This is the current hospital active medication list Current Facility-Administered Medications  Medication Dose Route Frequency Provider Last Rate Last Dose  .  stroke: mapping our early stages of recovery book   Does not apply Once Oralia ManisWillis, David, MD      . 0.9 %  sodium chloride infusion   Intravenous PRN Oralia ManisWillis, David, MD   Stopped at 03/11/19 0320  . acetaminophen (TYLENOL) tablet 650 mg  650 mg Oral Q6H PRN Oralia ManisWillis, David,  MD   650 mg at 03/11/19 1215   Or  . acetaminophen (TYLENOL) suppository 650 mg  650 mg Rectal Q6H PRN Lance Coon, MD      . aspirin chewable tablet 81 mg  81 mg Oral Daily Gladstone Lighter, MD   81 mg at 03/11/19 0904  . atorvastatin (LIPITOR) tablet 40 mg  40 mg Oral q1800 Gladstone Lighter, MD      . clopidogrel (PLAVIX) tablet 75  mg  75 mg Oral Daily Gladstone Lighter, MD   75 mg at 03/11/19 0904  . donepezil (ARICEPT) tablet 5 mg  5 mg Oral QHS Gladstone Lighter, MD      . enoxaparin (LOVENOX) injection 40 mg  40 mg Subcutaneous Q24H Lance Coon, MD   40 mg at 03/11/19 0903  . feeding supplement (ENSURE ENLIVE) (ENSURE ENLIVE) liquid 237 mL  237 mL Oral BID BM Gladstone Lighter, MD   237 mL at 03/11/19 1515  . hydrALAZINE (APRESOLINE) injection 10 mg  10 mg Intravenous Q4H PRN Lance Coon, MD   10 mg at 03/11/19 0159  . [START ON 03/12/2019] multivitamin with minerals tablet 1 tablet  1 tablet Oral Daily Gladstone Lighter, MD      . ondansetron [facility]) tablet 4 mg  4 mg Oral Q6H PRN Lance Coon, MD       Or  . ondansetron [facility]) injection 4 mg  4 mg Intravenous Q6H PRN Lance Coon, MD      . sodium chloride flush (NS) 0.9 % injection 3 mL  3 mL Intravenous Once Duffy Bruce, MD      . vitamin C (ASCORBIC ACID) tablet 250 mg  250 mg Oral BID Gladstone Lighter, MD         Discharge Medications: Please see discharge summary for a list of discharge medications.  Relevant Imaging Results:  Relevant Lab Results:   Additional Information [id number]  Elza Rafter, RN

## 2019-03-11 NOTE — Progress Notes (Signed)
SLP Cancellation Note  Patient Details Name: Erica Stevens MRN: [id number] DOB: 07-Oct-1950   Cancelled treatment:       Reason Eval/Treat Not Completed: Other (comment)(Attempted at 9am, pt w/PT; re-attempted 9:30 am, began briefly when Neurologist arrived to see pt)  Chart reviewed. Nsg consulted who reports toleration of current regular diet with thin liquids with no apparent s/s aspiration. SLP to re-attempt Cognitive-communication evaluation today if time permits, or will re-attempt tomorrow.   Jamella Grayer, MA, CCC-SLP 03/11/2019, 10:28 AM

## 2019-03-11 NOTE — Progress Notes (Signed)
Discussed plan of care with daughter over the phone today.  Daughter mentioned that they were in the process of moving Ms. Erica Stevens to an assisted living facility before the [place] started.  Ms. Erica Stevens condition has been declining lately.  She has a walker at home.  Has vascular dementia and lives at home with her mother.  Patient has been having loss of bowel and bladder control recently and short-term memory loss. All questions regarding her plan of care has been answered to the family's satisfaction

## 2019-03-11 NOTE — Progress Notes (Signed)
OT Cancellation Note  Patient Details Name: Erica Stevens MRN: [id number] DOB: 02/04/1951   Cancelled Treatment:    Reason Eval/Treat Not Completed: Patient at procedure or test/ unavailable. Thank you for the OT consult. Order received and chart reviewed. Upon arrival to pt room, pt with hospital staff for ECHO. Will re-attempt at a later time/date as available and pt medically appropriate for OT tx.  Shara Blazing, M.S., OTR/L Ascom: [phone] 03/11/19, 1:16 PM

## 2019-03-12 LAB — HEMOGLOBIN A1C
Hgb A1c MFr Bld: 5 % (ref 4.8–5.6)
Mean Plasma Glucose: 97 mg/dL

## 2019-03-12 LAB — BASIC METABOLIC PANEL
Anion gap: 7 (ref 5–15)
BUN: 15 mg/dL (ref 8–23)
CO2: 25 mmol/L (ref 22–32)
Calcium: 8.8 mg/dL — ABNORMAL LOW (ref 8.9–10.3)
Chloride: 106 mmol/L (ref 98–111)
Creatinine, Ser: 0.82 mg/dL (ref 0.44–1.00)
GFR calc Af Amer: >60 mL/min (ref >60)
GFR calc non Af Amer: >60 mL/min (ref >60)
Glucose, Bld: 95 mg/dL (ref 70–99)
Potassium: 3.8 mmol/L (ref 3.5–5.1)
Sodium: 138 mmol/L (ref 135–145)

## 2019-03-12 LAB — HIV ANTIBODY (ROUTINE TESTING W REFLEX): HIV Screen 4th Generation wRfx: NONREACTIVE

## 2019-03-12 MED ORDER — ATORVASTATIN CALCIUM 80 MG PO TABS
80.0000 mg | ORAL_TABLET | Freq: Every day | ORAL | Status: DC
  Administered 2019-03-12 – 2019-03-13 (×2): 80 mg via ORAL
  Filled 2019-03-12 (×3): qty 1

## 2019-03-12 MED ORDER — AMLODIPINE BESYLATE 5 MG PO TABS
5.0000 mg | ORAL_TABLET | Freq: Every day | ORAL | Status: DC
  Administered 2019-03-12 – 2019-03-13 (×2): 5 mg via ORAL
  Filled 2019-03-12 (×2): qty 1

## 2019-03-12 MED ORDER — METOPROLOL SUCCINATE ER 25 MG PO TB24
25.0000 mg | ORAL_TABLET | Freq: Every day | ORAL | Status: DC
  Administered 2019-03-12 – 2019-03-14 (×2): 25 mg via ORAL
  Filled 2019-03-12 (×3): qty 1

## 2019-03-12 MED ORDER — NICOTINE 14 MG/24HR TD PT24
14.0000 mg | MEDICATED_PATCH | Freq: Every day | TRANSDERMAL | Status: DC
  Administered 2019-03-12 – 2019-03-14 (×3): 14 mg via TRANSDERMAL
  Filled 2019-03-12 (×3): qty 1

## 2019-03-12 MED ORDER — ENALAPRIL MALEATE 10 MG PO TABS
10.0000 mg | ORAL_TABLET | Freq: Two times a day (BID) | ORAL | Status: DC
  Administered 2019-03-12 – 2019-03-14 (×5): 10 mg via ORAL
  Filled 2019-03-12 (×6): qty 1

## 2019-03-12 NOTE — Plan of Care (Signed)
  Problem: Education: Goal: Knowledge of disease or condition will improve Outcome: Progressing   

## 2019-03-12 NOTE — Plan of Care (Signed)
  Problem: Safety: Goal: Ability to remain free from injury will improve Outcome: Progressing   Problem: Skin Integrity: Goal: Risk for impaired skin integrity will decrease Outcome: Progressing   Problem: Education: Goal: Knowledge of disease or condition will improve Outcome: Progressing   Problem: Coping: Goal: Will verbalize positive feelings about self Outcome: Progressing

## 2019-03-12 NOTE — Consult Note (Signed)
[facility] Cardiology  CARDIOLOGY CONSULT NOTE  Patient ID: Erica Stevens MRN: [id number] DOB/AGE: Jan 20, 1951 68 y.o.  Admit date: 03/10/2019 Referring Physician Tressia Miners Primary Physician Steele Sizer, MD  Primary Cardiologist Fath Reason for Consultation TEE  HPI: 68 year old female referred for evaluation of transesophageal echocardiogram with recent stroke.  The patient has a history of coronary artery disease status post CABG x3 in 2006, hypertension, hyperlipidemia, chronic diastolic CHF, and mitral regurgitation.  Cardiac catheterization in 2015 revealed diffusely diseased RCA with collaterals from left to right, with an occluded vein graft to the RCA, and an occluded vein graft to the diagonal, with a patent LIMA to LAD.  The patient presented to [facility] ER yesterday for left-sided weakness.  Brain MRI revealed acute right basal ganglion infarct and a subacute left corona radiata infarct with concern for an embolic etiology.  Carotid ultrasound negative for hemodynamically significant stenosis.  2D echocardiogram with bubble study revealed normal left ventricular function, with LVEF 60 to 65% without evidence of interatrial shunting and no evidence of intracardiac thrombi or masses.  A TEE was recommended for further evaluation. Currently, the patient denies chest pain, shortness of breath, or palpitations. She has chronic back pain, exacerbated by lying in bed.  Review of systems complete and found to be negative unless listed above     Past Medical History:  Diagnosis Date  . Allergic rhinitis due to pollen   . Anxiety   . Brachial neuritis or radiculitis NOS   . Cellulitis and abscess of unspecified site   . Chronic kidney disease, stage I   . Chronic pain syndrome   . Congestive heart failure, unspecified   . Coronary atherosclerosis of unspecified type of vessel, native or graft   . Degeneration of cervical intervertebral disc   . Depression   . Hypertension   . Migraine,  unspecified, without mention of intractable migraine without mention of status migrainosus   . 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 Osteoporosis, unspecified   . Pure hypercholesterolemia   . Reflux esophagitis   . Thoracic or lumbosacral neuritis or radiculitis, unspecified   . Unspecified urinary incontinence     Past Surgical History:  Procedure Laterality Date  . ABDOMINAL HYSTERECTOMY    . BLADDER SURGERY    . BYPASS GRAFT    . CARDIAC CATHETERIZATION  2014   [facility]  . CHOLECYSTECTOMY    . COLONOSCOPY    . CORONARY ARTERY BYPASS GRAFT     x3 grafts;[facility]  . fasciotomy      Medications Prior to Admission  Medication Sig Dispense Refill Last Dose  . amLODipine (NORVASC) 5 MG tablet Take 1 tablet (5 mg total) by mouth daily. 90 tablet 1   . aspirin 81 MG tablet Take 1 tablet (81 mg total) by mouth daily. 90 tablet 1   . atorvastatin (LIPITOR) 80 MG tablet Take 1 tablet (80 mg total) by mouth daily. 90 tablet 1   . donepezil (ARICEPT) 5 MG tablet Take 5 mg by mouth daily.     . enalapril (VASOTEC) 10 MG tablet Take 1 tablet (10 mg total) by mouth 2 (two) times daily. 180 tablet 1   . metoprolol succinate (TOPROL-XL) 25 MG 24 hr tablet TAKE ONE (1) TABLET EACH DAY 90 tablet 1   . vitamin B-12 (CYANOCOBALAMIN) 500 MCG tablet Take 500 mcg by mouth daily.     Marland Kitchen VITAMIN D, CHOLECALCIFEROL, PO Take by mouth.      Social History   Socioeconomic History  .  Marital status: Divorced    Spouse name: Not on file  . Number of children: Not on file  . Years of education: Not on file  . Highest education level: Not on file  Occupational History  . Not on file  Social Needs  . Financial resource strain: Not on file  . Food insecurity    Worry: Not on file    Inability: Not on file  . Transportation needs    Medical: Not on file    Non-medical: Not on file  Tobacco Use  . Smoking status: Current Every Day Smoker    Packs/day: 1.50    Types: Cigarettes  . Smokeless tobacco: Never Used   Substance and Sexual Activity  . Alcohol use: No  . Drug use: Yes    Types: Cocaine, Heroin, Hydrocodone, Benzodiazepines    Comment: quit 04-2017  . Sexual activity: Not Currently  Lifestyle  . Physical activity    Days per week: Not on file    Minutes per session: Not on file  . Stress: Not on file  Relationships  . Social Musician on phone: Not on file    Gets together: Not on file    Attends religious service: Not on file    Active member of club or organization: Not on file    Attends meetings of clubs or organizations: Not on file    Relationship status: Not on file  . Intimate partner violence    Fear of current or ex partner: Not on file    Emotionally abused: Not on file    Physically abused: Not on file    Forced sexual activity: Not on file  Other Topics Concern  . Not on file  Social History Narrative   She moved in with her daughter and grandchildren August 2018.  Now with her mother. 10-02-17.   Used to live by herself, divorced, has 2 children.  Education: HS.     Long history of depression, since delivery of her children        Family History  Problem Relation Age of Onset  . Hypertension Mother   . Hyperlipidemia Mother   . Heart disease Mother   . COPD Sister   . Depression Daughter   . Drug abuse Son       Review of systems complete and found to be negative unless listed above      PHYSICAL EXAM  General: Frail elderly female lying supine in bed, appears older than stated age, in no acute distress HEENT:  Normocephalic and atramatic. Left sided facial droop, slurred speech Neck:  No JVD.  Lungs: Clear bilaterally to auscultation, normal effort of breathing on room air Heart: HRRR . 2/6 systolic murmur Abdomen: nondistended Msk:  Gait not assessed. Left extremity paralysis Extremities: No clubbing, cyanosis or edema.   Neuro: Alert and oriented X 3. Psych:  Good affect, responds appropriately  Labs:   Lab Results  Component  Value Date   WBC 7.6 03/11/2019   HGB 13.3 03/11/2019   HCT 40.1 03/11/2019   MCV 93.9 03/11/2019   PLT 261 03/11/2019    Recent Labs  Lab 03/12/19 0458  NA 138  K 3.8  CL 106  CO2 25  BUN 15  CREATININE 0.82  CALCIUM 8.8*  GLUCOSE 95   Lab Results  Component Value Date   TROPONINI <0.03 12/05/2018    Lab Results  Component Value Date   CHOL 214 (H) 03/11/2019   CHOL  158 11/26/2018   CHOL 228 (H) 03/01/2017   Lab Results  Component Value Date   HDL 50 03/11/2019   HDL 50 11/26/2018   HDL 48 03/01/2017   Lab Results  Component Value Date   LDLCALC 151 (H) 03/11/2019   LDLCALC 88 11/26/2018   LDLCALC 156 (H) 03/01/2017   Lab Results  Component Value Date   TRIG 67 03/11/2019   TRIG 106 11/26/2018   TRIG 122 03/01/2017   Lab Results  Component Value Date   CHOLHDL 4.3 03/11/2019   CHOLHDL 3.2 11/26/2018   CHOLHDL 4.8 03/01/2017   No results found for: LDLDIRECT    Radiology: Ct Head Wo Contrast  Result Date: 03/10/2019 CLINICAL DATA:  Fall, altered level of consciousness. EXAM: CT HEAD WITHOUT CONTRAST TECHNIQUE: Contiguous axial images were obtained from the base of the skull through the vertex without intravenous contrast. COMPARISON:  12/05/2018 FINDINGS: Brain: There is atrophy and chronic small vessel disease changes. Bilateral lacunar infarcts. No acute intracranial abnormality. Specifically, no hemorrhage, hydrocephalus, mass lesion, acute infarction, or significant intracranial injury. Vascular: No hyperdense vessel or unexpected calcification. Skull: No acute calvarial abnormality. Sinuses/Orbits: Visualized paranasal sinuses and mastoids clear. Orbital soft tissues unremarkable. Other: None IMPRESSION: Old bilateral basal ganglia lacunar infarcts. Atrophy, chronic microvascular disease. No acute intracranial abnormality. Electronically Signed   By: Charlett NoseKevin  Dover M.D.   On: 03/10/2019 23:11   Ct Lumbar Spine Wo Contrast  Result Date:  03/10/2019 CLINICAL DATA:  Weakness, back pain, fell earlier today, dementia EXAM: CT LUMBAR SPINE WITHOUT CONTRAST TECHNIQUE: Multidetector CT imaging of the lumbar spine was performed without intravenous contrast administration. Multiplanar CT image reconstructions were also generated. COMPARISON:  Lumbar spine radiographs 05/22/2012 FINDINGS: Segmentation: Prior lumbar radiographs demonstrate 5 non-rib-bearing lumbar type vertebra. Alignment: Normal Vertebrae: Diffuse osseous demineralization. Lumbar vertebra normal in height and alignment. No lumbar fracture, subluxation, or bone destruction. However, significant compression deformity of the T12 vertebral body is identified with approximately 60-70% anterior height loss, progressive since 02/25/2017. No additional lumbar fractures. Visualized pelvis intact. Paraspinal and other soft tissues: Probable vascular calcifications at LEFT kidney. RIGHT renal mass 3.4 x 3.2 cm, low attenuation consistent with cyst. Atherosclerotic calcifications aorta and iliac arteries. Disc levels: No significant disc abnormalities at any level. IMPRESSION: Progressive compression fracture of the T12 vertebral body now demonstrating 60-70% height loss, increased since 02/25/2017. Osseous demineralization without additional acute bony abnormalities. RIGHT renal cyst 3.4 x 3.2 cm; character of this lesion is poorly assessed by this CT exam, recommend follow-up non emergent renal sonographic characterization. Electronically Signed   By: Ulyses SouthwardMark  Boles M.D.   On: 03/10/2019 23:19   Mr Brain Wo Contrast  Result Date: 03/11/2019 CLINICAL DATA:  68 y/o F; fall at home and weakness. History of dementia. EXAM: MRI HEAD WITHOUT CONTRAST TECHNIQUE: Multiplanar, multiecho pulse sequences of the brain and surrounding structures were obtained without intravenous contrast. COMPARISON:  03/10/2019 CT head.  11/09/2018 MRI head. FINDINGS: Brain: Linear focus of reduced diffusion spanning  approximately 2 cm extending from the right lentiform nucleus to the posterior frontal periventricular white matter (series 9 images 26-32 and series 11 images 19-22) compatible with acute/early subacute infarction. There is an additional subcentimeter focus of intermediate diffusion within the left corona radiata (series 9, image 32) consistent with subacute infarct. There is a small chronic infarction within the right anterior basal ganglia. Early to large confluent nonspecific T2 FLAIR hyperintensities in subcortical and periventricular white matter are compatible with moderate to severe  chronic microvascular ischemic changes. Stable volume loss of the brain and ventricular enlargement. No new susceptibility hypo hypointensity to indicate inter intracranial hemorrhage. No mass effect, extra-axial collection, or herniation. Vascular: Normal flow voids. Skull and upper cervical spine: Normal marrow signal. Sinuses/Orbits: Negative. Other: None. IMPRESSION: 1. Small acute/early subacute lacunar infarction of right basal ganglia extending from right lentiform nucleus to posterior frontal periventricular white matter. No hemorrhage or mass effect. 2. New subcentimeter subacute infarction within left corona radiata. 3. Stable background of advanced chronic microvascular ischemic changes and volume loss of the brain. Stable chronic infarction within the right anterior basal ganglia. These results were called by telephone at the time of interpretation on 03/11/2019 at 2:05 am to Dr. Dolores FrameSung, who verbally acknowledged these results. Electronically Signed   By: Mitzi HansenLance  Furusawa-Stratton M.D.   On: 03/11/2019 02:07   [place] Carotid Bilateral (at [facility] And [facility] Only)  Result Date: 03/11/2019 CLINICAL DATA:  Stroke. History of hypertension, smoking and CAD (coronary bypass grafting) EXAM: BILATERAL CAROTID DUPLEX ULTRASOUND TECHNIQUE: Wallace CullensGray scale imaging, color Doppler and duplex ultrasound were performed of bilateral carotid and  vertebral arteries in the neck. COMPARISON:  None. FINDINGS: Criteria: Quantification of carotid stenosis is based on velocity parameters that correlate the residual internal carotid diameter with NASCET-based stenosis levels, using the diameter of the distal internal carotid lumen as the denominator for stenosis measurement. The following velocity measurements were obtained: RIGHT ICA: 76/20 cm/sec CCA: 107/17 cm/sec SYSTOLIC ICA/CCA RATIO:  0.7 ECA: 182 cm/sec LEFT ICA: 117/34 cm/sec CCA: 87/19 cm/sec SYSTOLIC ICA/CCA RATIO:  1.3 ECA: 210 cm/sec RIGHT CAROTID ARTERY: There is a minimal to moderate amount of eccentric echogenic plaque within the right carotid bulb (image 16), extending to involve the origin and proximal aspects of the right internal carotid artery (image 23, not resulting in elevated peak systolic velocities within the interrogated course the right internal carotid artery to suggest a hemodynamically significant stenosis. RIGHT VERTEBRAL ARTERY:  Antegrade flow LEFT CAROTID ARTERY: There is a minimal to moderate amount of eccentric echogenic plaque within the left carotid bulb (image 49), extending to involve the origin and proximal aspects of the left internal carotid artery (image 56), not resulting in elevated peak systolic velocities within the interrogated course the left internal carotid artery to suggest a hemodynamically significant stenosis. LEFT VERTEBRAL ARTERY:  Antegrade flow IMPRESSION: Minimal to moderate amount of bilateral atherosclerotic plaque, not resulting in a hemodynamically significant stenosis within either internal carotid artery. Electronically Signed   By: Simonne ComeJohn  Watts M.D.   On: 03/11/2019 08:32   Dg Chest Portable 1 View  Result Date: 03/10/2019 CLINICAL DATA:  Weakness, fell earlier today, dementia, history CHF, chronic kidney disease, coronary artery disease, smoker EXAM: PORTABLE CHEST 1 VIEW COMPARISON:  Portable exam 2228 hours compared to 02/28/2017 FINDINGS:  Minimal enlargement of cardiac silhouette post CABG. Mediastinal contours and pulmonary vascularity normal. Atherosclerotic calcifications aorta. Lungs clear. No definite infiltrate, pleural effusion or pneumothorax. Bones demineralized. IMPRESSION: Post CABG. No acute abnormalities. Electronically Signed   By: Ulyses SouthwardMark  Boles M.D.   On: 03/10/2019 22:57    EKG: sinus rhythm  ASSESSMENT AND PLAN:  1.  Acute stroke, concerning for embolic etiology, carotid Doppler negative for evidence of hemodynamically significant stenosis, and 2D echocardiogram negative for interatrial shunting, cardiac thrombi, or masses. TEE recommended. 2. Coronary artery disease, status post CABG x 3, currently without chest pain, on atorvastatin, aspirin, Plavix, and metoprolol.  Plan: 1. Continue telemetry monitoring  2. Patient may follow-up  with Dr. Lady GaryFath as outpatient for prolonged cardiac monitoring for evaluation of atrial fibrillation. 3. Continue aspirin, Plavix, atorvastatin, and metoprolol. 4. The procedure details, risks and benefits of TEE were discussed with the patient, and she is quite hesitant to proceed at this time. Please notify Dr. Darrold JunkerParaschos if patient chooses to undergo TEE.  Signed: Leanora Ivanoffnna Creek Gan PA-C  03/12/2019, 8:57 AM

## 2019-03-12 NOTE — Progress Notes (Signed)
Occupational Therapy Treatment Patient Details Name: Erica Stevens MRN: [id number] DOB: [birth year] Today's Date: 03/12/2019    History of present illness Pt presented to ED for weakness, fall. PMH of CKD, CABG, chronic pain syndrome, CHF, dementia, depression, HTN. MRI showed;Small acute/early subacute lacunar infarction of right basal ganglia extending from right lentiform nucleus to posterior frontal periventricular white matter. New subcentimeter subacute infarction within left corona radiata. Stable background of advanced chronic microvascular ischemic changes and volume loss of the brain. Stable chronic infarction within the right anterior basal ganglia. No hemorrhage or mass effect. Also, T12 compression fx now showing 60-70% loss of height.   OT comments  Pt seen for OT tx this date. Pt alert and with additional time to initiate, does turn head to L side to address therapist without prompting. Pt denies complaints. With set up and verbal cues, pt uses R dom UE to wash L hand and L forearm with washcloth. Pt requires increasing frequency and variety of cuing to terminate washing task. Pt instructed in AAROM with support at the wrist to prevent injury. Pt able to return demonstrate after initial instruction. Would benefit from further instruction to support recall and carryover. Pt demonstrating improved attention to L side, however does require occasional cues. At end of session pt turns head to R side and does not respond to therapist. Therapist noted significantly long toenails and notified RN. Toenails may present a skin breakdown/wound risk given that they are curling and beginning to press into the underside of pt's toes. Pt may benefit from a podiatry consult to address.    Follow Up Recommendations  SNF    Equipment Recommendations  Other (comment)(TBD)    Recommendations for Other Services      Precautions / Restrictions Precautions Precautions: Fall Precaution Comments: L  neglect Restrictions Weight Bearing Restrictions: No       Mobility Bed Mobility  Transfers                    Balance                               ADL either performed or assessed with clinical judgement   ADL Overall ADL's : Needs assistance/impaired     Grooming: Wash/dry hands;Bed level;Set up Grooming Details (indicate cue type and reason): verbal and visual cues to terminate/complete                                     Vision   Vision Assessment?: Vision impaired- to be further tested in functional context;Yes   Perception     Praxis Praxis Praxis-Other Comments: requires cues this date to complete/terminate grooming task    Cognition Arousal/Alertness: Awake/alert Behavior During Therapy: WFL for tasks assessed/performed Overall Cognitive Status: No family/caregiver present to determine baseline cognitive functioning                                 General Comments: Pt alert, orientedx3, slow to respond and requires cues to terminate task        Exercises Other Exercises Other Exercises: with set up and cues to initiate, pt able to use R hand to wash L hand and forearm, required tactile and visual cues at midline to terminate task after initial verbal cues were unsuccessful  Other Exercises: pt instructed in AAROM using her RUE to support LUE at the wrist to perform bilat ROM ex   Shoulder Instructions       General Comments Pt's toenails very long and thick, curling extensively, to the point that they are beginning to press into the plantar surface of her toes. RN notified. Podiatry consult may be appropriate to assess?    Pertinent Vitals/ Pain       Pain Assessment: No/denies pain  Home Living                                          Prior Functioning/Environment              Frequency  Min 2X/week        Progress Toward Goals  OT Goals(current goals can now be found  in the care plan section)  Progress towards OT goals: Progressing toward goals  Acute Rehab OT Goals Patient Stated Goal: Pt did not state any goals  Plan Discharge plan remains appropriate;Frequency remains appropriate    Co-evaluation                 AM-PAC OT "6 Clicks" Daily Activity     Outcome Measure   Help from another person eating meals?: A Little Help from another person taking care of personal grooming?: A Little Help from another person toileting, which includes using toliet, bedpan, or urinal?: A Lot Help from another person bathing (including washing, rinsing, drying)?: A Lot Help from another person to put on and taking off regular upper body clothing?: A Lot Help from another person to put on and taking off regular lower body clothing?: A Lot 6 Click Score: 14    End of Session    OT Visit Diagnosis: Hemiplegia and hemiparesis;Other abnormalities of gait and mobility (R26.89) Hemiplegia - Right/Left: Left Hemiplegia - dominant/non-dominant: Non-Dominant Hemiplegia - caused by: Cerebral infarction   Activity Tolerance Patient tolerated treatment well   Patient Left in bed;with call bell/phone within reach;with bed alarm set   Nurse Communication Other (comment)(pt's toenails may present risk of infection/skin breakdown at some point)        Time: [id number] OT Time Calculation (min): 16 min  Charges: OT General Charges $OT Visit: 1 Visit OT Treatments $Neuromuscular Re-education: 8-22 mins  Richrd PrimeJamie Stiller, MPH, MS, OTR/L ascom [phone] 03/12/19, 4:32 PM

## 2019-03-12 NOTE — Evaluation (Signed)
Clinical/Bedside Swallow Evaluation Patient Details  Name: Erica Stevens MRN: [id number] Date of Birth: [birth year]  Today's Date: 03/12/2019 Time: SLP Start Time (ACUTE ONLY): 1055 SLP Stop Time (ACUTE ONLY): 1139 SLP Time Calculation (min) (ACUTE ONLY): 44 min  Past Medical History:  Past Medical History:  Diagnosis Date  . Allergic rhinitis due to pollen   . Anxiety   . Brachial neuritis or radiculitis NOS   . Cellulitis and abscess of unspecified site   . Chronic kidney disease, stage I   . Chronic pain syndrome   . Congestive heart failure, unspecified   . Coronary atherosclerosis of unspecified type of vessel, native or graft   . Degeneration of cervical intervertebral disc   . Depression   . Hypertension   . Migraine, unspecified, without mention of intractable migraine without mention of status migrainosus   . 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 Osteoporosis, unspecified   . Pure hypercholesterolemia   . Reflux esophagitis   . Thoracic or lumbosacral neuritis or radiculitis, unspecified   . Unspecified urinary incontinence    Past Surgical History:  Past Surgical History:  Procedure Laterality Date  . ABDOMINAL HYSTERECTOMY    . BLADDER SURGERY    . BYPASS GRAFT    . CARDIAC CATHETERIZATION  2014   [facility]  . CHOLECYSTECTOMY    . COLONOSCOPY    . CORONARY ARTERY BYPASS GRAFT     x3 grafts;[facility]  . fasciotomy     HPI:  Pt presented to ED for weakness, fall. PMH of CKD, CABG, chronic pain syndrome, CHF, Dementia, depression, HTN. Further history was limited d/t Dementia per chart notes. MRI showed; small acute/early subacute lacunar infarction of right basal ganglia extending from right lentiform nucleus to posterior frontal periventricular white matter. New subcentimeter subacute infarction within left corona radiata. Stable background of advanced chronic microvascular ischemic changes and volume loss of the brain. Stable chronic infarction within the right anterior  basal ganglia. No hemorrhage or mass effect. Per Daughter, she does not believe that she hit her head and pt is at her apparent baseline mental status, though she does appear more tired than usual.   Assessment / Plan / Recommendation Clinical Impression  Pt appears to present w/ grossly adequate oropharyngeal phase swallowing w/ reduced risk for aspiration when following general aspiration precautions. Pt only has Upper Denture Plate - no bottom dentition. Suspect this could impact her mastication of solid foods though pt did not elaborate or give details as to her ability to chew/masticate foods; foods of choice when asked. Pt required setup support but then fed self trials of thin liquids via cup/straw and purees (declined soft solids when offered) w/ no overt s/s of aspiration noted; no decline in vocal quality or respiratory status during/post trials. Oral phase appeared [facility] for bolus management and timely A-P transfer of boluses consumed - again, no solids were accepted. Oral clearing appropriate b/t trials. Pt fed self w/ setup assistance. Recommend a more Mech Soft diet (meats cut, moist foods) for easier self-feeding and mastication d/t missing Lower Dentition; Thin liquids. Recommend general aspiration precautions and Pills given in puree if easier for swallowing, clearing d/t baseline Dementia. Recommend tray setup at meals for assistance and reducing distractions during meals. Pt appears to need support w/ chooding/identifying foods of choice for meals - she could not elaborate on any food desires given choices(suspect this is impacted by the Dementia). No further skilled ST services indicated at this time as pt appears at her baseline  re: swallowing. Recommend use of adhesive for Upper Dentures if become loose. NSG updated and agreed. Recommend f/u by Dietician for nutritional support as needed. SLP Visit Diagnosis: Dysphagia, oral phase (R13.11)(pt is missing bottom Dentition; Dementia baseline)     Aspiration Risk  Mild aspiration risk(but reduced following precautions)    Diet Recommendation  Dysphagia level 3 (mech soft foods w/ cut meats, moistened foods); Thin liquids. General aspiration precautions. Tray setup and support/encouragement at meals d/t baseline Dementia.   Medication Administration: Whole meds with puree(if needed for easier, safer swallowing)    Other  Recommendations Recommended Consults: (dietician f/u for support) Oral Care Recommendations: Oral care BID;Staff/trained caregiver to provide oral care(denture plate also) Other Recommendations: (n/a)   Follow up Recommendations None      Frequency and Duration (n/a)  (n/a)       Prognosis Prognosis for Safe Diet Advancement: Fair(-Good) Barriers to Reach Goals: Cognitive deficits;Time post onset;Severity of deficits Barriers/Prognosis Comment: baseline; upper Denture plate only      Swallow Study   General Date of Onset: 03/11/19 HPI: Pt presented to ED for weakness, fall. PMH of CKD, CABG, chronic pain syndrome, CHF, Dementia, depression, HTN. Further history was limited d/t Dementia per chart notes. MRI showed; small acute/early subacute lacunar infarction of right basal ganglia extending from right lentiform nucleus to posterior frontal periventricular white matter. New subcentimeter subacute infarction within left corona radiata. Stable background of advanced chronic microvascular ischemic changes and volume loss of the brain. Stable chronic infarction within the right anterior basal ganglia. No hemorrhage or mass effect. Per Daughter, she does not believe that she hit her head and pt is at her apparent baseline mental status, though she does appear more tired than usual. Type of Study: Bedside Swallow Evaluation Previous Swallow Assessment: none reported Diet Prior to this Study: Regular;Thin liquids Temperature Spikes Noted: No(wbc 7.6) Respiratory Status: Room air History of Recent Intubation:  No Behavior/Cognition: Alert;Cooperative;Pleasant mood;Confused;Requires cueing Oral Cavity Assessment: Within Functional Limits Oral Care Completed by SLP: Recent completion by staff Oral Cavity - Dentition: Dentures, top(no bottom Dentition) Vision: Functional for self-feeding Self-Feeding Abilities: Able to feed self;Needs assist;Needs set up Patient Positioning: Upright in bed(needed positioning) Baseline Vocal Quality: Low vocal intensity(adequate) Volitional Cough: Cognitively unable to elicit Volitional Swallow: Unable to elicit    Oral/Motor/Sensory Function Overall Oral Motor/Sensory Function: Within functional limits   Ice Chips Ice chips: Within functional limits Presentation: Spoon(fed; 2 trials)   Thin Liquid Thin Liquid: Within functional limits Presentation: Self Fed;Straw;Cup(~4+ ozs of coffee; 6 trials sips of Soda) Other Comments: distracted    Nectar Thick Nectar Thick Liquid: Not tested   Honey Thick Honey Thick Liquid: Not tested   Puree Puree: Within functional limits Presentation: Self Fed;Spoon(4 trials) Other Comments: adequate oral motor control of boluses; clearing post   Solid     Solid: Not tested Other Comments: pt declined       Jerilynn SomKatherine Watson, MS, CCC-SLP Watson,Katherine 03/12/2019,3:44 PM

## 2019-03-12 NOTE — Progress Notes (Signed)
PT Cancellation Note  Patient Details Name: Erica Stevens MRN: [id number] DOB: 01/04/1951   Cancelled Treatment:    Reason Eval/Treat Not Completed: Other (comment)(RN at bedside to administer medications PT will re-attempt as able)   Lieutenant Diego PT, DPT 10:11 AM,03/12/19 [phone]

## 2019-03-12 NOTE — Progress Notes (Signed)
SLP Cancellation Note  Patient Details Name: Erica Stevens MRN: [id number] DOB: [birth year]   Cancelled treatment:       Reason Eval/Treat Not Completed: (chart reviewed). Per chart notes, pt has a baseline of Dementia, and Dtr reported pt was at her "baseline mental status though more tired than usual" during admission to the ED. With pt having a baseline of Cognitive decline during this acute illness/admission to the hospital, will hold on any Cognitive assessment and recommend f/u at home or SNF s/p discharge IF any decline in status is noted from pt's baseline functional level. Pt is currently able to feed self and verbalize her basic wants/needs though verbal output is reduced(unsure of her baseline). MD updated and agreed. Recommend support w/ ordering her meals; reduce distractions and keep communication functional offering Y/N questions when needed. NSG updated.     Orinda Kenner, MS, CCC-SLP Watson,Katherine 03/12/2019, 3:49 PM

## 2019-03-12 NOTE — Progress Notes (Signed)
Physical Therapy Treatment Patient Details Name: Erica Stevens MRN: [id number] DOB: Nov 14, 1950 Today's Date: 03/12/2019    History of Present Illness Pt presented to ED for weakness, fall. PMH of CKD, CABG, chronic pain syndrome, CHF, dementia, depression, HTN. MRI showed;Small acute/early subacute lacunar infarction of right basal ganglia extending from right lentiform nucleus to posterior frontal periventricular white matter. New subcentimeter subacute infarction within left corona radiata. Stable background of advanced chronic microvascular ischemic changes and volume loss of the brain. Stable chronic infarction within the right anterior basal ganglia. No hemorrhage or mass effect. Also, T12 compression fx now showing 60-70% loss of height.    PT Comments    Patient alert, oriented to self, situation, place. Denied pain but requested nicotine patch, RN informed. Pt with change in tone in LLE this session compared to last. PT able to range L knee and flex hip, with bouts of increased tone, unable to DF L ankle. Pt with improved opening of L hand as well. L neglect moderately improved, pt able to attend better to PT positioned to her left with less cueing needed. Supine to sit maxA, pt able to scoot buttocks laterally towards PT and initiate supine to sit. Pt with improved seated balance, mod-occasional CGA to maintain, able to lean posteriorly/anteriorly with CGA for safety. Still with difficulty sitting upright. L lateral lean noted today. Pt was able to maintain 4-5 minutes with constant multimodal cueing to promote posture, LUE postioning, and abdominal activation with moderate pt fatigue and complaints of dizziness. PT noted pt began to have BM, return to supine total assist and RN in room to assist with pt care. The patient would benefit from further skilled PT to continue to progress towards goals.    Follow Up Recommendations  SNF     Equipment Recommendations  Other (comment)     Recommendations for Other Services OT consult     Precautions / Restrictions Precautions Precautions: Fall Restrictions Weight Bearing Restrictions: No    Mobility  Bed Mobility Overal bed mobility: Needs Assistance Bed Mobility: Rolling Rolling: Max assist;+2 for physical assistance   Supine to sit: Total assist Sit to supine: Total assist   General bed mobility comments: Pt able to attempt to initiate movement towards side of bed, laterally scooted her buttocks, maxA for trunk elevation and immediate sitting balance  Transfers                 General transfer comment: deferred, pt fatigue/BM  Ambulation/Gait                 Stairs             Wheelchair Mobility    Modified Rankin (Stroke Patients Only)       Balance Overall balance assessment: Needs assistance Sitting-balance support: Feet supported Sitting balance-Leahy Scale: Poor Sitting balance - Comments: Pt with improved seated balance, mod-occasional CGA to maintain, able to lean posteriorly/anteriorly with CGA for safety. Still with difficulty sitting upright. Postural control: Left lateral lean                                  Cognition Arousal/Alertness: Awake/alert Behavior During Therapy: WFL for tasks assessed/performed Overall Cognitive Status: No family/caregiver present to determine baseline cognitive functioning  General Comments: Pt alert, orientedx3, slow to respond/extended time needed but appropriate      Exercises Other Exercises Other Exercises: Patient able to sit EOB for 4-5 minutes, ranging from modA to maintain seated position to occasional CGA. Pt with L lateral lean. Other Exercises: Pt with change in tone in LLE this session compared to last. PT able to range L knee and flex hip, with bouts of increased tone. unable to DF L ankle.    General Comments        Pertinent Vitals/Pain Pain  Assessment: No/denies pain    Home Living                      Prior Function            PT Goals (current goals can now be found in the care plan section) Progress towards PT goals: Progressing toward goals    Frequency    7X/week      PT Plan Current plan remains appropriate    Co-evaluation              AM-PAC PT "6 Clicks" Mobility   Outcome Measure  Help needed turning from your back to your side while in a flat bed without using bedrails?: Total Help needed moving from lying on your back to sitting on the side of a flat bed without using bedrails?: Total Help needed moving to and from a bed to a chair (including a wheelchair)?: Total   Help needed to walk in hospital room?: Total Help needed climbing 3-5 steps with a railing? : Total 6 Click Score: 5    End of Session   Activity Tolerance: Patient limited by fatigue Patient left: in bed;with nursing/sitter in room Nurse Communication: Mobility status PT Visit Diagnosis: Other abnormalities of gait and mobility (R26.89);Muscle weakness (generalized) (M62.81);Difficulty in walking, not elsewhere classified (R26.2);Other symptoms and signs involving the nervous system (R29.898);Hemiplegia and hemiparesis Hemiplegia - Right/Left: Left Hemiplegia - dominant/non-dominant: Non-dominant Hemiplegia - caused by: Cerebral infarction     Time: [id number] PT Time Calculation (min) (ACUTE ONLY): 38 min  Charges:  $Therapeutic Activity: 23-37 mins $Neuromuscular Re-education: 8-22 mins                     Olga Coasteriana Tykeisha Peer PT, DPT 1:09 PM,03/12/19 [phone]

## 2019-03-12 NOTE — Progress Notes (Signed)
[facility] NAME: Erica Stevens    MR#:  [id number]  DATE OF BIRTH:  1951-06-10  SUBJECTIVE:  CHIEF COMPLAINT:   Chief Complaint  Patient presents with   Fall   Weakness   -Appears confused again this morning.  Left-sided weakness noted -MRI concerning for lacunar strokes.  TEE possibly tomorrow  REVIEW OF SYSTEMS:  Review of Systems  Constitutional: Positive for malaise/fatigue. Negative for chills and fever.  HENT: Negative for ear discharge, hearing loss and nosebleeds.   Eyes: Negative for blurred vision and double vision.  Respiratory: Negative for cough, shortness of breath and wheezing.   Cardiovascular: Negative for chest pain and palpitations.  Gastrointestinal: Negative for abdominal pain, constipation, diarrhea, nausea and vomiting.  Genitourinary: Negative for dysuria.  Musculoskeletal: Negative for myalgias.  Neurological: Positive for focal weakness. Negative for dizziness, seizures and headaches.  Psychiatric/Behavioral: Negative for depression.    DRUG ALLERGIES:   Allergies  Allergen Reactions   Erythromycin     Other reaction(s): Unknown   Ivp Dye [Iodinated Diagnostic Agents]    Sulfa Antibiotics     Stated turns orange   Sulfur     VITALS:  Blood pressure (!) 208/70, pulse (!) 54, temperature 98.4 F (36.9 C), temperature source Oral, resp. rate 16, height 5' (1.524 m), weight 59 kg, SpO2 99 %.  PHYSICAL EXAMINATION:  Physical Exam   GENERAL:  68 y.o.-year-old elderly patient lying in the bed with no acute distress.  EYES: Pupils equal, round, reactive to light and accommodation. No scleral icterus. Extraocular muscles intact.  HEENT: Head atraumatic, normocephalic. Oropharynx and nasopharynx clear.  NECK:  Supple, no jugular venous distention. No thyroid enlargement, no tenderness.  LUNGS: Normal breath sounds bilaterally, no wheezing, rales,rhonchi or crepitation. No use of  accessory muscles of respiration.  Decreased bibasilar breath sounds CARDIOVASCULAR: S1, S2 normal. No  rubs, or gallops.  3/6 systolic murmur present ABDOMEN: Soft, nontender, nondistended. Bowel sounds present. No organomegaly or mass.  EXTREMITIES: No pedal edema, cyanosis, or clubbing.  NEUROLOGIC: Cranial nerves II through XII are intact.  Able to move right upper and lower extremity against gravity, however only moving left upper and lower extremity to commands slightly.  However has 2+ reflexes equal in both extremities.   Sensation intact. Gait not checked.  PSYCHIATRIC: The patient is alert and oriented x 1-2.  SKIN: No obvious rash, lesion, or ulcer.    LABORATORY PANEL:   CBC Recent Labs  Lab 03/11/19 0457  WBC 7.6  HGB 13.3  HCT 40.1  PLT 261   ------------------------------------------------------------------------------------------------------------------  Chemistries  Recent Labs  Lab 03/12/19 0458  NA 138  K 3.8  CL 106  CO2 25  GLUCOSE 95  BUN 15  CREATININE 0.82  CALCIUM 8.8*   ------------------------------------------------------------------------------------------------------------------  Cardiac Enzymes No results for input(s): TROPONINI in the last 168 hours. ------------------------------------------------------------------------------------------------------------------  RADIOLOGY:  Ct Head Wo Contrast  Result Date: 03/10/2019 CLINICAL DATA:  Fall, altered level of consciousness. EXAM: CT HEAD WITHOUT CONTRAST TECHNIQUE: Contiguous axial images were obtained from the base of the skull through the vertex without intravenous contrast. COMPARISON:  12/05/2018 FINDINGS: Brain: There is atrophy and chronic small vessel disease changes. Bilateral lacunar infarcts. No acute intracranial abnormality. Specifically, no hemorrhage, hydrocephalus, mass lesion, acute infarction, or significant intracranial injury. Vascular: No hyperdense vessel or unexpected  calcification. Skull: No acute calvarial abnormality. Sinuses/Orbits: Visualized paranasal sinuses and mastoids clear. Orbital soft tissues unremarkable. Other: None  IMPRESSION: Old bilateral basal ganglia lacunar infarcts. Atrophy, chronic microvascular disease. No acute intracranial abnormality. Electronically Signed   By: Charlett NoseKevin  Dover M.D.   On: 03/10/2019 23:11   Ct Lumbar Spine Wo Contrast  Result Date: 03/10/2019 CLINICAL DATA:  Weakness, back pain, fell earlier today, dementia EXAM: CT LUMBAR SPINE WITHOUT CONTRAST TECHNIQUE: Multidetector CT imaging of the lumbar spine was performed without intravenous contrast administration. Multiplanar CT image reconstructions were also generated. COMPARISON:  Lumbar spine radiographs 05/22/2012 FINDINGS: Segmentation: Prior lumbar radiographs demonstrate 5 non-rib-bearing lumbar type vertebra. Alignment: Normal Vertebrae: Diffuse osseous demineralization. Lumbar vertebra normal in height and alignment. No lumbar fracture, subluxation, or bone destruction. However, significant compression deformity of the T12 vertebral body is identified with approximately 60-70% anterior height loss, progressive since 02/25/2017. No additional lumbar fractures. Visualized pelvis intact. Paraspinal and other soft tissues: Probable vascular calcifications at LEFT kidney. RIGHT renal mass 3.4 x 3.2 cm, low attenuation consistent with cyst. Atherosclerotic calcifications aorta and iliac arteries. Disc levels: No significant disc abnormalities at any level. IMPRESSION: Progressive compression fracture of the T12 vertebral body now demonstrating 60-70% height loss, increased since 02/25/2017. Osseous demineralization without additional acute bony abnormalities. RIGHT renal cyst 3.4 x 3.2 cm; character of this lesion is poorly assessed by this CT exam, recommend follow-up non emergent renal sonographic characterization. Electronically Signed   By: Ulyses SouthwardMark  Boles M.D.   On: 03/10/2019 23:19    Mr Brain Wo Contrast  Result Date: 03/11/2019 CLINICAL DATA:  68 y/o F; fall at home and weakness. History of dementia. EXAM: MRI HEAD WITHOUT CONTRAST TECHNIQUE: Multiplanar, multiecho pulse sequences of the brain and surrounding structures were obtained without intravenous contrast. COMPARISON:  03/10/2019 CT head.  11/09/2018 MRI head. FINDINGS: Brain: Linear focus of reduced diffusion spanning approximately 2 cm extending from the right lentiform nucleus to the posterior frontal periventricular white matter (series 9 images 26-32 and series 11 images 19-22) compatible with acute/early subacute infarction. There is an additional subcentimeter focus of intermediate diffusion within the left corona radiata (series 9, image 32) consistent with subacute infarct. There is a small chronic infarction within the right anterior basal ganglia. Early to large confluent nonspecific T2 FLAIR hyperintensities in subcortical and periventricular white matter are compatible with moderate to severe chronic microvascular ischemic changes. Stable volume loss of the brain and ventricular enlargement. No new susceptibility hypo hypointensity to indicate inter intracranial hemorrhage. No mass effect, extra-axial collection, or herniation. Vascular: Normal flow voids. Skull and upper cervical spine: Normal marrow signal. Sinuses/Orbits: Negative. Other: None. IMPRESSION: 1. Small acute/early subacute lacunar infarction of right basal ganglia extending from right lentiform nucleus to posterior frontal periventricular white matter. No hemorrhage or mass effect. 2. New subcentimeter subacute infarction within left corona radiata. 3. Stable background of advanced chronic microvascular ischemic changes and volume loss of the brain. Stable chronic infarction within the right anterior basal ganglia. These results were called by telephone at the time of interpretation on 03/11/2019 at 2:05 am to Dr. Dolores FrameSung, who verbally acknowledged these  results. Electronically Signed   By: Mitzi HansenLance  Furusawa-Stratton M.D.   On: 03/11/2019 02:07   [place] Carotid Bilateral (at [facility] Only)  Result Date: 03/11/2019 CLINICAL DATA:  Stroke. History of hypertension, smoking and CAD (coronary bypass grafting) EXAM: BILATERAL CAROTID DUPLEX ULTRASOUND TECHNIQUE: Wallace CullensGray scale imaging, color Doppler and duplex ultrasound were performed of bilateral carotid and vertebral arteries in the neck. COMPARISON:  None. FINDINGS: Criteria: Quantification of carotid stenosis is based on velocity parameters  that correlate the residual internal carotid diameter with NASCET-based stenosis levels, using the diameter of the distal internal carotid lumen as the denominator for stenosis measurement. The following velocity measurements were obtained: RIGHT ICA: 76/20 cm/sec CCA: 107/17 cm/sec SYSTOLIC ICA/CCA RATIO:  0.7 ECA: 182 cm/sec LEFT ICA: 117/34 cm/sec CCA: 87/19 cm/sec SYSTOLIC ICA/CCA RATIO:  1.3 ECA: 210 cm/sec RIGHT CAROTID ARTERY: There is a minimal to moderate amount of eccentric echogenic plaque within the right carotid bulb (image 16), extending to involve the origin and proximal aspects of the right internal carotid artery (image 23, not resulting in elevated peak systolic velocities within the interrogated course the right internal carotid artery to suggest a hemodynamically significant stenosis. RIGHT VERTEBRAL ARTERY:  Antegrade flow LEFT CAROTID ARTERY: There is a minimal to moderate amount of eccentric echogenic plaque within the left carotid bulb (image 49), extending to involve the origin and proximal aspects of the left internal carotid artery (image 56), not resulting in elevated peak systolic velocities within the interrogated course the left internal carotid artery to suggest a hemodynamically significant stenosis. LEFT VERTEBRAL ARTERY:  Antegrade flow IMPRESSION: Minimal to moderate amount of bilateral atherosclerotic plaque, not resulting in a hemodynamically  significant stenosis within either internal carotid artery. Electronically Signed   By: Simonne ComeJohn  Watts M.D.   On: 03/11/2019 08:32   Dg Chest Portable 1 View  Result Date: 03/10/2019 CLINICAL DATA:  Weakness, fell earlier today, dementia, history CHF, chronic kidney disease, coronary artery disease, smoker EXAM: PORTABLE CHEST 1 VIEW COMPARISON:  Portable exam 2228 hours compared to 02/28/2017 FINDINGS: Minimal enlargement of cardiac silhouette post CABG. Mediastinal contours and pulmonary vascularity normal. Atherosclerotic calcifications aorta. Lungs clear. No definite infiltrate, pleural effusion or pneumothorax. Bones demineralized. IMPRESSION: Post CABG. No acute abnormalities. Electronically Signed   By: Ulyses SouthwardMark  Boles M.D.   On: 03/10/2019 22:57    EKG:   Orders placed or performed during the hospital encounter of 03/10/19   EKG 12-Lead   EKG 12-Lead   ED EKG   ED EKG   EKG    ASSESSMENT AND PLAN:    68 year old female with past medical history significant for CAD status post CABG, osteoporosis, hypertension, degenerative disc disease presents to   the hospital secondary to falls and left-sided weakness.  1.  Stroke-MRI of the brain showing small acute/early subacute infarction in right basal ganglia and left corona radiata. -  Chronic small vessel ischemic changes noted.  Patient also has left-sided weakness. -- carotid Dopplers with no hemodynamically significant stenosis and TTE with negative bubble study.  No clot noted. -Given multiple infarcts, patient will need a TEE and prolonged cardiac monitoring as outpatient to rule out underlying cardiac arrhythmia.. -Appreciate neurology consult -Patient started on aspirin and Plavix and statin.  Continue to monitor blood pressure.  Dual antiplatelet for 1 month followed by just Plavix only -PT/OT and speech therapy consults requested -Will need SNF at discharge  2.  Hypertension-permissive hypertension allowed given acute stroke -  will restart home medications today.  Continue IV hydralazine PRN  3.  Dementia-pleasantly confused.  Continue Aricept  4.  Hypokalemia-replaced  5.  DVT prophylaxis-Lovenox  Possible discharge tomorrow to SNF   All the records are reviewed and case discussed with Care Management/Social Workerr. Management plans discussed with the patient, family and they are in agreement.  CODE STATUS: Full code  TOTAL TIME TAKING CARE OF THIS PATIENT: 38 minutes.   POSSIBLE D/C IN 1-2 DAYS, DEPENDING ON CLINICAL CONDITION.  Enid Baasadhika Teodora Baumgarten M.D on 03/12/2019 at 8:59 AM  Between 7am to 6pm - Pager - [phone]  After 6pm go to [email or web address] - Social research officer, governmentpassword EPAS [facility]  [facility]  Office  [phone]  CC: Primary care physician; Alba CorySowles, Krichna, MD

## 2019-03-13 LAB — URINE CULTURE
Culture: 80000 — AB
Special Requests: NORMAL

## 2019-03-13 MED ORDER — AMLODIPINE BESYLATE 10 MG PO TABS
10.0000 mg | ORAL_TABLET | Freq: Every day | ORAL | Status: DC
  Administered 2019-03-14: 10 mg via ORAL
  Filled 2019-03-13: qty 1

## 2019-03-13 MED ORDER — AMLODIPINE BESYLATE 5 MG PO TABS
5.0000 mg | ORAL_TABLET | Freq: Once | ORAL | Status: AC
  Administered 2019-03-13: 5 mg via ORAL
  Filled 2019-03-13: qty 1

## 2019-03-13 NOTE — TOC Progression Note (Addendum)
Transition of Care [facility]) - Progression Note    Patient Details  Name: Emy Angevine MRN: [id number] Date of Birth: Jul 05, 1951  [facility] of [facility]) CM/SW Contact  Katrina Stack, RN Phone Number: 03/13/2019, 3:17 PM  Clinical Narrative:   Patient's daughter accepted bed offer from [facility]. CM informed Roderic Palau  per phone and via the Hub.  Roderic Palau verbalized understanding that he will initiate auth and that anticipate discharge 03/14/2019. Covid negative 6/29. Facility will not require additional testing. Sent results via Hub    Expected Discharge Plan: [place] Barriers to Discharge: Continued Medical Work up  Expected Discharge Plan and Services Expected Discharge Plan: [place]   Discharge Planning Services: CM Consult   Living arrangements for the past 2 months: Single Family Home                                       Social Determinants of Health (SDOH) Interventions    Readmission Risk Interventions Readmission Risk Prevention Plan 03/11/2019  Transportation Screening Complete  PCP or Specialist Appt within 5-7 Days Complete  Home Care Screening Complete  Medication Review (RN CM) Complete  Some recent data might be hidden

## 2019-03-13 NOTE — Progress Notes (Signed)
Patient educated on all starred CVA objectives except the video and the review of d/c medication instruction(s). Patient with a history of dementia and most likely wouldn't comprehend the video. Will continue to monitor educational requirements. [facility]

## 2019-03-13 NOTE — Progress Notes (Signed)
Physical Therapy Treatment Patient Details Name: Erica Stevens MRN: [id number] DOB: [birth year] Today's Date: 03/13/2019    History of Present Illness Pt presented to ED for weakness, fall. PMH of CKD, CABG, chronic pain syndrome, CHF, dementia, depression, HTN. MRI showed;Small acute/early subacute lacunar infarction of right basal ganglia extending from right lentiform nucleus to posterior frontal periventricular white matter. New subcentimeter subacute infarction within left corona radiata. Stable background of advanced chronic microvascular ischemic changes and volume loss of the brain. Stable chronic infarction within the right anterior basal ganglia. No hemorrhage or mass effect. Also, T12 compression fx now showing 60-70% loss of height.    PT Comments    Pt alert, oriented, though extended time needed this session to respond to simple one step commands. OT and PT co-treat this session to maximize pt functional mobility/activities. The pt was able to reach for bed rail/edge of bed with RUE, totalAx2 to achieve and maintain upright positioning, progressed to maxAx1 to maintain static balance initially. Occasionally minAx1 for balance. The patient was able to sit EOB for 8-23mins, verbal cues for upright posture, lifting her head, tactile cues for abdominal muscles and R shift towards midline. Pt intermittently complained of fear of falling/dizziness, exhibited fatigue at end of session. Pt returned to supine and bed placed in chair position. The patient would benefit from further skilled PT to continue to address limitations and maximize pt independence and function.    Follow Up Recommendations  SNF     Equipment Recommendations  Other (comment)    Recommendations for Other Services OT consult     Precautions / Restrictions Precautions Precautions: Fall Precaution Comments: L neglect Restrictions Weight Bearing Restrictions: No    Mobility  Bed Mobility Overal bed mobility:  Needs Assistance Bed Mobility: Supine to Sit;Sit to Supine Rolling: Max assist;+2 for physical assistance   Supine to sit: Total assist Sit to supine: Total assist   General bed mobility comments: Pt with extended time needed this session to respond to simple one step commands, able to reach for bed rail/edge of bed with RUE, totalAx2 to achieve and maintain upright positioning, progressed to maxAx1 to maintain static balance. Occasionally modAx1 for balance.  Transfers Overall transfer level: Needs assistance               General transfer comment: Deferred, pt fatigue  Ambulation/Gait                 Stairs             Wheelchair Mobility    Modified Rankin (Stroke Patients Only)       Balance Overall balance assessment: Needs assistance Sitting-balance support: Feet supported Sitting balance-Leahy Scale: Poor Sitting balance - Comments: Pt req. min to mod assist and consistent VCs to maintain seated balance on this date. Postural control: Left lateral lean                                  Cognition Arousal/Alertness: Awake/alert Behavior During Therapy: WFL for tasks assessed/performed Overall Cognitive Status: No family/caregiver present to determine baseline cognitive functioning                                 General Comments: Pt alert, orientedx3, slow to respond, some perseveration noted over IV and hand cramp      Exercises Other Exercises Other Exercises:  Pt assisted with sitting EOB for 8-10 min on this date. Required consistent assist (at least min a-modA) to maintain static balance EOB. Verbal cues for upright posture, tactile cues for abdominal muscles and R shift towards midline. Other Exercises: pt instructed in positioning of LUE for improved awareness, functional use, and improved safety.    General Comments        Pertinent Vitals/Pain Pain Assessment: Faces Faces Pain Scale: Hurts a little  bit Pain Location: Pt c/o cramping in her LUE and RUE/neck on this date. Pain Descriptors / Indicators: Cramping Pain Intervention(s): Limited activity within patient's tolerance;Monitored during session;Repositioned    Home Living                      Prior Function            PT Goals (current goals can now be found in the care plan section) Acute Rehab PT Goals Patient Stated Goal: Pt did not state any goals Progress towards PT goals: Progressing toward goals    Frequency    7X/week      PT Plan Current plan remains appropriate    Co-evaluation PT/OT/SLP Co-Evaluation/Treatment: Yes Reason for Co-Treatment: For patient/therapist safety;To address functional/ADL transfers PT goals addressed during session: Mobility/safety with mobility OT goals addressed during session: ADL's and self-care;Strengthening/ROM      AM-PAC PT "6 Clicks" Mobility   Outcome Measure  Help needed turning from your back to your side while in a flat bed without using bedrails?: Total Help needed moving from lying on your back to sitting on the side of a flat bed without using bedrails?: Total Help needed moving to and from a bed to a chair (including a wheelchair)?: Total Help needed standing up from a chair using your arms (e.g., wheelchair or bedside chair)?: Total Help needed to walk in hospital room?: Total Help needed climbing 3-5 steps with a railing? : Total 6 Click Score: 6    End of Session   Activity Tolerance: Patient limited by fatigue Patient left: in bed;with nursing/sitter in room;with call bell/phone within reach;with bed alarm set(bed in chair position) Nurse Communication: Mobility status PT Visit Diagnosis: Other abnormalities of gait and mobility (R26.89);Muscle weakness (generalized) (M62.81);Difficulty in walking, not elsewhere classified (R26.2);Other symptoms and signs involving the nervous system (R29.898);Hemiplegia and hemiparesis Hemiplegia - Right/Left:  Left Hemiplegia - dominant/non-dominant: Non-dominant Hemiplegia - caused by: Cerebral infarction     Time: [id number] PT Time Calculation (min) (ACUTE ONLY): 37 min  Charges:  $Neuromuscular Re-education: 8-22 mins                     Olga Coasteriana Bailyn Spackman PT, DPT 1:48 PM,03/13/19 [phone]

## 2019-03-13 NOTE — Progress Notes (Signed)
[employer] [facility] [place] at [facility]   PATIENT NAME: Erica HalimSusan Northrop    MR#:  [id number]  DATE OF BIRTH:  1951/04/03  SUBJECTIVE:  CHIEF COMPLAINT:   Chief Complaint  Patient presents with  . Fall  . Weakness   -Appears confused again this morning.  Sleepy this morning -Blood pressure remains elevated.  Patient refused TEE  REVIEW OF SYSTEMS:  Review of Systems  Constitutional: Positive for malaise/fatigue. Negative for chills and fever.  HENT: Negative for ear discharge, hearing loss and nosebleeds.   Eyes: Negative for blurred vision and double vision.  Respiratory: Negative for cough, shortness of breath and wheezing.   Cardiovascular: Negative for chest pain and palpitations.  Gastrointestinal: Negative for abdominal pain, constipation, diarrhea, nausea and vomiting.  Genitourinary: Negative for dysuria.  Musculoskeletal: Negative for myalgias.  Neurological: Positive for focal weakness. Negative for dizziness, seizures and headaches.  Psychiatric/Behavioral: Negative for depression.    DRUG ALLERGIES:   Allergies  Allergen Reactions  . Erythromycin     Other reaction(s): Unknown  . Ivp Dye [Iodinated Diagnostic Agents]   . Sulfa Antibiotics     Stated turns orange  . Sulfur     VITALS:  Blood pressure (!) 180/63, pulse (!) 54, temperature 97.9 F (36.6 C), temperature source Oral, resp. rate 16, height 5' (1.524 m), weight 59 kg, SpO2 99 %.  PHYSICAL EXAMINATION:  Physical Exam   GENERAL:  68 y.o.-year-old elderly patient lying in the bed with no acute distress.  EYES: Pupils equal, round, reactive to light and accommodation. No scleral icterus. Extraocular muscles intact.  HEENT: Head atraumatic, normocephalic. Oropharynx and nasopharynx clear.  NECK:  Supple, no jugular venous distention. No thyroid enlargement, no tenderness.  LUNGS: Normal breath sounds bilaterally, no wheezing, rales,rhonchi or crepitation. No use of accessory muscles  of respiration.  Decreased bibasilar breath sounds CARDIOVASCULAR: S1, S2 normal. No  rubs, or gallops.  3/6 systolic murmur present ABDOMEN: Soft, nontender, nondistended. Bowel sounds present. No organomegaly or mass.  EXTREMITIES: No pedal edema, cyanosis, or clubbing.  NEUROLOGIC: Cranial nerves II through XII are intact.  Able to move right upper and lower extremity against gravity, however only moving left upper and lower extremity to commands slightly.  However has 2+ reflexes equal in both extremities.   Sensation intact. Gait not checked.  PSYCHIATRIC: The patient is somnolent, arousable and and oriented x 1-2.  SKIN: No obvious rash, lesion, or ulcer.    LABORATORY PANEL:   CBC Recent Labs  Lab 03/11/19 0457  WBC 7.6  HGB 13.3  HCT 40.1  PLT 261   ------------------------------------------------------------------------------------------------------------------  Chemistries  Recent Labs  Lab 03/12/19 0458  NA 138  K 3.8  CL 106  CO2 25  GLUCOSE 95  BUN 15  CREATININE 0.82  CALCIUM 8.8*   ------------------------------------------------------------------------------------------------------------------  Cardiac Enzymes No results for input(s): TROPONINI in the last 168 hours. ------------------------------------------------------------------------------------------------------------------  RADIOLOGY:  No results found.  EKG:   Orders placed or performed during the hospital encounter of 03/10/19  . EKG 12-Lead  . EKG 12-Lead  . ED EKG  . ED EKG  . EKG    ASSESSMENT AND PLAN:    68 year old female with past medical history significant for CAD status post CABG, osteoporosis, hypertension, degenerative disc disease presents to   the hospital secondary to falls and left-sided weakness.  1.  Stroke-MRI of the brain showing small acute/early subacute infarction in right basal ganglia and left corona radiata. -  Chronic  small vessel ischemic changes noted.   Patient also has left-sided weakness. -- carotid Dopplers with no hemodynamically significant stenosis and TTE with negative bubble study.  No clot noted. -Given multiple infarcts, patient will need a TEE and prolonged cardiac monitoring as outpatient to rule out underlying cardiac arrhythmia.. Patient refused a TEE.  Appreciate cardiology input.  Just recommend outpatient prolonged cardiac monitoring at this time -Appreciate neurology consult -Patient started on aspirin and Plavix and statin.  Continue to monitor blood pressure.  Dual antiplatelet for 1 month followed by just Plavix only -PT/OT and speech therapy consults requested -Will need SNF at discharge  2.  Hypertension-permissive hypertension allowed given acute stroke -On Norvasc, dose being adjusted for now.  Continue IV hydralazine PRN  3.  Dementia-pleasantly confused.  Continue Aricept  4.  Hypokalemia-replaced  5.  DVT prophylaxis-Lovenox  Possible discharge  to SNF after insurance authorization   All the records are reviewed and case discussed with Care Management/Social Workerr. Management plans discussed with the patient, family and they are in agreement.  CODE STATUS: Full code  TOTAL TIME TAKING CARE OF THIS PATIENT: 36 minutes.   POSSIBLE D/C IN 1-2 DAYS, DEPENDING ON CLINICAL CONDITION.   Gladstone Lighter M.D on 03/13/2019 at 12:50 PM  Between 7am to 6pm - Pager - [phone]  After 6pm go to [email or web address] - password [email or web address] [place] [facility]  Office  [phone]  CC: Primary care physician; Steele Sizer, MD

## 2019-03-13 NOTE — Plan of Care (Signed)
  Problem: Education: Goal: Knowledge of General Education information will improve Description: Including pain rating scale, medication(s)/side effects and non-pharmacologic comfort measures Outcome: Not Progressing Note: Patient with a CVA discovered this admission and a history of dementia. Will continue to monitor neurological status. Wenda Low [facility]

## 2019-03-13 NOTE — Progress Notes (Signed)
Occupational Therapy Treatment Patient Details Name: Erica Stevens MRN: [id number] DOB: 15-Nov-1950 Today's Date: 03/13/2019    History of present illness Pt presented to ED for weakness, fall. PMH of CKD, CABG, chronic pain syndrome, CHF, dementia, depression, HTN. MRI showed;Small acute/early subacute lacunar infarction of right basal ganglia extending from right lentiform nucleus to posterior frontal periventricular white matter. New subcentimeter subacute infarction within left corona radiata. Stable background of advanced chronic microvascular ischemic changes and volume loss of the brain. Stable chronic infarction within the right anterior basal ganglia. No hemorrhage or mass effect. Also, T12 compression fx now showing 60-70% loss of height.   OT comments  Pt seen for OT/Pt co-treatment on this date. Upon arrival to room pt awake/alert, semi-supine in bed, with breakfast tray. Pt c/o LUE/RUE hand cramping on this date. LUE noted to have increased flexor tone, but still able to complete PROM throughout all ranges with facilitation form therapist on this date. LUE observed to automatically flex inward once released from passive extension. Pt denies any LUE pain with movement. OT/PT facilitated sup<>sit transfers on this date. Pt req. max assist to complete transfers, but is able to begin to bring her RLE toward EOB during sup>sit and attempts to facilitate transfer with RUE on bed rail. Pt able to tolerate sitting EOB with min/mod +2 assist from therapists. Unable to engage in dynamic sitting balance activities. Pt expresses fear of falling with attempts to engage in dynamic sitting balance. Would not be able to complete sitting ADLs at present level. Pt making progress toward goals. Pt continues to benefit from skilled OT services to maximize return to PLOF and minimize risk of future falls, injury, caregiver burden, and readmission. Will continue to follow POC. Discharge recommendation remains  appropriate.    Follow Up Recommendations  SNF    Equipment Recommendations  Other (comment)(TBD)    Recommendations for Other Services      Precautions / Restrictions Precautions Precautions: Fall Precaution Comments: L neglect Restrictions Weight Bearing Restrictions: No       Mobility Bed Mobility Overal bed mobility: Needs Assistance Bed Mobility: Supine to Sit;Sit to Supine Rolling: Max assist;+2 for physical assistance   Supine to sit: Total assist Sit to supine: Total assist   General bed mobility comments: Pt able to attempt to initiate movement towards side of bed, laterally scooted her buttocks, maxA for trunk elevation and immediate sitting balance  Transfers Overall transfer level: Needs assistance               General transfer comment: Deferred, pt fatigue    Balance Overall balance assessment: Needs assistance Sitting-balance support: Feet supported Sitting balance-Leahy Scale: Poor Sitting balance - Comments: Pt req. min to mod assist and consistent VCs to maintain seated balance on this date. Postural control: Left lateral lean                                 ADL either performed or assessed with clinical judgement   ADL Overall ADL's : Needs assistance/impaired Eating/Feeding: Set up;Minimal assistance;Bed level                                     General ADL Comments: Pt demonstrated improved awareness to L side on this date, however LUE appears to have increased flexor tone. Pt able to achieve full PROM with gentle  passive stretching and increased time. Unable to incorporate LUE into ADL tasks at this time.     Vision   Vision Assessment?: Vision impaired- to be further tested in functional context;Yes   Perception     Praxis      Cognition Arousal/Alertness: Awake/alert Behavior During Therapy: WFL for tasks assessed/performed Overall Cognitive Status: No family/caregiver present to determine  baseline cognitive functioning                                 General Comments: Pt alert, orientedx3, slow to respond and requires cues to terminate task        Exercises Other Exercises Other Exercises: Pt assisted with sitting EOB for 8-10 min on this date. Required consistent assist (at least min a) to maintain static balance EOB. OT facilitated WB through LUE during sitting on this date. Pt states she is feeling LUE more than prior sessions. Other Exercises: pt instructed in positioning of LUE for improved awareness, functional use, and improved safety.   Shoulder Instructions       General Comments      Pertinent Vitals/ Pain       Pain Assessment: Faces Faces Pain Scale: Hurts a little bit Pain Location: Pt c/o cramping in her LUE and RUE/neck on this date. Pain Descriptors / Indicators: Cramping Pain Intervention(s): Limited activity within patient's tolerance;Monitored during session;Repositioned  Home Living                                          Prior Functioning/Environment              Frequency  Min 2X/week        Progress Toward Goals  OT Goals(current goals can now be found in the care plan section)  Progress towards OT goals: Progressing toward goals  Acute Rehab OT Goals Patient Stated Goal: Pt did not state any goals  Plan Discharge plan remains appropriate;Frequency remains appropriate    Co-evaluation    PT/OT/SLP Co-Evaluation/Treatment: Yes Reason for Co-Treatment: For patient/therapist safety;To address functional/ADL transfers PT goals addressed during session: Mobility/safety with mobility;Balance OT goals addressed during session: ADL's and self-care;Strengthening/ROM      AM-PAC OT "6 Clicks" Daily Activity     Outcome Measure   Help from another person eating meals?: A Little Help from another person taking care of personal grooming?: A Lot Help from another person toileting, which includes  using toliet, bedpan, or urinal?: A Lot Help from another person bathing (including washing, rinsing, drying)?: A Lot Help from another person to put on and taking off regular upper body clothing?: A Lot Help from another person to put on and taking off regular lower body clothing?: A Lot 6 Click Score: 13    End of Session    OT Visit Diagnosis: Hemiplegia and hemiparesis;Other abnormalities of gait and mobility (R26.89) Hemiplegia - Right/Left: Left Hemiplegia - dominant/non-dominant: Non-Dominant Hemiplegia - caused by: Cerebral infarction   Activity Tolerance Patient tolerated treatment well   Patient Left in bed;with call bell/phone within reach;with bed alarm set   Nurse Communication          Time: 1610-9604: 1046-1123 OT Time Calculation (min): 37 min  Charges: OT General Charges $OT Visit: 1 Visit OT Treatments $Self Care/Home Management : 8-22 mins  Rockney GheeSerenity Adeyemi Hamad, M.S., OTR/L Ascom: [phone]  03/13/19, 1:08 PM

## 2019-03-14 DIAGNOSIS — Z7401 Bed confinement status: Secondary | ICD-10-CM | POA: Diagnosis not present

## 2019-03-14 DIAGNOSIS — R41841 Cognitive communication deficit: Secondary | ICD-10-CM | POA: Diagnosis not present

## 2019-03-14 DIAGNOSIS — N3000 Acute cystitis without hematuria: Secondary | ICD-10-CM | POA: Diagnosis not present

## 2019-03-14 DIAGNOSIS — F0390 Unspecified dementia without behavioral disturbance: Secondary | ICD-10-CM | POA: Diagnosis not present

## 2019-03-14 DIAGNOSIS — Z8673 Personal history of transient ischemic attack (TIA), and cerebral infarction without residual deficits: Secondary | ICD-10-CM | POA: Diagnosis not present

## 2019-03-14 DIAGNOSIS — Z9181 History of falling: Secondary | ICD-10-CM | POA: Diagnosis not present

## 2019-03-14 DIAGNOSIS — R531 Weakness: Secondary | ICD-10-CM | POA: Diagnosis not present

## 2019-03-14 DIAGNOSIS — Z515 Encounter for palliative care: Secondary | ICD-10-CM | POA: Diagnosis not present

## 2019-03-14 DIAGNOSIS — I6350 Cerebral infarction due to unspecified occlusion or stenosis of unspecified cerebral artery: Secondary | ICD-10-CM | POA: Diagnosis not present

## 2019-03-14 DIAGNOSIS — R5381 Other malaise: Secondary | ICD-10-CM | POA: Diagnosis not present

## 2019-03-14 DIAGNOSIS — M2550 Pain in unspecified joint: Secondary | ICD-10-CM | POA: Diagnosis not present

## 2019-03-14 DIAGNOSIS — R2689 Other abnormalities of gait and mobility: Secondary | ICD-10-CM | POA: Diagnosis not present

## 2019-03-14 DIAGNOSIS — R404 Transient alteration of awareness: Secondary | ICD-10-CM | POA: Diagnosis not present

## 2019-03-14 DIAGNOSIS — E785 Hyperlipidemia, unspecified: Secondary | ICD-10-CM | POA: Diagnosis not present

## 2019-03-14 DIAGNOSIS — I10 Essential (primary) hypertension: Secondary | ICD-10-CM | POA: Diagnosis not present

## 2019-03-14 DIAGNOSIS — I5022 Chronic systolic (congestive) heart failure: Secondary | ICD-10-CM | POA: Diagnosis not present

## 2019-03-14 DIAGNOSIS — I639 Cerebral infarction, unspecified: Secondary | ICD-10-CM | POA: Diagnosis not present

## 2019-03-14 DIAGNOSIS — M6281 Muscle weakness (generalized): Secondary | ICD-10-CM | POA: Diagnosis not present

## 2019-03-14 DIAGNOSIS — I2510 Atherosclerotic heart disease of native coronary artery without angina pectoris: Secondary | ICD-10-CM | POA: Diagnosis not present

## 2019-03-14 DIAGNOSIS — R262 Difficulty in walking, not elsewhere classified: Secondary | ICD-10-CM | POA: Diagnosis not present

## 2019-03-14 DIAGNOSIS — I69354 Hemiplegia and hemiparesis following cerebral infarction affecting left non-dominant side: Secondary | ICD-10-CM | POA: Diagnosis not present

## 2019-03-14 DIAGNOSIS — I69391 Dysphagia following cerebral infarction: Secondary | ICD-10-CM | POA: Diagnosis not present

## 2019-03-14 MED ORDER — AMLODIPINE BESYLATE 10 MG PO TABS: 10.0000 mg | ORAL_TABLET | Freq: Every day | ORAL | 2 refills | Status: AC

## 2019-03-14 MED ORDER — NICOTINE 21 MG/24HR TD PT24: 21.0000 mg | MEDICATED_PATCH | TRANSDERMAL | 1 refills | Status: DC

## 2019-03-14 MED ORDER — ENALAPRIL MALEATE 20 MG PO TABS: 20.0000 mg | ORAL_TABLET | Freq: Every day | ORAL | 2 refills | Status: AC

## 2019-03-14 MED ORDER — BOOST / RESOURCE BREEZE PO LIQD CUSTOM: 1.0000 | Freq: Three times a day (TID) | ORAL | Status: DC

## 2019-03-14 MED ORDER — CLOPIDOGREL BISULFATE 75 MG PO TABS: 75.0000 mg | ORAL_TABLET | Freq: Every day | ORAL | 2 refills | Status: AC

## 2019-03-14 MED ORDER — NICOTINE 21 MG/24HR TD PT24: 21.0000 mg | MEDICATED_PATCH | Freq: Every day | TRANSDERMAL | Status: DC

## 2019-03-14 NOTE — Progress Notes (Signed)
[employer] - [place] at [facility]   PATIENT NAME: Erica Stevens    MR#:  [id number]  DATE OF BIRTH:  06/30/1951  SUBJECTIVE:  CHIEF COMPLAINT:   Chief Complaint  Patient presents with  . Fall  . Weakness   -Significant change.  Awaiting insurance authorization to go to rehab.  REVIEW OF SYSTEMS:  Review of Systems  Constitutional: Positive for malaise/fatigue. Negative for chills and fever.  HENT: Negative for ear discharge, hearing loss and nosebleeds.   Eyes: Negative for blurred vision and double vision.  Respiratory: Negative for cough, shortness of breath and wheezing.   Cardiovascular: Negative for chest pain and palpitations.  Gastrointestinal: Negative for abdominal pain, constipation, diarrhea, nausea and vomiting.  Genitourinary: Negative for dysuria.  Musculoskeletal: Negative for myalgias.  Neurological: Positive for focal weakness. Negative for dizziness, seizures and headaches.  Psychiatric/Behavioral: Negative for depression.    DRUG ALLERGIES:   Allergies  Allergen Reactions  . Erythromycin     Other reaction(s): Unknown  . Ivp Dye [Iodinated Diagnostic Agents]   . Sulfa Antibiotics     Stated turns orange  . Sulfur     VITALS:  Blood pressure (!) 164/80, pulse 71, temperature 98.4 F (36.9 C), resp. rate 18, height 5' (1.524 m), weight 59 kg, SpO2 100 %.  PHYSICAL EXAMINATION:  Physical Exam   GENERAL:  68 y.o.-year-old elderly patient lying in the bed with no acute distress.  EYES: Pupils equal, round, reactive to light and accommodation. No scleral icterus. Extraocular muscles intact.  HEENT: Head atraumatic, normocephalic. Oropharynx and nasopharynx clear.  NECK:  Supple, no jugular venous distention. No thyroid enlargement, no tenderness.  LUNGS: Normal breath sounds bilaterally, no wheezing, rales,rhonchi or crepitation. No use of accessory muscles of respiration.  Decreased bibasilar breath sounds CARDIOVASCULAR: S1,  S2 normal. No  rubs, or gallops.  3/6 systolic murmur present ABDOMEN: Soft, nontender, nondistended. Bowel sounds present. No organomegaly or mass.  EXTREMITIES: No pedal edema, cyanosis, or clubbing.  NEUROLOGIC: Cranial nerves II through XII are intact.  Able to move right upper and lower extremity against gravity, however only moving left upper and lower extremity to commands slightly.  However has 2+ reflexes equal in both extremities.   Sensation intact. Gait not checked.  PSYCHIATRIC: The patient is somnolent, arousable and and oriented to self SKIN: No obvious rash, lesion, or ulcer.    LABORATORY PANEL:   CBC Recent Labs  Lab 03/11/19 0457  WBC 7.6  HGB 13.3  HCT 40.1  PLT 261   ------------------------------------------------------------------------------------------------------------------  Chemistries  Recent Labs  Lab 03/12/19 0458  NA 138  K 3.8  CL 106  CO2 25  GLUCOSE 95  BUN 15  CREATININE 0.82  CALCIUM 8.8*   ------------------------------------------------------------------------------------------------------------------  Cardiac Enzymes No results for input(s): TROPONINI in the last 168 hours. ------------------------------------------------------------------------------------------------------------------  RADIOLOGY:  No results found.  EKG:   Orders placed or performed during the hospital encounter of 03/10/19  . EKG 12-Lead  . EKG 12-Lead  . ED EKG  . ED EKG  . EKG    ASSESSMENT AND PLAN:    68 year old female with past medical history significant for CAD status post CABG, osteoporosis, hypertension, degenerative disc disease presents to   the hospital secondary to falls and left-sided weakness.  1.  Acute stroke-MRI of the brain showing small acute/early subacute infarction in right basal ganglia and left corona radiata. -  Chronic small vessel ischemic changes noted.  Patient also has left-sided weakness. --  carotid Dopplers with no  hemodynamically significant stenosis and TTE with negative bubble study.  No clot noted. -Given multiple infarcts, patient will need a TEE and prolonged cardiac monitoring as outpatient to rule out underlying cardiac arrhythmia.. Patient refused a TEE.  Appreciate cardiology input.  Just recommend outpatient prolonged cardiac monitoring at this time -Appreciate neurology consult -Patient started on aspirin and Plavix and statin.  Continue to monitor blood pressure.  Dual antiplatelet for 1 month followed by just Plavix only -PT/OT and speech therapy consults requested -Will need SNF at discharge  2.  Hypertension-permissive hypertension allowed given acute stroke -On Norvasc, dose being adjusted for now.  Continue IV hydralazine PRN  3.  Dementia-pleasantly confused.  Continue Aricept  4.  Hypokalemia-replaced  5.  DVT prophylaxis-Lovenox  Possible discharge  to SNF after insurance authorization   All the records are reviewed and case discussed with Care Management/Social Workerr. Management plans discussed with the patient, family and they are in agreement.  CODE STATUS: Full code  TOTAL TIME TAKING CARE OF THIS PATIENT: 36 minutes.   POSSIBLE D/C IN 1-2 DAYS, DEPENDING ON CLINICAL CONDITION.   Gladstone Lighter M.D on 03/14/2019 at 10:42 AM  Between 7am to 6pm - Pager - [phone]  After 6pm go to [email or web address] - password [email or web address] [facility]  Office  [phone]  CC: Primary care physician; Steele Sizer, MD

## 2019-03-14 NOTE — TOC Progression Note (Signed)
Transition of Care [facility]) - Progression Note    Patient Details  Name: Erica Stevens MRN: [id number] Date of Birth: June 04, 1951  Transition of Care [facility]) CM/SW Contact  Tymeer Vaquera, Lenice Llamas Phone Number: [phone]  03/14/2019, 9:53 AM  Clinical Narrative: Per Angelica Chessman administrator at [facility] SNF authorization is pending.      Expected Discharge Plan: Skilled Nursing Facility Barriers to Discharge: Continued Medical Work up  Expected Discharge Plan and Services Expected Discharge Plan: [place]   Discharge Planning Services: CM Consult   Living arrangements for the past 2 months: Single Family Home                                       Social Determinants of Health (SDOH) Interventions    Readmission Risk Interventions Readmission Risk Prevention Plan 03/11/2019  Transportation Screening Complete  PCP or Specialist Appt within 5-7 Days Complete  Home Care Screening Complete  Medication Review (RN CM) Complete  Some recent data might be hidden

## 2019-03-14 NOTE — Care Management Important Message (Addendum)
Important Message  Patient Details  Name: Erica Stevens MRN: [id number] Date of Birth: July 24, 1951   Medicare Important Message Given:  Yes  Initial Medicare IM given Patient [employer] on 03/14/2019.  Still valid.   Dannette Barbara 03/14/2019, 12:18 PM

## 2019-03-14 NOTE — Discharge Summary (Addendum)
[employer] [facility] [place] [facility]   PATIENT NAME: Erica HalimSusan Hribar    MR#:  [id number]  DATE OF BIRTH:  Feb 03, 1951  DATE OF ADMISSION:  03/10/2019   ADMITTING PHYSICIAN: Oralia Manisavid Willis, MD  DATE OF DISCHARGE:  03/14/19  PRIMARY CARE PHYSICIAN: Alba CorySowles, Krichna, MD   ADMISSION DIAGNOSIS:   Stroke [facility](HCC) [I63.9] Acute cystitis without hematuria [N30.00] Fall, initial encounter [W19.XXXA]  DISCHARGE DIAGNOSIS:   Principal Problem:   Weakness Active Problems:   CAD (coronary artery disease) of artery bypass graft   Hyperlipidemia   Moderate dementia with behavioral disturbance (HCC)   Chronic systolic congestive heart failure (HCC)   Stroke (HCC)   SECONDARY DIAGNOSIS:   Past Medical History:  Diagnosis Date   Allergic rhinitis due to pollen    Anxiety    Brachial neuritis or radiculitis NOS    Cellulitis and abscess of unspecified site    Chronic kidney disease, stage I    Chronic pain syndrome    Congestive heart failure, unspecified    Coronary atherosclerosis of unspecified type of vessel, native or graft    Degeneration of cervical intervertebral disc    Depression    Hypertension    Migraine, unspecified, without mention of intractable migraine without mention of status migrainosus    Mitral valve disorders(424.0)    Osteoporosis, unspecified    Pure hypercholesterolemia    Reflux esophagitis    Thoracic or lumbosacral neuritis or radiculitis, unspecified    Unspecified urinary incontinence     HOSPITAL COURSE:   68 year-old female with past medical history significant for CAD status post CABG, osteoporosis, hypertension, degenerative disc disease presents to   the hospital secondary to falls and left-sided weakness.  1.  Acute stroke-MRI of the brain showing small acute/early subacute infarction in right basal ganglia and left corona radiata. -  Chronic small vessel ischemic changes noted.  Patient also has left-sided  weakness. -- carotid Dopplers with no hemodynamically significant stenosis and TTE with negative bubble study.  No clot noted. -Given multiple infarcts, patient will need a TEE and prolonged cardiac monitoring as outpatient to rule out underlying cardiac arrhythmia.. Patient refused a TEE.  Appreciate cardiology input.  Just recommend outpatient prolonged cardiac monitoring at this time -Appreciate neurology consult -Patient started on aspirin and Plavix and statin.  Continue to monitor blood pressure.  Dual antiplatelet for 1 month followed by just Plavix only -PT/OT and speech therapy consults requested -Will need SNF at discharge  2.  Hypertension-initially permissive hypertension allowed given acute stroke -On Norvasc and enalapril   3.  Dementia-pleasantly confused.  Continue Aricept  4.  Hypokalemia-replaced   discharge  to SNF today Possibly will need long-term care after rehab   DISCHARGE CONDITIONS:   Guarded  CONSULTS OBTAINED:   Treatment Team:  Kym Groomriadhosp, Neuro1, MD  DRUG ALLERGIES:   Allergies  Allergen Reactions   Erythromycin     Other reaction(s): Unknown   Ivp Dye [Iodinated Diagnostic Agents]    Sulfa Antibiotics     Stated turns orange   Sulfur    DISCHARGE MEDICATIONS:   Allergies as of 03/14/2019      Reactions   Erythromycin    Other reaction(s): Unknown   Ivp Dye [iodinated Diagnostic Agents]    Sulfa Antibiotics    Stated turns orange   Sulfur       Medication List    TAKE these medications   amLODipine 10 MG tablet Commonly known as: NORVASC Take 1  tablet (10 mg total) by mouth daily. Start taking on: March 15, 2019 What changed:   medication strength  how much to take   aspirin 81 MG tablet Take 1 tablet (81 mg total) by mouth daily.   atorvastatin 80 MG tablet Commonly known as: LIPITOR Take 1 tablet (80 mg total) by mouth daily.   clopidogrel 75 MG tablet Commonly known as: PLAVIX Take 1 tablet (75 mg total) by  mouth daily. Start taking on: March 15, 2019   donepezil 5 MG tablet Commonly known as: ARICEPT Take 5 mg by mouth daily.   enalapril 20 MG tablet Commonly known as: VASOTEC Take 1 tablet (20 mg total) by mouth daily. What changed:   medication strength  how much to take  when to take this   metoprolol succinate 25 MG 24 hr tablet Commonly known as: TOPROL-XL TAKE ONE (1) TABLET EACH DAY What changed:   how much to take  how to take this  when to take this  additional instructions   nicotine 21 mg/24hr patch Commonly known as: NICODERM CQ - dosed in mg/24 hours Place 1 patch (21 mg total) onto the skin daily.   vitamin B-12 500 MCG tablet Commonly known as: CYANOCOBALAMIN Take 500 mcg by mouth daily.   Vitamin D 50 MCG (2000 UT) tablet Take 2,000 Units by mouth daily.        DISCHARGE INSTRUCTIONS:   1. PCP f/u in 1-2 weeks 2. Palliative care follow up 3. Cardiology f/u in 3 weeks 4. Speech therapy  DIET:   Cardiac diet Pt needs assistance w/ ordering her meals - add Gravy to cut meats, potatoes. Pt would like at Lunch meals: chicken tenders(cut), banana pudding, and a baked potato w/ butter/sour cream, condiments (ok per speech)  ACTIVITY:   Activity as tolerated  OXYGEN:   Home Oxygen: No.  Oxygen Delivery: room air  DISCHARGE LOCATION:   nursing home   If you experience worsening of your admission symptoms, develop shortness of breath, life threatening emergency, suicidal or homicidal thoughts you must seek medical attention immediately by calling 911 or calling your MD immediately  if symptoms less severe.  You Must read complete instructions/literature along with all the possible adverse reactions/side effects for all the Medicines you take and that have been prescribed to you. Take any new Medicines after you have completely understood and accpet all the possible adverse reactions/side effects.   Please note  You were cared for by a  hospitalist during your hospital stay. If you have any questions about your discharge medications or the care you received while you were in the hospital after you are discharged, you can call the unit and asked to speak with the hospitalist on call if the hospitalist that took care of you is not available. Once you are discharged, your primary care physician will handle any further medical issues. Please note that NO REFILLS for any discharge medications will be authorized once you are discharged, as it is imperative that you return to your primary care physician (or establish a relationship with a primary care physician if you do not have one) for your aftercare needs so that they can reassess your need for medications and monitor your lab values.    On the day of Discharge:  VITAL SIGNS:   Blood pressure (!) 164/80, pulse 71, temperature 98.4 F (36.9 C), resp. rate 18, height 5' (1.524 m), weight 59 kg, SpO2 100 %.  PHYSICAL EXAMINATION:  GENERAL:  68 y.o.-year-old elderly patient lying in the bed with no acute distress.  EYES: Pupils equal, round, reactive to light and accommodation. No scleral icterus. Extraocular muscles intact.  HEENT: Head atraumatic, normocephalic. Oropharynx and nasopharynx clear.  NECK:  Supple, no jugular venous distention. No thyroid enlargement, no tenderness.  LUNGS: Normal breath sounds bilaterally, no wheezing, rales,rhonchi or crepitation. No use of accessory muscles of respiration.  Decreased bibasilar breath sounds CARDIOVASCULAR: S1, S2 normal. No  rubs, or gallops.  3/6 systolic murmur present ABDOMEN: Soft, nontender, nondistended. Bowel sounds present. No organomegaly or mass.  EXTREMITIES: No pedal edema, cyanosis, or clubbing.  NEUROLOGIC: Cranial nerves II through XII are intact.  Able to move right upper and lower extremity against gravity, however only moving left upper and lower extremity to commands slightly.  However has 2+ reflexes equal  in both extremities.   Sensation intact. Gait not checked.  PSYCHIATRIC: The patient is somnolent, arousable and and oriented to self SKIN: No obvious rash, lesion, or ulcer.   DATA REVIEW:   CBC Recent Labs  Lab 03/11/19 0457  WBC 7.6  HGB 13.3  HCT 40.1  PLT 261    Chemistries  Recent Labs  Lab 03/12/19 0458  NA 138  K 3.8  CL 106  CO2 25  GLUCOSE 95  BUN 15  CREATININE 0.82  CALCIUM 8.8*     Microbiology Results  Results for orders placed or performed during the hospital encounter of 03/10/19  Urine culture     Status: Abnormal   Collection Time: 03/10/19  9:41 PM   Specimen: Urine, Catheterized  Result Value Ref Range Status   Specimen Description   Final    URINE, CATHETERIZED Performed at [facility], [address] [place], [place] [address]    Special Requests   Final    Normal Performed at [facility], [address] [place], [place] [address]    Culture 80,000 COLONIES/mL PROTEUS MIRABILIS (A)  Final   Report Status 03/13/2019 FINAL  Final   Organism ID, Bacteria PROTEUS MIRABILIS (A)  Final      Susceptibility   Proteus mirabilis - MIC*    AMPICILLIN <=2 SENSITIVE Sensitive     CEFAZOLIN <=4 SENSITIVE Sensitive     CEFTRIAXONE <=1 SENSITIVE Sensitive     CIPROFLOXACIN <=0.25 SENSITIVE Sensitive     GENTAMICIN <=1 SENSITIVE Sensitive     IMIPENEM 4 SENSITIVE Sensitive     NITROFURANTOIN 128 RESISTANT Resistant     TRIMETH/SULFA <=20 SENSITIVE Sensitive     AMPICILLIN/SULBACTAM <=2 SENSITIVE Sensitive     PIP/TAZO <=4 SENSITIVE Sensitive     * 80,000 COLONIES/mL PROTEUS MIRABILIS  SARS Coronavirus 2 (CEPHEID - Performed in [facility] hospital lab), Hosp Order     Status: None   Collection Time: 03/11/19  2:05 AM   Specimen: Nasopharyngeal Swab  Result Value Ref Range Status   SARS Coronavirus 2 NEGATIVE NEGATIVE Final    Comment: (NOTE) If result is NEGATIVE SARS-CoV-2 target nucleic acids are NOT DETECTED. The  SARS-CoV-2 RNA is generally detectable in upper and lower  respiratory specimens during the acute phase of infection. The lowest  concentration of SARS-CoV-2 viral copies this assay can detect is 250  copies / mL. A negative result does not preclude SARS-CoV-2 infection  and should not be used as the sole basis for treatment or other  patient management decisions.  A negative result may occur with  improper specimen collection / handling, submission  of specimen other  than nasopharyngeal swab, presence of viral mutation(s) within the  areas targeted by this assay, and inadequate number of viral copies  (<250 copies / mL). A negative result must be combined with clinical  observations, patient history, and epidemiological information. If result is POSITIVE SARS-CoV-2 target nucleic acids are DETECTED. The SARS-CoV-2 RNA is generally detectable in upper and lower  respiratory specimens dur ing the acute phase of infection.  Positive  results are indicative of active infection with SARS-CoV-2.  Clinical  correlation with patient history and other diagnostic information is  necessary to determine patient infection status.  Positive results do  not rule out bacterial infection or co-infection with other viruses. If result is PRESUMPTIVE POSTIVE SARS-CoV-2 nucleic acids MAY BE PRESENT.   A presumptive positive result was obtained on the submitted specimen  and confirmed on repeat testing.  While 2019 novel coronavirus  (SARS-CoV-2) nucleic acids may be present in the submitted sample  additional confirmatory testing may be necessary for epidemiological  and / or clinical management purposes  to differentiate between  SARS-CoV-2 and other Sarbecovirus currently known to infect humans.  If clinically indicated additional testing with an alternate test  methodology [phone] is advised. The SARS-CoV-2 RNA is generally  detectable in upper and lower respiratory sp ecimens during the acute    phase of infection. The expected result is Negative. Fact Sheet for Patients:  BoilerBrush.com.cyhttps://www.fda.gov/media/136312/download Fact Sheet for Healthcare Providers: https://pope.com/https://www.fda.gov/media/136313/download This test is not yet approved or cleared by the [place] FDA and has been authorized for detection and/or diagnosis of SARS-CoV-2 by FDA under an Emergency Use Authorization (EUA).  This EUA will remain in effect (meaning this test can be used) for the duration of the COVID-19 declaration under Section 564(b)(1) of the Act, 21 U.S.C. section 360bbb-3(b)(1), unless the authorization is terminated or revoked sooner. Performed at [facility], [address] [place], [place] [address]     RADIOLOGY:  No results found.   Management plans discussed with the patient, family and they are in agreement.  CODE STATUS:     Code Status Orders  (From admission, onward)         Start     Ordered   03/11/19 0242  Full code  Continuous     03/11/19 0241        Code Status History    Date Active Date Inactive Code Status Order ID Comments User Context   03/01/2017 0415 03/01/2017 2038 Full Code [id number]  Arnaldo NatalDiamond, Michael S, MD Inpatient   01/13/2017 0238 01/14/2017 1828 Full Code [id number]  Arnaldo Nataliamond, Michael S, MD ED   04/28/2016 1812 04/29/2016 1451 Full Code [id number]  Eugenie NorrieBlakeney, Dana G, NP ED   Advance Care Planning Activity      TOTAL TIME TAKING CARE OF THIS PATIENT: 38 minutes.    Enid Baasadhika Danyella Mcginty M.D on 03/14/2019 at 1:03 PM  Between 7am to 6pm - Pager - [phone]  After 6pm go to [email or web address] - Scientist, research (life sciences)password [email or web address]  [facility] [place] [facility]  Office  [phone]  CC: Primary care physician; Alba CorySowles, Krichna, MD   Note: This dictation was prepared with Dragon dictation along with smaller phrase technology. Any transcriptional errors that result from this process are unintentional.

## 2019-03-14 NOTE — Progress Notes (Signed)
Nutrition Follow Up Note   DOCUMENTATION CODES:   Not applicable  INTERVENTION:   Boost Breeze po TID, each supplement provides 250 kcal and 9 grams of protein  MVI daily   Dysphagia 3 diet   Vitamin C 250mg  po BID  NUTRITION DIAGNOSIS:   Predicted suboptimal nutrient intake related to chronic illness(dementia) as evidenced by other (comment)(per chart review).  GOAL:   Patient will meet greater than or equal to 90% of their needs -progressing   MONITOR:   PO intake, Supplement acceptance, Labs, Weight trends, Skin, I & O's  ASSESSMENT:   68 y/o female with h/o dementia, substance abuse, CHF, CKD, HTN, depression admitted with weakness and fall. Pt found to have stroke  RD working remotely.  Pt with fair appetite and oral intake in hospital; pt eating 50-80% of meals but refusing most of the Ensure. RD will discontinue Ensure and add Boost Breeze. No new weight since admit; RD will request weekly weights. Pt waiting on insurance authorization for discharge.     Medications reviewed and include: aspirin, plavix, lovenox, MVI, Vit C  Labs reviewed: K 3.8 wnl  Diet Order:   Diet Order            DIET DYS 3 Room service appropriate? Yes with Assist; Fluid consistency: Thin  Diet effective now             EDUCATION NEEDS:   Not appropriate for education at this time  Skin:  Skin Assessment: Reviewed RN Assessment  Last BM:  6/27  Height:   Ht Readings from Last 1 Encounters:  03/10/19 5' (1.524 m)    Weight:   Wt Readings from Last 1 Encounters:  03/10/19 59 kg    Ideal Body Weight:  45.4 kg  BMI:  Body mass index is 25.39 kg/m.  Estimated Nutritional Needs:   Kcal:  1400-1600kcal/day  Protein:  70-80g/day  Fluid:  >1.2L/day  Koleen Distance MS, RD, LDN Pager #- [phone] Office#- [phone] After Hours Pager: [phone]

## 2019-03-14 NOTE — Progress Notes (Signed)
PT Cancellation Note  Patient Details Name: Erica Stevens MRN: [id number] DOB: 03-18-1951   Cancelled Treatment:    Reason Eval/Treat Not Completed: Other (comment)(Patient in bed upon PT arrival, Repeatedly stated she "wants a cigarette, needs a cigarette or I'm going to go bonkers" with increasing volume each repetition. Due to patient agitation therapy session will be attempted at another time/date.)  Janna Arch, PT, DPT   03/14/2019, 3:37 PM

## 2019-03-14 NOTE — TOC Transition Note (Signed)
Transition of Care [facility]) - CM/SW Discharge Note   Patient Details  Name: Erica Stevens MRN: [id number] Date of Birth: 1951-03-05  Transition of Care [facility] CM/SW Contact:  Dalena Plantz, Lenice Llamas Phone Number: [phone]  03/14/2019, 2:16 PM   Clinical Narrative: Per Angelica Chessman administrator at [facility] SNF authorization has been received and patient can come today. RN will call report and arrange EMS for transport. Clinical Education officer, museum (CSW) sent D/C orders to [employer] [facility] via [place]. Patient is aware of above. CSW contacted patient's daughter [name] and made her aware of above. MD ordered outpatient palliative to follow. [address] liaison is aware of above. Please reconsult if future social work needs arise. CSW signing off.      Final next level of care: Skilled Nursing Facility Barriers to Discharge: Barriers Resolved   Patient Goals and CMS Choice Patient states their goals for this hospitalization and ongoing recovery are:: To feel better CMS Medicare.gov Compare Post Acute Care list provided to:: Patient Represenative (must comment) Choice offered to / list presented to : Adult Children  Discharge Placement   Existing PASRR number confirmed : 03/11/19          Patient [facility] bed at: [facility] Patient to be transferred to facility by: [facility] Name of family member notified: Patient's daughter [name] is aware of D/C today. Patient and family notified of of transfer: 03/14/19  Discharge Plan and Services   Discharge Planning Services: CM Consult                                 Social Determinants of Health ([place]) Interventions     Readmission Risk Interventions Readmission Risk Prevention Plan 03/11/2019  Transportation Screening Complete  PCP or Specialist Appt within 5-7 Days Complete  Home Care Screening Complete  Medication Review (RN CM) Complete  Some recent data might be hidden

## 2019-03-14 NOTE — Progress Notes (Signed)
New referral for outpatient Palliative to follow at [facility] received from [place]. Plan is for discharge today. Patient information faxed to referral. Flo Shanks BSN, RN, [place] [phone]

## 2019-03-14 NOTE — Progress Notes (Signed)
Discharge report called to [facility]/ iv and tele removed/ EMS called to transport

## 2019-03-15 ENCOUNTER — Encounter: Payer: Self-pay | Admitting: Nurse Practitioner

## 2019-03-15 ENCOUNTER — Non-Acute Institutional Stay: Payer: Medicare HMO | Admitting: Nurse Practitioner

## 2019-03-15 VITALS — BP 130/67 | HR 73 | Temp 98.3°F | Resp 18 | Ht 60.0 in | Wt 118.0 lb

## 2019-03-15 DIAGNOSIS — Z515 Encounter for palliative care: Secondary | ICD-10-CM

## 2019-03-15 DIAGNOSIS — R531 Weakness: Secondary | ICD-10-CM | POA: Insufficient documentation

## 2019-03-15 NOTE — Progress Notes (Signed)
[place] Consult Note Telephone: [phone]  Fax: [phone]  PATIENT NAME: Erica Stevens DOB: 02/09/1951 MRN: [id number]  PRIMARY CARE PROVIDER:   Steele Sizer, MD  REFERRING PROVIDER:  Dr Hodges/[facility] RESPONSIBLE PARTY:   Tami Ribas daughter [phone], son Kimyetta Flott [phone]  I was asked to see Erica Stevens by Dr Nyra Capes for [place] and PLAN:  1. Palliative care encounter Z51.5; Palliative medicine team will continue to support patient, patient's family, and medical team. Visit consisted of counseling and education dealing with the complex and emotionally intense issues of symptom management and palliative care in the setting of serious and potentially life-threatening illness  2. Generalized weakness R53.1  secondary to continue with therapy as able. Encourage energy conservation and rest times.  ASSESSMENT:     7 / 2 / 2020 weight 118.0 pounds  I visited and observe Erica Stevens. Explain purpose of palliative care visit and she was in agreement that appears cognitively mildly impaired. She is slow to speak, process questions. She denied the symptoms of pain or shortness of breath. We talked about her appetite what she shared is okay. Limited verbal discussion due to cognitive impairment. She was cooperative with assessment. Currently she does appear stable. She is attempting to work with therapy. I have attempted to contact her daughter for further discussion of goals of care as she currently is a full code. I updated staff no changes at present time until further discussion with daughter.  I spent 60 minutes providing this consultation,  from 10:15 am to 11:15am. More than 50% of the time in this consultation was spent coordinating communication.   HISTORY OF PRESENT ILLNESS:  Erica Stevens is a 68 y.o. year old female with multiple medical problems including Coronary artery  disease s/p cabg, chronic systolic congestive heart failure, stroke, hyperlipidemia, dementia, allergies, anxiety, chronic kidney disease, chronic pain syndrome, degeneration of cervical intervertebral disc, hypertension, history of migraine, mitral valve disorder, osteoporosis, hypercholesterolemia, reflux esophagitis, thoracic lumbar neuritis, history of urinary incontinence, depression. Hospitalized 6 / 36 / 2020 to 7 / 2 / 2020 with Falls in left-sided weakness with Falls and left-sided weakness with work up significant for acute stroke per MRI with small early acute sub-acute infarct in right basal ganglia and left Corona radiata, chronic small vessel ischemic changes noted with left-sided weakness. Prada dopplers significant stenosis with t t e with negative bubble study. Given multiple infarcts recommended TEE but she refused. Neurology consulted and started on Plavix, Statin, aspirin. Dementia she is pleasantly confused on Aricept. She was discharged to Short-Term Rehab at [facility] where she currently resides. She does require assistance with transferring, mobility, adl's. She does have left-sided weakness. She does require assistance with tray setup but attempts to feed herself. At present she is lying in bed. She appears comfortable. No visitors present. Palliative Care was asked to help address goals of care.   CODE STATUS: full code  PPS: 30% HOSPICE ELIGIBILITY/DIAGNOSIS: TBD  PAST MEDICAL HISTORY:  Past Medical History:  Diagnosis Date  . Allergic rhinitis due to pollen   . Anxiety   . Brachial neuritis or radiculitis NOS   . Cellulitis and abscess of unspecified site   . Chronic kidney disease, stage I   . Chronic pain syndrome   . Congestive heart failure, unspecified   . Coronary atherosclerosis of unspecified type of vessel, native or graft   . Degeneration of cervical  intervertebral disc   . Depression   . Hypertension   . Migraine, unspecified, without  mention of intractable migraine without mention of status migrainosus   . 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 Osteoporosis, unspecified   . Pure hypercholesterolemia   . Reflux esophagitis   . Thoracic or lumbosacral neuritis or radiculitis, unspecified   . Unspecified urinary incontinence     SOCIAL HX:  Social History   Tobacco Use  . Smoking status: Current Every Day Smoker    Packs/day: 1.50    Types: Cigarettes  . Smokeless tobacco: Never Used  Substance Use Topics  . Alcohol use: No    ALLERGIES:  Allergies  Allergen Reactions  . Erythromycin     Other reaction(s): Unknown  . Ivp Dye [Iodinated Diagnostic Agents]   . Sulfa Antibiotics     Stated turns orange  . Sulfur      PERTINENT MEDICATIONS:  Outpatient Encounter Medications as of 03/15/2019  Medication Sig  . amLODipine (NORVASC) 10 MG tablet Take 1 tablet (10 mg total) by mouth daily.  Marland Kitchen. aspirin 81 MG tablet Take 1 tablet (81 mg total) by mouth daily.  Marland Kitchen. atorvastatin (LIPITOR) 80 MG tablet Take 1 tablet (80 mg total) by mouth daily.  . Cholecalciferol (VITAMIN D) 50 MCG (2000 UT) tablet Take 2,000 Units by mouth daily.  . clopidogrel (PLAVIX) 75 MG tablet Take 1 tablet (75 mg total) by mouth daily.  Marland Kitchen. donepezil (ARICEPT) 5 MG tablet Take 5 mg by mouth daily.  . enalapril (VASOTEC) 20 MG tablet Take 1 tablet (20 mg total) by mouth daily.  . metoprolol succinate (TOPROL-XL) 25 MG 24 hr tablet TAKE ONE (1) TABLET EACH DAY (Patient taking differently: Take 25 mg by mouth daily. )  . vitamin B-12 (CYANOCOBALAMIN) 500 MCG tablet Take 500 mcg by mouth daily.  . [DISCONTINUED] nicotine (NICODERM CQ - DOSED IN MG/24 HOURS) 21 mg/24hr patch Place 1 patch (21 mg total) onto the skin daily. (Patient not taking: Reported on 03/15/2019)   No facility-administered encounter medications on file as of 03/15/2019.     PHYSICAL EXAM:   General: NAD, debilitated female Cardiovascular: regular rate and rhythm Pulmonary: clear ant  fields Abdomen: soft, nontender, + bowel sounds GU: no suprapubic tenderness Extremities: no edema, no joint deformities Skin: no rashes Neurological: Weakness but otherwise nonfocal/left sided weakness  Macel Yearsley Prince RomeZ Erica Niehoff, NP

## 2019-03-18 ENCOUNTER — Other Ambulatory Visit: Payer: Self-pay

## 2019-03-20 DIAGNOSIS — R262 Difficulty in walking, not elsewhere classified: Secondary | ICD-10-CM | POA: Diagnosis not present

## 2019-03-20 DIAGNOSIS — I69354 Hemiplegia and hemiparesis following cerebral infarction affecting left non-dominant side: Secondary | ICD-10-CM | POA: Diagnosis not present

## 2019-03-20 DIAGNOSIS — Z8673 Personal history of transient ischemic attack (TIA), and cerebral infarction without residual deficits: Secondary | ICD-10-CM | POA: Diagnosis not present

## 2019-03-20 DIAGNOSIS — R5381 Other malaise: Secondary | ICD-10-CM | POA: Diagnosis not present

## 2019-03-26 DIAGNOSIS — Z8673 Personal history of transient ischemic attack (TIA), and cerebral infarction without residual deficits: Secondary | ICD-10-CM | POA: Diagnosis not present

## 2019-03-26 DIAGNOSIS — R262 Difficulty in walking, not elsewhere classified: Secondary | ICD-10-CM | POA: Diagnosis not present

## 2019-03-26 DIAGNOSIS — R5381 Other malaise: Secondary | ICD-10-CM | POA: Diagnosis not present

## 2019-03-26 DIAGNOSIS — I69354 Hemiplegia and hemiparesis following cerebral infarction affecting left non-dominant side: Secondary | ICD-10-CM | POA: Diagnosis not present

## 2019-04-29 DIAGNOSIS — F0390 Unspecified dementia without behavioral disturbance: Secondary | ICD-10-CM | POA: Diagnosis not present

## 2019-04-29 DIAGNOSIS — I69354 Hemiplegia and hemiparesis following cerebral infarction affecting left non-dominant side: Secondary | ICD-10-CM | POA: Diagnosis not present

## 2019-05-22 ENCOUNTER — Non-Acute Institutional Stay: Payer: Medicare HMO | Admitting: Nurse Practitioner

## 2019-05-22 ENCOUNTER — Encounter: Payer: Self-pay | Admitting: Nurse Practitioner

## 2019-05-22 VITALS — BP 118/56 | HR 60 | Temp 97.8°F | Resp 18 | Wt 116.0 lb

## 2019-05-22 DIAGNOSIS — Z515 Encounter for palliative care: Secondary | ICD-10-CM

## 2019-05-22 NOTE — Progress Notes (Signed)
[facility] Consult Note Telephone: [phone]  Fax: [phone]  PATIENT NAME: Erica LaundrySusan Gray Dockham DOB: [birth year] MRN: [id number]  PRIMARY CARE PROVIDER:   Alba CorySowles, Krichna, MD  REFERRING PROVIDER: Dr Hodges/[facility] RESPONSIBLE PARTY:   Trudee GripCarrie Coombs daughter [phone], son Stacey DrainCraig Allebach [phone]  RECOMMENDATIONS and PLAN:  1. ACP: Full code, goc discussion with daughter call pending  2. Generalized weaknessR53.1  secondary to Stroke/CHF/CAD continue with therapy as able. Encourage energy conservation and rest times.  3. Palliative care encounter Z51.5; Palliative medicine team will continue to support patient, patient's family, and medical team. Visit consisted of counseling and education dealing with the complex and emotionally intense issues of symptom management and palliative care in the setting of serious and potentially life-threatening illness  I spent 45 minutes providing this consultation,  from 10:00am to 10:45am More than 50% of the time in this consultation was spent coordinating communication.   HISTORY OF PRESENT ILLNESS:  Erica Stevens is a 68 y.o. year old female with multiple medical problems including  Coronary artery disease s/p cabg, chronic systolic congestive heart failure, stroke, hyperlipidemia, dementia, allergies, anxiety, chronic kidney disease, chronic pain syndrome, degeneration of cervical intervertebral disc, hypertension, history of migraine, mitral valve disorder, osteoporosis, hypercholesterolemia, reflux esophagitis, thoracic lumbar neuritis, history of urinary incontinence, depression. [name] continues to reside at [facility]. She did have a fall on 8/28 / 2020 slid down while sitting in the wheelchair with no noted injury. Alert and oriented, one person assist with ADLs and transfers. She is incontinent of bowel  and bladder. She does feed herself with a regular diet, regular texture, regular liquid consistency. At present she is lying in bed, appears comfortable, debilitated. No visitors present. I visited and observed [name]. We talked about purpose for palliative care visit. We talked about how she is feeling today. She verbalize that she's doing fine she is no complaints or concerns. She denied symptoms of pain or shortness of breath. We talked about her appetite. We talked about mobility and being out of bed. Ms Claudette LawsWatson endorses she continues to work with therapy and is hoping to become more functional. Talked about role of palliative care and plan of care. She does continue to appear stable at present time. I will continue to follow monitor with palliative with next visit in 4 weeks. I have attempted to contact her daughter, Erica Stevens for further discussion of goals of care, code status. I updated nursing staff in the new changes to current goals or plan of care at present time.  8 / 28 / 2020 covid-19 negative  Palliative Care was asked to help address goals of care.  7 / 2 / 2020 weight 118.0 lbs 9 / 4 / 2020 weight 116 lbs  CODE STATUS: Full code  PPS: 40% HOSPICE ELIGIBILITY/DIAGNOSIS: TBD  PAST MEDICAL HISTORY:  Past Medical History:  Diagnosis Date   Allergic rhinitis due to pollen    Anxiety    Brachial neuritis or radiculitis NOS    Cellulitis and abscess of unspecified site    Chronic kidney disease, stage I    Chronic pain syndrome    Congestive heart failure, unspecified    Coronary atherosclerosis of unspecified type of vessel, native or graft    Degeneration of cervical intervertebral disc    Depression    Hypertension    Migraine, unspecified, without mention of intractable migraine without mention  of status migrainosus    Mitral valve disorders(424.0)    Osteoporosis, unspecified    Pure hypercholesterolemia    Reflux esophagitis    Thoracic or lumbosacral  neuritis or radiculitis, unspecified    Unspecified urinary incontinence     SOCIAL HX:  Social History   Tobacco Use   Smoking status: Current Every Day Smoker    Packs/day: 1.50    Types: Cigarettes   Smokeless tobacco: Never Used  Substance Use Topics   Alcohol use: No    ALLERGIES:  Allergies  Allergen Reactions   Erythromycin     Other reaction(s): Unknown   Ivp Dye [Iodinated Diagnostic Agents]    Sulfa Antibiotics     Stated turns orange   Sulfur      PERTINENT MEDICATIONS:  Outpatient Encounter Medications as of 05/22/2019  Medication Sig   amLODipine (NORVASC) 10 MG tablet Take 1 tablet (10 mg total) by mouth daily.   aspirin 81 MG tablet Take 1 tablet (81 mg total) by mouth daily.   atorvastatin (LIPITOR) 80 MG tablet Take 1 tablet (80 mg total) by mouth daily.   Cholecalciferol (VITAMIN D) 50 MCG (2000 UT) tablet Take 2,000 Units by mouth daily.   clopidogrel (PLAVIX) 75 MG tablet Take 1 tablet (75 mg total) by mouth daily.   donepezil (ARICEPT) 5 MG tablet Take 5 mg by mouth daily.   enalapril (VASOTEC) 20 MG tablet Take 1 tablet (20 mg total) by mouth daily.   metoprolol succinate (TOPROL-XL) 25 MG 24 hr tablet TAKE ONE (1) TABLET EACH DAY (Patient taking differently: Take 25 mg by mouth daily. )   vitamin B-12 (CYANOCOBALAMIN) 500 MCG tablet Take 500 mcg by mouth daily.   No facility-administered encounter medications on file as of 05/22/2019.     PHYSICAL EXAM:   General: NAD, pleasant, debilitated female Cardiovascular: regular rate and rhythm Pulmonary: clear ant fields Abdomen: soft, nontender, + bowel sounds Extremities: no edema, no joint deformities Neurological: Weakness but otherwise nonfocal  Dagny Fiorentino Ihor Gully, NP

## 2019-05-23 ENCOUNTER — Other Ambulatory Visit: Payer: Self-pay

## 2019-05-30 DIAGNOSIS — F0390 Unspecified dementia without behavioral disturbance: Secondary | ICD-10-CM | POA: Diagnosis not present

## 2019-06-11 ENCOUNTER — Encounter: Payer: Self-pay | Admitting: Family Medicine

## 2019-06-25 DIAGNOSIS — R41841 Cognitive communication deficit: Secondary | ICD-10-CM | POA: Diagnosis not present

## 2019-06-25 DIAGNOSIS — F419 Anxiety disorder, unspecified: Secondary | ICD-10-CM | POA: Diagnosis not present

## 2019-06-25 DIAGNOSIS — M6281 Muscle weakness (generalized): Secondary | ICD-10-CM | POA: Diagnosis not present

## 2019-06-25 DIAGNOSIS — I6350 Cerebral infarction due to unspecified occlusion or stenosis of unspecified cerebral artery: Secondary | ICD-10-CM | POA: Diagnosis not present

## 2019-06-25 DIAGNOSIS — U071 COVID-19: Secondary | ICD-10-CM | POA: Diagnosis not present

## 2019-06-25 DIAGNOSIS — R2689 Other abnormalities of gait and mobility: Secondary | ICD-10-CM | POA: Diagnosis not present

## 2019-06-25 DIAGNOSIS — F0391 Unspecified dementia with behavioral disturbance: Secondary | ICD-10-CM | POA: Diagnosis not present

## 2019-06-25 DIAGNOSIS — I5022 Chronic systolic (congestive) heart failure: Secondary | ICD-10-CM | POA: Diagnosis not present

## 2019-06-25 DIAGNOSIS — I2510 Atherosclerotic heart disease of native coronary artery without angina pectoris: Secondary | ICD-10-CM | POA: Diagnosis not present

## 2019-07-17 DIAGNOSIS — F0390 Unspecified dementia without behavioral disturbance: Secondary | ICD-10-CM | POA: Diagnosis not present

## 2019-07-17 DIAGNOSIS — I5020 Unspecified systolic (congestive) heart failure: Secondary | ICD-10-CM | POA: Diagnosis not present

## 2019-07-17 DIAGNOSIS — I69354 Hemiplegia and hemiparesis following cerebral infarction affecting left non-dominant side: Secondary | ICD-10-CM | POA: Diagnosis not present

## 2019-07-31 ENCOUNTER — Non-Acute Institutional Stay: Payer: Medicare HMO | Admitting: Nurse Practitioner

## 2019-07-31 ENCOUNTER — Encounter: Payer: Self-pay | Admitting: Nurse Practitioner

## 2019-07-31 VITALS — BP 99/62 | HR 68 | Temp 97.4°F | Resp 18 | Wt 119.0 lb

## 2019-07-31 DIAGNOSIS — Z515 Encounter for palliative care: Secondary | ICD-10-CM | POA: Diagnosis not present

## 2019-07-31 DIAGNOSIS — I639 Cerebral infarction, unspecified: Secondary | ICD-10-CM | POA: Diagnosis not present

## 2019-07-31 NOTE — Progress Notes (Signed)
Therapist, nutritional Palliative Care Consult Note Telephone: [phone]  Fax: [phone]  PATIENT NAME: Erica Stevens DOB: 06/12/1951 MRN: [id number]  PRIMARY CARE PROVIDER:   Dr Hodges/[facility] RESPONSIBLE PARTY:   [name] daughter [phone], son Thaila Bottoms [phone]  RECOMMENDATIONS and PLAN: 1.ACP: Full code, goc discussion with daughter call pending  2.Generalized weakness1 secondary to Stroke/CHF/CAD continue with therapy as able. Encourage energy conservation and rest times.  3. Palliative care encounter; Palliative medicine team will continue to support patient, patient's family, and medical team. Visit consisted of counseling and education dealing with the complex and emotionally intense issues of symptom management and palliative care in the setting of serious and potentially life-threatening illness  I spent 35 minutes providing this consultation,  from 1:00pm to 1:35pm. More than 50% of the time in this consultation was spent coordinating communication.   HISTORY OF PRESENT ILLNESS:  Erica Stevens is a 67 y.o. year old female with multiple medical problems including Coronary artery disease s/p cabg, chronic systolic congestive heart failure, stroke, hyperlipidemia, dementia, allergies, anxiety, chronic kidney disease, chronic pain syndrome, degeneration of cervical intervertebral disc, hypertension, history of migraine, mitral valve disorder, osteoporosis, hypercholesterolemia, reflux esophagitis, thoracic lumbar neuritis, history of urinary incontinence, depression.  Erica Stevens continues to reside in [facility] She is bed down requires assistance for transferring, adl's. She does feed herself with a poor appetite despite slight weight gain. She refuses to drink Med pass. Stop does attempt to feed her but she shakes her head no. Dietitian following.  She was covid positive and has completed her isolation time, currently asymptomatic. At present she is lying in bed. She appears to debilitated but comfortable. No visitors present. I visited an observed Erica Stevens. We talked about purpose of palliative care visit and she nodded her head yes. Erica. Stevens was not verbal but did not her head to answers to questions. Erica. Stevens knodded know when asked if she was having symptoms of pain or shortness of breath. I asked if she was hungry and she nodded know. She was cooperative with assessment. Time spent providing emotional support. No new changes to current goals or plan of care. I updated staff  Palliative Care was asked to help to continue to address goals of care.   CODE STATUS: Full code   PPS: 30% HOSPICE ELIGIBILITY/DIAGNOSIS: TBD  PAST MEDICAL HISTORY:  Past Medical History:  Diagnosis Date  . Allergic rhinitis due to pollen   . Anxiety   . Brachial neuritis or radiculitis NOS   . Cellulitis and abscess of unspecified site   . Chronic kidney disease, stage I   . Chronic pain syndrome   . Congestive heart failure, unspecified   . Coronary atherosclerosis of unspecified type of vessel, native or graft   . Degeneration of cervical intervertebral disc   . Depression   . Hypertension   . Migraine, unspecified, without mention of intractable migraine without mention of status migrainosus   . 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 Osteoporosis, unspecified   . Pure hypercholesterolemia   . Reflux esophagitis   . Thoracic or lumbosacral neuritis or radiculitis, unspecified   . Unspecified urinary incontinence     SOCIAL HX:  Social History   Tobacco Use  . Smoking status: Current Every Day Smoker    Packs/day: 1.50    Types: Cigarettes  . Smokeless tobacco: Never Used  Substance Use Topics  .  Alcohol use: No    ALLERGIES:  Allergies  Allergen Reactions  . Erythromycin     Other reaction(s): Unknown  . Ivp Dye [Iodinated Diagnostic  Agents]   . Sulfa Antibiotics     Stated turns orange  . Sulfur      PERTINENT MEDICATIONS:  Outpatient Encounter Medications as of 07/31/2019  Medication Sig  . amLODipine (NORVASC) 10 MG tablet Take 1 tablet (10 mg total) by mouth daily.  Marland Kitchen aspirin 81 MG tablet Take 1 tablet (81 mg total) by mouth daily.  Marland Kitchen atorvastatin (LIPITOR) 80 MG tablet Take 1 tablet (80 mg total) by mouth daily.  . Cholecalciferol (VITAMIN D) 50 MCG (2000 UT) tablet Take 2,000 Units by mouth daily.  . clopidogrel (PLAVIX) 75 MG tablet Take 1 tablet (75 mg total) by mouth daily.  Marland Kitchen donepezil (ARICEPT) 5 MG tablet Take 5 mg by mouth daily.  . enalapril (VASOTEC) 20 MG tablet Take 1 tablet (20 mg total) by mouth daily.  . metoprolol succinate (TOPROL-XL) 25 MG 24 hr tablet TAKE ONE (1) TABLET EACH DAY (Patient taking differently: Take 25 mg by mouth daily. )  . vitamin B-12 (CYANOCOBALAMIN) 500 MCG tablet Take 500 mcg by mouth daily.   No facility-administered encounter medications on file as of 07/31/2019.     PHYSICAL EXAM:   General: NAD, debilitated, chronically ill female Cardiovascular: regular rate and rhythm Pulmonary: clear ant fields Abdomen: soft, nontender, + bowel sounds Extremities: no edema, no joint deformities Neurological: functionally quadriplegic  Kreed Kauffman Ihor Gully, NP

## 2019-08-01 ENCOUNTER — Other Ambulatory Visit: Payer: Self-pay

## 2019-08-20 DIAGNOSIS — F0390 Unspecified dementia without behavioral disturbance: Secondary | ICD-10-CM | POA: Diagnosis not present

## 2019-08-20 DIAGNOSIS — I69354 Hemiplegia and hemiparesis following cerebral infarction affecting left non-dominant side: Secondary | ICD-10-CM | POA: Diagnosis not present

## 2019-08-20 DIAGNOSIS — I5020 Unspecified systolic (congestive) heart failure: Secondary | ICD-10-CM | POA: Diagnosis not present

## 2019-10-16 DIAGNOSIS — I69354 Hemiplegia and hemiparesis following cerebral infarction affecting left non-dominant side: Secondary | ICD-10-CM | POA: Diagnosis not present

## 2019-10-16 DIAGNOSIS — I5020 Unspecified systolic (congestive) heart failure: Secondary | ICD-10-CM | POA: Diagnosis not present

## 2019-10-16 DIAGNOSIS — F0390 Unspecified dementia without behavioral disturbance: Secondary | ICD-10-CM | POA: Diagnosis not present

## 2019-10-25 ENCOUNTER — Non-Acute Institutional Stay: Payer: Medicare HMO | Admitting: Nurse Practitioner

## 2019-10-25 ENCOUNTER — Encounter: Payer: Self-pay | Admitting: Nurse Practitioner

## 2019-10-25 VITALS — BP 112/65 | HR 72 | Temp 98.0°F | Resp 18 | Wt 113.9 lb

## 2019-10-25 DIAGNOSIS — I639 Cerebral infarction, unspecified: Secondary | ICD-10-CM | POA: Diagnosis not present

## 2019-10-25 DIAGNOSIS — Z515 Encounter for palliative care: Secondary | ICD-10-CM

## 2019-10-25 NOTE — Progress Notes (Signed)
[place] Consult Note Telephone: [phone]  Fax: [phone]  PATIENT NAME: Erica Stevens DOB: 03/29/1951 MRN: [id number]  PRIMARY CARE PROVIDER:   Dr [facility] RESPONSIBLE PARTY:[name] daughter [phone], son Breanne Olvera [phone]  RECOMMENDATIONS and PLAN: 1.ACP: Full code, goc discussion with daughter call pending  2.Generalized weaknesssecondary toStroke/CHF/CADcontinue with therapy as able. Encourage energy conservation and rest times.  3.Palliative care encounter; Palliative medicine team will continue to support patient, patient's family, and medical team. Visit consisted of counseling and education dealing with the complex and emotionally intense issues of symptom management and palliative care in the setting of serious and potentially life-threatening illness  I spent 45 minutes providing this consultation,  from 1:30pm to 2:15pm. More than 50% of the time in this consultation was spent coordinating communication.   HISTORY OF PRESENT ILLNESS:  Erica Stevens is a 69 y.o. year old female with multiple medical problems including Coronary artery disease s/p cabg, chronic systolic congestive heart failure, stroke, hyperlipidemia, dementia, allergies, anxiety, chronic kidney disease, chronic pain syndrome, degeneration of cervical intervertebral disc, hypertension, history of migraine, mitral valve disorder, osteoporosis, hypercholesterolemia, reflux esophagitis, thoracic lumbar neuritis, historyofurinary incontinence, depression. Erica Stevens continues to reside in [place] at [facility]. Erica. Stevens remains bed bound, total ADL dependence. Erica Stevens requires turning and positioning by staff. Erica Stevens best require assistance by staff her feeding. Appetite has been fair. Erica Stevens does not her head to answers to questions. Staff  endorses Erica. Stevens has been about the same no new changes. Care plan meeting held 2 / 2 / 2021 with no changes noted. At present Erica Stevens's lying in bed. Erica Stevens appears comfortable, no visitors present. I visited and observed Erica Stevens. Explain purpose of palliative care visit and Erica Stevens and nodded her head yes. Erica. Stevens was cooperative with assessment. No meaningful discussion due to aphasia. Erica Stevens does appear comfortable. Medical goals so I get some full code and aggressive interventions. I have attempted to contact her Jefm Petty, daughter for update on palliative care visit. I updated nursing staff know any changes to current goals are plan of care. Will follow up in two months if needed or sooner should she declined.  Palliative Care was asked to help to continue to address goals of care.   CODE STATUS: Full code  PPS :30% HOSPICE ELIGIBILITY/DIAGNOSIS: TBD  PAST MEDICAL HISTORY:  Past Medical History:  Diagnosis Date  . Allergic rhinitis due to pollen   . Anxiety   . Brachial neuritis or radiculitis NOS   . Cellulitis and abscess of unspecified site   . Chronic kidney disease, stage I   . Chronic pain syndrome   . Congestive heart failure, unspecified   . Coronary atherosclerosis of unspecified type of vessel, native or graft   . Degeneration of cervical intervertebral disc   . Depression   . Hypertension   . Migraine, unspecified, without mention of intractable migraine without mention of status migrainosus   . 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 Osteoporosis, unspecified   . Pure hypercholesterolemia   . Reflux esophagitis   . Thoracic or lumbosacral neuritis or radiculitis, unspecified   . Unspecified urinary incontinence     SOCIAL HX:  Social History   Tobacco Use  . Smoking status: Current Every Day Smoker    Packs/day: 1.50    Types: Cigarettes  . Smokeless tobacco: Never Used  Substance Use Topics  . Alcohol use: No    ALLERGIES:  Allergies    Allergen Reactions  . Erythromycin     Other reaction(s): Unknown  . Ivp Dye [Iodinated Diagnostic Agents]   . Sulfa Antibiotics     Stated turns orange  . Sulfur      PERTINENT MEDICATIONS:  Outpatient Encounter Medications as of 10/25/2019  Medication Sig  . amLODipine (NORVASC) 10 MG tablet Take 1 tablet (10 mg total) by mouth daily.  Marland Kitchen aspirin 81 MG tablet Take 1 tablet (81 mg total) by mouth daily.  Marland Kitchen atorvastatin (LIPITOR) 80 MG tablet Take 1 tablet (80 mg total) by mouth daily.  . Cholecalciferol (VITAMIN D) 50 MCG (2000 UT) tablet Take 2,000 Units by mouth daily.  . clopidogrel (PLAVIX) 75 MG tablet Take 1 tablet (75 mg total) by mouth daily.  Marland Kitchen donepezil (ARICEPT) 5 MG tablet Take 5 mg by mouth daily.  . enalapril (VASOTEC) 20 MG tablet Take 1 tablet (20 mg total) by mouth daily.  . metoprolol succinate (TOPROL-XL) 25 MG 24 hr tablet TAKE ONE (1) TABLET EACH DAY (Patient taking differently: Take 25 mg by mouth daily. )  . vitamin B-12 (CYANOCOBALAMIN) 500 MCG tablet Take 500 mcg by mouth daily.   No facility-administered encounter medications on file as of 10/25/2019.    PHYSICAL EXAM:   General: debilitated, chronically ill, aphasic female Cardiovascular: regular rate and rhythm Pulmonary: clear ant fields Abdomen: soft, nontender, + bowel sounds Extremities: no edema, no joint deformities Neurological: functionally quadriplegic  Hektor Huston Prince Rome, NP

## 2019-10-28 ENCOUNTER — Other Ambulatory Visit: Payer: Self-pay

## 2019-11-12 DIAGNOSIS — I8312 Varicose veins of left lower extremity with inflammation: Secondary | ICD-10-CM | POA: Diagnosis not present

## 2019-11-12 DIAGNOSIS — M79671 Pain in right foot: Secondary | ICD-10-CM | POA: Diagnosis not present

## 2019-11-12 DIAGNOSIS — I739 Peripheral vascular disease, unspecified: Secondary | ICD-10-CM | POA: Diagnosis not present

## 2019-11-12 DIAGNOSIS — B351 Tinea unguium: Secondary | ICD-10-CM | POA: Diagnosis not present

## 2019-11-13 DIAGNOSIS — F0390 Unspecified dementia without behavioral disturbance: Secondary | ICD-10-CM | POA: Diagnosis not present

## 2019-11-13 DIAGNOSIS — F329 Major depressive disorder, single episode, unspecified: Secondary | ICD-10-CM | POA: Diagnosis not present

## 2019-11-13 DIAGNOSIS — I69354 Hemiplegia and hemiparesis following cerebral infarction affecting left non-dominant side: Secondary | ICD-10-CM | POA: Diagnosis not present

## 2019-11-13 DIAGNOSIS — F419 Anxiety disorder, unspecified: Secondary | ICD-10-CM | POA: Diagnosis not present

## 2019-12-17 DIAGNOSIS — I69354 Hemiplegia and hemiparesis following cerebral infarction affecting left non-dominant side: Secondary | ICD-10-CM | POA: Diagnosis not present

## 2019-12-17 DIAGNOSIS — I5020 Unspecified systolic (congestive) heart failure: Secondary | ICD-10-CM | POA: Diagnosis not present

## 2019-12-17 DIAGNOSIS — F0390 Unspecified dementia without behavioral disturbance: Secondary | ICD-10-CM | POA: Diagnosis not present

## 2020-01-23 DIAGNOSIS — F0390 Unspecified dementia without behavioral disturbance: Secondary | ICD-10-CM | POA: Diagnosis not present

## 2020-01-23 DIAGNOSIS — I5020 Unspecified systolic (congestive) heart failure: Secondary | ICD-10-CM | POA: Diagnosis not present

## 2020-01-23 DIAGNOSIS — I69354 Hemiplegia and hemiparesis following cerebral infarction affecting left non-dominant side: Secondary | ICD-10-CM | POA: Diagnosis not present

## 2020-02-13 ENCOUNTER — Non-Acute Institutional Stay: Payer: Medicare HMO | Admitting: Nurse Practitioner

## 2020-02-13 ENCOUNTER — Other Ambulatory Visit: Payer: Self-pay

## 2020-02-13 VITALS — BP 114/58 | HR 70 | Temp 97.2°F | Resp 18 | Wt 112.2 lb

## 2020-02-13 DIAGNOSIS — I639 Cerebral infarction, unspecified: Secondary | ICD-10-CM

## 2020-02-13 DIAGNOSIS — Z515 Encounter for palliative care: Secondary | ICD-10-CM | POA: Diagnosis not present

## 2020-02-13 NOTE — Progress Notes (Signed)
Therapist, nutritional Palliative Care Consult Note Telephone: [phone]  Fax: [phone]  PATIENT NAME: Kellene Mccleary DOB: 05/25/1951 MRN: [id number]  PRIMARY CARE PROVIDER:Dr Hodges/[facility] RESPONSIBLE PARTY:[name] daughter [phone], son Khadijatou Borak [phone]  RECOMMENDATIONS and PLAN: 1.ACP: Full code, goc discussion with daughter call pending  2.Generalized weaknesssecondary toStroke/CHF/CADcontinue with therapy as able. Encourage energy conservation and rest times.  3.Palliative care encounter; Palliative medicine team will continue to support patient, patient's family, and medical team. Visit consisted of counseling and education dealing with the complex and emotionally intense issues of symptom management and palliative care in the setting of serious and potentially life-threatening illness  I spent 60 minutes providing this consultation,  Started at 9:30am. More than 50% of the time in this consultation was spent coordinating communication.   HISTORY OF PRESENT ILLNESS:  Lael Wetherbee is a 69 y.o. year old female with multiple medical problems including Coronary artery disease s/p cabg, chronic systolic congestive heart failure, stroke, hyperlipidemia, dementia, allergies, anxiety, chronic kidney disease, chronic pain syndrome, degeneration of cervical intervertebral disc, hypertension, history of migraine, mitral valve disorder, osteoporosis, hypercholesterolemia, reflux esophagitis, thoracic lumbar neuritis, historyofurinary incontinence, depression. Ms Manalang continues to reside at [facility] at [facility]. [name] does remain bed-bound secondary to stroke, he mostly just. She does require assistance for turning, positioning, adl's, toileting. Ms Riemann does require assistance with tray setup in feeding. No recent hospitalizations, wounds, infections,  falls. Staff endorses Ms Vinsant does on occasion decline her tray, medications. At present Ms Nuckles is lying in bed. Ms Gignac appears debilitated but comfortable. No visitors present. I visited and observe Ms Reichl. Explained the purpose of palliative care visit. Ms Seubert nodded her head. Asked if Ms Purdum was hungry as her tray was sitting next to her untouched. Ms Goral replied no. Asked if Ms Ravenscroft was in pain and shortness of breath asked if [name] was in pain or shortness of breath. Ms Grulke replied "no". Attempted to talk about getting out of bed. Ms. Brickner replied "no". Limited verbal discussion. Ms Mohiuddin was cooperative with assessment. Emotional support provided. I called Ms Rauda daughter [name]. We talked about palliative care visit. We talked about purpose for palliative care. We talked about palliative care visit with Ms Moret. We talked about symptoms, appetite. [name] endorses they have been able to visit with Ms Angello. This past weekend they were able to take Ms Dirden out of the facility for her granddaughter seventeen-year-old funeral. [name] endorses Ms Befort tolerated being out fairly well. Ms. Serratore did eat while she was with her family. [name] endorses they did have difficulty when they attempted to return Ms Gellis to the facility as it was later in the evening and had trouble re-entering the facility. Discuss to Merchant navy officer of nursing to further discuss to discuss facility concerns. We talked about medical goals of care including code status as currently [name] is a full code. We talked about Health Care power of attorney as Ms Rehm does become a little confused at times. [name] talked about Ms. Radovich conversations pertaining to events that happened 20 years ago like they are currently happening today. We talked about chronic disease progression. We talked about realistic expectations. We talked about challenges with skilled facility, social isolation due to covid  pandemic. We talked about functional disability, getting out of bed. We talked about physical therapy and restorative exercises. We talked about option  of seeing if physical therapy can do a assessment for strengthening, assisting with getting out of bed. [name] endorses that she believes Ms Beedle is fearful of falling in the process of getting out of bed. Discussed will follow up about a physical therapy referral. We talked about role of palliative care and plan of care. [name] endorses that she will revisit code status with Ms Haskin and [place] of attorney. Discussed will follow up in one month as she is currently grieving her granddaughter. Therapeutic listening and emotional support provided. Contact information provided to carry. Questions answered to satisfaction. Palliative Care was asked to help to continue to address goals of care.   1 / 5 / 2021 weight 114.5 lbs 2/3 / 2021 weight 113.9 lb 3 /2 / 2021 weight 112.2 lbs BMI 21.9  CODE STATUS: Full code PPS: 30% HOSPICE ELIGIBILITY/DIAGNOSIS: TBD  PAST MEDICAL HISTORY:  Past Medical History:  Diagnosis Date  . Allergic rhinitis due to pollen   . Anxiety   . Brachial neuritis or radiculitis NOS   . Cellulitis and abscess of unspecified site   . Chronic kidney disease, stage I   . Chronic pain syndrome   . Congestive heart failure, unspecified   . Coronary atherosclerosis of unspecified type of vessel, native or graft   . Degeneration of cervical intervertebral disc   . Depression   . Hypertension   . Migraine, unspecified, without mention of intractable migraine without mention of status migrainosus   . 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 Osteoporosis, unspecified   . Pure hypercholesterolemia   . Reflux esophagitis   . Thoracic or lumbosacral neuritis or radiculitis, unspecified   . Unspecified urinary incontinence     SOCIAL HX:  Social History   Tobacco Use  . Smoking status: Current Every Day Smoker     Packs/day: 1.50    Types: Cigarettes  . Smokeless tobacco: Never Used  Substance Use Topics  . Alcohol use: No    ALLERGIES:  Allergies  Allergen Reactions  . Erythromycin     Other reaction(s): Unknown  . Ivp Dye [Iodinated Diagnostic Agents]   . Sulfa Antibiotics     Stated turns orange  . Sulfur      PERTINENT MEDICATIONS:  Outpatient Encounter Medications as of 02/13/2020  Medication Sig  . amLODipine (NORVASC) 10 MG tablet Take 1 tablet (10 mg total) by mouth daily.  Marland Kitchen aspirin 81 MG tablet Take 1 tablet (81 mg total) by mouth daily.  Marland Kitchen atorvastatin (LIPITOR) 80 MG tablet Take 1 tablet (80 mg total) by mouth daily.  . Cholecalciferol (VITAMIN D) 50 MCG (2000 UT) tablet Take 2,000 Units by mouth daily.  . clopidogrel (PLAVIX) 75 MG tablet Take 1 tablet (75 mg total) by mouth daily.  Marland Kitchen donepezil (ARICEPT) 5 MG tablet Take 5 mg by mouth daily.  . enalapril (VASOTEC) 20 MG tablet Take 1 tablet (20 mg total) by mouth daily.  . metoprolol succinate (TOPROL-XL) 25 MG 24 hr tablet TAKE ONE (1) TABLET EACH DAY (Patient taking differently: Take 25 mg by mouth daily. )  . vitamin B-12 (CYANOCOBALAMIN) 500 MCG tablet Take 500 mcg by mouth daily.   No facility-administered encounter medications on file as of 02/13/2020.    PHYSICAL EXAM:   General: NAD, debilitated, chronically ill female Cardiovascular: regular rate and rhythm Pulmonary: clear ant fields Neurological: functional quadriplegic  Vi Biddinger Ihor Gully, NP

## 2020-03-02 DIAGNOSIS — F0390 Unspecified dementia without behavioral disturbance: Secondary | ICD-10-CM | POA: Diagnosis not present

## 2020-03-02 DIAGNOSIS — I5020 Unspecified systolic (congestive) heart failure: Secondary | ICD-10-CM | POA: Diagnosis not present

## 2020-03-02 DIAGNOSIS — I69354 Hemiplegia and hemiparesis following cerebral infarction affecting left non-dominant side: Secondary | ICD-10-CM | POA: Diagnosis not present

## 2020-04-01 DIAGNOSIS — I69354 Hemiplegia and hemiparesis following cerebral infarction affecting left non-dominant side: Secondary | ICD-10-CM | POA: Diagnosis not present

## 2020-04-01 DIAGNOSIS — I5020 Unspecified systolic (congestive) heart failure: Secondary | ICD-10-CM | POA: Diagnosis not present

## 2020-04-01 DIAGNOSIS — F0390 Unspecified dementia without behavioral disturbance: Secondary | ICD-10-CM | POA: Diagnosis not present

## 2020-04-08 ENCOUNTER — Other Ambulatory Visit: Payer: Self-pay

## 2020-04-08 ENCOUNTER — Non-Acute Institutional Stay: Payer: Medicare HMO | Admitting: Nurse Practitioner

## 2020-04-08 ENCOUNTER — Encounter: Payer: Self-pay | Admitting: Nurse Practitioner

## 2020-04-08 DIAGNOSIS — I639 Cerebral infarction, unspecified: Secondary | ICD-10-CM | POA: Diagnosis not present

## 2020-04-08 DIAGNOSIS — Z515 Encounter for palliative care: Secondary | ICD-10-CM

## 2020-04-08 NOTE — Progress Notes (Signed)
Therapist, nutritional Palliative Care Consult Note Telephone: [phone]  Fax: [phone]  PATIENT NAME: Erica Stevens DOB: Jan 11, 1951 MRN: [id number]  PRIMARY CARE PROVIDER:Dr Erica Stevens/Erica Stevens RESPONSIBLE PARTY:Erica Stevens daughter [phone], Stevens Erica Stevens [phone]  RECOMMENDATIONS and PLAN: 1.ACP: Full code, goc discussion with daughter call pending  2.Generalized weaknesssecondary toStroke/CHF/CADcontinue with therapy as able. Encourage energy conservation and rest times.  3.Palliative care encounter; Palliative medicine team will continue to support patient, patient's family, and medical team. Stevens consisted of counseling and education dealing with the complex and emotionally intense issues of symptom management and palliative care in the setting of serious and potentially life-threatening illness  4. Follow-up Stevens 8 weeks or sooner if declines for ongoing monitoring weights, debility  I spent 75 minutes providing this consultation, starting at 10:00am. More than 50% of the time in this consultation was spent coordinating communication.   HISTORY OF PRESENT ILLNESS:  Erica Stevens is a 69 y.o. year old female with multiple medical problems including Coronary artery disease s/p cabg, chronic systolic congestive heart failure, stroke, hyperlipidemia, dementia, allergies, anxiety, chronic kidney disease, chronic pain syndrome, degeneration of cervical intervertebral disc, hypertension, history of migraine, mitral valve disorder, osteoporosis, hypercholesterolemia, reflux esophagitis, thoracic lumbar neuritis, historyofurinary incontinence, depression. Ms. Better continues to reside at [facility] at [facility] Stevens. Ms Erica Stevens remains bed-bound, total ADL dependents including repositioning and turning, mobility and transfers. Ms. Erica Stevens does have episodes of incontinence.  Ms Erica Stevens desert choir assistance with speeding though she is able to feed herself. Current weight 112.0lbs. No recent wounds, infections, hospitalizations, falls. At present Ms Erica Stevens is lying in bed reading a book. Ms Erica Stevens does make eye contact with verbal cues. Typically Ms Erica Stevens has not verbalize words with palliative care consult. Today was different she did speak with me. Ms. Erica Stevens answered questions. We talked about symptoms of pain and shortness of breath what she replied no to. We talked about the book she was reading. Limited words though she was interactive. We talked about her daughter visiting which was several weeks ago. We talked about her appetite. Ms Erica Stevens endorses she does not remember what she had to eat today for breakfast. We talked about mobility and getting out of bed. We talked about importance of socialization. Explained role of palliative care. Ms Erica Stevens has a very flat affect. Ms. Erica Stevens did say "thank you" for the Stevens mostly supportive. Emotional support provided. Medical goals reviewed. I called Erica Son, Ms Erica Stevens daughter for update on palliative care Stevens. We talked about palliative care Stevens with Ms Erica Stevens. We talked about wait though not a current weight documented will asked staff to get a current weight. We talked about challenges with residing at skilled facility. Erica Stevens endorses she visited about two weeks ago. Ms. Erica Stevens  cried at that time. Erica Stevens endorses since Ms Erica Stevens had a stroke she does cries easily when Erica Stevens. Erica Stevens endorses Ms. Erica Stevens requests to go home. Erica Stevens endorses that her son, Ms Erica Stevens grandson 15 years old was going to camp and developed a fever so that limited from visiting recently. Erica Stevens endorses her husband, Ms. Erica Stevens Stevens-in law is on transplant list for kidneys and pancreas. Family has many stressors presently. We talked about medical goals of care. We talked about increase in visiting hours, Erica Stevens endorses she is hopeful to be able  to Stevens a little more frequently when things settle down a bit. We talked about role of  palliative care and plan of care. Erica Stevens endorses she was thankful for palliative care Stevens. Therapeutic listening and emotional support provided. No new changes or recommendations at this time. Will continue current goals of care. Will follow up in two months if needed or sooner should Ms Korber decline. Erica in agreement. I updated nursing staff in the new changes to current goals or plan of care  Palliative Care was asked to help to continue to address goals of care.   CODE STATUS: Full code  PPS: 30% HOSPICE ELIGIBILITY/DIAGNOSIS: TBD  PAST MEDICAL HISTORY:  Past Medical History:  Diagnosis Date  . Allergic rhinitis due to pollen   . Anxiety   . Brachial neuritis or radiculitis NOS   . Cellulitis and abscess of unspecified site   . Chronic kidney disease, stage I   . Chronic pain syndrome   . Congestive heart failure, unspecified   . Coronary atherosclerosis of unspecified type of vessel, native or graft   . Degeneration of cervical intervertebral disc   . Depression   . Hypertension   . Migraine, unspecified, without mention of intractable migraine without mention of status migrainosus   . 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 Osteoporosis, unspecified   . Pure hypercholesterolemia   . Reflux esophagitis   . Thoracic or lumbosacral neuritis or radiculitis, unspecified   . Unspecified urinary incontinence     SOCIAL HX:  Social History   Tobacco Use  . Smoking status: Current Every Day Smoker    Packs/day: 1.50    Types: Cigarettes  . Smokeless tobacco: Never Used  Substance Use Topics  . Alcohol use: No    ALLERGIES:  Allergies  Allergen Reactions  . Erythromycin     Other reaction(s): Unknown  . Ivp Dye [Iodinated Diagnostic Agents]   . Sulfa Antibiotics     Stated turns orange  . Sulfur      PERTINENT MEDICATIONS:  Outpatient Encounter Medications as of 04/08/2020    Medication Sig  . amLODipine (NORVASC) 10 MG tablet Take 1 tablet (10 mg total) by mouth daily.  Marland Kitchen aspirin 81 MG tablet Take 1 tablet (81 mg total) by mouth daily.  Marland Kitchen atorvastatin (LIPITOR) 80 MG tablet Take 1 tablet (80 mg total) by mouth daily.  . Cholecalciferol (VITAMIN D) 50 MCG (2000 UT) tablet Take 2,000 Units by mouth daily.  . clopidogrel (PLAVIX) 75 MG tablet Take 1 tablet (75 mg total) by mouth daily.  Marland Kitchen donepezil (ARICEPT) 5 MG tablet Take 5 mg by mouth daily.  . enalapril (VASOTEC) 20 MG tablet Take 1 tablet (20 mg total) by mouth daily.  . metoprolol succinate (TOPROL-XL) 25 MG 24 hr tablet TAKE ONE (1) TABLET EACH DAY (Patient taking differently: Take 25 mg by mouth daily. )  . vitamin B-12 (CYANOCOBALAMIN) 500 MCG tablet Take 500 mcg by mouth daily.   No facility-administered encounter medications on file as of 04/08/2020.    PHYSICAL EXAM:   General: NAD, debilitated, chronically ill, female Cardiovascular: regular rate and rhythm Pulmonary: clear ant fields Neurological: functional quadriplegic  Tangela Dolliver Prince [place], NP

## 2020-05-06 ENCOUNTER — Other Ambulatory Visit: Payer: Self-pay

## 2020-05-06 ENCOUNTER — Encounter: Payer: Self-pay | Admitting: Nurse Practitioner

## 2020-05-06 ENCOUNTER — Non-Acute Institutional Stay: Payer: Medicare HMO | Admitting: Nurse Practitioner

## 2020-05-06 VITALS — BP 122/78 | HR 64 | Temp 97.4°F | Resp 18 | Wt 118.8 lb

## 2020-05-06 DIAGNOSIS — I639 Cerebral infarction, unspecified: Secondary | ICD-10-CM | POA: Diagnosis not present

## 2020-05-06 DIAGNOSIS — Z515 Encounter for palliative care: Secondary | ICD-10-CM | POA: Diagnosis not present

## 2020-05-06 NOTE — Progress Notes (Signed)
Therapist, nutritional Palliative Care Consult Note Telephone: [phone]  Fax: [phone]  PATIENT NAME: Erica Stevens DOB: 1951/05/25 MRN: [id number] PRIMARY CARE PROVIDER:Dr Hodges/[facility] RESPONSIBLE PARTY:[name] daughter [phone], Stevens Kenna Seward [phone]  RECOMMENDATIONS and PLAN: 1.ACP: Full code, goc discussion with daughter call pending  2.Generalized weaknesssecondary toStroke/CHF/CADcontinue with therapy as able. Encourage energy conservation and rest times.  3.Palliative care encounter; Palliative medicine team will continue to support patient, patient's family, and medical team. Visit consisted of counseling and education dealing with the complex and emotionally intense issues of symptom management and palliative care in the setting of serious and potentially life-threatening illness  4. Follow-up visit 8 weeks or sooner if declines for ongoing monitoring weights, debility  I spent 60 minutes providing this consultation, start 12:00pm. More than 50% of the time in this consultation was spent coordinating communication.   HISTORY OF PRESENT ILLNESS:  Erica Stevens is a 69 y.o. year old female with multiple medical problems including Coronary artery disease s/p cabg, chronic systolic congestive heart failure, stroke, hyperlipidemia, dementia, allergies, anxiety, chronic kidney disease, chronic pain syndrome, degeneration of cervical intervertebral disc, hypertension, history of migraine, mitral valve disorder, osteoporosis, hypercholesterolemia, reflux esophagitis, thoracic lumbar neuritis, historyofurinary incontinence, depression.Ms. Erica Stevens continues to reside at [facility] at [facility]. Ms. Erica Stevens does require assistance for turning in positioning, transfer, mobility, total ideal for dressing, bathing, toileting. Ms Erica Stevens does feed herself an  appetite has been fair with current weight 111.8 with BMI 22.6 with around a 6 pound weight lost within 6 months. Ms Erica Stevens is currently on a regular diet regular texture regular liquid consistency with med plus supplement. Staff endorses no recent falls, wounds, infections, hospitalizations. Ms Erica Stevens does remain a full code with ongoing discussions with daughter [name] [phone] for code status and medical goals of care. At present Ms Erica Stevens is lying in bed. Ms. Erica Stevens was looking at a magazine, appears debilitated but comfortable. No visitors present. I visited and observed Ms Erica Stevens. Ms Erica Stevens made eye contact. Explained purpose of palliative care visit. Ms Erica Stevens and I talked about symptoms of pain and shortness of breath. Ms. Erica Stevens is not experiencing. Reposition Ms Erica Stevens in bed. Ms Erica Stevens was cooperative with assessment. We talked about how she was feeling today. [name] endorses that she is a little tired. We talked about Ms Erica Stevens appetite and she replied she was not hungry. We talked about mobility and getting out of bed. Ms. Erica Stevens politely replied "no". We talked about Ms Erica Stevens daughter and their visit recently. Ms Erica Stevens endorses that her daughter tries to visit frequently. Ms Erica Stevens Stevens-in-law was recently started on hemodialysis so her daughter has been caregiver for both. We talked about challenges with skilled facility. We talked about challenges relying on others to do things for you. We talked about quality of life. Ms Erica Stevens was interactive today with palliative care discussion. Discuss with Ms Erica Stevens will contact her daughter Erica Stevens for further discussion of goals of care and update on palliative care visit. Ms Erica Stevens in agreement. I called Erica Son Ms Erica Stevens daughter. We talked about purpose of palliative care visit. We talked about palliative care visit with Ms Erica Stevens. We talked about symptoms, appetite. We talked about carries last visit with Ms Erica Stevens. We talked about  mobility. Erica Stevens endorses she does see Ms Erica Stevens, she cries, requesting to go home. Erica Stevens talked about her husband currently having dialysis so she would  bring her home if she was able but it's very difficult to carry for both at the same time with Ms Erica Stevens increase in skill level. We talked about medical goals of care and cleaning aggressive vs conservative versus comfort care. We talked about code status as she currently is a full code. Erica Stevens endorses she has not been able to talk to Ms Even about code status. Erica Stevens and I talked about option of palliative care discussing with Ms Erica Stevens or with with Ms Erica Stevens and Erica Stevens at the same time or carry talking with Ms Erica Stevens privately. Erica Stevens endorses she would like to think about it. For now Ms. Erica Stevens wil remaining full code with full scope of treatment. [name] in agreement. We talked about role of palliative care and plan of care. We talked about at present time Ms Erica Stevens does appear stable. Will continue to monitor and follow with palliative with next visit in two months if needed her sooner she declined. Discussed with Erica Stevens that should she decide sooner about code status or wants to have a visit to discuss with Ms Erica Stevens can schedule an earlier visit. Therapeutic listening and emotional support provided. Contact information provided. Questions answered to satisfaction. I updated nursing staff no new changes at present time to goals or plan of care.  Palliative Care was asked to help to continue to address goals of care.   CODE STATUS: Full code  PPS: 30% HOSPICE ELIGIBILITY/DIAGNOSIS: TBD  PAST MEDICAL HISTORY:  Past Medical History:  Diagnosis Date   Allergic rhinitis due to pollen    Anxiety    Brachial neuritis or radiculitis NOS    Cellulitis and abscess of unspecified site    Chronic kidney disease, stage I    Chronic pain syndrome    Congestive heart failure, unspecified    Coronary atherosclerosis of unspecified type of  vessel, native or graft    Degeneration of cervical intervertebral disc    Depression    Hypertension    Migraine, unspecified, without mention of intractable migraine without mention of status migrainosus    Mitral valve disorders(424.0)    Osteoporosis, unspecified    Pure hypercholesterolemia    Reflux esophagitis    Thoracic or lumbosacral neuritis or radiculitis, unspecified    Unspecified urinary incontinence     SOCIAL HX:  Social History   Tobacco Use   Smoking status: Current Every Day Smoker    Packs/day: 1.50    Types: Cigarettes   Smokeless tobacco: Never Used  Substance Use Topics   Alcohol use: No    ALLERGIES:  Allergies  Allergen Reactions   Erythromycin     Other reaction(s): Unknown   Ivp Dye [Iodinated Diagnostic Agents]    Sulfa Antibiotics     Stated turns orange   Sulfur      PERTINENT MEDICATIONS:  Outpatient Encounter Medications as of 05/06/2020  Medication Sig   amLODipine (NORVASC) 10 MG tablet Take 1 tablet (10 mg total) by mouth daily.   aspirin 81 MG tablet Take 1 tablet (81 mg total) by mouth daily.   atorvastatin (LIPITOR) 80 MG tablet Take 1 tablet (80 mg total) by mouth daily.   Cholecalciferol (VITAMIN D) 50 MCG (2000 UT) tablet Take 2,000 Units by mouth daily.   clopidogrel (PLAVIX) 75 MG tablet Take 1 tablet (75 mg total) by mouth daily.   donepezil (ARICEPT) 5 MG tablet Take 5 mg by mouth daily.   enalapril (VASOTEC) 20 MG tablet Take 1 tablet (20 mg total) by  mouth daily.   metoprolol succinate (TOPROL-XL) 25 MG 24 hr tablet TAKE ONE (1) TABLET EACH DAY (Patient taking differently: Take 25 mg by mouth daily. )   vitamin B-12 (CYANOCOBALAMIN) 500 MCG tablet Take 500 mcg by mouth daily.   No facility-administered encounter medications on file as of 05/06/2020.    PHYSICAL EXAM:   General: pleasant debilitated, chronically ill, female Cardiovascular: regular rate and rhythm Pulmonary: clear ant fields;  decrease bases Neurological: functional quadriplegic  Malcome Ambrocio Prince [place], NP

## 2020-06-10 DIAGNOSIS — I69354 Hemiplegia and hemiparesis following cerebral infarction affecting left non-dominant side: Secondary | ICD-10-CM | POA: Diagnosis not present

## 2020-06-10 DIAGNOSIS — R278 Other lack of coordination: Secondary | ICD-10-CM | POA: Diagnosis not present

## 2020-06-10 DIAGNOSIS — M6281 Muscle weakness (generalized): Secondary | ICD-10-CM | POA: Diagnosis not present

## 2020-06-11 DIAGNOSIS — R278 Other lack of coordination: Secondary | ICD-10-CM | POA: Diagnosis not present

## 2020-06-11 DIAGNOSIS — I69354 Hemiplegia and hemiparesis following cerebral infarction affecting left non-dominant side: Secondary | ICD-10-CM | POA: Diagnosis not present

## 2020-06-11 DIAGNOSIS — M6281 Muscle weakness (generalized): Secondary | ICD-10-CM | POA: Diagnosis not present

## 2020-06-12 DIAGNOSIS — M6281 Muscle weakness (generalized): Secondary | ICD-10-CM | POA: Diagnosis not present

## 2020-06-12 DIAGNOSIS — I69354 Hemiplegia and hemiparesis following cerebral infarction affecting left non-dominant side: Secondary | ICD-10-CM | POA: Diagnosis not present

## 2020-06-12 DIAGNOSIS — R278 Other lack of coordination: Secondary | ICD-10-CM | POA: Diagnosis not present

## 2020-06-15 DIAGNOSIS — R278 Other lack of coordination: Secondary | ICD-10-CM | POA: Diagnosis not present

## 2020-06-15 DIAGNOSIS — I69354 Hemiplegia and hemiparesis following cerebral infarction affecting left non-dominant side: Secondary | ICD-10-CM | POA: Diagnosis not present

## 2020-06-15 DIAGNOSIS — M6281 Muscle weakness (generalized): Secondary | ICD-10-CM | POA: Diagnosis not present

## 2020-06-16 DIAGNOSIS — I69354 Hemiplegia and hemiparesis following cerebral infarction affecting left non-dominant side: Secondary | ICD-10-CM | POA: Diagnosis not present

## 2020-06-16 DIAGNOSIS — R278 Other lack of coordination: Secondary | ICD-10-CM | POA: Diagnosis not present

## 2020-06-16 DIAGNOSIS — M6281 Muscle weakness (generalized): Secondary | ICD-10-CM | POA: Diagnosis not present

## 2020-06-17 DIAGNOSIS — R278 Other lack of coordination: Secondary | ICD-10-CM | POA: Diagnosis not present

## 2020-06-17 DIAGNOSIS — M6281 Muscle weakness (generalized): Secondary | ICD-10-CM | POA: Diagnosis not present

## 2020-06-17 DIAGNOSIS — I69354 Hemiplegia and hemiparesis following cerebral infarction affecting left non-dominant side: Secondary | ICD-10-CM | POA: Diagnosis not present

## 2020-06-18 DIAGNOSIS — R278 Other lack of coordination: Secondary | ICD-10-CM | POA: Diagnosis not present

## 2020-06-18 DIAGNOSIS — M6281 Muscle weakness (generalized): Secondary | ICD-10-CM | POA: Diagnosis not present

## 2020-06-18 DIAGNOSIS — I69354 Hemiplegia and hemiparesis following cerebral infarction affecting left non-dominant side: Secondary | ICD-10-CM | POA: Diagnosis not present

## 2020-06-19 DIAGNOSIS — M6281 Muscle weakness (generalized): Secondary | ICD-10-CM | POA: Diagnosis not present

## 2020-06-19 DIAGNOSIS — R278 Other lack of coordination: Secondary | ICD-10-CM | POA: Diagnosis not present

## 2020-06-19 DIAGNOSIS — I69354 Hemiplegia and hemiparesis following cerebral infarction affecting left non-dominant side: Secondary | ICD-10-CM | POA: Diagnosis not present

## 2020-06-22 DIAGNOSIS — R278 Other lack of coordination: Secondary | ICD-10-CM | POA: Diagnosis not present

## 2020-06-22 DIAGNOSIS — M6281 Muscle weakness (generalized): Secondary | ICD-10-CM | POA: Diagnosis not present

## 2020-06-22 DIAGNOSIS — I69354 Hemiplegia and hemiparesis following cerebral infarction affecting left non-dominant side: Secondary | ICD-10-CM | POA: Diagnosis not present

## 2020-06-23 DIAGNOSIS — I69354 Hemiplegia and hemiparesis following cerebral infarction affecting left non-dominant side: Secondary | ICD-10-CM | POA: Diagnosis not present

## 2020-06-23 DIAGNOSIS — M6281 Muscle weakness (generalized): Secondary | ICD-10-CM | POA: Diagnosis not present

## 2020-06-23 DIAGNOSIS — R278 Other lack of coordination: Secondary | ICD-10-CM | POA: Diagnosis not present

## 2020-06-24 ENCOUNTER — Non-Acute Institutional Stay: Payer: Medicare HMO | Admitting: Nurse Practitioner

## 2020-06-24 ENCOUNTER — Encounter: Payer: Self-pay | Admitting: Nurse Practitioner

## 2020-06-24 VITALS — BP 128/69 | HR 88 | Temp 97.4°F | Resp 18 | Wt 107.9 lb

## 2020-06-24 DIAGNOSIS — I639 Cerebral infarction, unspecified: Secondary | ICD-10-CM | POA: Diagnosis not present

## 2020-06-24 DIAGNOSIS — Z515 Encounter for palliative care: Secondary | ICD-10-CM | POA: Diagnosis not present

## 2020-06-24 DIAGNOSIS — I69354 Hemiplegia and hemiparesis following cerebral infarction affecting left non-dominant side: Secondary | ICD-10-CM | POA: Diagnosis not present

## 2020-06-24 DIAGNOSIS — M6281 Muscle weakness (generalized): Secondary | ICD-10-CM | POA: Diagnosis not present

## 2020-06-24 DIAGNOSIS — R278 Other lack of coordination: Secondary | ICD-10-CM | POA: Diagnosis not present

## 2020-06-24 NOTE — Progress Notes (Signed)
Therapist, nutritional Palliative Care Consult Note Telephone: [phone]  Fax: [phone]  PATIENT NAME: Erica Stevens DOB: September 18, 1950 MRN: [id number]   PRIMARY CARE PROVIDER:Dr Hodges/[facility] RESPONSIBLE PARTY:[name] Stevens [phone], Stevens Khloey Chern [phone]  RECOMMENDATIONS and PLAN: 1.ACP: Full code, ongoing discussion goc  2.Generalized weaknesssecondary toStroke/CHF/CADcontinue with therapy as able. Encourage energy conservation and rest times.  3.Palliative care encounter; Palliative medicine team will continue to support patient, patient's family, and medical team. Visit consisted of counseling and education dealing with the complex and emotionally intense issues of symptom management and palliative care in the setting of serious and potentially life-threatening illness  4. Follow-up visit 8 weeks or sooner if declines for ongoing monitoring weights, debility  I spent 70 minutes providing this consultation, started at 11:30am. More than 50% of the time in this consultation was spent coordinating communication.   HISTORY OF PRESENT ILLNESS:  Erica Stevens is a 69 y.o. year old female with multiple medical problems including Coronary artery disease s/p cabg, chronic systolic congestive heart failure, stroke, hyperlipidemia, dementia, allergies, anxiety, chronic kidney disease, chronic pain syndrome, degeneration of cervical intervertebral disc, hypertension, history of migraine, mitral valve disorder, osteoporosis, hypercholesterolemia, reflux esophagitis, thoracic lumbar neuritis, historyofurinary incontinence, depression. Erica Stevens continues to reside at [facility]. Erica Stevens is a total lift to the recliner where she is able to sit up during the day. Ms Vickki Stevens is total care for bathing, dressing, toileting as she is incontinent. Ms  Vickki Stevens is able to feed herself after tray setup and some assistance. Appetite has been fair. She has lost weight since at the facility, slow decline. Ms Stevens does express her needs but difficult at times to understand speech. Normally Ms Claudette Stevens is quiet. Staff endorses no new changes their concerns. Occupational therapy evaluation began working with Ms Stevens to improve activity tolerance including self-care tasks. Erica Stevens is followed by a Podiatry at the facility. No recent falls, wounds, infections, hospitalizations. At present Ms Stevens is sitting in the recliner chair. Ms Joesphine Stevens appears to debilitated, comfortable. No visitors present. I visited and observed Ms Stevens. Ms Stevens did make eye contact with verbal cues. Ms Stevens did answer questions yes or no. Ms Stevens was very limited with verbal discussion. Ms Stevens did ask for her Twix bar to be opened from the wrapper. Ms Stevens was cooperative with assessment. Emotional support provided. Medical gold reviewed. I called [name] Stevens carry for update on palliative care visit. Erica Stevens and I talked about purpose of palliative care. We talked about how much less than has been doing. We talked about the last time that Erica Stevens was able to visit. We talked about symptom, appetite and slight weight loss. We talked about stability and importance of mobility being out of bed. Erica Stevens endorses she did have a discussion with Ms Stevens about the importance of being out of bed daily. We talked about medical goals of care. We talked about quality of life. We talked about coping strategies. We talked about Erica Stevens, her husband has been ill and has been requiring a lot of attention concerning his health. We talked about at present time Ms Stevens appear stable. We talked about follow up palliative care visit in two months if needed or sooner should she declined. [name] in agreement. I have updated nursing staff no changes at present time.  Palliative Care was asked  to help to continue to address  goals of care.   CODE STATUS: Full code  PPS: 30% HOSPICE ELIGIBILITY/DIAGNOSIS: TBD  PAST MEDICAL HISTORY:  Past Medical History:  Diagnosis Date  . Allergic rhinitis due to pollen   . Anxiety   . Brachial neuritis or radiculitis NOS   . Cellulitis and abscess of unspecified site   . Chronic kidney disease, stage I   . Chronic pain syndrome   . Congestive heart failure, unspecified   . Coronary atherosclerosis of unspecified type of vessel, native or graft   . Degeneration of cervical intervertebral disc   . Depression   . Hypertension   . Migraine, unspecified, without mention of intractable migraine without mention of status migrainosus   . 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 Osteoporosis, unspecified   . Pure hypercholesterolemia   . Reflux esophagitis   . Thoracic or lumbosacral neuritis or radiculitis, unspecified   . Unspecified urinary incontinence     SOCIAL HX:  Social History   Tobacco Use  . Smoking status: Current Every Day Smoker    Packs/day: 1.50    Types: Cigarettes  . Smokeless tobacco: Never Used  Substance Use Topics  . Alcohol use: No    ALLERGIES:  Allergies  Allergen Reactions  . Erythromycin     Other reaction(s): Unknown  . Ivp Dye [Iodinated Diagnostic Agents]   . Sulfa Antibiotics     Stated turns orange  . Sulfur      PERTINENT MEDICATIONS:  Outpatient Encounter Medications as of 06/24/2020  Medication Sig  . amLODipine (NORVASC) 10 MG tablet Take 1 tablet (10 mg total) by mouth daily.  Marland Kitchen aspirin 81 MG tablet Take 1 tablet (81 mg total) by mouth daily.  Marland Kitchen atorvastatin (LIPITOR) 80 MG tablet Take 1 tablet (80 mg total) by mouth daily.  . Cholecalciferol (VITAMIN D) 50 MCG (2000 UT) tablet Take 2,000 Units by mouth daily.  . clopidogrel (PLAVIX) 75 MG tablet Take 1 tablet (75 mg total) by mouth daily.  Marland Kitchen donepezil (ARICEPT) 5 MG tablet Take 5 mg by mouth daily.  . enalapril (VASOTEC) 20 MG tablet Take 1  tablet (20 mg total) by mouth daily.  . metoprolol succinate (TOPROL-XL) 25 MG 24 hr tablet TAKE ONE (1) TABLET EACH DAY (Patient taking differently: Take 25 mg by mouth daily. )  . vitamin B-12 (CYANOCOBALAMIN) 500 MCG tablet Take 500 mcg by mouth daily.   No facility-administered encounter medications on file as of 06/24/2020.    PHYSICAL EXAM:   General: debilitated chronically ill, female Cardiovascular: regular rate and rhythm Pulmonary: clear ant fields Neurological: functionally quadriplegic  Majestic Brister Prince [place], NP

## 2020-06-25 ENCOUNTER — Other Ambulatory Visit: Payer: Self-pay

## 2020-06-25 DIAGNOSIS — I69354 Hemiplegia and hemiparesis following cerebral infarction affecting left non-dominant side: Secondary | ICD-10-CM | POA: Diagnosis not present

## 2020-06-25 DIAGNOSIS — R278 Other lack of coordination: Secondary | ICD-10-CM | POA: Diagnosis not present

## 2020-06-25 DIAGNOSIS — M6281 Muscle weakness (generalized): Secondary | ICD-10-CM | POA: Diagnosis not present

## 2020-06-26 DIAGNOSIS — R278 Other lack of coordination: Secondary | ICD-10-CM | POA: Diagnosis not present

## 2020-06-26 DIAGNOSIS — M6281 Muscle weakness (generalized): Secondary | ICD-10-CM | POA: Diagnosis not present

## 2020-06-26 DIAGNOSIS — I69354 Hemiplegia and hemiparesis following cerebral infarction affecting left non-dominant side: Secondary | ICD-10-CM | POA: Diagnosis not present

## 2020-06-29 DIAGNOSIS — M6281 Muscle weakness (generalized): Secondary | ICD-10-CM | POA: Diagnosis not present

## 2020-06-29 DIAGNOSIS — I69354 Hemiplegia and hemiparesis following cerebral infarction affecting left non-dominant side: Secondary | ICD-10-CM | POA: Diagnosis not present

## 2020-06-29 DIAGNOSIS — R278 Other lack of coordination: Secondary | ICD-10-CM | POA: Diagnosis not present

## 2020-06-30 DIAGNOSIS — F0390 Unspecified dementia without behavioral disturbance: Secondary | ICD-10-CM | POA: Diagnosis not present

## 2020-06-30 DIAGNOSIS — R278 Other lack of coordination: Secondary | ICD-10-CM | POA: Diagnosis not present

## 2020-06-30 DIAGNOSIS — M6281 Muscle weakness (generalized): Secondary | ICD-10-CM | POA: Diagnosis not present

## 2020-06-30 DIAGNOSIS — I69354 Hemiplegia and hemiparesis following cerebral infarction affecting left non-dominant side: Secondary | ICD-10-CM | POA: Diagnosis not present

## 2020-06-30 DIAGNOSIS — I5020 Unspecified systolic (congestive) heart failure: Secondary | ICD-10-CM | POA: Diagnosis not present

## 2020-06-30 DIAGNOSIS — F419 Anxiety disorder, unspecified: Secondary | ICD-10-CM | POA: Diagnosis not present

## 2020-07-01 DIAGNOSIS — R278 Other lack of coordination: Secondary | ICD-10-CM | POA: Diagnosis not present

## 2020-07-01 DIAGNOSIS — I69354 Hemiplegia and hemiparesis following cerebral infarction affecting left non-dominant side: Secondary | ICD-10-CM | POA: Diagnosis not present

## 2020-07-01 DIAGNOSIS — M6281 Muscle weakness (generalized): Secondary | ICD-10-CM | POA: Diagnosis not present

## 2020-07-02 DIAGNOSIS — I69354 Hemiplegia and hemiparesis following cerebral infarction affecting left non-dominant side: Secondary | ICD-10-CM | POA: Diagnosis not present

## 2020-07-02 DIAGNOSIS — R278 Other lack of coordination: Secondary | ICD-10-CM | POA: Diagnosis not present

## 2020-07-02 DIAGNOSIS — M6281 Muscle weakness (generalized): Secondary | ICD-10-CM | POA: Diagnosis not present

## 2020-07-03 DIAGNOSIS — R278 Other lack of coordination: Secondary | ICD-10-CM | POA: Diagnosis not present

## 2020-07-03 DIAGNOSIS — I69354 Hemiplegia and hemiparesis following cerebral infarction affecting left non-dominant side: Secondary | ICD-10-CM | POA: Diagnosis not present

## 2020-07-03 DIAGNOSIS — M6281 Muscle weakness (generalized): Secondary | ICD-10-CM | POA: Diagnosis not present

## 2020-07-06 DIAGNOSIS — R278 Other lack of coordination: Secondary | ICD-10-CM | POA: Diagnosis not present

## 2020-07-06 DIAGNOSIS — I69354 Hemiplegia and hemiparesis following cerebral infarction affecting left non-dominant side: Secondary | ICD-10-CM | POA: Diagnosis not present

## 2020-07-06 DIAGNOSIS — M6281 Muscle weakness (generalized): Secondary | ICD-10-CM | POA: Diagnosis not present

## 2020-07-07 DIAGNOSIS — I69354 Hemiplegia and hemiparesis following cerebral infarction affecting left non-dominant side: Secondary | ICD-10-CM | POA: Diagnosis not present

## 2020-07-07 DIAGNOSIS — R278 Other lack of coordination: Secondary | ICD-10-CM | POA: Diagnosis not present

## 2020-07-07 DIAGNOSIS — M6281 Muscle weakness (generalized): Secondary | ICD-10-CM | POA: Diagnosis not present

## 2020-07-08 DIAGNOSIS — I69354 Hemiplegia and hemiparesis following cerebral infarction affecting left non-dominant side: Secondary | ICD-10-CM | POA: Diagnosis not present

## 2020-07-08 DIAGNOSIS — M6281 Muscle weakness (generalized): Secondary | ICD-10-CM | POA: Diagnosis not present

## 2020-07-08 DIAGNOSIS — R278 Other lack of coordination: Secondary | ICD-10-CM | POA: Diagnosis not present

## 2020-07-09 DIAGNOSIS — I69354 Hemiplegia and hemiparesis following cerebral infarction affecting left non-dominant side: Secondary | ICD-10-CM | POA: Diagnosis not present

## 2020-07-09 DIAGNOSIS — R278 Other lack of coordination: Secondary | ICD-10-CM | POA: Diagnosis not present

## 2020-07-09 DIAGNOSIS — M6281 Muscle weakness (generalized): Secondary | ICD-10-CM | POA: Diagnosis not present

## 2020-07-10 DIAGNOSIS — I69354 Hemiplegia and hemiparesis following cerebral infarction affecting left non-dominant side: Secondary | ICD-10-CM | POA: Diagnosis not present

## 2020-07-10 DIAGNOSIS — M6281 Muscle weakness (generalized): Secondary | ICD-10-CM | POA: Diagnosis not present

## 2020-07-10 DIAGNOSIS — R278 Other lack of coordination: Secondary | ICD-10-CM | POA: Diagnosis not present

## 2020-07-11 DIAGNOSIS — E119 Type 2 diabetes mellitus without complications: Secondary | ICD-10-CM | POA: Diagnosis not present

## 2020-07-13 DIAGNOSIS — I6350 Cerebral infarction due to unspecified occlusion or stenosis of unspecified cerebral artery: Secondary | ICD-10-CM | POA: Diagnosis not present

## 2020-07-13 DIAGNOSIS — R41841 Cognitive communication deficit: Secondary | ICD-10-CM | POA: Diagnosis not present

## 2020-07-13 DIAGNOSIS — F0391 Unspecified dementia with behavioral disturbance: Secondary | ICD-10-CM | POA: Diagnosis not present

## 2020-07-13 DIAGNOSIS — R278 Other lack of coordination: Secondary | ICD-10-CM | POA: Diagnosis not present

## 2020-07-13 DIAGNOSIS — I69354 Hemiplegia and hemiparesis following cerebral infarction affecting left non-dominant side: Secondary | ICD-10-CM | POA: Diagnosis not present

## 2020-07-13 DIAGNOSIS — M6281 Muscle weakness (generalized): Secondary | ICD-10-CM | POA: Diagnosis not present

## 2020-07-14 DIAGNOSIS — I69354 Hemiplegia and hemiparesis following cerebral infarction affecting left non-dominant side: Secondary | ICD-10-CM | POA: Diagnosis not present

## 2020-07-14 DIAGNOSIS — R278 Other lack of coordination: Secondary | ICD-10-CM | POA: Diagnosis not present

## 2020-07-14 DIAGNOSIS — F0391 Unspecified dementia with behavioral disturbance: Secondary | ICD-10-CM | POA: Diagnosis not present

## 2020-07-14 DIAGNOSIS — I6350 Cerebral infarction due to unspecified occlusion or stenosis of unspecified cerebral artery: Secondary | ICD-10-CM | POA: Diagnosis not present

## 2020-07-14 DIAGNOSIS — M6281 Muscle weakness (generalized): Secondary | ICD-10-CM | POA: Diagnosis not present

## 2020-07-14 DIAGNOSIS — R41841 Cognitive communication deficit: Secondary | ICD-10-CM | POA: Diagnosis not present

## 2020-07-15 DIAGNOSIS — M6281 Muscle weakness (generalized): Secondary | ICD-10-CM | POA: Diagnosis not present

## 2020-07-15 DIAGNOSIS — I69354 Hemiplegia and hemiparesis following cerebral infarction affecting left non-dominant side: Secondary | ICD-10-CM | POA: Diagnosis not present

## 2020-07-15 DIAGNOSIS — R41841 Cognitive communication deficit: Secondary | ICD-10-CM | POA: Diagnosis not present

## 2020-07-15 DIAGNOSIS — R278 Other lack of coordination: Secondary | ICD-10-CM | POA: Diagnosis not present

## 2020-07-15 DIAGNOSIS — F0391 Unspecified dementia with behavioral disturbance: Secondary | ICD-10-CM | POA: Diagnosis not present

## 2020-07-15 DIAGNOSIS — I6350 Cerebral infarction due to unspecified occlusion or stenosis of unspecified cerebral artery: Secondary | ICD-10-CM | POA: Diagnosis not present

## 2020-07-16 DIAGNOSIS — R41841 Cognitive communication deficit: Secondary | ICD-10-CM | POA: Diagnosis not present

## 2020-07-16 DIAGNOSIS — M6281 Muscle weakness (generalized): Secondary | ICD-10-CM | POA: Diagnosis not present

## 2020-07-16 DIAGNOSIS — I6350 Cerebral infarction due to unspecified occlusion or stenosis of unspecified cerebral artery: Secondary | ICD-10-CM | POA: Diagnosis not present

## 2020-07-16 DIAGNOSIS — F0391 Unspecified dementia with behavioral disturbance: Secondary | ICD-10-CM | POA: Diagnosis not present

## 2020-07-16 DIAGNOSIS — R278 Other lack of coordination: Secondary | ICD-10-CM | POA: Diagnosis not present

## 2020-07-16 DIAGNOSIS — I69354 Hemiplegia and hemiparesis following cerebral infarction affecting left non-dominant side: Secondary | ICD-10-CM | POA: Diagnosis not present

## 2020-07-17 DIAGNOSIS — I6350 Cerebral infarction due to unspecified occlusion or stenosis of unspecified cerebral artery: Secondary | ICD-10-CM | POA: Diagnosis not present

## 2020-07-17 DIAGNOSIS — R41841 Cognitive communication deficit: Secondary | ICD-10-CM | POA: Diagnosis not present

## 2020-07-17 DIAGNOSIS — F0391 Unspecified dementia with behavioral disturbance: Secondary | ICD-10-CM | POA: Diagnosis not present

## 2020-07-17 DIAGNOSIS — I69354 Hemiplegia and hemiparesis following cerebral infarction affecting left non-dominant side: Secondary | ICD-10-CM | POA: Diagnosis not present

## 2020-07-17 DIAGNOSIS — M6281 Muscle weakness (generalized): Secondary | ICD-10-CM | POA: Diagnosis not present

## 2020-07-17 DIAGNOSIS — R278 Other lack of coordination: Secondary | ICD-10-CM | POA: Diagnosis not present

## 2020-07-20 DIAGNOSIS — I69354 Hemiplegia and hemiparesis following cerebral infarction affecting left non-dominant side: Secondary | ICD-10-CM | POA: Diagnosis not present

## 2020-07-20 DIAGNOSIS — R278 Other lack of coordination: Secondary | ICD-10-CM | POA: Diagnosis not present

## 2020-07-20 DIAGNOSIS — I6350 Cerebral infarction due to unspecified occlusion or stenosis of unspecified cerebral artery: Secondary | ICD-10-CM | POA: Diagnosis not present

## 2020-07-20 DIAGNOSIS — M6281 Muscle weakness (generalized): Secondary | ICD-10-CM | POA: Diagnosis not present

## 2020-07-20 DIAGNOSIS — R41841 Cognitive communication deficit: Secondary | ICD-10-CM | POA: Diagnosis not present

## 2020-07-20 DIAGNOSIS — F0391 Unspecified dementia with behavioral disturbance: Secondary | ICD-10-CM | POA: Diagnosis not present

## 2020-07-21 DIAGNOSIS — M6281 Muscle weakness (generalized): Secondary | ICD-10-CM | POA: Diagnosis not present

## 2020-07-21 DIAGNOSIS — I6350 Cerebral infarction due to unspecified occlusion or stenosis of unspecified cerebral artery: Secondary | ICD-10-CM | POA: Diagnosis not present

## 2020-07-21 DIAGNOSIS — F0391 Unspecified dementia with behavioral disturbance: Secondary | ICD-10-CM | POA: Diagnosis not present

## 2020-07-21 DIAGNOSIS — R278 Other lack of coordination: Secondary | ICD-10-CM | POA: Diagnosis not present

## 2020-07-21 DIAGNOSIS — R41841 Cognitive communication deficit: Secondary | ICD-10-CM | POA: Diagnosis not present

## 2020-07-21 DIAGNOSIS — I69354 Hemiplegia and hemiparesis following cerebral infarction affecting left non-dominant side: Secondary | ICD-10-CM | POA: Diagnosis not present

## 2020-07-22 DIAGNOSIS — M6281 Muscle weakness (generalized): Secondary | ICD-10-CM | POA: Diagnosis not present

## 2020-07-22 DIAGNOSIS — F0391 Unspecified dementia with behavioral disturbance: Secondary | ICD-10-CM | POA: Diagnosis not present

## 2020-07-22 DIAGNOSIS — I69354 Hemiplegia and hemiparesis following cerebral infarction affecting left non-dominant side: Secondary | ICD-10-CM | POA: Diagnosis not present

## 2020-07-22 DIAGNOSIS — R41841 Cognitive communication deficit: Secondary | ICD-10-CM | POA: Diagnosis not present

## 2020-07-22 DIAGNOSIS — I6350 Cerebral infarction due to unspecified occlusion or stenosis of unspecified cerebral artery: Secondary | ICD-10-CM | POA: Diagnosis not present

## 2020-07-22 DIAGNOSIS — R278 Other lack of coordination: Secondary | ICD-10-CM | POA: Diagnosis not present

## 2020-07-23 DIAGNOSIS — R41841 Cognitive communication deficit: Secondary | ICD-10-CM | POA: Diagnosis not present

## 2020-07-23 DIAGNOSIS — R278 Other lack of coordination: Secondary | ICD-10-CM | POA: Diagnosis not present

## 2020-07-23 DIAGNOSIS — I69354 Hemiplegia and hemiparesis following cerebral infarction affecting left non-dominant side: Secondary | ICD-10-CM | POA: Diagnosis not present

## 2020-07-23 DIAGNOSIS — F0391 Unspecified dementia with behavioral disturbance: Secondary | ICD-10-CM | POA: Diagnosis not present

## 2020-07-23 DIAGNOSIS — M6281 Muscle weakness (generalized): Secondary | ICD-10-CM | POA: Diagnosis not present

## 2020-07-23 DIAGNOSIS — I6350 Cerebral infarction due to unspecified occlusion or stenosis of unspecified cerebral artery: Secondary | ICD-10-CM | POA: Diagnosis not present

## 2020-07-24 DIAGNOSIS — R278 Other lack of coordination: Secondary | ICD-10-CM | POA: Diagnosis not present

## 2020-07-24 DIAGNOSIS — R41841 Cognitive communication deficit: Secondary | ICD-10-CM | POA: Diagnosis not present

## 2020-07-24 DIAGNOSIS — I69354 Hemiplegia and hemiparesis following cerebral infarction affecting left non-dominant side: Secondary | ICD-10-CM | POA: Diagnosis not present

## 2020-07-24 DIAGNOSIS — I6350 Cerebral infarction due to unspecified occlusion or stenosis of unspecified cerebral artery: Secondary | ICD-10-CM | POA: Diagnosis not present

## 2020-07-24 DIAGNOSIS — M6281 Muscle weakness (generalized): Secondary | ICD-10-CM | POA: Diagnosis not present

## 2020-07-24 DIAGNOSIS — F0391 Unspecified dementia with behavioral disturbance: Secondary | ICD-10-CM | POA: Diagnosis not present

## 2020-07-27 DIAGNOSIS — R41841 Cognitive communication deficit: Secondary | ICD-10-CM | POA: Diagnosis not present

## 2020-07-27 DIAGNOSIS — I6350 Cerebral infarction due to unspecified occlusion or stenosis of unspecified cerebral artery: Secondary | ICD-10-CM | POA: Diagnosis not present

## 2020-07-27 DIAGNOSIS — M6281 Muscle weakness (generalized): Secondary | ICD-10-CM | POA: Diagnosis not present

## 2020-07-27 DIAGNOSIS — R278 Other lack of coordination: Secondary | ICD-10-CM | POA: Diagnosis not present

## 2020-07-27 DIAGNOSIS — I69354 Hemiplegia and hemiparesis following cerebral infarction affecting left non-dominant side: Secondary | ICD-10-CM | POA: Diagnosis not present

## 2020-07-27 DIAGNOSIS — F0391 Unspecified dementia with behavioral disturbance: Secondary | ICD-10-CM | POA: Diagnosis not present

## 2020-07-29 DIAGNOSIS — M6281 Muscle weakness (generalized): Secondary | ICD-10-CM | POA: Diagnosis not present

## 2020-07-29 DIAGNOSIS — R278 Other lack of coordination: Secondary | ICD-10-CM | POA: Diagnosis not present

## 2020-07-29 DIAGNOSIS — I6350 Cerebral infarction due to unspecified occlusion or stenosis of unspecified cerebral artery: Secondary | ICD-10-CM | POA: Diagnosis not present

## 2020-07-29 DIAGNOSIS — F0391 Unspecified dementia with behavioral disturbance: Secondary | ICD-10-CM | POA: Diagnosis not present

## 2020-07-29 DIAGNOSIS — R41841 Cognitive communication deficit: Secondary | ICD-10-CM | POA: Diagnosis not present

## 2020-07-29 DIAGNOSIS — I69354 Hemiplegia and hemiparesis following cerebral infarction affecting left non-dominant side: Secondary | ICD-10-CM | POA: Diagnosis not present

## 2020-07-30 DIAGNOSIS — R278 Other lack of coordination: Secondary | ICD-10-CM | POA: Diagnosis not present

## 2020-07-30 DIAGNOSIS — I6350 Cerebral infarction due to unspecified occlusion or stenosis of unspecified cerebral artery: Secondary | ICD-10-CM | POA: Diagnosis not present

## 2020-07-30 DIAGNOSIS — I69354 Hemiplegia and hemiparesis following cerebral infarction affecting left non-dominant side: Secondary | ICD-10-CM | POA: Diagnosis not present

## 2020-07-30 DIAGNOSIS — F0391 Unspecified dementia with behavioral disturbance: Secondary | ICD-10-CM | POA: Diagnosis not present

## 2020-07-30 DIAGNOSIS — M6281 Muscle weakness (generalized): Secondary | ICD-10-CM | POA: Diagnosis not present

## 2020-07-30 DIAGNOSIS — R41841 Cognitive communication deficit: Secondary | ICD-10-CM | POA: Diagnosis not present

## 2020-07-31 DIAGNOSIS — R41841 Cognitive communication deficit: Secondary | ICD-10-CM | POA: Diagnosis not present

## 2020-07-31 DIAGNOSIS — M6281 Muscle weakness (generalized): Secondary | ICD-10-CM | POA: Diagnosis not present

## 2020-07-31 DIAGNOSIS — R278 Other lack of coordination: Secondary | ICD-10-CM | POA: Diagnosis not present

## 2020-07-31 DIAGNOSIS — I69354 Hemiplegia and hemiparesis following cerebral infarction affecting left non-dominant side: Secondary | ICD-10-CM | POA: Diagnosis not present

## 2020-07-31 DIAGNOSIS — I6350 Cerebral infarction due to unspecified occlusion or stenosis of unspecified cerebral artery: Secondary | ICD-10-CM | POA: Diagnosis not present

## 2020-07-31 DIAGNOSIS — F0391 Unspecified dementia with behavioral disturbance: Secondary | ICD-10-CM | POA: Diagnosis not present

## 2020-08-04 DIAGNOSIS — I69354 Hemiplegia and hemiparesis following cerebral infarction affecting left non-dominant side: Secondary | ICD-10-CM | POA: Diagnosis not present

## 2020-08-04 DIAGNOSIS — F0391 Unspecified dementia with behavioral disturbance: Secondary | ICD-10-CM | POA: Diagnosis not present

## 2020-08-04 DIAGNOSIS — M6281 Muscle weakness (generalized): Secondary | ICD-10-CM | POA: Diagnosis not present

## 2020-08-04 DIAGNOSIS — R41841 Cognitive communication deficit: Secondary | ICD-10-CM | POA: Diagnosis not present

## 2020-08-04 DIAGNOSIS — I6350 Cerebral infarction due to unspecified occlusion or stenosis of unspecified cerebral artery: Secondary | ICD-10-CM | POA: Diagnosis not present

## 2020-08-04 DIAGNOSIS — R278 Other lack of coordination: Secondary | ICD-10-CM | POA: Diagnosis not present

## 2020-08-05 DIAGNOSIS — R41841 Cognitive communication deficit: Secondary | ICD-10-CM | POA: Diagnosis not present

## 2020-08-05 DIAGNOSIS — F0391 Unspecified dementia with behavioral disturbance: Secondary | ICD-10-CM | POA: Diagnosis not present

## 2020-08-05 DIAGNOSIS — I69354 Hemiplegia and hemiparesis following cerebral infarction affecting left non-dominant side: Secondary | ICD-10-CM | POA: Diagnosis not present

## 2020-08-05 DIAGNOSIS — I6350 Cerebral infarction due to unspecified occlusion or stenosis of unspecified cerebral artery: Secondary | ICD-10-CM | POA: Diagnosis not present

## 2020-08-05 DIAGNOSIS — M6281 Muscle weakness (generalized): Secondary | ICD-10-CM | POA: Diagnosis not present

## 2020-08-05 DIAGNOSIS — R278 Other lack of coordination: Secondary | ICD-10-CM | POA: Diagnosis not present

## 2020-08-06 DIAGNOSIS — M6281 Muscle weakness (generalized): Secondary | ICD-10-CM | POA: Diagnosis not present

## 2020-08-06 DIAGNOSIS — I6350 Cerebral infarction due to unspecified occlusion or stenosis of unspecified cerebral artery: Secondary | ICD-10-CM | POA: Diagnosis not present

## 2020-08-06 DIAGNOSIS — R278 Other lack of coordination: Secondary | ICD-10-CM | POA: Diagnosis not present

## 2020-08-06 DIAGNOSIS — I69354 Hemiplegia and hemiparesis following cerebral infarction affecting left non-dominant side: Secondary | ICD-10-CM | POA: Diagnosis not present

## 2020-08-06 DIAGNOSIS — R41841 Cognitive communication deficit: Secondary | ICD-10-CM | POA: Diagnosis not present

## 2020-08-06 DIAGNOSIS — F0391 Unspecified dementia with behavioral disturbance: Secondary | ICD-10-CM | POA: Diagnosis not present

## 2020-08-07 DIAGNOSIS — M6281 Muscle weakness (generalized): Secondary | ICD-10-CM | POA: Diagnosis not present

## 2020-08-07 DIAGNOSIS — F0391 Unspecified dementia with behavioral disturbance: Secondary | ICD-10-CM | POA: Diagnosis not present

## 2020-08-07 DIAGNOSIS — I69354 Hemiplegia and hemiparesis following cerebral infarction affecting left non-dominant side: Secondary | ICD-10-CM | POA: Diagnosis not present

## 2020-08-07 DIAGNOSIS — R41841 Cognitive communication deficit: Secondary | ICD-10-CM | POA: Diagnosis not present

## 2020-08-07 DIAGNOSIS — R278 Other lack of coordination: Secondary | ICD-10-CM | POA: Diagnosis not present

## 2020-08-07 DIAGNOSIS — I6350 Cerebral infarction due to unspecified occlusion or stenosis of unspecified cerebral artery: Secondary | ICD-10-CM | POA: Diagnosis not present

## 2020-08-10 DIAGNOSIS — F0391 Unspecified dementia with behavioral disturbance: Secondary | ICD-10-CM | POA: Diagnosis not present

## 2020-08-10 DIAGNOSIS — R41841 Cognitive communication deficit: Secondary | ICD-10-CM | POA: Diagnosis not present

## 2020-08-10 DIAGNOSIS — R278 Other lack of coordination: Secondary | ICD-10-CM | POA: Diagnosis not present

## 2020-08-10 DIAGNOSIS — M6281 Muscle weakness (generalized): Secondary | ICD-10-CM | POA: Diagnosis not present

## 2020-08-10 DIAGNOSIS — I6350 Cerebral infarction due to unspecified occlusion or stenosis of unspecified cerebral artery: Secondary | ICD-10-CM | POA: Diagnosis not present

## 2020-08-10 DIAGNOSIS — I69354 Hemiplegia and hemiparesis following cerebral infarction affecting left non-dominant side: Secondary | ICD-10-CM | POA: Diagnosis not present

## 2020-08-11 DIAGNOSIS — L603 Nail dystrophy: Secondary | ICD-10-CM | POA: Diagnosis not present

## 2020-08-11 DIAGNOSIS — I6350 Cerebral infarction due to unspecified occlusion or stenosis of unspecified cerebral artery: Secondary | ICD-10-CM | POA: Diagnosis not present

## 2020-08-11 DIAGNOSIS — M6281 Muscle weakness (generalized): Secondary | ICD-10-CM | POA: Diagnosis not present

## 2020-08-11 DIAGNOSIS — R278 Other lack of coordination: Secondary | ICD-10-CM | POA: Diagnosis not present

## 2020-08-11 DIAGNOSIS — I739 Peripheral vascular disease, unspecified: Secondary | ICD-10-CM | POA: Diagnosis not present

## 2020-08-11 DIAGNOSIS — I69354 Hemiplegia and hemiparesis following cerebral infarction affecting left non-dominant side: Secondary | ICD-10-CM | POA: Diagnosis not present

## 2020-08-11 DIAGNOSIS — B351 Tinea unguium: Secondary | ICD-10-CM | POA: Diagnosis not present

## 2020-08-11 DIAGNOSIS — R41841 Cognitive communication deficit: Secondary | ICD-10-CM | POA: Diagnosis not present

## 2020-08-11 DIAGNOSIS — F0391 Unspecified dementia with behavioral disturbance: Secondary | ICD-10-CM | POA: Diagnosis not present

## 2020-08-11 DIAGNOSIS — I8312 Varicose veins of left lower extremity with inflammation: Secondary | ICD-10-CM | POA: Diagnosis not present

## 2020-08-11 DIAGNOSIS — M79671 Pain in right foot: Secondary | ICD-10-CM | POA: Diagnosis not present

## 2020-08-12 DIAGNOSIS — F0391 Unspecified dementia with behavioral disturbance: Secondary | ICD-10-CM | POA: Diagnosis not present

## 2020-08-12 DIAGNOSIS — R41841 Cognitive communication deficit: Secondary | ICD-10-CM | POA: Diagnosis not present

## 2020-08-12 DIAGNOSIS — I6350 Cerebral infarction due to unspecified occlusion or stenosis of unspecified cerebral artery: Secondary | ICD-10-CM | POA: Diagnosis not present

## 2020-08-12 DIAGNOSIS — I69354 Hemiplegia and hemiparesis following cerebral infarction affecting left non-dominant side: Secondary | ICD-10-CM | POA: Diagnosis not present

## 2020-08-13 DIAGNOSIS — I6350 Cerebral infarction due to unspecified occlusion or stenosis of unspecified cerebral artery: Secondary | ICD-10-CM | POA: Diagnosis not present

## 2020-08-13 DIAGNOSIS — I69354 Hemiplegia and hemiparesis following cerebral infarction affecting left non-dominant side: Secondary | ICD-10-CM | POA: Diagnosis not present

## 2020-08-13 DIAGNOSIS — R41841 Cognitive communication deficit: Secondary | ICD-10-CM | POA: Diagnosis not present

## 2020-08-13 DIAGNOSIS — F0391 Unspecified dementia with behavioral disturbance: Secondary | ICD-10-CM | POA: Diagnosis not present

## 2020-08-14 DIAGNOSIS — F0391 Unspecified dementia with behavioral disturbance: Secondary | ICD-10-CM | POA: Diagnosis not present

## 2020-08-14 DIAGNOSIS — I69354 Hemiplegia and hemiparesis following cerebral infarction affecting left non-dominant side: Secondary | ICD-10-CM | POA: Diagnosis not present

## 2020-08-14 DIAGNOSIS — R41841 Cognitive communication deficit: Secondary | ICD-10-CM | POA: Diagnosis not present

## 2020-08-14 DIAGNOSIS — I6350 Cerebral infarction due to unspecified occlusion or stenosis of unspecified cerebral artery: Secondary | ICD-10-CM | POA: Diagnosis not present

## 2020-08-17 DIAGNOSIS — I69354 Hemiplegia and hemiparesis following cerebral infarction affecting left non-dominant side: Secondary | ICD-10-CM | POA: Diagnosis not present

## 2020-08-17 DIAGNOSIS — R41841 Cognitive communication deficit: Secondary | ICD-10-CM | POA: Diagnosis not present

## 2020-08-17 DIAGNOSIS — F0391 Unspecified dementia with behavioral disturbance: Secondary | ICD-10-CM | POA: Diagnosis not present

## 2020-08-17 DIAGNOSIS — I6350 Cerebral infarction due to unspecified occlusion or stenosis of unspecified cerebral artery: Secondary | ICD-10-CM | POA: Diagnosis not present

## 2020-08-18 DIAGNOSIS — I6350 Cerebral infarction due to unspecified occlusion or stenosis of unspecified cerebral artery: Secondary | ICD-10-CM | POA: Diagnosis not present

## 2020-08-18 DIAGNOSIS — F0391 Unspecified dementia with behavioral disturbance: Secondary | ICD-10-CM | POA: Diagnosis not present

## 2020-08-18 DIAGNOSIS — I69354 Hemiplegia and hemiparesis following cerebral infarction affecting left non-dominant side: Secondary | ICD-10-CM | POA: Diagnosis not present

## 2020-08-18 DIAGNOSIS — R41841 Cognitive communication deficit: Secondary | ICD-10-CM | POA: Diagnosis not present

## 2020-08-31 DIAGNOSIS — F332 Major depressive disorder, recurrent severe without psychotic features: Secondary | ICD-10-CM | POA: Diagnosis not present

## 2020-08-31 DIAGNOSIS — K219 Gastro-esophageal reflux disease without esophagitis: Secondary | ICD-10-CM | POA: Diagnosis not present

## 2020-08-31 DIAGNOSIS — I2510 Atherosclerotic heart disease of native coronary artery without angina pectoris: Secondary | ICD-10-CM | POA: Diagnosis not present

## 2020-08-31 DIAGNOSIS — F411 Generalized anxiety disorder: Secondary | ICD-10-CM | POA: Diagnosis not present

## 2020-08-31 DIAGNOSIS — I10 Essential (primary) hypertension: Secondary | ICD-10-CM | POA: Diagnosis not present

## 2020-08-31 DIAGNOSIS — F0391 Unspecified dementia with behavioral disturbance: Secondary | ICD-10-CM | POA: Diagnosis not present

## 2020-08-31 DIAGNOSIS — I5022 Chronic systolic (congestive) heart failure: Secondary | ICD-10-CM | POA: Diagnosis not present

## 2020-08-31 DIAGNOSIS — I69354 Hemiplegia and hemiparesis following cerebral infarction affecting left non-dominant side: Secondary | ICD-10-CM | POA: Diagnosis not present

## 2020-08-31 DIAGNOSIS — Z9181 History of falling: Secondary | ICD-10-CM | POA: Diagnosis not present

## 2020-09-01 DIAGNOSIS — E119 Type 2 diabetes mellitus without complications: Secondary | ICD-10-CM | POA: Diagnosis not present

## 2020-09-01 DIAGNOSIS — E559 Vitamin D deficiency, unspecified: Secondary | ICD-10-CM | POA: Diagnosis not present

## 2020-09-14 DIAGNOSIS — R278 Other lack of coordination: Secondary | ICD-10-CM | POA: Diagnosis not present

## 2020-09-14 DIAGNOSIS — I69354 Hemiplegia and hemiparesis following cerebral infarction affecting left non-dominant side: Secondary | ICD-10-CM | POA: Diagnosis not present

## 2020-09-14 DIAGNOSIS — M6281 Muscle weakness (generalized): Secondary | ICD-10-CM | POA: Diagnosis not present

## 2020-09-15 DIAGNOSIS — M6281 Muscle weakness (generalized): Secondary | ICD-10-CM | POA: Diagnosis not present

## 2020-09-15 DIAGNOSIS — R278 Other lack of coordination: Secondary | ICD-10-CM | POA: Diagnosis not present

## 2020-09-15 DIAGNOSIS — I69354 Hemiplegia and hemiparesis following cerebral infarction affecting left non-dominant side: Secondary | ICD-10-CM | POA: Diagnosis not present

## 2020-09-16 DIAGNOSIS — R278 Other lack of coordination: Secondary | ICD-10-CM | POA: Diagnosis not present

## 2020-09-16 DIAGNOSIS — I69354 Hemiplegia and hemiparesis following cerebral infarction affecting left non-dominant side: Secondary | ICD-10-CM | POA: Diagnosis not present

## 2020-09-16 DIAGNOSIS — M6281 Muscle weakness (generalized): Secondary | ICD-10-CM | POA: Diagnosis not present

## 2020-09-17 DIAGNOSIS — R278 Other lack of coordination: Secondary | ICD-10-CM | POA: Diagnosis not present

## 2020-09-17 DIAGNOSIS — M6281 Muscle weakness (generalized): Secondary | ICD-10-CM | POA: Diagnosis not present

## 2020-09-17 DIAGNOSIS — I69354 Hemiplegia and hemiparesis following cerebral infarction affecting left non-dominant side: Secondary | ICD-10-CM | POA: Diagnosis not present

## 2020-09-18 ENCOUNTER — Non-Acute Institutional Stay: Payer: Medicare HMO | Admitting: Nurse Practitioner

## 2020-09-18 ENCOUNTER — Other Ambulatory Visit: Payer: Self-pay

## 2020-09-18 ENCOUNTER — Encounter: Payer: Self-pay | Admitting: Nurse Practitioner

## 2020-09-18 VITALS — BP 121/72 | HR 77 | Temp 97.4°F | Wt 107.9 lb

## 2020-09-18 DIAGNOSIS — I639 Cerebral infarction, unspecified: Secondary | ICD-10-CM

## 2020-09-18 DIAGNOSIS — M6281 Muscle weakness (generalized): Secondary | ICD-10-CM | POA: Diagnosis not present

## 2020-09-18 DIAGNOSIS — I69354 Hemiplegia and hemiparesis following cerebral infarction affecting left non-dominant side: Secondary | ICD-10-CM | POA: Diagnosis not present

## 2020-09-18 DIAGNOSIS — Z515 Encounter for palliative care: Secondary | ICD-10-CM

## 2020-09-18 DIAGNOSIS — R278 Other lack of coordination: Secondary | ICD-10-CM | POA: Diagnosis not present

## 2020-09-18 NOTE — Progress Notes (Signed)
Therapist, nutritional Palliative Care Consult Note Telephone: [phone]  Fax: [phone]  PATIENT NAME: Erica Stevens DOB: 1951-08-08 MRN: [id number]  PRIMARY CARE PROVIDER:   [facility] RESPONSIBLE PARTY:Erica Stevens daughter [phone], son Erica Stevens [phone]  RECOMMENDATIONS and PLAN: 1.ACP: Full code, ongoing discussion goc  2.Generalized weaknesssecondary toStroke/CHF/CADcontinue with therapy as able. Encourage energy conservation and rest times.  3.Palliative care encounter; Palliative medicine team will continue to support patient, patient's family, and medical team. Visit consisted of counseling and education dealing with the complex and emotionally intense issues of symptom management and palliative care in the setting of serious and potentially life-threatening illness  4. Follow-up visit 8 weeks or sooner if declines for ongoing monitoring weights, debility  I spent 40 minutes providing this consultation, starting at 12:00pm. More than 50% of the time in this consultation was spent coordinating communication.   HISTORY OF PRESENT ILLNESS:  Erica Stevens is a 70 y.o. year old female with multiple medical problems including Coronary artery disease s/p cabg, chronic systolic congestive heart failure, stroke, hyperlipidemia, dementia, allergies, anxiety, chronic kidney disease, chronic pain syndrome, degeneration of cervical intervertebral disc, hypertension, history of migraine, mitral valve disorder, osteoporosis, hypercholesterolemia, reflux esophagitis, thoracic lumbar neuritis, historyofurinary incontinence, depression. Erica Stevens continues to reside at [facility]. Erica Stevens is total ADL dependent for transferring, mobility, bathing, dressing, toileting as she is incontinent. Erica Stevens does feed herself an appetite has been fair with current  weight 107.9 lb. BMI 21.8, 4.2 lb weight loss over the last four months. Erica Stevens is on a regular diet regular texture regular liquid consistency with Med Plus supplement. Care plan meeting held 3 / 23 / 2021, Erica Stevens daughter HCPOA invited but did not hear back from her. No changes in this plan of care. No recent wounds, falls, infections, hospitalizations. Erica Stevens is followed by Podiatry at the facility. Erica Stevens is full code. I present Erica Stevens is lying in bed. Erica Stevens appears comfortable, no visitors present. I visited and observed Erica Stevens. We talked about purpose of Palliative care visit. Erica Stevens said "okay". Erica Stevens had a stuffed animal bird in her hand. Asked about the bird. Erica Stevens endorses that she had a bird previously that looked like the stuffed animal whose name was Erica Stevens. Erica Stevens talked at length about angel her bird. Erica Stevens endorse if she got the bird as a baby, it did not talk. It passed away after several years. Erica Stevens was labile with her emotions tearful at times, then smiling. We talked about the foods that she likes and her appetite. Erica Stevens was not able to recall what she had to eat for breakfast. We talked about her daily routine. We talked about getting out of bed. Ms. Reaume endorses that she would like to get out of bed. Will update staff. We talked about quality of life. Medical goals reviewed. We talked about things that Ms. Espey had on her bedside table. We talked about role of Palliative care and plan of care. Palliative care visit supportive. No new changes recommended at this time. I have attempted to contact her daughter, message left for update on Palliative care visit. I updated staff.  Palliative Care was asked to help address goals of care.   CODE STATUS: Full code  PPS: 30% HOSPICE ELIGIBILITY/DIAGNOSIS: TBD  PAST MEDICAL HISTORY:  Past Medical History:  Diagnosis Date  .  Allergic rhinitis due to pollen   . Anxiety   .  Brachial neuritis or radiculitis NOS   . Cellulitis and abscess of unspecified site   . Chronic kidney disease, stage I   . Chronic pain syndrome   . Congestive heart failure, unspecified   . Coronary atherosclerosis of unspecified type of vessel, native or graft   . Degeneration of cervical intervertebral disc   . Depression   . Hypertension   . Migraine, unspecified, without mention of intractable migraine without mention of status migrainosus   . 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 Osteoporosis, unspecified   . Pure hypercholesterolemia   . Reflux esophagitis   . Thoracic or lumbosacral neuritis or radiculitis, unspecified   . Unspecified urinary incontinence     SOCIAL HX:  Social History   Tobacco Use  . Smoking status: Current Every Day Smoker    Packs/day: 1.50    Types: Cigarettes  . Smokeless tobacco: Never Used  Substance Use Topics  . Alcohol use: No    ALLERGIES:  Allergies  Allergen Reactions  . Erythromycin     Other reaction(s): Unknown  . Ivp Dye [Iodinated Diagnostic Agents]   . Sulfa Antibiotics     Stated turns orange  . Sulfur      PHYSICAL EXAM:   General: NAD, debilitated, pleasant female Cardiovascular: regular rate and rhythm Pulmonary: clear ant fields Neurological: functional quadriplegic  Jamin Humphries Prince [place], NP

## 2020-09-21 DIAGNOSIS — R278 Other lack of coordination: Secondary | ICD-10-CM | POA: Diagnosis not present

## 2020-09-21 DIAGNOSIS — M6281 Muscle weakness (generalized): Secondary | ICD-10-CM | POA: Diagnosis not present

## 2020-09-21 DIAGNOSIS — I69354 Hemiplegia and hemiparesis following cerebral infarction affecting left non-dominant side: Secondary | ICD-10-CM | POA: Diagnosis not present

## 2020-09-22 DIAGNOSIS — M6281 Muscle weakness (generalized): Secondary | ICD-10-CM | POA: Diagnosis not present

## 2020-09-22 DIAGNOSIS — R278 Other lack of coordination: Secondary | ICD-10-CM | POA: Diagnosis not present

## 2020-09-22 DIAGNOSIS — I69354 Hemiplegia and hemiparesis following cerebral infarction affecting left non-dominant side: Secondary | ICD-10-CM | POA: Diagnosis not present

## 2020-09-23 DIAGNOSIS — R278 Other lack of coordination: Secondary | ICD-10-CM | POA: Diagnosis not present

## 2020-09-23 DIAGNOSIS — M6281 Muscle weakness (generalized): Secondary | ICD-10-CM | POA: Diagnosis not present

## 2020-09-23 DIAGNOSIS — I69354 Hemiplegia and hemiparesis following cerebral infarction affecting left non-dominant side: Secondary | ICD-10-CM | POA: Diagnosis not present

## 2020-09-24 DIAGNOSIS — R278 Other lack of coordination: Secondary | ICD-10-CM | POA: Diagnosis not present

## 2020-09-24 DIAGNOSIS — I69354 Hemiplegia and hemiparesis following cerebral infarction affecting left non-dominant side: Secondary | ICD-10-CM | POA: Diagnosis not present

## 2020-09-24 DIAGNOSIS — M6281 Muscle weakness (generalized): Secondary | ICD-10-CM | POA: Diagnosis not present

## 2020-09-25 DIAGNOSIS — I69354 Hemiplegia and hemiparesis following cerebral infarction affecting left non-dominant side: Secondary | ICD-10-CM | POA: Diagnosis not present

## 2020-09-25 DIAGNOSIS — M6281 Muscle weakness (generalized): Secondary | ICD-10-CM | POA: Diagnosis not present

## 2020-09-25 DIAGNOSIS — R278 Other lack of coordination: Secondary | ICD-10-CM | POA: Diagnosis not present

## 2020-09-28 DIAGNOSIS — I69354 Hemiplegia and hemiparesis following cerebral infarction affecting left non-dominant side: Secondary | ICD-10-CM | POA: Diagnosis not present

## 2020-09-28 DIAGNOSIS — R278 Other lack of coordination: Secondary | ICD-10-CM | POA: Diagnosis not present

## 2020-09-28 DIAGNOSIS — M6281 Muscle weakness (generalized): Secondary | ICD-10-CM | POA: Diagnosis not present

## 2020-09-29 DIAGNOSIS — I69354 Hemiplegia and hemiparesis following cerebral infarction affecting left non-dominant side: Secondary | ICD-10-CM | POA: Diagnosis not present

## 2020-09-29 DIAGNOSIS — R278 Other lack of coordination: Secondary | ICD-10-CM | POA: Diagnosis not present

## 2020-09-29 DIAGNOSIS — M6281 Muscle weakness (generalized): Secondary | ICD-10-CM | POA: Diagnosis not present

## 2020-09-30 DIAGNOSIS — M6281 Muscle weakness (generalized): Secondary | ICD-10-CM | POA: Diagnosis not present

## 2020-09-30 DIAGNOSIS — I69354 Hemiplegia and hemiparesis following cerebral infarction affecting left non-dominant side: Secondary | ICD-10-CM | POA: Diagnosis not present

## 2020-09-30 DIAGNOSIS — R278 Other lack of coordination: Secondary | ICD-10-CM | POA: Diagnosis not present

## 2020-10-01 DIAGNOSIS — M6281 Muscle weakness (generalized): Secondary | ICD-10-CM | POA: Diagnosis not present

## 2020-10-01 DIAGNOSIS — I69354 Hemiplegia and hemiparesis following cerebral infarction affecting left non-dominant side: Secondary | ICD-10-CM | POA: Diagnosis not present

## 2020-10-01 DIAGNOSIS — R278 Other lack of coordination: Secondary | ICD-10-CM | POA: Diagnosis not present

## 2020-10-02 DIAGNOSIS — R278 Other lack of coordination: Secondary | ICD-10-CM | POA: Diagnosis not present

## 2020-10-02 DIAGNOSIS — M6281 Muscle weakness (generalized): Secondary | ICD-10-CM | POA: Diagnosis not present

## 2020-10-02 DIAGNOSIS — I69354 Hemiplegia and hemiparesis following cerebral infarction affecting left non-dominant side: Secondary | ICD-10-CM | POA: Diagnosis not present

## 2020-10-05 DIAGNOSIS — I69354 Hemiplegia and hemiparesis following cerebral infarction affecting left non-dominant side: Secondary | ICD-10-CM | POA: Diagnosis not present

## 2020-10-05 DIAGNOSIS — R278 Other lack of coordination: Secondary | ICD-10-CM | POA: Diagnosis not present

## 2020-10-05 DIAGNOSIS — M6281 Muscle weakness (generalized): Secondary | ICD-10-CM | POA: Diagnosis not present

## 2020-10-06 DIAGNOSIS — I69354 Hemiplegia and hemiparesis following cerebral infarction affecting left non-dominant side: Secondary | ICD-10-CM | POA: Diagnosis not present

## 2020-10-06 DIAGNOSIS — R278 Other lack of coordination: Secondary | ICD-10-CM | POA: Diagnosis not present

## 2020-10-06 DIAGNOSIS — M6281 Muscle weakness (generalized): Secondary | ICD-10-CM | POA: Diagnosis not present

## 2020-10-07 DIAGNOSIS — I69354 Hemiplegia and hemiparesis following cerebral infarction affecting left non-dominant side: Secondary | ICD-10-CM | POA: Diagnosis not present

## 2020-10-07 DIAGNOSIS — G4700 Insomnia, unspecified: Secondary | ICD-10-CM | POA: Diagnosis not present

## 2020-10-07 DIAGNOSIS — F0391 Unspecified dementia with behavioral disturbance: Secondary | ICD-10-CM | POA: Diagnosis not present

## 2020-10-07 DIAGNOSIS — M6281 Muscle weakness (generalized): Secondary | ICD-10-CM | POA: Diagnosis not present

## 2020-10-07 DIAGNOSIS — R278 Other lack of coordination: Secondary | ICD-10-CM | POA: Diagnosis not present

## 2020-10-08 DIAGNOSIS — I69354 Hemiplegia and hemiparesis following cerebral infarction affecting left non-dominant side: Secondary | ICD-10-CM | POA: Diagnosis not present

## 2020-10-08 DIAGNOSIS — M6281 Muscle weakness (generalized): Secondary | ICD-10-CM | POA: Diagnosis not present

## 2020-10-08 DIAGNOSIS — R278 Other lack of coordination: Secondary | ICD-10-CM | POA: Diagnosis not present

## 2020-10-09 DIAGNOSIS — M6281 Muscle weakness (generalized): Secondary | ICD-10-CM | POA: Diagnosis not present

## 2020-10-09 DIAGNOSIS — I69354 Hemiplegia and hemiparesis following cerebral infarction affecting left non-dominant side: Secondary | ICD-10-CM | POA: Diagnosis not present

## 2020-10-09 DIAGNOSIS — R278 Other lack of coordination: Secondary | ICD-10-CM | POA: Diagnosis not present

## 2020-10-12 DIAGNOSIS — R278 Other lack of coordination: Secondary | ICD-10-CM | POA: Diagnosis not present

## 2020-10-12 DIAGNOSIS — M6281 Muscle weakness (generalized): Secondary | ICD-10-CM | POA: Diagnosis not present

## 2020-10-12 DIAGNOSIS — I69354 Hemiplegia and hemiparesis following cerebral infarction affecting left non-dominant side: Secondary | ICD-10-CM | POA: Diagnosis not present

## 2020-11-09 ENCOUNTER — Encounter: Payer: Self-pay | Admitting: Nurse Practitioner

## 2020-11-09 ENCOUNTER — Non-Acute Institutional Stay: Payer: Medicare HMO | Admitting: Nurse Practitioner

## 2020-11-09 DIAGNOSIS — I639 Cerebral infarction, unspecified: Secondary | ICD-10-CM

## 2020-11-09 DIAGNOSIS — Z515 Encounter for palliative care: Secondary | ICD-10-CM | POA: Diagnosis not present

## 2020-11-09 NOTE — Progress Notes (Signed)
Therapist, nutritional Palliative Care Consult Note Telephone: [phone]  Fax: [phone]  PATIENT NAME: Madalee Altmann DOB: 1951-01-20 MRN: [id number]   PRIMARY CARE PROVIDER:   [facility] RESPONSIBLE PARTY:[name] daughter [phone], son Brande Uncapher [phone]  RECOMMENDATIONS and PLAN: 1.ACP: Full code,ongoing discussion goc  2.Generalized weaknesssecondary toStroke/CHF/CADcontinue with therapy as able. Encourage energy conservation and rest times.  3.Palliative care encounter; Palliative medicine team will continue to support patient, patient's family, and medical team. Visit consisted of counseling and education dealing with the complex and emotionally intense issues of symptom management and palliative care in the setting of serious and potentially life-threatening illness  4. Follow-up visit 8 weeks or sooner if declines for ongoing monitoring weights, debility  I spent 40 minutes providing this consultation, starting at 1:00pm. More than 50% of the time in this consultation was spent coordinating communication.   HISTORY OF PRESENT ILLNESS:  Livana Yerian is a 70 y.o. year old female with multiple medical problems including Coronary artery disease s/p cabg, chronic systolic congestive heart failure, stroke, hyperlipidemia, dementia, allergies, anxiety, chronic kidney disease, chronic pain syndrome, degeneration of cervical intervertebral disc, hypertension, history of migraine, mitral valve disorder, osteoporosis, hypercholesterolemia, reflux esophagitis, thoracic lumbar neuritis, historyofurinary incontinence, depression. Ms Hazan continues to reside in [facility]. Ms Rundell does remain bed-bound, total ADL dependents for bathing, dressing, toileting as she is in comment. Ms Bassette does require assistance with feeding. Current weight 104.5 lbs  with 7.3lb weight loss x months. BMI 21.1. Ms. Remmert is on a regular diet, regular texture, regular liquid consistency with med plus supplement. 2 / 10 / 2022 care plan meeting held, no changes at this time. Ms Haskins does refuse med plus supplement intermittently per staff. No recent wounds, falls, hospitalizations, infections. Staff endorses no new changes are concerned. At times Ms vylet maffia her needs other time staff endorsed if she is quiet. At present Ms Bawa is lying in bed. Ms Boyden appears comfortable though debilitated. Ms Holian and I talked about purpose of Palliative care visit. Ms Solana made eye contact was interactive, answering questions. We talked about symptoms of pain and shortness of breath what she denies. We talked about Ms Dascenzo appetite. Ms Echeverria endorses she is eating fine. We talked about her weight loss. We talked about getting out of bed. Ms Newson endorsed if she prefers to stay in bed. Limited verbal discussion as Ms Guttman mild cognitive impairment. We talked about activities that she likes to do. Ms Brune endorses that she said that is read. We talked about Ms Mcquigg daughter and her last visit. Ms Loth continues to get confused with time. Ms Julio was cooperative with the assessment. Medical goals are reviewed. Ms Yoon requested that staff reposition her and updated nursing. I have attempted to contact Ms Umland daughter for update on Palliative care visit, ongoing discussion of code status, of complex medical decision-making, chronic disease progression, monitoring and symptoms.  Palliative Care was asked to help to continue to address goals of care.   CODE STATUS: full code  PPS: 30% HOSPICE ELIGIBILITY/DIAGNOSIS: TBD  PAST MEDICAL HISTORY:  Past Medical History:  Diagnosis Date  . Allergic rhinitis due to pollen   . Anxiety   . Brachial neuritis or radiculitis NOS   . Cellulitis and abscess of unspecified site   . Chronic kidney disease,  stage I   . Chronic pain syndrome   .  Congestive heart failure, unspecified   . Coronary atherosclerosis of unspecified type of vessel, native or graft   . Degeneration of cervical intervertebral disc   . Depression   . Hypertension   . Migraine, unspecified, without mention of intractable migraine without mention of status migrainosus   . 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 Osteoporosis, unspecified   . Pure hypercholesterolemia   . Reflux esophagitis   . Thoracic or lumbosacral neuritis or radiculitis, unspecified   . Unspecified urinary incontinence     SOCIAL HX:  Social History   Tobacco Use  . Smoking status: Current Every Day Smoker    Packs/day: 1.50    Types: Cigarettes  . Smokeless tobacco: Never Used  Substance Use Topics  . Alcohol use: No    ALLERGIES:  Allergies  Allergen Reactions  . Elemental Sulfur   . Erythromycin     Other reaction(s): Unknown  . Ivp Dye [Iodinated Diagnostic Agents]   . Sulfa Antibiotics     Stated turns orange      PHYSICAL EXAM:   General: NAD, debilitated, pleasant female Cardiovascular: regular rate and rhythm Pulmonary: clear ant fields Abdomen: soft, nontender, + bowel sounds Neurological: functional quadriplegic  Eunice Oldaker Prince [place], NP

## 2020-11-10 ENCOUNTER — Other Ambulatory Visit: Payer: Self-pay

## 2020-12-09 DIAGNOSIS — I69391 Dysphagia following cerebral infarction: Secondary | ICD-10-CM | POA: Diagnosis not present

## 2020-12-09 DIAGNOSIS — I10 Essential (primary) hypertension: Secondary | ICD-10-CM | POA: Diagnosis not present

## 2020-12-09 DIAGNOSIS — I2510 Atherosclerotic heart disease of native coronary artery without angina pectoris: Secondary | ICD-10-CM | POA: Diagnosis not present

## 2020-12-09 DIAGNOSIS — I69354 Hemiplegia and hemiparesis following cerebral infarction affecting left non-dominant side: Secondary | ICD-10-CM | POA: Diagnosis not present

## 2020-12-09 DIAGNOSIS — F0391 Unspecified dementia with behavioral disturbance: Secondary | ICD-10-CM | POA: Diagnosis not present

## 2020-12-09 DIAGNOSIS — I6930 Unspecified sequelae of cerebral infarction: Secondary | ICD-10-CM | POA: Diagnosis not present

## 2020-12-09 DIAGNOSIS — I5022 Chronic systolic (congestive) heart failure: Secondary | ICD-10-CM | POA: Diagnosis not present

## 2020-12-09 DIAGNOSIS — F419 Anxiety disorder, unspecified: Secondary | ICD-10-CM | POA: Diagnosis not present

## 2020-12-09 DIAGNOSIS — M6281 Muscle weakness (generalized): Secondary | ICD-10-CM | POA: Diagnosis not present

## 2020-12-10 DIAGNOSIS — E119 Type 2 diabetes mellitus without complications: Secondary | ICD-10-CM | POA: Diagnosis not present

## 2020-12-24 ENCOUNTER — Encounter: Payer: Self-pay | Admitting: Nurse Practitioner

## 2020-12-24 ENCOUNTER — Non-Acute Institutional Stay: Payer: Medicare HMO | Admitting: Nurse Practitioner

## 2020-12-24 ENCOUNTER — Other Ambulatory Visit: Payer: Self-pay

## 2020-12-24 VITALS — BP 120/74 | HR 78 | Temp 98.0°F | Resp 18 | Wt 101.0 lb

## 2020-12-24 DIAGNOSIS — R630 Anorexia: Secondary | ICD-10-CM

## 2020-12-24 DIAGNOSIS — Z515 Encounter for palliative care: Secondary | ICD-10-CM | POA: Diagnosis not present

## 2020-12-24 DIAGNOSIS — R5381 Other malaise: Secondary | ICD-10-CM | POA: Diagnosis not present

## 2020-12-24 DIAGNOSIS — F0390 Unspecified dementia without behavioral disturbance: Secondary | ICD-10-CM | POA: Diagnosis not present

## 2020-12-24 NOTE — Progress Notes (Addendum)
[employer] Palliative Care Consult Note Telephone: [phone]  Fax: [phone]    Date of encounter: 12/24/20 PATIENT NAME: Erica Stevens [id number]   [phone] (home)  DOB: [birth year] MRN: [id number] PRIMARY CARE PROVIDER:   Dr [facility] Erica Sizer, MD,  [address] [place] [address] [phone]  RESPONSIBLE PARTY:    Contact Information    Name Relation Home Work Mobile   Erica Stevens Daughter [phone]     Erica Stevens Mother [phone]       I met face to face with patient acility. Palliative Care was asked to follow this patient by consultation request of Dr Erica Stevens to address advance care planning and complex medical decision making. This is a follow up visit  ASSESSMENT AND PLAN / RECOMMENDATIONS:   Advance Care Planning/Goals of Care: Goals include to maximize quality of life and symptom management. Our advance care planning conversation included a discussion about:     The value and importance of advance care planning   Experiences with loved ones who have been seriously ill or have died   Exploration of personal, cultural or spiritual beliefs that might influence medical decisions   Exploration of goals of care in the event of a sudden injury or illness   Identification and preparation of a healthcare agent   Review and updating or creation of an  advance directive document .  Decision not to resuscitate or to de-escalate disease focused treatments due to poor prognosis.  CODE STATUS: Full code  Symptom Management/Plan: 1.ACP: Full code,ongoing discussion goc  2.Palliative care encounter; Palliative medicine team will continue to support patient, patient's family, and medical team. Visit consisted of counseling and education dealing with the complex and emotionally intense issues of symptom management and palliative care in the  setting of serious and potentially life-threatening illness  3. Anorexia; ongoing weight loss; continue to monitor appetite, weights, supplements, recommended to start remeron 7.42m qhs, discussed with primary and will proceed, daughter CMorey Hummingbirdin agreement as she has lost 18lbs in 3 months with 15% loss  4. Debility secondary to cva/dementia; continue with encourage to get oob though continues to decline; passive rom; encourage family to visit  Follow up Palliative Care Visit: Palliative care will continue to follow for complex medical decision making, advance care planning, and clarification of goals. Return 4 weeks or prn.  I spent 40 minutes providing this consultation starting at 1pm. More than 50% of the time in this consultation was spent in counseling and care coordination.  PPS: 30%  HOSPICE ELIGIBILITY/DIAGNOSIS: TBD  Chief Complaint: decrease appetite/weight loss  HISTORY OF PRESENT ILLNESS:  SElva Maurois a 70y.o. year old female with Coronary artery disease s/p cabg, chronic systolic congestive heart failure, stroke, hyperlipidemia, dementia, allergies, anxiety, chronic kidney disease, chronic pain syndrome, degeneration of cervical intervertebral disc, hypertension, history of migraine, mitral valve disorder, osteoporosis, hypercholesterolemia, reflux esophagitis, thoracic lumbar neuritis, historyofurinary incontinence, depression. Erica [facility] Erica WBuseydoes remain bed-bound, total ADL dependents for bathing, dressing, toileting as she is in comment. Erica WAmmondoes require assistance with feeding. Erica. WYardehas lost 18lbs in 3 months with 15% loss. Staff endorses no other changes or concerns, No recent falls, wounds, hospitalizations, infections. I visited and observed [facility] Stevens [facility] We talked about purpose of  PC, Erica Stevens in agreement to a  visit. We talked about symptoms of pain, shortness of breath which Erica Stevens denies. We talked about debility, mobility, getting out of bed which Erica Stevens declines to do. We talked about appetite being poor, weight loss, nutrition, foods she likes. We talked about things she likes to do during the day, routine while she mostly sleeps. She will look at a book or magazine. We talked about residing at family. We talked about her daughter Erica Stevens visiting. Limited verbal discussion with mild cognitive impairment, Erica Stevens was cooperative with assessment. Emotional support provided. I called Erica Hummingbird, Erica Stevens's daughter with clinical updated. We talked about PC visit with Erica Stevens. We talked about discussion including weight loss. We talked about starting remeron and [name] in agreement. Erica Stevens endorses she has been sick herself for last 2 months which has limited her visiting Erica Stevens. Erica Stevens endorses she believes that may have something to do with weight loss. Erica Stevens endorses she will try to visit more frequently. Erica Stevens endorses Erica Stevens likes coffee flavors and wondered if there was a coffee flavored supplement. Erica Stevens was going to look in the store, recommended asking dietician also. We talked about challenges residing in facility, encouraged mobility. Erica Stevens and I talked about Erica Stevens declining to get OOB. We reviewed medical goals of care, no recent exacerbations of chf, dementia progressive. We talked about f/u pc visit in 1 month for ongoing monitoring weights, disease progression. Will see if increase in visiting from daughter and remeron increases weight, appetite, if not will consider discussions of hospice with decline, weight loss 18%. I update staff  12/10/2020 WBC 7.2, hemoglobin 10.6, hematocrit 31.4, platelets 181, sodium 144, potassium 4.3, chloride 108, Co2 25, calcium 8.7, bun 10.2, creatinine 0.72, glucose 75, albumun 3.3, total protein 5.6, alt 17, ast 25  History obtained from  review of EMR, discussion with primary team, and interview with family, facility staff and Erica Stevens.  I reviewed available labs, medications, imaging, studies and related documents from the EMR.  Records reviewed and summarized above.   ROS  Full 14 system review of systems performed and negative with exception of: as per HPI.  Physical Exam: Constitutional: NAD General: frail appearing, thin, pleasant female EYES: lids intact, no discharge  ENMT: oral mucous membranes moist,  CV: S1S2, RRR, no LE edema Pulmonary: LCTA, no increased work of breathing, no cough, room air Abdomen: intake 25-50%, normo-active BS + 4 quadrants, soft and non tender, GU: deferred MSK: no sarcopenia, moves all extremities, bed-bound Skin: warm and dry, no rashes or wounds on visible skin Neuro:  no generalized weakness,  mild cognitive impairment Psych: non-anxious affect, A and Oriented to self, place  Questions and concerns were addressed. The patient/family was encouraged to call with questions and/or concerns.  Provided general support and encouragement, no other unmet needs identified  Thank you for the opportunity to participate in the care of Erica. Longo.  The palliative care team will continue to follow. Please call our office at [phone] if we can be of additional assistance.   This chart was dictated using voice recognition software. Despite best efforts to proofread, errors can occur which can change the documentation meaning.  Shraga Custard Ihor Gully, NP , MSN, [facility]

## 2021-01-25 ENCOUNTER — Encounter: Payer: Self-pay | Admitting: Nurse Practitioner

## 2021-01-25 ENCOUNTER — Non-Acute Institutional Stay: Payer: Medicare HMO | Admitting: Nurse Practitioner

## 2021-01-25 VITALS — BP 110/77 | HR 74 | Temp 98.7°F | Resp 18 | Wt 103.0 lb

## 2021-01-25 DIAGNOSIS — R630 Anorexia: Secondary | ICD-10-CM

## 2021-01-25 DIAGNOSIS — Z515 Encounter for palliative care: Secondary | ICD-10-CM

## 2021-01-25 DIAGNOSIS — R5381 Other malaise: Secondary | ICD-10-CM

## 2021-01-25 NOTE — Progress Notes (Signed)
[employer] Palliative Care Consult Note Telephone: [phone]  Fax: [phone]    Date of encounter: 01/25/21 PATIENT NAME: Erica Stevens [place] [address]   [phone] (home)  DOB: September 09, 1951 MRN: [id number] PRIMARY CARE PROVIDER:   Dr [facility] RESPONSIBLE PARTY:    Contact Information    Name Relation Home Work Mobile   Erica Stevens Daughter [phone]     Erica Stevens Mother [phone]       I met face to face with patient facility. Palliative Care was asked to follow this patient by consultation request of  Dr Reesa Chew to address advance care planning and complex medical decision making. This is a follow up visit.  ASSESSMENT AND PLAN / RECOMMENDATIONS:   Symptom Management/Plan: 1.ACP: Full code,ongoing discussion goc  2.Palliative care encounter; Palliative medicine team will continue to support patient, patient's family, and medical team. Visit consisted of counseling and education dealing with the complex and emotionally intense issues of symptom management and palliative care in the setting of serious and potentially life-threatening illness  3. Anorexia; improved with 2lb weight gain with BMI 20.8. Continue supplements, monitoring weights. Encourage to eat  4. Debility secondary to cva/dementia; continue with encourage to get oob though continues to decline; passive rom; encourage family to visit  Follow up Palliative Care Visit: Palliative care will continue to follow for complex medical decision making, advance care planning, and clarification of goals. Return 2 moths or prn.  I spent 35 minutes providing this consultation. More than 50% of the time in this consultation was spent in counseling and care coordination.  PPS: 30%  HOSPICE ELIGIBILITY/DIAGNOSIS: TBD  Chief Complaint: Follow up Palliative consult for complex medical decision making  HISTORY OF PRESENT  ILLNESS:  Erica Stevens is a 70 y.o. year old female  with Coronary artery disease s/p cabg, chronic systolic congestive heart failure, stroke, hyperlipidemia, dementia, allergies, anxiety, chronic kidney disease, chronic pain syndrome, degeneration of cervical intervertebral disc, hypertension, history of migraine, mitral valve disorder, osteoporosis, hypercholesterolemia, reflux esophagitis, thoracic lumbar neuritis, historyofurinary incontinence, depression.Ms Erica Stevens continues to [place] at [facility]. Ms Erica Stevens does remain bed-bound, total ADL dependents for bathing, dressing, toileting as she is in comment. Ms Erica Stevens does require assistance with feeding. Ms. Erica Stevens has gained 2 lbs. No recent falls, wounds, hospitalizations, infections. Staff endorses no other changes or concerns, I visited and observed Ms. Erica Stevens. Ms. Erica Stevens was lying in her bed in her room. Ms. Erica Stevens appears debilitated, comfortable. No visitors present. Ms. Erica Stevens does make eye contact to verbal cues. Ms. Erica Stevens does say one word to answer simple questions. Limited discussion with Ms. Erica Stevens with cognitive impairment. Most of PC visit supportive, encourage Ms. Erica Stevens to get OOB, encouraged Ms. Erica Stevens to eat. Ms. Erica Stevens currently eating fritos with a chocolate bar. Emotional support provided. I have attempted to contact Erica Hummingbird, Ms. Erica Stevens daughter for further discussion of complex medical goals of care, revisit code status, ongoing monitoring weights, supportive role with chronic disease management. Medical goals reviewed. Updated staff, no changes at present time.     History obtained from review of EMR, discussion with primary team, and interview facility staff and  Ms. Erica Stevens.  I reviewed available labs, medications, imaging, studies and related documents from the EMR.  Records reviewed and summarized above.   ROS Full 14 system review of systems performed and  negative with exception of: as per HPI.  Physical Exam:  Constitutional: NAD General: frail appearing, thin, cognitively impaired bed-bound debilitated female EYES:  lids intact ENMT: oral mucous membranes moist CV: S1S2, RRR, no LE edema Pulmonary: LCTA, no increased work of breathing, no cough, room air Abdomen: normo-active BS + 4 quadrants, soft and non tender GU: deferred MSK: bed-bound Skin: warm and dry Neuro:  + generalized weakness,  + cognitive impairment Psych: flat affect, A and O x 2  Questions and concerns were addressed. The patient/staff was encouraged to call with questions and/or concerns. Provided general support and encouragement, no other unmet needs identified  Thank you for the opportunity to participate in the care of Ms. Erica Stevens.  The palliative care team will continue to follow. Please call our office at [phone] if we can be of additional assistance.  This chart was dictated using voice recognition software. Despite best efforts to proofread, errors can occur which can change the documentation meaning.   Lamarion Mcevers Ihor Gully, NP , MSN, [facility]

## 2021-01-26 ENCOUNTER — Other Ambulatory Visit: Payer: Self-pay

## 2021-03-22 ENCOUNTER — Other Ambulatory Visit: Payer: Self-pay

## 2021-03-22 ENCOUNTER — Encounter: Payer: Self-pay | Admitting: Nurse Practitioner

## 2021-03-22 ENCOUNTER — Non-Acute Institutional Stay: Payer: Medicare Other | Admitting: Nurse Practitioner

## 2021-03-22 VITALS — BP 90/57 | HR 77 | Temp 98.0°F | Resp 18 | Wt 103.0 lb

## 2021-03-22 DIAGNOSIS — Z515 Encounter for palliative care: Secondary | ICD-10-CM

## 2021-03-22 DIAGNOSIS — R630 Anorexia: Secondary | ICD-10-CM

## 2021-03-22 DIAGNOSIS — R5381 Other malaise: Secondary | ICD-10-CM

## 2021-03-22 NOTE — Progress Notes (Signed)
[employer] Palliative Care Consult Note Telephone: [phone]  Fax: [phone]    Date of encounter: 03/22/21 PATIENT NAME: Erica Stevens [place] [address]   [phone] (home)  DOB: Sep 26, 1950 MRN: [id number] PRIMARY CARE PROVIDER:   Dr Lucianne Lei [facility] RESPONSIBLE PARTY:    Contact Information     Name Relation Home Work Mobile   Erica Stevens,Erica Stevens Daughter [phone]     Erica Stevens Mother [phone]        I met face to face with patient in facility. Palliative Care was asked to follow this patient by consultation request of  Dr Nona Dell to address advance care planning and complex medical decision making. This is a follow up visit. ASSESSMENT AND PLAN / RECOMMENDATIONS:   Symptom Management/Plan: 1. ACP: Full code, ongoing discussion goc  2. Anorexia; improved Continue supplements, monitoring weights. Encourage to eat. BMI 20.8; weight 103 with 3lb weight gain   3. 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 Palliative care encounter; Palliative medicine team will continue to support patient, patient's family, and medical team. Visit consisted of counseling and education dealing with the complex and emotionally intense issues of symptom management and palliative care in the setting of serious and potentially life-threatening illness  Follow up Palliative Care Visit: Palliative care will continue to follow for complex medical decision making, advance care planning, and clarification of goals. Return 6 weeks or prn.  I spent 35 minutes providing this consultation. More than 50% of the time in this consultation was spent in counseling and care coordination.  PPS: 30%  HOSPICE ELIGIBILITY/DIAGNOSIS: TBD  Chief Complaint: Follow up Palliative consult for complex medical decision making   HISTORY  OF PRESENT ILLNESS:  Erica Stevens is a 69 y.o. year old female  with multiple medical problems including  Coronary artery disease s/p cabg, chronic systolic congestive heart failure, stroke, hyperlipidemia, dementia, allergies, anxiety, chronic kidney disease, chronic pain syndrome, degeneration of cervical intervertebral disc, hypertension, history of migraine, mitral valve disorder, osteoporosis, hypercholesterolemia, reflux esophagitis, thoracic lumbar neuritis, history of urinary incontinence, depression. Ms He continues to reside in [place] at [facility]. [name] does remain bed-bound, total ADL dependents for bathing, dressing, toileting as she is in comment. [name] does require assistance with feeding. Last psychiatry note 02/11/2021 for dementia with history of major neurocognitive disorder prescribed aricept with trazodone for sleep. Anxiety prescribed buspirone. Major depression with trazodone helping with mood. No changes at this visit. No recent wounds, falls, infections, hospitalizations per staff, no staff concerns. At present [name] is lying in bed reading her book. [name] appears debilitated, comfortable, no visitors present. [name] did make eye contact and say a few simple words in replay. [name] denied pain, shortness of breath. [name] was cooperative with assessment. Emotional support provided with limited discussion with cognitive impairment. Today PC visit Ms. Strnad was more verbal, interactive than previous PC visits. I have attempted to contact [facility] for update on pc visit. Medical goals reviewed. No changes recommended today. Updated staff.   12/10/2020 wbc 7.2, hgb 10.6, hct 31.4, platelets 181, sodium 144, potassium 4.3, chloride 108, co2 25, calcium 8.7, bun 10.2. creatinine 0.72, glucose 75, total protein 5.6, albumin 3.3, ast 25, alt 17   History obtained from review of EMR, discussion with facility  staff and  Ms. Schnider.  I reviewed available labs, medications, imaging, studies and related documents from the EMR.  Records reviewed and summarized above.   ROS Full 14 system review of systems performed and negative with exception of: as per HPI.   Physical Exam: Constitutional: NAD General: frail appearing, thin/WNWD/obese  EYES: lids intact ENMT: oral mucous membranes moist CV: S1S2, RRR Pulmonary: LCTA, no increased work of breathing Abdomen: soft and non tender MSK: bedbound, functional quadriplegic Skin: warm and dry Neuro:  + generalized weakness,  mild cognitive impairment Psych: non-anxious affect, A and O x 2  Questions and concerns were addressed. The patient/family was encouraged to call with questions and/or concerns. My business card was provided. Provided general support and encouragement, no other unmet needs identified   Thank you for the opportunity to participate in the care of [name].  The palliative care team will continue to follow. Please call our office at [phone] if we can be of additional assistance.   This chart was dictated using voice recognition software.  Despite best efforts to proofread,  errors can occur which can change the documentation meaning.   Alys Dulak Ihor Gully, NP

## 2021-04-30 ENCOUNTER — Encounter: Payer: Self-pay | Admitting: Nurse Practitioner

## 2021-04-30 ENCOUNTER — Non-Acute Institutional Stay: Payer: Medicare Other | Admitting: Nurse Practitioner

## 2021-04-30 VITALS — BP 102/53 | HR 78 | Temp 97.7°F | Resp 18 | Wt 110.0 lb

## 2021-04-30 DIAGNOSIS — Z515 Encounter for palliative care: Secondary | ICD-10-CM

## 2021-04-30 DIAGNOSIS — R5381 Other malaise: Secondary | ICD-10-CM

## 2021-04-30 DIAGNOSIS — R630 Anorexia: Secondary | ICD-10-CM

## 2021-04-30 NOTE — Progress Notes (Signed)
[employer] Palliative Care Consult Note Telephone: [phone]  Fax: [phone]    Date of encounter: 04/30/21 7:44 PM PATIENT NAME: Erica Stevens [place] [address]   [phone] (home)  DOB: [birth year] MRN: [id number] PRIMARY CARE PROVIDER:    Dr Lucianne Lei [facility]  RESPONSIBLE PARTY:    Contact Information     Name Relation Home Work Mobile   Erica Stevens Daughter [phone]     Erica Stevens Mother [phone]        I met face to face with patient in facility. Palliative Care was asked to follow this patient by consultation request of  Dr Nona Dell to address advance care planning and complex medical decision making. This is a follow up visit ASSESSMENT AND PLAN / RECOMMENDATIONS:   Symptom Management/Plan: 1. ACP: Full code, ongoing discussion goc   2. Palliative care encounter; Palliative medicine team will continue to support patient, patient's family, and medical team. Visit consisted of counseling and education dealing with the complex and emotionally intense issues of symptom management and palliative care in the setting of serious and potentially life-threatening illness   3. Anorexia; reviewed weights; gained 7 lbs; with current weight 110 lbs. Continue supplements, monitoring weights. Encourage to eat   4. 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 Return 6 weeks or prn.  I spent 43 minutes providing this consultation. More than 50% of the time in this consultation was spent in counseling and care coordination. PPS: 30%  Chief Complaint: Follow up palliative consult for complex medical decision making  HISTORY OF PRESENT ILLNESS:  Erica Stevens is a 70 y.o. year old female   with multiple medical problems including Coronary artery disease s/p cabg, chronic systolic congestive heart failure, stroke, hyperlipidemia, dementia, allergies, anxiety, chronic kidney disease, chronic pain syndrome, degeneration of cervical intervertebral disc, hypertension, history of migraine, mitral valve disorder, osteoporosis, hypercholesterolemia, reflux esophagitis, thoracic lumbar neuritis, history of urinary incontinence, depression. Erica Stevens continues to reside in [place] at [facility]. Erica Stevens does remain bed-bound, total ADL dependents for bathing, dressing, toileting as she is in comment. Erica Stevens does require assistance with feeding.  Last [employer] np note 02/25/1950 for pvd, stable. Followed by Psychiatry last visit 03/11/2021 for dementia continued on aricept. Zoloft for which she is tolerating well staff endorses little calmer. Anxiety prescribed buspirone. Major depressive disorder with h/o depression prescribed trazodone. Staff endorses no other changes. No recent falls, wounds, hospitalizations, infections. At present, Erica Stevens is lying in bed, has her book in her hand, Erica Stevens makes eye contact, asked how she was doing. Erica Stevens endorses she is alright. I asked Erica Stevens about the book she was reading. Erica Stevens endorses her book is a romance story. I asked what kind of books she likes to read, Erica Stevens endorses she likes romance. We talked about symptoms pain, sob which she denies. We talked about appetite, food she likes like a chocolate bars. We talked at length about getting oob. We talked about when her daughter visited. We talked about residing at snf. Limited discussion with difficulty with conversations, language. Emotional support provided. I have attempted to contact daughter. Medical goals reviewed. I updated staff, no changes today   History obtained from review of  EMR, discussion with facility staff and Erica Stevens.  I  reviewed available labs, medications, imaging, studies and related documents from the EMR.  Records reviewed and summarized above.   ROS Full 14 system review of systems performed and negative with exception of: as per HPI.   Physical Exam: Constitutional: NAD General: frail appearing, thin, debilitated, cognitive impaired female EYES: lids intact ENMT:  oral mucous membranes moist CV: S1S2, RRR, no LE edema Pulmonary: LCTA, no increased work of breathing, no cough, room air Abdomen: soft and non tender MSK: bedbound with muscle wasting; Skin: warm and dry Neuro:  + generalized weakness,  + cognitive impairment Psych: flat affect, Alert, oriented to self  Questions and concerns were addressed. Provided general support and encouragement, no other unmet needs identified   Thank you for the opportunity to participate in the care of Erica Stevens.  The palliative care team will continue to follow. Please call our office at [phone] if we can be of additional assistance.   This chart was dictated using voice recognition software.  Despite best efforts to proofread,  errors can occur which can change the documentation meaning.   Ulisses Vondrak Ihor Gully, NP

## 2021-05-03 ENCOUNTER — Other Ambulatory Visit: Payer: Self-pay

## 2021-07-05 ENCOUNTER — Non-Acute Institutional Stay: Payer: Medicare Other | Admitting: Nurse Practitioner

## 2021-07-05 ENCOUNTER — Other Ambulatory Visit: Payer: Self-pay

## 2021-07-05 ENCOUNTER — Encounter: Payer: Self-pay | Admitting: Nurse Practitioner

## 2021-07-05 VITALS — BP 147/83 | HR 78 | Temp 97.6°F | Resp 18 | Wt 112.5 lb

## 2021-07-05 DIAGNOSIS — I639 Cerebral infarction, unspecified: Secondary | ICD-10-CM

## 2021-07-05 DIAGNOSIS — Z515 Encounter for palliative care: Secondary | ICD-10-CM

## 2021-07-05 DIAGNOSIS — R630 Anorexia: Secondary | ICD-10-CM

## 2021-07-05 DIAGNOSIS — R5381 Other malaise: Secondary | ICD-10-CM

## 2021-07-05 NOTE — Progress Notes (Signed)
[place] Consult Note Telephone: [phone]  Fax: [phone]    Date of encounter: 07/05/21 3:59 PM PATIENT NAME: Erica Stevens [place] [address]   [phone] (home)  DOB: 1951-03-06 MRN: [id number] PRIMARY CARE PROVIDER:    [facility]  RESPONSIBLE PARTY:    Contact Information     Name Relation Home Work Mobile   [name],[name] Daughter [phone]     [name] Mother [phone]        I met face to face with patient in facility. Palliative Care was asked to follow this patient by consultation request of Dr Nicole Kindred  to address advance care planning and complex medical decision making. This is a follow up visit.                                  ASSESSMENT AND PLAN / RECOMMENDATIONS:  Symptom Management/Plan: 1. ACP: Full code, ongoing discussion goc   2. Palliative care encounter; Palliative medicine team will continue to support patient, patient's family, and medical team. Visit consisted of counseling and education dealing with the complex and emotionally intense issues of symptom management and palliative care in the setting of serious and potentially life-threatening illness   3. Anorexia; reviewed weights. Continue supplements, monitoring weights. Encourage to eat   4. 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 Spurling declined  Follow up Palliative Care Visit: Palliative care will continue to follow for complex medical decision making, advance care planning, and clarification of goals. Return 8 weeks or prn.  I spent 36 minutes providing this consultation starting at 2:00pm. More than 50% of the time in this consultation was spent in counseling and care coordination. PPS: 30%  Chief Complaint: Follow up palliative consult for complex medical decision making  HISTORY OF PRESENT ILLNESS:  Erica Stevens is a 70 y.o. year old  female  with multiple medical problems including Coronary artery disease s/p cabg, chronic systolic congestive heart failure, stroke, hyperlipidemia, dementia, allergies, anxiety, chronic kidney disease, chronic pain syndrome, degeneration of cervical intervertebral disc, hypertension, history of migraine, mitral valve disorder, osteoporosis, hypercholesterolemia, reflux esophagitis, thoracic lumbar neuritis, history of urinary incontinence, depression. Ms Leason continues to reside in [place] at [facility]. Ms Rudder does remain bed-bound, total ADL dependents for bathing, dressing, toileting as she is in comment. Ms Jeanbaptiste does require assistance with feeding. Current weight 112.5 lbs with bmi 22.7. Staff endorses no recent wounds, falls, hospitalizations, infections. Staff endorses no new concerns. I visited and observed Ms. Bouvier. Ms. Schriner did make eye contact. Answered simple questions replying no to ros. Ms. Kreher talked about her stuffed animal bird on her beside table. We talked about getting oob. Ms. Spath declined. We talked about food, importance of getting oob. Limited with cognitive impairment. We talked about her daughter, [name] visiting as Ms. Aveni continues to have difficulty with memory. Ms. Macmaster was cooperative with assessment. Emotional support provided. I updated staff, no new changes. Medical goals reviewed. I have attempted to contact [name], Ms [place] daughter.   04/07/2021 wbc 7.9, hemoglobin 11.2, hematocrit 33.1, platelets 265, sodium 143, potassium 3.9, chloride 104, CO2 23, calcium 9.3, BUN 11.8, creatinine 0.73, glucose 90, total protein 6.2, albumin 3.7, ALT 10, AST 18   History obtained from review of EMR, discussion with facility staff and Ms. Steck.  I reviewed available labs, medications, imaging, studies and related documents from the EMR.  Records reviewed and summarized above.   ROS Full 10 system review of  systems performed and negative with exception of: as per HPI.   Physical Exam: Constitutional: NAD General: frail appearing, thin, debilitated, cognitively impaired, chronically ill pleasant female EYES:  lids intact ENMT:  oral mucous membranes moist CV: S1S2, RRR, no LE edema Pulmonary: clear, decreased bases, no increased work of breathing, no cough, room air Abdomen: normo-active BS + 4 quadrants, soft and non tender MSK: bed-bound; functional quadriplegic Skin: warm and dry Neuro:  +generalized weakness,  + cognitive impairment Psych: non-anxious affect, A and Oriented to self  Questions and concerns were addressed. Provided general support and encouragement, no other unmet needs identified   Thank you for the opportunity to participate in the care of Ms. Stahlecker.  The palliative care team will continue to follow. Please call our office at [phone] if we can be of additional assistance.   This chart was dictated using voice recognition software.  Despite best efforts to proofread,  errors can occur which can change the documentation meaning.   Jakota Manthei Ihor Gully, NP

## 2021-09-03 ENCOUNTER — Other Ambulatory Visit: Payer: Self-pay

## 2021-09-03 ENCOUNTER — Non-Acute Institutional Stay: Payer: Medicare Other | Admitting: Nurse Practitioner

## 2021-09-03 ENCOUNTER — Encounter: Payer: Self-pay | Admitting: Nurse Practitioner

## 2021-09-03 VITALS — BP 128/74 | HR 76 | Temp 97.2°F | Wt 102.7 lb

## 2021-09-03 DIAGNOSIS — Z515 Encounter for palliative care: Secondary | ICD-10-CM

## 2021-09-03 DIAGNOSIS — I639 Cerebral infarction, unspecified: Secondary | ICD-10-CM

## 2021-09-03 DIAGNOSIS — R630 Anorexia: Secondary | ICD-10-CM

## 2021-09-03 DIAGNOSIS — R5381 Other malaise: Secondary | ICD-10-CM

## 2021-09-03 NOTE — Progress Notes (Signed)
[place] Consult Note Telephone: [phone]  Fax: [phone]    Date of encounter: 09/03/21 12:35 PM PATIENT NAME: Erica [place] [address]   [phone] (home)  DOB: [birth year] MRN: [id number] PRIMARY CARE PROVIDER:    [facility]  RESPONSIBLE PARTY:    Contact Information     Name Relation Home Work Mobile   Erica Stevens Daughter [phone]     Erica Stevens Mother [phone]        I met face to face with patient in facility. Palliative Care was asked to follow this patient by consultation request of  [place] to address advance care planning and complex medical decision making. This is a follow up visit.                                  ASSESSMENT AND PLAN / RECOMMENDATIONS:  Symptom Management/Plan: 1. ACP: Full code, ongoing discussion goc   2. Anorexia; improved Continue supplements, monitoring weights. Encourage to eat.  08/09/2021 weight 113 lbs 08/17/2021 weight 102.7 lbs BMI 20.7  3. 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 Palliative care encounter; Palliative medicine team will continue to support patient, patient's family, and medical team. Visit consisted of counseling and education dealing with the complex and emotionally intense issues of symptom management and palliative care in the setting of serious and potentially life-threatening illness   Follow up Palliative Care Visit: Palliative care will continue to follow for complex medical decision making, advance care planning, and clarification of goals. Return 4 weeks or prn.  I spent 37 minutes providing this consultation. More than 50% of the time in this consultation was spent in counseling and care coordination.  PPS: 30%  Chief Complaint: Follow up palliative consult for  complex medical decision making  HISTORY OF PRESENT ILLNESS:  Erica Stevens is a 70 y.o. year old female  with multiple medical problems including  Coronary artery disease s/p cabg, chronic systolic congestive heart failure, stroke, hyperlipidemia, dementia, allergies, anxiety, chronic kidney disease, chronic pain syndrome, degeneration of cervical intervertebral disc, hypertension, history of migraine, mitral valve disorder, osteoporosis, hypercholesterolemia, reflux esophagitis, thoracic lumbar neuritis, history of urinary incontinence, depression. Ms Erica Stevens continues to reside in [place] at [facility]. Ms Erica Stevens does remain bed-bound, total ADL dependents for bathing, dressing, toileting as she is in comment. Ms Erica Stevens does require assistance with feeding. No recent wounds, falls, infections, hospitalizations per staff, no staff concerns. At present Ms. Erica Stevens is lying in bed reading her book. Ms. Erica Stevens appears debilitated, comfortable, no visitors present. Ms. Erica Stevens did make eye contact and say a few simple words in replay. Ms. Erica Stevens denied pain, shortness of breath. Ms. Erica Stevens was cooperative with assessment. Emotional support provided with limited discussion with cognitive impairment. Today PC visit Ms. Erica Stevens was more verbal, interactive than previous PC visits. I have attempted to contact [facility] for update on pc visit. Medical goals reviewed. No changes recommended today. Updated staff.   History obtained from review of EMR, discussion with facility staff and Ms. Erica Stevens.  I reviewed available labs, medications, imaging, studies and related documents from the EMR.  Records reviewed and summarized above.   ROS Reviewed 10 point system all negative except HPI  Physical Exam:  Constitutional: NAD General: frail appearing, debilitated pleasant female EYES: lids intact ENMT: oral mucous membranes moist CV: S1S2, RRR Pulmonary: LCTA, no increased  work of breathing, no cough Abdomen: normo-active BS + 4 quadrants, soft and non tender MSK: functional quadriplegic Skin: warm and dry Neuro:  + generalized weakness,  + cognitive impairment Psych: non-anxious affect, A and Oriented to self,  Thank you for the opportunity to participate in the care of Ms. Erica Stevens.  The palliative care team will continue to follow. Please call our office at [phone] if we can be of additional assistance.   This chart was dictated using voice recognition software.  Despite best efforts to proofread,  errors can occur which can change the documentation meaning.   Questions and concerns were addressed.  Provided general support and encouragement, no other unmet needs identified   Bridget Westbrooks Ihor Gully, NP

## 2021-11-05 ENCOUNTER — Encounter: Payer: Self-pay | Admitting: Nurse Practitioner

## 2021-11-05 ENCOUNTER — Non-Acute Institutional Stay: Payer: Medicare Other | Admitting: Nurse Practitioner

## 2021-11-05 ENCOUNTER — Other Ambulatory Visit: Payer: Self-pay

## 2021-11-05 VITALS — BP 138/74 | HR 78 | Temp 97.0°F | Resp 18 | Wt 102.8 lb

## 2021-11-05 DIAGNOSIS — R630 Anorexia: Secondary | ICD-10-CM

## 2021-11-05 DIAGNOSIS — Z515 Encounter for palliative care: Secondary | ICD-10-CM

## 2021-11-05 DIAGNOSIS — I639 Cerebral infarction, unspecified: Secondary | ICD-10-CM

## 2021-11-05 DIAGNOSIS — R5381 Other malaise: Secondary | ICD-10-CM

## 2021-11-05 NOTE — Progress Notes (Signed)
[place] Consult Note Telephone: [phone]  Fax: [phone]    Date of encounter: 11/05/21 5:29 PM PATIENT NAME: Erica Stevens [place] [address]   [phone] (home)  DOB: 1951-01-16 MRN: [id number] PRIMARY CARE PROVIDER:    [facility]  RESPONSIBLE PARTY:    Contact Information     Name Relation Home Work Mobile   [name],[name] Daughter [place] Mother [phone]        I met face to face with patient in facility. Palliative Care was asked to follow this patient by consultation request of  [place] to address advance care planning and complex medical decision making. This is a follow up visit.                                ASSESSMENT AND PLAN / RECOMMENDATIONS:  Symptom Management/Plan: 1. ACP: Full code, ongoing discussion goc   2. Anorexia; improved Continue supplements, monitoring weights. Encourage to eat.  08/09/2021 weight 113 lbs 08/17/2021 weight 102.7 lbs 10/18/2021 weight 102.8 lbs   3. 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 Palliative care encounter; Palliative medicine team will continue to support patient, patient's family, and medical team. Visit consisted of counseling and education dealing with the complex and emotionally intense issues of symptom management and palliative care in the setting of serious and potentially life-threatening illness  Follow up Palliative Care Visit: Palliative care will continue to follow for complex medical decision making, advance care planning, and clarification of goals. Return 8 weeks or prn.  I spent 46 minutes providing this consultation. More than 50% of the time in this consultation was spent in counseling and care coordination. PPS: 30%  Chief Complaint: Follow up palliative  consult for complex medical decision making  HISTORY OF PRESENT ILLNESS:  Erica Stevens is a 71 y.o. year old female  with multiple medical problems including  Coronary artery disease s/p cabg, chronic systolic congestive heart failure, stroke, hyperlipidemia, dementia, allergies, anxiety, chronic kidney disease, chronic pain syndrome, degeneration of cervical intervertebral disc, hypertension, history of migraine, mitral valve disorder, osteoporosis, hypercholesterolemia, reflux esophagitis, thoracic lumbar neuritis, history of urinary incontinence, depression. Erica Stevens continues to reside in [place] at [facility]. Erica Stevens does remain bed-bound, total ADL dependents for bathing, dressing, toileting as she is in comment. Erica Stevens does require assistance with feeding. No recent wounds, falls, infections, hospitalizations per staff, no staff concerns. At present Erica. Stevens is lying in bed reading her book. Erica. Stevens appears debilitated, comfortable, no visitors present. Erica. Stevens did make eye contact, non-verbal. No meaningful discussion due to impairment. Emotional support provided. I contacted Erica Hummingbird, Erica. Stevens's daughter. Clinical update discussed, no new changes. Erica Stevens endorses she has not been able to visit as she herself has been sick. Hopefully this weekend. We talked about weight, appetite, oob, interactions. Medical goals reviewed. No changes recommended today. Updated staff.    History obtained from review of EMR, discussion with facility staff and Erica. Stevens.  I reviewed available labs, medications, imaging, studies and related documents from the EMR.  Records reviewed and summarized above.   ROS 10 point system reviewed with staff as Erica. Stevens is cognitively impaired for complex medical decision  making  Physical Exam: Constitutional: NAD General: frail appearing, debilitated cognitively impaired female EYES: lids intact ENMT:  oral  mucous membranes moist CV: S1S2, RRR Pulmonary: LCTA, no increased work of breathing, no cough, room air Abdomen: normo-active BS + 4 quadrants, soft and non tender MSK: functional quadriplegic Skin: warm and dry Neuro:  + generalized weakness,  + cognitive impairment Psych: flat affect, A and Oriented to self Thank you for the opportunity to participate in the care of Erica. Stevens.  The palliative care team will continue to follow. Please call our office at [phone] if we can be of additional assistance.   Michale Emmerich Ihor Gully, NP

## 2021-11-22 ENCOUNTER — Non-Acute Institutional Stay: Payer: Medicare Other | Admitting: Nurse Practitioner

## 2021-11-22 ENCOUNTER — Encounter: Payer: Self-pay | Admitting: Nurse Practitioner

## 2021-11-22 VITALS — BP 128/74 | HR 76 | Temp 97.8°F | Resp 18 | Wt 100.0 lb

## 2021-11-22 DIAGNOSIS — I639 Cerebral infarction, unspecified: Secondary | ICD-10-CM

## 2021-11-22 DIAGNOSIS — R630 Anorexia: Secondary | ICD-10-CM

## 2021-11-22 DIAGNOSIS — Z515 Encounter for palliative care: Secondary | ICD-10-CM

## 2021-11-22 DIAGNOSIS — R5381 Other malaise: Secondary | ICD-10-CM

## 2021-11-22 NOTE — Progress Notes (Signed)
? ? ?[facility] ?[facility] Palliative [facility] Consult Note ?Telephone: [phone]  ?Fax: [phone]  ? ? ?Date of encounter: 11/22/21 ?9:25 PM ?PATIENT NAME: Erica Stevens ?[place] [address]   ?[phone] (home)  ?DOB: [birth year] ?MRN: [id number] ?PRIMARY CARE PROVIDER:    ?[facility] ? ?RESPONSIBLE PARTY:    ?Contact Information   ? ? Name Relation Home Work Mobile  ? [name],[name] Daughter [phone]    ? [name] Mother [phone]    ? ?  ? ?I met face to face with patient in facility. Palliative Care was asked to follow this patient by consultation request of  [place] to address advance care planning and complex medical decision making. This is a follow up visit.                                  ?ASSESSMENT AND PLAN / RECOMMENDATIONS:  ?Symptom Management/Plan: ?1. ACP: Full code, ongoing discussion goc ?  ?2. Anorexia; improved Continue supplements, monitoring weights. Encourage to eat.  ?08/09/2021 weight 113 lbs ?08/17/2021 weight 102.7 lbs ?10/18/2021 weight 102.8 lbs ? 11/15/2021 weight 100 lbs ?13 lbs loss/4 months; 11.50% loss ? ?3. 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  ?4. Palliative care encounter; Palliative medicine team will continue to support patient, patient's family, and medical team. Visit consisted of counseling and education dealing with the complex and emotionally intense issues of symptom management and palliative care in the setting of serious and potentially life-threatening illness ? ?Follow up Palliative Care Visit: Palliative care will continue to follow for complex medical decision making, advance care planning, and clarification of goals. Return 2 weeks or prn. ? ?I spent 48 minutes providing this consultation starting at 2:30pm. More than 50% of the time in this consultation was spent in counseling  and care coordination. ?PPS: 30% ? ?Chief Complaint: Follow up palliative consult for complex medical decision making ? ?HISTORY OF PRESENT ILLNESS:  Erica Stevens is a 71 y.o. year old female  with multiple medical problems including coronary artery disease s/p cabg, chronic systolic congestive heart failure, stroke, hyperlipidemia, dementia, allergies, anxiety, chronic kidney disease, chronic pain syndrome, degeneration of cervical intervertebral disc, hypertension, history of migraine, mitral valve disorder, osteoporosis, hypercholesterolemia, reflux esophagitis, thoracic lumbar neuritis, history of urinary incontinence, depression. Erica Stevens continues to reside in [place] at [facility]. Erica Stevens does remain bed-bound, total ADL dependents for bathing, dressing, toileting as she is in comment. Erica Stevens does require assistance with feeding with declined appetite, weight loss with current weight 100 lbs. Erica. Stevens recently complained of knee pain with 11/18/2021 xray revealing a  Left tibal plateau fracture, seen by orthopedic. No weight bearing, f/u 4 weeks. At present Erica. Stevens is lying in bed reading her book. Erica. Stevens appears debilitated, comfortable, no visitors present. Erica. Stevens did make eye contact, non-verbal. No meaningful discussion due to impairment. Emotional support provided. I contacted [name], Erica. Stevens's daughter. Clinical update discussed, PC visit with Erica. [place]. [name] endorses recently Erica. Balon's mother has passed. [name] endorses she brought Erica. Stevens to the viewing but not able to bring to the funeral due to staff request. [name] endorses Erica. Stevens's knee was hurting so staff kept her at the facility. Discussed xray results with [name] as she  was not aware of the fracture. We talked about Orthopedic visit. We talked about weight loss, care plan meeting. [name] endorses she has not received a letter about the careplan, sometimes  her mail gets mixed up. Encouraged [name] to contact facility to set up a meeting concerning fracture, weight loss. We talked about medical goals including aggressive vs conservative vs comfort care. We talked about Hospice under Hospice benefit through Medicare. [name] talked about her Grandmother who recently passed was under hospice. We talked about grief counseling for Erica. Lucy Stevens, offered psychotherapy or a chaplain for grief counseling. [name] endorses she will think about it and make the request if she feels it would be beneficial. Therapeutic listening, emotional support provided. Medical goals reviewed. Questions answered, Will continue to monitor and follow for now. Updated staff.  ? ?History obtained from review of EMR, discussion with facility staff and Erica. Stevens.  ?I reviewed available labs, medications, imaging, studies and related documents from the EMR.  Records reviewed and summarized above.  ? ?ROS ?10 point system reviewed with staff with Erica. Stevens cognitive impairment all negative except HPI ? ?Physical Exam: ?Constitutional: NAD ?General: frail appearing, thin, debilitated, cognitively impaired female ?EYES:  lids intact ?ENMT: oral mucous membranes moist ?CV: S1S2, RRR ?Pulmonary: LCTA, no increased work of breathing, no cough, room air ?Abdomen: intake 100%, normo-active BS + 4 quadrants, soft and non tender ?MSK: bed-bound; lift to w/c ?Skin: warm and dry ?Neuro:  + generalized weakness,  + cognitive impairment ?Psych: flat affect, A and Oriented to self ?Thank you for the opportunity to participate in the care of Erica. Stevens.  The palliative care team will continue to follow. Please call our office at [phone] if we can be of additional assistance.  ? ?Tammy Wickliffe Z Kahlen Boyde, NP  ?  ? ?

## 2021-11-23 ENCOUNTER — Other Ambulatory Visit: Payer: Self-pay

## 2021-12-20 ENCOUNTER — Encounter: Payer: Self-pay | Admitting: Nurse Practitioner

## 2021-12-20 ENCOUNTER — Non-Acute Institutional Stay: Payer: Medicare Other | Admitting: Nurse Practitioner

## 2021-12-20 VITALS — BP 122/70 | HR 70 | Temp 97.0°F | Resp 18 | Wt 93.5 lb

## 2021-12-20 DIAGNOSIS — R5381 Other malaise: Secondary | ICD-10-CM

## 2021-12-20 DIAGNOSIS — I639 Cerebral infarction, unspecified: Secondary | ICD-10-CM

## 2021-12-20 DIAGNOSIS — R630 Anorexia: Secondary | ICD-10-CM

## 2021-12-20 DIAGNOSIS — Z515 Encounter for palliative care: Secondary | ICD-10-CM

## 2021-12-20 NOTE — Progress Notes (Signed)
? ? ?Manufacturing engineer ?Community Palliative Care Consult Note ?Telephone: [phone]  ?Fax: [phone]  ? ? ?Date of encounter: 12/20/21 ?7:55 PM ?PATIENT NAME: Erica Stevens ?[place] [address]   ?[phone] (home)  ?DOB: 02/03/1951 ?MRN: [id number] ?PRIMARY CARE PROVIDER:    ?[facility] ? ?RESPONSIBLE PARTY:    ?Contact Information   ? ? Name Relation Home Work Mobile  ? [name],[name] Daughter [phone]    ? [name] Mother [phone]    ? ?  ? ?I met face to face with patient in facility. Palliative Care was asked to follow this patient by consultation request of  [place] to address advance care planning and complex medical decision making. This is a follow up visit.                                  ?ASSESSMENT AND PLAN / RECOMMENDATIONS:  ?Symptom Management/Plan: ?1. ACP: Full code, ongoing discussion goc ?  ?2. Anorexia; improved Continue supplements, monitoring weights. Encourage to eat.  ?08/09/2021 weight 113 lbs ?08/17/2021 weight 102.7 lbs ?10/18/2021 weight 102.8 lbs ?11/15/2021 weight 100 lbs ?12/17/2021 weight 93.5 lbs ?BMI 18.9 ?19.5 lbs in 4 months; 17.26% ?6.5 lbs in 4 weeks; 6.50% ? ?3. 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  ?4. Palliative care encounter; Palliative medicine team will continue to support patient, patient's family, and medical team. Visit consisted of counseling and education dealing with the complex and emotionally intense issues of symptom management and palliative care in the setting of serious and potentially life-threatening illness ? ?Follow up Palliative Care Visit: Palliative care will continue to follow for complex medical decision making, advance care planning, and clarification of goals. Return 4 weeks or prn. ? ?I spent 37 minutes providing this consultation starting at 1:15 pm. More than  50% of the time in this consultation was spent in counseling and care coordination. ?PPS: 30% ? ?Chief Complaint: Follow up palliative consult for complex medical decision making ? ?HISTORY OF PRESENT ILLNESS:  Erica Stevens is a 71 y.o. year old female  with multiple medical problems including coronary artery disease s/p cabg, chronic systolic congestive heart failure, stroke, hyperlipidemia, dementia, allergies, anxiety, chronic kidney disease, chronic pain syndrome, degeneration of cervical intervertebral disc, hypertension, history of migraine, mitral valve disorder, osteoporosis, hypercholesterolemia, reflux esophagitis, thoracic lumbar neuritis, history of urinary incontinence, depression. Erica Stevens continues to reside in [place] at [facility]. Erica Stevens does remain bed-bound, total ADL dependents for bathing, dressing, toileting as she is in comment. Erica Stevens does require assistance with feeding with declined appetite, weight loss with current weight 100 lbs. Erica. Stevens recently complained of knee pain with 11/18/2021 xray revealing a  Left tibal plateau fracture, seen by orthopedic. No weight bearing, f/u 4 weeks. At present Erica. Stevens is lying in bed reading her book. Erica. Stevens appears debilitated, comfortable, no visitors present. Erica. Stevens did make eye contact, made a few sounds as words. No meaningful discussion due to impairment. Erica. Stevens was cooperative with assessment. Erica. Stevens was more interactive today. Most of visit emotional support provided. I attempted to contacted Erica Hummingbird, Erica. Stevens's daughter, will continue to revisit option of hospice. Therapeutic listening, emotional support provided. Medical goals reviewed. Questions answered, Will continue to monitor and follow for  now. Updated staff.  .  ? ?History obtained from review of EMR, discussion facility staff and Erica. Carden.  ?I reviewed available labs, medications, imaging, studies and  related documents from the EMR.  Records reviewed and summarized above.  ? ?ROS ?10  point system reviewed with staff as Erica. Stevens is cognitively impaired all negative except HPI ? ?Physical Exam: ?Constitutional: NAD ?General: frail appearing, thin, confused female ?EYES:  lids intact ?ENMT: oral mucous membranes moist, ?CV: S1S2, RRR ?Pulmonary: clear, decrease bases, no increased work of breathing, no cough, room air ?Abdomen: normo-active BS + 4 quadrants, soft and non tender ?MSK: bed-bound; functionally quadriplegic ?Skin: warm and dry ?Neuro:  + generalized weakness,  + cognitive impairment ?Psych: flat affect, A and Oriented to person ?Thank you for the opportunity to participate in the care of Erica. Stevens.  The palliative care team will continue to follow. Please call our office at [phone] if we can be of additional assistance.  ? ?[name] [name], NP  ? ? ?

## 2022-02-11 ENCOUNTER — Encounter: Payer: Self-pay | Admitting: Nurse Practitioner

## 2022-02-11 ENCOUNTER — Non-Acute Institutional Stay: Payer: Medicare Other | Admitting: Nurse Practitioner

## 2022-02-11 VITALS — BP 116/72 | HR 60 | Temp 97.6°F | Resp 18 | Wt 101.0 lb

## 2022-02-11 DIAGNOSIS — I639 Cerebral infarction, unspecified: Secondary | ICD-10-CM

## 2022-02-11 DIAGNOSIS — R630 Anorexia: Secondary | ICD-10-CM

## 2022-02-11 DIAGNOSIS — R5381 Other malaise: Secondary | ICD-10-CM

## 2022-02-11 DIAGNOSIS — Z515 Encounter for palliative care: Secondary | ICD-10-CM

## 2022-02-11 NOTE — Progress Notes (Addendum)
[facility] Consult Note Telephone: [phone]  Fax: [phone]    Date of encounter: 02/11/22 3:22 PM PATIENT NAME: Erica Stevens [id number]   [phone] (home)  DOB: 21-Sep-1950 MRN: [id number] PRIMARY CARE PROVIDER:    [facility]  RESPONSIBLE PARTY:    Contact Information     Name Relation Home Work Mobile   Erica Stevens Daughter [phone]     Erica Stevens Mother [phone]        I met face to face with patient in facility. Palliative Care was asked to follow this patient by consultation request of  [place] to address advance care planning and complex medical decision making. This is a follow up visit.                                  ASSESSMENT AND PLAN / RECOMMENDATIONS:  Symptom Management/Plan: 1. ACP: Full code, ongoing discussion goc   2. Anorexia; improved Continue supplements, monitoring weights. Encourage to eat.  08/09/2021 weight 113 lbs 08/17/2021 weight 102.7 lbs 10/18/2021 weight 102.8 lbs  11/15/2021 weight 100 lbs 02/08/2022 weight 101 lbs   3. 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 Palliative care encounter; Palliative medicine team will continue to support patient, patient's family, and medical team. Visit consisted of counseling and education dealing with the complex and emotionally intense issues of symptom management and palliative care in the setting of serious and potentially life-threatening illness   Follow up Palliative Care Visit: Palliative care will continue to follow for complex medical decision making, advance care planning, and clarification of goals. Return 4 weeks or prn.   I spent 46 minutes providing this consultation starting at 11:15am.  More than 50% of the time in this consultation was spent in counseling and  care coordination. PPS: 30%   Chief Complaint: Follow up palliative consult for complex medical decision making   HISTORY OF PRESENT ILLNESS:  Erica Stevens is a 71 y.o. year old female  with multiple medical problems including coronary artery disease s/p cabg, chronic systolic congestive heart failure, stroke, hyperlipidemia, dementia, allergies, anxiety, chronic kidney disease, chronic pain syndrome, degeneration of cervical intervertebral disc, hypertension, history of migraine, mitral valve disorder, osteoporosis, hypercholesterolemia, reflux esophagitis, thoracic lumbar neuritis, history of urinary incontinence, depression. Erica Stevens continues to reside in [place] at [facility]. Erica Stevens does remain bed-bound, total ADL dependents for bathing, dressing, toileting as she is in comment. Erica Stevens does require assistance with feeding with declined appetite. Staff endorses no recent falls, wounds, hospitalizations, infections. At present Erica. Stevens was lying in bed, appears debilitated, makes eye contacts, mumbles few clear words, other words more difficult to understand. Erica. Stevens was cooperative with assessment. No meaningful discussion due to cognitive impairment. Most of PC visit, supportive visit, revisit weights. 1 lb weight gain. Medical goals reviewed. I attempted to contact [name], Erica. Stevens's daughter, for update on pc visit. No new changes to poc, updated staff.    History obtained from review of EMR, discussion with facility staff and Erica. Stevens.  I reviewed available labs, medications, imaging, studies and related documents from the EMR.  Records reviewed and summarized above.    ROS 10 point system reviewed with staff with Erica. Stevens  cognitive impairment all negative except HPI   Physical Exam: Constitutional: NAD General: frail appearing, thin, debilitated, cognitively impaired female EYES:  lids intact ENMT: oral mucous  membranes moist CV: S1S2, RRR Pulmonary: LCTA, no increased work of breathing, no cough, room air Abdomen: intake 100%, normo-active BS + 4 quadrants, soft and non tender MSK: bed-bound; lift to w/c Skin: warm and dry Neuro:  + generalized weakness,  + cognitive impairment Psych: flat affect, A and Oriented to self  Thank you for the opportunity to participate in the care of Erica. Stevens.  The palliative care team will continue to follow. Please call our office at [phone] if we can be of additional assistance.   Trinity Haun Ihor Gully, NP

## 2022-03-26 ENCOUNTER — Emergency Department
Admission: EM | Admit: 2022-03-26 | Discharge: 2022-03-26 | Disposition: A | Discharge status: Skilled Nursing Facility | Payer: Medicare Other | Attending: Emergency Medicine | Admitting: Emergency Medicine

## 2022-03-26 DIAGNOSIS — I509 Heart failure, unspecified: Secondary | ICD-10-CM | POA: Diagnosis not present

## 2022-03-26 DIAGNOSIS — I2510 Atherosclerotic heart disease of native coronary artery without angina pectoris: Secondary | ICD-10-CM | POA: Diagnosis not present

## 2022-03-26 DIAGNOSIS — E861 Hypovolemia: Secondary | ICD-10-CM | POA: Insufficient documentation

## 2022-03-26 DIAGNOSIS — Z951 Presence of aortocoronary bypass graft: Secondary | ICD-10-CM | POA: Insufficient documentation

## 2022-03-26 DIAGNOSIS — I110 Hypertensive heart disease with heart failure: Secondary | ICD-10-CM | POA: Diagnosis not present

## 2022-03-26 DIAGNOSIS — F0390 Unspecified dementia without behavioral disturbance: Secondary | ICD-10-CM | POA: Diagnosis not present

## 2022-03-26 DIAGNOSIS — N3000 Acute cystitis without hematuria: Secondary | ICD-10-CM | POA: Insufficient documentation

## 2022-03-26 DIAGNOSIS — I9589 Other hypotension: Secondary | ICD-10-CM

## 2022-03-26 DIAGNOSIS — I959 Hypotension, unspecified: Secondary | ICD-10-CM | POA: Insufficient documentation

## 2022-03-26 LAB — COMPREHENSIVE METABOLIC PANEL
ALT: 22 U/L (ref 0–44)
AST: 28 U/L (ref 15–41)
Albumin: 3.2 g/dL — ABNORMAL LOW (ref 3.5–5.0)
Alkaline Phosphatase: 96 U/L (ref 38–126)
Anion gap: 6 (ref 5–15)
BUN: 32 mg/dL — ABNORMAL HIGH (ref 8–23)
CO2: 23 mmol/L (ref 22–32)
Calcium: 8.4 mg/dL — ABNORMAL LOW (ref 8.9–10.3)
Chloride: 107 mmol/L (ref 98–111)
Creatinine, Ser: 1.05 mg/dL — ABNORMAL HIGH (ref 0.44–1.00)
GFR, Estimated: 57 mL/min — ABNORMAL LOW (ref >60)
Glucose, Bld: 95 mg/dL (ref 70–99)
Potassium: 4 mmol/L (ref 3.5–5.1)
Sodium: 136 mmol/L (ref 135–145)
Total Bilirubin: 0.5 mg/dL (ref 0.3–1.2)
Total Protein: 6.9 g/dL (ref 6.5–8.1)

## 2022-03-26 LAB — CBC WITH DIFFERENTIAL/PLATELET
Abs Immature Granulocytes: 0.02 10*3/uL (ref 0.00–0.07)
Basophils Absolute: 0 10*3/uL (ref 0.0–0.1)
Basophils Relative: 0 %
Eosinophils Absolute: 0.5 10*3/uL (ref 0.0–0.5)
Eosinophils Relative: 5 %
HCT: 34.4 % — ABNORMAL LOW (ref 36.0–46.0)
Hemoglobin: 10.8 g/dL — ABNORMAL LOW (ref 12.0–15.0)
Immature Granulocytes: 0 %
Lymphocytes Relative: 52 %
Lymphs Abs: 5.3 10*3/uL — ABNORMAL HIGH (ref 0.7–4.0)
MCH: 30.2 pg (ref 26.0–34.0)
MCHC: 31.4 g/dL (ref 30.0–36.0)
MCV: 96.1 fL (ref 80.0–100.0)
Monocytes Absolute: 0.7 10*3/uL (ref 0.1–1.0)
Monocytes Relative: 7 %
Neutro Abs: 3.8 10*3/uL (ref 1.7–7.7)
Neutrophils Relative %: 36 %
Platelets: 299 10*3/uL (ref 150–400)
RBC: 3.58 MIL/uL — ABNORMAL LOW (ref 3.87–5.11)
RDW: 15.1 % (ref 11.5–15.5)
WBC: 10.4 10*3/uL (ref 4.0–10.5)
nRBC: 0 % (ref 0.0–0.2)

## 2022-03-26 LAB — URINALYSIS, ROUTINE W REFLEX MICROSCOPIC
Bilirubin Urine: NEGATIVE
Glucose, UA: NEGATIVE mg/dL
Ketones, ur: NEGATIVE mg/dL
Nitrite: NEGATIVE
Protein, ur: NEGATIVE mg/dL
Specific Gravity, Urine: 1.009 (ref 1.005–1.030)
WBC, UA: >50 WBC/hpf — ABNORMAL HIGH (ref 0–5)
pH: 5 (ref 5.0–8.0)

## 2022-03-26 MED ORDER — CEFPODOXIME PROXETIL 200 MG PO TABS: 200.0000 mg | ORAL_TABLET | Freq: Two times a day (BID) | ORAL | 0 refills | Status: AC

## 2022-03-26 MED ORDER — LACTATED RINGERS IV BOLUS
500.0000 mL | Freq: Once | INTRAVENOUS | Status: AC
  Administered 2022-03-26: 500 mL via INTRAVENOUS

## 2022-03-26 MED ORDER — SODIUM CHLORIDE 0.9 % IV SOLN
1.0000 g | Freq: Once | INTRAVENOUS | Status: AC
  Administered 2022-03-26: 1 g via INTRAVENOUS
  Filled 2022-03-26: qty 10

## 2022-03-26 MED ORDER — LACTATED RINGERS IV BOLUS
1000.0000 mL | Freq: Once | INTRAVENOUS | Status: AC
  Administered 2022-03-26: 1000 mL via INTRAVENOUS

## 2022-03-26 NOTE — ED Provider Notes (Signed)
[facility] Provider Note    Event Date/Time   First MD Initiated Contact with Patient 03/26/22 [phone]     (approximate)   History   Chief Complaint Hypotension   HPI  Erica Stevens is a 71 y.o. female with past medical history of hypertension, hyperlipidemia, CAD status post CABG, CHF, stroke, chronic pain syndrome, polysubstance abuse, and dementia who presents to the ED for hypotension.  History is limited due to patient's baseline dementia and majority of history obtained from EMS.  They state that staff at [facility] healthcare noted patient's blood pressure has been low over the past 24 hours.  They have been holding her blood pressure medications but readings have been low despite this.  Patient currently denies any complaints, states she is not sure why she is here.  She is at her baseline mental status per EMS.  Per chart review, patient follows with palliative care for poor appetite and oral intake.     Physical Exam   Triage Vital Signs: ED Triage Vitals  Enc Vitals Group     BP      Pulse      Resp      Temp      Temp src      SpO2      Weight      Height      Head Circumference      Peak Flow      Pain Score      Pain Loc      Pain Edu?      Excl. in GC?     Most recent vital signs: Vitals:   03/26/22 0445 03/26/22 0530  BP: (!) 110/51 123/60  Pulse: (!) 59 (!) 55  Resp:    Temp:    SpO2: 100% 100%    Constitutional: Alert and oriented to person and place. Eyes: Conjunctivae are normal. Head: Atraumatic. Nose: No congestion/rhinnorhea. Mouth/Throat: Mucous membranes are dry. Cardiovascular: Normal rate, regular rhythm. Grossly normal heart sounds.  2+ radial pulses bilaterally. Respiratory: Normal respiratory effort.  No retractions. Lungs CTAB. Gastrointestinal: Soft and nontender. No distention. Musculoskeletal: No lower extremity tenderness nor edema.  Neurologic:  Normal speech and language. No gross focal  neurologic deficits are appreciated.    ED Results / Procedures / Treatments   Labs (all labs ordered are listed, but only abnormal results are displayed) Labs Reviewed  CBC WITH DIFFERENTIAL/PLATELET - Abnormal; Notable for the following components:      Result Value   RBC 3.58 (*)    Hemoglobin 10.8 (*)    HCT 34.4 (*)    Lymphs Abs 5.3 (*)    All other components within normal limits  COMPREHENSIVE METABOLIC PANEL - Abnormal; Notable for the following components:   BUN 32 (*)    Creatinine, Ser 1.05 (*)    Calcium 8.4 (*)    Albumin 3.2 (*)    GFR, Estimated 57 (*)    All other components within normal limits  URINALYSIS, ROUTINE W REFLEX MICROSCOPIC - Abnormal; Notable for the following components:   Color, Urine YELLOW (*)    APPearance CLOUDY (*)    Hgb urine dipstick SMALL (*)    Leukocytes,Ua LARGE (*)    WBC, UA >50 (*)    Bacteria, UA MANY (*)    All other components within normal limits  URINE CULTURE     EKG  ED ECG REPORT I, Chesley Noon, the attending physician, personally viewed and interpreted  this ECG.   Date: 03/26/2022  EKG Time: 3:24  Rate: 61  Rhythm: normal sinus rhythm  Axis: LAD  Intervals:none  ST&T Change: LVH  PROCEDURES:  Critical Care performed: No  Procedures   MEDICATIONS ORDERED IN ED: Medications  lactated ringers bolus 1,000 mL (0 mLs Intravenous Stopped 03/26/22 0516)  cefTRIAXone (ROCEPHIN) 1 g in sodium chloride 0.9 % 100 mL IVPB (0 g Intravenous Stopped 03/26/22 0532)  lactated ringers bolus 500 mL (500 mLs Intravenous New Bag/Given 03/26/22 0524)     IMPRESSION / MDM / ASSESSMENT AND PLAN / ED COURSE  I reviewed the triage vital signs and the nursing notes.                              71 y.o. female with past medical history of dementia, hypertension, hyperlipidemia, CAD status post CABG, CHF, stroke, chronic pain syndrome, and polysubstance abuse.  Patient's presentation is most consistent with acute  presentation with potential threat to life or bodily function.  Differential diagnosis includes, but is not limited to, dehydration, AKI, electrolyte abnormality, ACS, sepsis, pneumonia, UTI.  Patient nontoxic-appearing and in no acute distress, appears at her baseline mental status.  Blood pressure borderline low here in the ED but vitals are otherwise reassuring with no signs of sepsis.  She is asymptomatic at this time with a nonfocal neurologic exam.  EKG shows no evidence of arrhythmia or ischemia and I suspect low blood pressure is related to dehydration given her long history of poor oral intake.  We will hydrate with IV fluids and screen labs, reassess.  Blood pressure improved following 1.5 L of IV fluids.  Labs are reassuring with mild AKI, no significant electrolyte abnormality, anemia, or leukocytosis.  Urinalysis does appear concerning for UTI, which could be contributing to her decreased oral intake.  She was given a dose of IV Rocephin and urine sent for culture.  She is appropriate for outpatient management and discharged back to her nursing facility, will be prescribed cefpodoxime for UTI.  Patient counseled to return to the ED for new or worsening symptoms, patient agrees with plan.      FINAL CLINICAL IMPRESSION(S) / ED DIAGNOSES   Final diagnoses:  Hypotension due to hypovolemia  Acute cystitis without hematuria     Rx / DC Orders   ED Discharge Orders          Ordered    cefpodoxime (VANTIN) 200 MG tablet  2 times daily        03/26/22 0617             Note:  This document was prepared using Dragon voice recognition software and may include unintentional dictation errors.   Chesley Noon, MD 03/26/22 [phone]

## 2022-03-26 NOTE — ED Notes (Signed)
[facility] to transport to [facility]. 

## 2022-03-26 NOTE — ED Notes (Signed)
RN attempted to call facility. No answer at this time.

## 2022-03-26 NOTE — ED Triage Notes (Signed)
Pt coming from [facility] for hypotension. 94/50 BP with EMS. Pt is alert and oriented to baseline dementia.

## 2022-03-28 LAB — URINE CULTURE: Culture: 60000 — AB

## 2022-04-18 ENCOUNTER — Non-Acute Institutional Stay: Payer: Medicare Other | Admitting: Nurse Practitioner

## 2022-04-18 ENCOUNTER — Encounter: Payer: Self-pay | Admitting: Nurse Practitioner

## 2022-04-18 VITALS — BP 140/72 | HR 70 | Temp 97.6°F | Resp 18 | Wt 107.7 lb

## 2022-04-18 DIAGNOSIS — R630 Anorexia: Secondary | ICD-10-CM

## 2022-04-18 DIAGNOSIS — I639 Cerebral infarction, unspecified: Secondary | ICD-10-CM

## 2022-04-18 DIAGNOSIS — Z515 Encounter for palliative care: Secondary | ICD-10-CM

## 2022-04-18 DIAGNOSIS — R5381 Other malaise: Secondary | ICD-10-CM

## 2022-04-18 NOTE — Progress Notes (Signed)
  [facility] Community Palliative Care Consult Note Telephone: [phone]  Fax: [phone]    Date of encounter: 04/18/22 2:32 PM PATIENT NAME: Erica Stevens [address] [place] [address]   [phone] (home)  DOB: 09/28/1950 MRN: [id number] PRIMARY CARE PROVIDER:    [facility]  RESPONSIBLE PARTY:    Contact Information     Name Relation Home Work Mobile   [name],[name] Daughter [phone]     Hancock,Betty J Mother [phone]         I met face to face with patient in facility. Palliative Care was asked to follow this patient by consultation request of  [facility] to address advance care planning and complex medical decision making. This is a follow up visit.                                  ASSESSMENT AND PLAN / RECOMMENDATIONS:  Symptom Management/Plan: 1. ACP: Full code, ongoing discussion goc   2. Anorexia; improved Continue supplements, monitoring weights. Encourage to eat. Ms. Marschall was more interactive today, praised for eating 08/09/2021 weight 113 lbs 08/17/2021 weight 102.7 lbs 10/18/2021 weight 102.8 lbs 11/15/2021 weight 100 lbs 02/08/2022 weight 101 lbs 04/14/2022 weight 107.7 lbs 6.7 lbs gain/2 months  3. 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 Palliative care encounter; Palliative medicine team will continue to support patient, patient's family, and medical team. Visit consisted of counseling and education dealing with the complex and emotionally intense issues of symptom management and palliative care in the setting of serious and potentially life-threatening illness   Follow up Palliative Care Visit: Palliative care will continue to follow for complex medical decision making, advance care planning, and clarification of goals. Return 4 weeks or prn.   I spent 45 minutes providing this  consultation starting at 1:45 pm.  More than 50% of the time in this consultation was spent in counseling and care coordination. PPS: 30%   Chief Complaint: Follow up palliative consult for complex medical decision making   HISTORY OF PRESENT ILLNESS:  Erica Stevens is a 71 y.o. year old female  with multiple medical problems including coronary artery disease s/p cabg, chronic systolic congestive heart failure, stroke, hyperlipidemia, dementia, allergies, anxiety, chronic kidney disease, chronic pain syndrome, degeneration of cervical intervertebral disc, hypertension, history of migraine, mitral valve disorder, osteoporosis, hypercholesterolemia, reflux esophagitis, thoracic lumbar neuritis, history of urinary incontinence, depression. Ms Dicola continues to reside in [facility]. Ms Calia does remain bed-bound, total ADL dependents for bathing, dressing, toileting as she is in comment. Ms Eyer does require assistance with feeding with declined appetite. Ms. Linnemann has moved to another room per staff. Staff endorses no recent falls, wounds. Recent visit to ED on 03/26/2022 for hypotension, dehydration requiring IVF with UTI treated with abx. At present Ms. Elie was lying in bed, appears debilitated, makes eye contacts, with answering with clear words, putting few sentences.  Ms. Cleckler was cooperative with assessment. No meaningful discussion due to cognitive impairment. Most of PC visit, supportive visit, we talked about her stuffed bird she has. "I don't know where it went, I think it is lost". I called [name], Ms. Ruby's daughter, clinical update given. Discussed at length about weights, nutrition, room changes, her bird, today more   cognitive, with recent increase in ativan does not appear as anxious. Medical goals reviewed. We talked about at recent visit to ED with dehydration, UTI. We talked about overall concerns, role pc in poc. No  new changes to poc, updated staff.    History obtained from review of EMR, discussion with facility staff and Ms. Bellis.  I reviewed available labs, medications, imaging, studies and related documents from the EMR.  Records reviewed and summarized above.    ROS 10 point system reviewed with staff with Ms. Boroff cognitive impairment all negative except HPI   Physical Exam: Constitutional: NAD General: frail appearing, thin, debilitated, cognitively impaired female EYES:  lids intact ENMT: oral mucous membranes moist CV: S1S2, RRR Pulmonary: LCTA, no increased work of breathing, no cough, room air Abdomen: intake 100%, normo-active BS + 4 quadrants, soft and non tender MSK: bed-bound; lift to w/c Skin: warm and dry Neuro:  + generalized weakness,  + cognitive impairment Psych: flat affect, A and Oriented to self  Thank you for the opportunity to participate in the care of Ms. Parrella.  The palliative care team will continue to follow. Please call our office at [phone] if we can be of additional assistance.   Decker Cogdell Ihor Gully, NP

## 2022-05-27 ENCOUNTER — Non-Acute Institutional Stay: Payer: Medicare Other | Admitting: Nurse Practitioner

## 2022-05-27 ENCOUNTER — Encounter: Payer: Self-pay | Admitting: Nurse Practitioner

## 2022-05-27 VITALS — BP 112/46 | HR 76 | Temp 97.6°F | Resp 18 | Wt 107.7 lb

## 2022-05-27 DIAGNOSIS — Z515 Encounter for palliative care: Secondary | ICD-10-CM

## 2022-05-27 DIAGNOSIS — R5381 Other malaise: Secondary | ICD-10-CM

## 2022-05-27 DIAGNOSIS — R630 Anorexia: Secondary | ICD-10-CM

## 2022-05-27 DIAGNOSIS — I639 Cerebral infarction, unspecified: Secondary | ICD-10-CM

## 2022-05-27 NOTE — Progress Notes (Signed)
[facility] Consult Note Telephone: [phone]  Fax: [phone]    Date of encounter: 05/27/22 2:36 PM PATIENT NAME: Erica Stevens [place] [address]   [phone] (home)  DOB: 03-21-1951 MRN: [id number] PRIMARY CARE PROVIDER:    Steele Sizer, MD,  [address] [place] [address] [place] [address] [phone]  RESPONSIBLE PARTY:    Contact Information     Name Relation Home Work Mobile   Erica Stevens,Erica Stevens Daughter [phone]     Erica Stevens Mother [phone]             I met face to face with patient in facility. Palliative Care was asked to follow this patient by consultation request of  Erica Stevens to address advance care planning and complex medical decision making. This is a follow up visit.                                  ASSESSMENT AND PLAN / RECOMMENDATIONS:  Symptom Management/Plan: 1. ACP: Full code, ongoing discussion goc   2. Anorexia; improved weight gain; Continue supplements, monitoring weights. Encourage to eat. PC supportive role 08/09/2021 weight 113 lbs 08/17/2021 weight 102.7 lbs 10/18/2021 weight 102.8 lbs 11/15/2021 weight 100 lbs 02/08/2022 weight 101 lbs 04/14/2022 weight 107.7 lbs   3. 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 Palliative care encounter; Palliative medicine team will continue to support patient, patient's family, and medical team. Visit consisted of counseling and education dealing with the complex and emotionally intense issues of symptom management and palliative care in the setting of serious and potentially life-threatening illness   Follow up Palliative Care Visit: Palliative care will continue to follow for complex medical decision making, advance care planning, and clarification of goals. Return 4 weeks or prn.   I spent 45 minutes  providing this consultation starting at 1:45 pm.  More than 50% of the time in this consultation was spent in counseling and care coordination. PPS: 30%   Chief Complaint: Follow up palliative consult for complex medical decision making   HISTORY OF PRESENT ILLNESS:  Erica Stevens is a 71 y.o. year old female  with multiple medical problems including coronary artery disease s/p cabg, chronic systolic congestive heart failure, stroke, hyperlipidemia, dementia, allergies, anxiety, chronic kidney disease, chronic pain syndrome, degeneration of cervical intervertebral disc, hypertension, history of migraine, mitral valve disorder, osteoporosis, hypercholesterolemia, reflux esophagitis, thoracic lumbar neuritis, history of urinary incontinence, depression. [name] continues to reside in [facility] [facility] at [facility]. [name] does remain bed-bound, total ADL dependents for bathing, dressing, toileting as she is in comment. [name] does require assistance with feeding with declined appetite. Ms. Forge has moved to another room per staff. Staff endorses no recent falls, wounds. Recent visit to ED on 03/26/2022 for hypotension, dehydration requiring IVF with UTI treated with abx. At present Ms. Prewitt was lying in bed, appears debilitated, makes eye contacts, with answering with clear words. Ms. Luo was engaging today. Talked about her hair cut. "I did not want to have my hair cut, they I was rolled in the room, it was cut". [name] was interactive, more than normal PC visits, denies pain. Ms. Hasten requested a ginger ale, notified nursing. Supportive role, ros, asymptomatic currently, cooperative with assessment.  Medical goals reviewed. I attempted to contact Erica Stevens, for update on visit. No new changes to poc, updated staff.    History obtained from review of EMR, discussion with facility staff and Ms. Nass.  I reviewed available labs, medications, imaging,  studies and related documents from the EMR.  Records reviewed and summarized above.    ROS 10 point system reviewed with staff with Ms. Loor cognitive impairment all negative except HPI   Physical Exam: Constitutional: NAD General: frail appearing, thin, debilitated, cognitively impaired female EYES:  lids intact ENMT: oral mucous membranes moist CV: S1S2, RRR Pulmonary: LCTA, no increased work of breathing, no cough, room air Abdomen: intake 100%, normo-active BS + 4 quadrants, soft and non tender MSK: bed-bound; lift to w/c Skin: warm and dry Neuro:  + generalized weakness,  + cognitive impairment Psych: flat affect, A and Oriented to self  Thank you for the opportunity to participate in the care of Ms. Thrush.  The palliative care team will continue to follow. Please call our office at [phone] if we can be of additional assistance.   Erica Angst Ihor Gully, NP

## 2022-06-17 ENCOUNTER — Encounter: Payer: Self-pay | Admitting: Nurse Practitioner

## 2022-06-17 ENCOUNTER — Non-Acute Institutional Stay: Payer: Medicare Other | Admitting: Nurse Practitioner

## 2022-06-17 VITALS — BP 132/68 | HR 74 | Temp 97.2°F | Resp 18 | Wt 107.9 lb

## 2022-06-17 DIAGNOSIS — Z515 Encounter for palliative care: Secondary | ICD-10-CM

## 2022-06-17 DIAGNOSIS — R630 Anorexia: Secondary | ICD-10-CM

## 2022-06-17 DIAGNOSIS — R5381 Other malaise: Secondary | ICD-10-CM

## 2022-06-17 DIAGNOSIS — I639 Cerebral infarction, unspecified: Secondary | ICD-10-CM

## 2022-06-17 NOTE — Progress Notes (Signed)
[place] Consult Note Telephone: [phone]  Fax: [phone]    Date of encounter: 06/17/22 12:42 PM PATIENT NAME: Erica Stevens [place] [address]   [phone] (home)  DOB: [birth year] MRN: [id number] PRIMARY CARE PROVIDER:    [facility] RESPONSIBLE PARTY:    Contact Information     Name Relation Home Work Mobile   Erica Stevens,Erica Stevens Stevens [phone]     Erica Stevens Mother [phone]            I met face to face with patient in facility. Palliative Care was asked to follow this patient by consultation request of  [place] to address advance care planning and complex medical decision making. This is a follow up visit.                                  ASSESSMENT AND PLAN / RECOMMENDATIONS:  Symptom Management/Plan: 1. ACP: Full code, ongoing discussion goc   2. Anorexia; improved weight gain; Continue supplements, monitoring weights. Encourage to eat. PC supportive role 08/09/2021 weight 113 lbs 08/17/2021 weight 102.7 lbs 10/18/2021 weight 102.8 lbs 11/15/2021 weight 100 lbs 02/08/2022 weight 101 lbs 04/14/2022 weight 107.7 lbs 06/16/2022 weight 107.9 lbs   3. 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 Palliative care encounter; Palliative medicine team will continue to support patient, patient's family, and medical team. Visit consisted of counseling and education dealing with the complex and emotionally intense issues of symptom management and palliative care in the setting of serious and potentially life-threatening illness   Follow up Palliative Care Visit: Palliative care will continue to follow for complex medical decision making, advance care planning, and clarification of goals. Return 4 weeks or prn.   I spent  45 minutes providing this consultation starting at 10:15 am.  More than 50% of the time in this consultation was spent in counseling and care coordination. PPS: 30%   Chief Complaint: Follow up palliative consult for complex medical decision making   HISTORY OF PRESENT ILLNESS:  Erica Stevens is a 71 y.o. year old female  with multiple medical problems including coronary artery disease s/p cabg, chronic systolic congestive heart failure, stroke, hyperlipidemia, dementia, allergies, anxiety, chronic kidney disease, chronic pain syndrome, degeneration of cervical intervertebral disc, hypertension, history of migraine, mitral valve disorder, osteoporosis, hypercholesterolemia, reflux esophagitis, thoracic lumbar neuritis, history of urinary incontinence, depression. Erica Stevens continues to reside in [place] at [facility]. Erica Stevens does remain bed-bound, total ADL dependents for bathing, dressing, toileting as she is in comment. Erica Stevens does require assistance with feeding. Staff endorses no recent falls, hospitalizations, wounds, infections. Staff endorses no other changes. I visited and observed Erica. Stevens, appears comfortable, sleeping. Erica. Stevens awoke to verbal cues, made eye contact then returned to sleep. Erica. Stevens was cooperative with assessment. I called Erica. Durden's Stevens Erica Stevens, lengthy discussion about clinical presentation, weights, appetite, mobility, medical goals reviewed, visiting facility. Erica Stevens talked about caregiver stressors with her husband at home, self care, support provided. No changes recommended to goc today. Updated staff.   History obtained from review of EMR, discussion with facility staff and Erica Stevens.  I reviewed available labs, medications, imaging, studies and related documents from  the EMR.  Records reviewed and summarized above.    ROS 10 point system reviewed with staff with Erica Stevens cognitive impairment all  negative except HPI   Physical Exam: Constitutional: NAD General: frail appearing, thin, debilitated, cognitively impaired female EYES:  lids intact ENMT: oral mucous membranes moist CV: S1S2, RRR Pulmonary: LCTA, no increased work of breathing, no cough, room air Abdomen: intake 100%, normo-active BS + 4 quadrants, soft and non tender MSK: bed-bound; lift to w/c Skin: warm and dry Neuro:  + generalized weakness,  + cognitive impairment Psych: flat affect, A and Oriented to self  Thank you for the opportunity to participate in the care of Erica Stevens.  The palliative care team will continue to follow. Please call our office at [phone] if we can be of additional assistance.   Shadee Rathod Ihor Gully, NP

## 2022-08-03 ENCOUNTER — Non-Acute Institutional Stay: Payer: Medicare Other | Admitting: Nurse Practitioner

## 2022-08-03 VITALS — BP 136/70 | HR 64 | Temp 97.3°F | Resp 20 | Wt 112.5 lb

## 2022-08-03 DIAGNOSIS — Z515 Encounter for palliative care: Secondary | ICD-10-CM

## 2022-08-03 DIAGNOSIS — R630 Anorexia: Secondary | ICD-10-CM

## 2022-08-03 DIAGNOSIS — R5381 Other malaise: Secondary | ICD-10-CM

## 2022-08-03 DIAGNOSIS — I639 Cerebral infarction, unspecified: Secondary | ICD-10-CM

## 2022-08-03 NOTE — Progress Notes (Signed)
[facility] Consult Note Telephone: [phone]  Fax: [phone]    Date of encounter: 08/03/22 2:17 PM PATIENT NAME: Erica Stevens   [phone] (home)  DOB: 18-Dec-1950 MRN: [id number] PRIMARY CARE PROVIDER:    [facility]  RESPONSIBLE PARTY:    Contact Information     Name Relation Home Work Mobile   Erica Stevens Daughter [phone]     Erica Stevens Mother [phone]        I met face to face with patient in facility. Palliative Care was asked to follow this patient by consultation request of  [place] to address advance care planning and complex medical decision making. This is a follow up visit.                                  ASSESSMENT AND PLAN / RECOMMENDATIONS:  Symptom Management/Plan: 1. ACP: Full code, ongoing discussion goc   2. Anorexia; continues to improve; improved weight gain; Continue supplements, monitoring weights. Encourage to eat. PC supportive role 08/09/2021 weight 113 lbs 08/17/2021 weight 102.7 lbs 10/18/2021 weight 102.8 lbs 11/15/2021 weight 100 lbs 02/08/2022 weight 101 lbs 04/14/2022 weight 107.7 lbs 06/16/2022 weight 107.9 lbs 07/20/2022 weight 112.5 lbs 5.2 lbs gain 3. 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 Palliative care encounter; Palliative medicine team will continue to support patient, patient's family, and medical team. Visit consisted of counseling and education dealing with the complex and emotionally intense issues of symptom management and palliative care in the setting of serious and potentially life-threatening illness   Follow up Palliative Care Visit: Palliative care will continue to follow for complex medical decision making, advance care planning, and  clarification of goals. Return 4 weeks or prn.   I spent 32 minutes providing this consultation starting at 10:45 am.  More than 50% of the time in this consultation was spent in counseling and care coordination. PPS: 30%   Chief Complaint: Follow up palliative consult for complex medical decision making   HISTORY OF PRESENT ILLNESS:  Erica Stevens is a 71 y.o. year old female  with multiple medical problems including coronary artery disease s/p cabg, chronic systolic congestive heart failure, stroke, hyperlipidemia, dementia, allergies, anxiety, chronic kidney disease, chronic pain syndrome, degeneration of cervical intervertebral disc, hypertension, history of migraine, mitral valve disorder, osteoporosis, hypercholesterolemia, reflux esophagitis, thoracic lumbar neuritis, history of urinary incontinence, depression. Erica Stevens continues to reside in [place] at [facility]. Erica Stevens does remain bed-bound, total ADL dependents for bathing, dressing, toileting as she is in comment. Erica Stevens does require assistance with feeding. Staff endorses no recent falls, hospitalizations, wounds, infections. Staff endorses no other changes. I visited and observed Erica. Stevens, appears comfortable, sleeping. Erica. Stevens awoke to verbal cues, few clear words, no meaningful discussion due to cognitive impairment. Erica. Stevens was cooperative with assessment. Medical goals reviewed. No changes recommended to goc today. Updated staff. I attempted to contact Erica Hummingbird, Erica. Stevens's daughter for update.   History obtained from review of EMR, discussion with facility staff and Erica. Stevens.  I reviewed available labs, medications, imaging, studies and related documents from the EMR.  Records reviewed and summarized above.    ROS  10 point system reviewed with staff with Erica Stevens cognitive impairment all negative except HPI   Physical Exam: Constitutional: NAD General: frail  appearing, thin, debilitated, cognitively impaired female EYES:  lids intact ENMT: oral mucous membranes moist CV: S1S2, RRR Pulmonary: LCTA, no increased work of breathing, no cough, room air Abdomen: intake 100%, normo-active BS + 4 quadrants, soft and non tender MSK: bed-bound; lift to w/c Skin: warm and dry Neuro:  + generalized weakness,  + cognitive impairment Psych: flat affect, A and Oriented to self  Thank you for the opportunity to participate in the care of Erica Stevens. Please call our office at [phone] if we can be of additional assistance.   Erica Mchaney Ihor Gully, NP

## 2022-08-31 ENCOUNTER — Non-Acute Institutional Stay: Payer: Medicare Other | Admitting: Nurse Practitioner

## 2022-08-31 ENCOUNTER — Encounter: Payer: Self-pay | Admitting: Nurse Practitioner

## 2022-08-31 VITALS — BP 122/64 | HR 64 | Temp 97.4°F | Resp 18 | Wt 113.1 lb

## 2022-08-31 DIAGNOSIS — Z515 Encounter for palliative care: Secondary | ICD-10-CM

## 2022-08-31 DIAGNOSIS — I639 Cerebral infarction, unspecified: Secondary | ICD-10-CM

## 2022-08-31 DIAGNOSIS — R5381 Other malaise: Secondary | ICD-10-CM

## 2022-08-31 DIAGNOSIS — R630 Anorexia: Secondary | ICD-10-CM

## 2022-08-31 NOTE — Progress Notes (Signed)
[facility] Consult Note Telephone: [phone]  Fax: [phone]    Date of encounter: 08/31/22 8:25 PM PATIENT NAME: Erica Stevens Erica Stevens   [phone] (home)  DOB: [birth year] MRN: [id number] PRIMARY CARE PROVIDER:    [facility]  RESPONSIBLE PARTY:    Contact Information     Name Relation Home Work Mobile   Erica Stevens Daughter [phone]     Erica Stevens Mother [phone]       I met face to face with patient in facility. Palliative Care was asked to follow this patient by consultation request of  [place] to address advance care planning and complex medical decision making. This is a follow up visit.                                  ASSESSMENT AND PLAN / RECOMMENDATIONS:  Symptom Management/Plan: 1. ACP: Full code, ongoing discussion goc   2. Anorexia; continues to improve; improved weight gain; Continue supplements, monitoring weights. Encourage to eat. PC supportive role 08/09/2021 weight 113 lbs 08/17/2021 weight 102.7 lbs 10/18/2021 weight 102.8 lbs 11/15/2021 weight 100 lbs 02/08/2022 weight 101 lbs 04/14/2022 weight 107.7 lbs 06/16/2022 weight 107.9 lbs 07/20/2022 weight 112.5 lbs 08/18/2022 weight 113 lbs 3. 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 Palliative care encounter; Palliative medicine team will continue to support patient, patient's family, and medical team. Visit consisted of counseling and education dealing with the complex and emotionally intense issues of symptom management and palliative care in the setting of serious and potentially life-threatening illness   Follow up Palliative Care Visit: Palliative care will continue to follow for complex medical decision making, advance care  planning, and clarification of goals. Return 4 to 8 weeks or prn.   I spent 45 minutes providing this consultation.  More than 50% of the time in this consultation was spent in counseling and care coordination. PPS: 30%   Chief Complaint: Follow up palliative consult for complex medical decision making   HISTORY OF PRESENT ILLNESS:  Erica Stevens is a 71 y.o. year old female  with multiple medical problems including coronary artery disease s/p cabg, chronic systolic congestive heart failure, stroke, hyperlipidemia, dementia, allergies, anxiety, chronic kidney disease, chronic pain syndrome, degeneration of cervical intervertebral disc, hypertension, history of migraine, mitral valve disorder, osteoporosis, hypercholesterolemia, reflux esophagitis, thoracic lumbar neuritis, history of urinary incontinence, depression. Erica Erica Stevens continues to reside in [place] at [facility]. Erica Erica Stevens does remain bed-bound, total ADL dependents for bathing, dressing, toileting as she is in comment. Erica Stevens does require assistance with feeding. Staff endorses no recent falls, hospitalizations, wounds, infections. Staff endorses no other changes. I visited and observed Erica Stevens, appears comfortable, sleeping. Erica Stevens awoke to verbal cues, more talkative today though limited due to cognitive impairment. Erica Stevens talked about a unicorn stuffed animal that church members brought her and a blanket, some about their visit. We talked about her daughter, Erica Stevens. We talked about getting oob, which she declined. Talked about appetite, foods she likes, attempted daily routine. Erica Stevens was cooperative with assessment. Medical goal, medications, poc reviewed. No changes recommended to goc today. Updated staff. I attempted to contact Erica Hummingbird, Erica Stevens's  daughter for update.   History obtained from review of EMR, discussion with facility staff and Erica Stevens.  I reviewed  available labs, medications, imaging, studies and related documents from the EMR.  Records reviewed and summarized above.    ROS 10 point system reviewed with staff with Erica. Stevens cognitive impairment all negative except HPI   Physical Exam: Constitutional: NAD General: frail appearing, thin, debilitated, cognitively impaired female EYES:  lids intact ENMT: oral mucous membranes moist CV: S1S2, RRR Pulmonary: LCTA, no increased work of breathing, no cough, room air Abdomen: intake 100%, normo-active BS + 4 quadrants, soft and non tender MSK: bed-bound; lift to w/c Skin: warm and dry Neuro:  + generalized weakness,  + cognitive impairment Psych: flat affect, A and Oriented to self Thank you for the opportunity to participate in the care of Erica Stevens. Please call our office at [phone] if we can be of additional assistance.    Erica Gully, NP

## 2022-09-22 ENCOUNTER — Non-Acute Institutional Stay: Payer: Medicare Other | Admitting: Nurse Practitioner

## 2022-09-22 ENCOUNTER — Encounter: Payer: Self-pay | Admitting: Nurse Practitioner

## 2022-09-22 VITALS — BP 111/60 | HR 60 | Temp 98.0°F | Resp 18 | Wt 117.0 lb

## 2022-09-22 DIAGNOSIS — I639 Cerebral infarction, unspecified: Secondary | ICD-10-CM

## 2022-09-22 DIAGNOSIS — Z515 Encounter for palliative care: Secondary | ICD-10-CM

## 2022-09-22 DIAGNOSIS — R5381 Other malaise: Secondary | ICD-10-CM

## 2022-09-22 DIAGNOSIS — R630 Anorexia: Secondary | ICD-10-CM

## 2022-09-22 NOTE — Progress Notes (Addendum)
[facility] Consult Note Telephone: [phone]  Fax: [phone]    Date of encounter: 09/22/22 4:48 PM PATIENT NAME: Erica Stevens  [place] [address]   [phone] (home)  DOB: 03-19-1951 MRN: [id number] PRIMARY CARE PROVIDER:    [facility]  RESPONSIBLE PARTY:    Contact Information     Name Relation Home Work Mobile   Erica Stevens Daughter [phone]     Erica Stevens Mother [phone]       I met face to face with patient in facility. Palliative Care was asked to follow this patient by consultation request of  [place] to address advance care planning and complex medical decision making. This is a follow up visit.                                  ASSESSMENT AND PLAN / RECOMMENDATIONS:  Symptom Management/Plan: 1. ACP: Full code, ongoing discussion goc   2. Anorexia; continues to improve; improved weight gain; Continue supplements, monitoring weights. Encourage to eat. PC supportive role 08/09/2021 weight 113 lbs 08/17/2021 weight 102.7 lbs 10/18/2021 weight 102.8 lbs 11/15/2021 weight 100 lbs 02/08/2022 weight 101 lbs 04/14/2022 weight 107.7 lbs 06/16/2022 weight 107.9 lbs 07/20/2022 weight 112.5 lbs 08/18/2022 weight 113 lbs 09/19/2022 weight 117 lbs 4 lbs gain /4 weeks 3. 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 Palliative care encounter; Palliative medicine team will continue to support patient, patient's family, and medical team. Visit consisted of counseling and education dealing with the complex and emotionally intense issues of symptom management and palliative care in the setting of serious and potentially life-threatening illness   Follow up Palliative Care Visit: Palliative care will continue to follow for  complex medical decision making, advance care planning, and clarification of goals. Return 4 to 8 weeks or prn.   I spent 35 minutes providing this consultation visit started at 12:00 pm.  More than 50% of the time in this consultation was spent in counseling and care coordination. PPS: 30%   Chief Complaint: Follow up palliative consult for complex medical decision making   HISTORY OF PRESENT ILLNESS:  Erica Stevens is a 72 y.o. year old female  with multiple medical problems including coronary artery disease s/p cabg, chronic systolic congestive heart failure, stroke, hyperlipidemia, dementia, allergies, anxiety, chronic kidney disease, chronic pain syndrome, degeneration of cervical intervertebral disc, hypertension, history of migraine, mitral valve disorder, osteoporosis, hypercholesterolemia, reflux esophagitis, thoracic lumbar neuritis, history of urinary incontinence, depression. Erica Stevens continues to reside in [place] at [facility]. Erica Stevens does remain bed-bound, total ADL dependents for bathing, dressing, toileting as she is in comment. Erica Stevens does require assistance with feeding. Staff endorses no recent falls, hospitalizations, wounds, infections. Staff endorses no other changes. I visited and observed Erica. Stevens, appears comfortable, Erica. Stevens nodded to answers today, not engaging, sleepy. Erica Stevens was cooperative with assessment. Supportive visit and monitoring weights, symptoms, appetite, progression of chronic disease. Medical goal, medications, poc reviewed. No changes recommended to goc today. Updated staff. I attempted to contact Erica Hummingbird, Erica. Stevens's daughter for update.   History obtained from review of EMR, discussion with facility staff and Erica. Stevens.  I reviewed available labs, medications, imaging,  studies and related documents from the EMR.  Records reviewed and summarized above.    Physical Exam: Constitutional:  NAD General: frail appearing, thin, debilitated, cognitively impaired female EYES:  lids intact ENMT: oral mucous membranes moist CV: S1S2, RRR Pulmonary: LCTA, no increased work of breathing, no cough, room air Abdomen: intake 100%, normo-active BS + 4 quadrants, soft and non tender MSK: bed-bound; lift to w/c Skin: warm and dry Neuro:  + generalized weakness,  + cognitive impairment Psych: flat affect, A and Oriented to self  Thank you for the opportunity to participate in the care of Erica. Stevens. Please call our office at [phone] if we can be of additional assistance.   Joseph Johns Ihor Gully, NP

## 2022-10-18 ENCOUNTER — Non-Acute Institutional Stay: Payer: Medicare Other | Admitting: Nurse Practitioner

## 2022-10-18 ENCOUNTER — Encounter: Payer: Self-pay | Admitting: Nurse Practitioner

## 2022-10-18 VITALS — BP 118/64 | HR 62 | Temp 97.4°F | Resp 18 | Wt 117.0 lb

## 2022-10-18 DIAGNOSIS — Z515 Encounter for palliative care: Secondary | ICD-10-CM

## 2022-10-18 DIAGNOSIS — R630 Anorexia: Secondary | ICD-10-CM

## 2022-10-18 DIAGNOSIS — R5381 Other malaise: Secondary | ICD-10-CM

## 2022-10-18 DIAGNOSIS — I639 Cerebral infarction, unspecified: Secondary | ICD-10-CM

## 2022-10-18 NOTE — Progress Notes (Signed)
[place] Consult Note Telephone: [phone]  Fax: [phone]    Date of encounter: 10/18/22 11:07 AM PATIENT NAME: Erica Stevens [place] [address]   [phone] (home)  DOB: 12-01-1950 MRN: [id number] PRIMARY CARE PROVIDER:    [facility]  RESPONSIBLE PARTY:    Contact Information     Name Relation Home Work Mobile   [name],[name] Daughter [phone]     [name] Mother [phone]       I met face to face with patient in facility. Palliative Care was asked to follow this patient by consultation request of  [place] to address advance care planning and complex medical decision making. This is a follow up visit.                                  ASSESSMENT AND PLAN / RECOMMENDATIONS:  Symptom Management/Plan: 1. ACP: Full code, ongoing discussion goc   2. Anorexia; continues to improve; improved weight gain; Continue supplements, monitoring weights. Encourage to eat. PC supportive role 08/09/2021 weight 113 lbs 08/17/2021 weight 102.7 lbs 10/18/2021 weight 102.8 lbs 11/15/2021 weight 100 lbs 02/08/2022 weight 101 lbs 04/14/2022 weight 107.7 lbs 06/16/2022 weight 107.9 lbs 07/20/2022 weight 112.5 lbs 08/18/2022 weight 113 lbs 09/19/2022 weight 117 lbs  3. 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 Fall precautions, continue to encourage staff to get oob. Functionally encourage restorative exercises.    4. Palliative care encounter; Palliative medicine team will continue to support patient, patient's family, and medical team. Visit consisted of counseling and education dealing with the complex and emotionally intense issues of symptom management and palliative care in the setting of serious and potentially life-threatening illness   Follow up Palliative Care Visit: Palliative care will continue to follow for complex  medical decision making, advance care planning, and clarification of goals. Return 4 to 8 weeks or prn.   I spent 46 minutes providing this consultation visit started at 10:00 am.  More than 50% of the time in this consultation was spent in counseling and care coordination. PPS: 30%   Chief Complaint: Follow up palliative consult for complex medical decision making   HISTORY OF PRESENT ILLNESS:  Erica Stevens is a 72 y.o. year old female  with multiple medical problems including coronary artery disease s/p cabg, chronic systolic congestive heart failure, stroke, hyperlipidemia, dementia, allergies, anxiety, chronic kidney disease, chronic pain syndrome, degeneration of cervical intervertebral disc, hypertension, history of migraine, mitral valve disorder, osteoporosis, hypercholesterolemia, reflux esophagitis, thoracic lumbar neuritis, history of urinary incontinence, depression. Erica Stevens continues to reside in [place] at [facility]. Erica Stevens does remain bed-bound, total ADL dependents for bathing, dressing, toileting as she is in comment. Erica Stevens does require assistance with feeding. Staff endorses no recent falls, hospitalizations, wounds, infections. Staff endorses no other changes. I visited and observed Erica. Stevens, appears comfortable. Erica Stevens did make eye contact, did not say words today, cooperative with assessment. Limited with cognitive impairment. Support provided. Medications, poc, goc reviewed. Attempted to reach [place], dtg, updated staff, no new changes recommended today.    History obtained from review of EMR, discussion with facility staff and Erica. Rozell.  I reviewed available labs, medications, imaging, studies and related documents from the EMR.  Records reviewed and summarized above.  Physical Exam: Constitutional: NAD General: frail appearing, thin, debilitated, cognitively impaired female EYES:  lids intact ENMT: oral  mucous membranes moist CV: S1S2, RRR Pulmonary: LCTA, no increased work of breathing, no cough, room air Abdomen: intake 100%, normo-active BS + 4 quadrants, soft and non tender MSK: bed-bound; lift to w/c Skin: warm and dry Neuro:  + generalized weakness,  + cognitive impairment Psych: flat affect, A and Oriented to self  Thank you for the opportunity to participate in the care of Erica. Stevens. Please call our office at [phone] if we can be of additional assistance.   Mella Inclan Ihor Gully, NP

## 2022-11-14 ENCOUNTER — Non-Acute Institutional Stay: Payer: Medicare Other | Admitting: Nurse Practitioner

## 2022-11-14 ENCOUNTER — Encounter: Payer: Self-pay | Admitting: Nurse Practitioner

## 2022-11-14 VITALS — BP 125/80 | HR 70 | Temp 97.7°F | Resp 18 | Wt 120.5 lb

## 2022-11-14 DIAGNOSIS — R630 Anorexia: Secondary | ICD-10-CM

## 2022-11-14 DIAGNOSIS — I639 Cerebral infarction, unspecified: Secondary | ICD-10-CM

## 2022-11-14 DIAGNOSIS — Z515 Encounter for palliative care: Secondary | ICD-10-CM

## 2022-11-14 DIAGNOSIS — R5381 Other malaise: Secondary | ICD-10-CM

## 2022-11-14 NOTE — Progress Notes (Signed)
[place] Consult Note Telephone: [phone]  Fax: [phone]    Date of encounter: 11/14/22 2:35 PM PATIENT NAME: Erica Stevens [place] [address]   [phone] (home)  DOB: [birth year] MRN: [id number] PRIMARY CARE PROVIDER:    [facility]  RESPONSIBLE PARTY:    Contact Information     Name Relation Home Work Mobile   Erica Stevens Daughter [phone]     Erica Stevens Mother [phone]          I met face to face with patient in facility. Palliative Care was asked to follow this patient by consultation request of  [place] to address advance care planning and complex medical decision making. This is a follow up visit.                                  ASSESSMENT AND PLAN / RECOMMENDATIONS:  Symptom Management/Plan: 1. ACP: Full code, ongoing discussion goc   2. Anorexia; continues to improve; significant improved weight gain; Continue supplements, monitoring weights. Encourage to eat. PC supportive role 06/16/2022 weight 107.9 lbs 07/20/2022 weight 112.5 lbs 08/18/2022 weight 113 lbs 09/19/2022 weight 117 lbs  10/20/2023 weight 120.5 lbs 3. 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 Fall precautions, continue to encourage staff to get oob. Functionally encourage restorative exercises.    4. Palliative care encounter; Palliative medicine team will continue to support patient, patient's family, and medical team. Visit consisted of counseling and education dealing with the complex and emotionally intense issues of symptom management and palliative care in the setting of serious and potentially life-threatening illness   Follow up Palliative Care Visit: PC f/u visit further discussion monitor trends of appetite, weights, monitor for functional, cognitive decline with chronic disease progression, assess any active  symptoms, supportive role. Palliative care will continue to follow for complex medical decision making, advance care planning, and clarification of goals. Return 4 to 8 weeks or prn.   I spent 45 minutes providing this consultation visit started at 1:15 pm.  More than 50% of the time in this consultation was spent in counseling and care coordination. PPS: 30%   Chief Complaint: Follow up palliative consult for complex medical decision making   HISTORY OF PRESENT ILLNESS:  Erica Stevens is a 72 y.o. year old female  with multiple medical problems including coronary artery disease s/p cabg, chronic systolic congestive heart failure, stroke, hyperlipidemia, dementia, allergies, anxiety, chronic kidney disease, chronic pain syndrome, degeneration of cervical intervertebral disc, hypertension, history of migraine, mitral valve disorder, osteoporosis, hypercholesterolemia, reflux esophagitis, thoracic lumbar neuritis, history of urinary incontinence, depression. Erica Stevens continues to reside in [place] at [facility]. Erica Stevens does remain bed-bound, total ADL dependents for bathing, dressing, toileting as she is in comment. Erica Stevens does require assistance with feeding. Staff endorses no recent falls, hospitalizations, wounds, infections. Staff endorses no other changes. I visited and observed Erica. Stevens, appears comfortable. Erica Stevens did make eye contact, answer simple questions with yes or no today. Erica Stevens was feeding herself, no s/s aspiration. We talked about getting oob and Erica Stevens shook her head no. Erica Stevens was cooperative with assessment, reviewed medications, goc, weights, poc. Supportive visit. Attempted to contact Erica Stevens, dtg, unable to leave a message, updated staff. Purpose of today PC f/u  visit further discussion monitor trends of appetite, weights, monitor for functional, cognitive decline with chronic disease progression, assess any active  symptoms, supportive role.   History obtained from review of EMR, discussion with facility staff and Erica. Stevens.  I reviewed available labs, medications, imaging, studies and related documents from the EMR.  Records reviewed and summarized above.    Physical Exam General: frail appearing, thin, debilitated, cognitively impaired female ENMT: oral mucous membranes moist CV: S1S2, RRR Pulmonary: LCTA Skin: warm and dry Neuro:  + generalized weakness,  + cognitive impairment Psych: flat affect, A and Oriented to self  Thank you for the opportunity to participate in the care of Erica Stevens. Please call our office at [phone] if we can be of additional assistance.   Erica Jayne Ihor Gully, NP

## 2022-12-29 ENCOUNTER — Encounter: Payer: Self-pay | Admitting: Nurse Practitioner

## 2022-12-29 ENCOUNTER — Non-Acute Institutional Stay: Payer: Medicare Other | Admitting: Nurse Practitioner

## 2022-12-29 VITALS — BP 144/62 | HR 55 | Temp 98.1°F | Resp 18 | Wt 123.0 lb

## 2022-12-29 DIAGNOSIS — R630 Anorexia: Secondary | ICD-10-CM

## 2022-12-29 DIAGNOSIS — R5381 Other malaise: Secondary | ICD-10-CM

## 2022-12-29 DIAGNOSIS — I639 Cerebral infarction, unspecified: Secondary | ICD-10-CM

## 2022-12-29 DIAGNOSIS — Z515 Encounter for palliative care: Secondary | ICD-10-CM

## 2022-12-29 NOTE — Progress Notes (Addendum)
Therapist, nutritional Palliative Care Consult Note Telephone: [phone]  Fax: [phone]    Date of encounter: 12/29/22 3:00 PM PATIENT NAME: Erica Stevens [address] [place] [address]   [phone] (home)  DOB: 1951-09-10 MRN: [id number] PRIMARY CARE PROVIDER:    [facility]  RESPONSIBLE PARTY:    Contact Information     Name Relation Home Work Mobile   [name],[name] Daughter [phone]     Leigh Aurora Mother [phone]          I met face to face with patient in facility. Palliative Care was asked to follow this patient by consultation request of  [facility] to address advance care planning and complex medical decision making. This is a follow up visit.                                  ASSESSMENT AND PLAN / RECOMMENDATIONS:  Symptom Management/Plan: 1. ACP: Full code, ongoing discussion goc   2. Anorexia; continues to improve; significant improved weight gain; Continue supplements, monitoring weights. Encourage to eat. PC supportive role 06/16/2022 weight 107.9 lbs 07/20/2022 weight 112.5 lbs 08/18/2022 weight 113 lbs 09/19/2022 weight 117 lbs 10/20/2023 weight 120.5 lbs 12/14/2022 weight 123 lbs  Gain;  3. 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 Fall precautions, continue to encourage staff to get oob. Functionally encourage restorative exercises.    4. Palliative care encounter; Palliative medicine team will continue to support patient, patient's family, and medical team. Visit consisted of counseling and education dealing with the complex and emotionally intense issues of symptom management and palliative care in the setting of serious and potentially life-threatening illness   Follow up Palliative Care Visit: PC f/u visit further discussion monitor trends of appetite, weights, monitor for functional, cognitive decline with chronic disease  progression, assess any active symptoms, supportive role. Palliative care will continue to follow for complex medical decision making, advance care planning, and clarification of goals. Return 4 to 8 weeks or prn.   I spent 47 minutes providing this consultation.  More than 50% of the time in this consultation was spent in counseling and care coordination. PPS: 30%   Chief Complaint: Follow up palliative consult for complex medical decision making   HISTORY OF PRESENT ILLNESS:  Erica Stevens is a 72 y.o. year old female  with multiple medical problems including coronary artery disease s/p cabg, chronic systolic congestive heart failure, stroke, hyperlipidemia, dementia, allergies, anxiety, chronic kidney disease, chronic pain syndrome, degeneration of cervical intervertebral disc, hypertension, history of migraine, mitral valve disorder, osteoporosis, hypercholesterolemia, reflux esophagitis, thoracic lumbar neuritis, history of urinary incontinence, depression. Erica Stevens continues to reside in [facility] at [facility]. Erica Stevens does remain bed-bound, total ADL dependents for bathing, dressing, toileting as she is in comment. Erica Stevens does require assistance with feeding. Staff endorses no recent falls, hospitalizations, wounds, infections. Staff endorses no other changes. I visited and observed Erica. Stevens, appears comfortable, lying in bed, makes eye contact. Erica Stevens does mumble a few words, some clear. Noted weight gain since 10/23. We talked about foods, appetite though limited with cognitive impairment. Erica Stevens was cooperative with assessment. We talked about what brings her joy. Supportive visit. Attempted to contact Erica Stevens, dtg, unable to leave a message, updated staff. Purpose of today PC f/u  visit further discussion monitor trends of appetite, weights, monitor for functional, cognitive decline with chronic disease progression, assess any active  symptoms, supportive role.   History obtained from review of EMR, discussion with facility staff and Erica Stevens.  I reviewed available labs, medications, imaging, studies and related documents from the EMR.  Records reviewed and summarized above.    Physical Exam General: frail appearing, thin, debilitated, cognitively impaired female ENMT: oral mucous membranes moist CV: S1S2, RRR Pulmonary: LCTA Neuro:  + generalized weakness,  + cognitive impairment Psych: flat affect, A and Oriented to self Thank you for the opportunity to participate in the care of Erica Stevens. Please call our office at [phone] if we can be of additional assistance.   Donta Fuster Erica Rome, NP

## 2023-01-18 ENCOUNTER — Non-Acute Institutional Stay: Payer: Medicare Other | Admitting: Nurse Practitioner

## 2023-01-18 ENCOUNTER — Encounter: Payer: Self-pay | Admitting: Nurse Practitioner

## 2023-01-18 VITALS — BP 142/68 | HR 64 | Temp 98.1°F | Resp 18 | Wt 119.0 lb

## 2023-01-18 DIAGNOSIS — R630 Anorexia: Secondary | ICD-10-CM

## 2023-01-18 DIAGNOSIS — I639 Cerebral infarction, unspecified: Secondary | ICD-10-CM

## 2023-01-18 DIAGNOSIS — Z515 Encounter for palliative care: Secondary | ICD-10-CM

## 2023-01-18 DIAGNOSIS — R5381 Other malaise: Secondary | ICD-10-CM

## 2023-01-18 DIAGNOSIS — R634 Abnormal weight loss: Secondary | ICD-10-CM

## 2023-01-18 NOTE — Progress Notes (Signed)
Therapist, nutritional Palliative Care Consult Note Telephone: [phone]  Fax: [phone]    Date of encounter: 01/18/23 5:18 PM PATIENT NAME: Erica Stevens [address] [place] [address]   [phone] (home)  DOB: 07/28/1951 MRN: [id number] PRIMARY CARE PROVIDER:    [facility]  RESPONSIBLE PARTY:    Contact Information     Name Relation Home Work Mobile   Erica Stevens Daughter [phone]     Erica Stevens Mother [phone]          I met face to face with patient in facility. Palliative Care was asked to follow this patient by consultation request of  Erica Stevens to address advance care planning and complex medical decision making. This is a follow up visit.                                  ASSESSMENT AND PLAN / RECOMMENDATIONS:  Symptom Management/Plan: 1. ACP: Full code, ongoing discussion goc   2. Anorexia; continues to improve; significant improved weight gain; Continue supplements, monitoring weights. Encourage to eat. PC supportive role 06/16/2022 weight 107.9 lbs 07/20/2022 weight 112.5 lbs 08/18/2022 weight 113 lbs 09/19/2022 weight 117 lbs 10/20/2023 weight 120.5 lbs 12/14/2022 weight 123 lbs  01/13/2023 weight 119 lbs 4 lbs loss/4 weeks 3. 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 Fall precautions, continue to encourage staff to get oob. Functionally encourage restorative exercises.    4. Palliative care encounter; Palliative medicine team will continue to support patient, patient's family, and medical team. Visit consisted of counseling and education dealing with the complex and emotionally intense issues of symptom management and palliative care in the setting of serious and potentially life-threatening illness   Follow up Palliative Care Visit: PC f/u visit further discussion monitor trends of appetite, weights, monitor for functional,  cognitive decline with chronic disease progression, assess any active symptoms, supportive role. Palliative care will continue to follow for complex medical decision making, advance care planning, and clarification of goals. Return 2 to 8 weeks or prn.   I spent 45 minutes providing this consultation.  More than 50% of the time in this consultation was spent in counseling and care coordination. PPS: 30%   Chief Complaint: Follow up palliative consult for complex medical decision making   HISTORY OF PRESENT ILLNESS:  Erica Stevens is a 72 y.o. year old female  with multiple medical problems including coronary artery disease s/p cabg, chronic systolic congestive heart failure, stroke, hyperlipidemia, dementia, allergies, anxiety, chronic kidney disease, chronic pain syndrome, degeneration of cervical intervertebral disc, hypertension, history of migraine, mitral valve disorder, osteoporosis, hypercholesterolemia, reflux esophagitis, thoracic lumbar neuritis, history of urinary incontinence, depression. Erica Stevens continues to reside in [facility] at Erica [facility]. Erica Stevens does remain bed-bound, total ADL dependents for bathing, dressing, toileting as she is in comment. Erica Stevens does require assistance with feeding. Staff endorses no recent falls, hospitalizations, wounds, infections. Staff endorses no other changes. I visited and observed Erica. Stevens, appears comfortable, lying in bed, makes eye contact. Erica Stevens does mumble a few words, some clear. Limited with cognitive impairment. Erica Stevens was cooperative with assessment. Supportive visit. Attempted to contact Erica Stevens, dtg, unable to leave a message, updated staff. Purpose of today PC f/u visit further discussion monitor trends of appetite, weights, monitor for functional, cognitive  decline with chronic disease progression, assess any active symptoms, supportive role. Medications, poc, ros, goc reviewed.    History obtained from review of EMR, discussion with facility staff and Erica. Stevens.  I reviewed available labs, medications, imaging, studies and related documents from the EMR.  Records reviewed and summarized above.    Physical Exam General: frail appearing, thin, debilitated, cognitively impaired female ENMT: oral mucous membranes moist Neuro:  + generalized weakness,  + cognitive impairment Psych: flat affect, A and Oriented to self Thank you for the opportunity to participate in the care of Erica Stevens. Please call our office at [phone] if we can be of additional assistance.   Erica [place] Prince Rome, NP

## 2023-02-16 ENCOUNTER — Encounter: Payer: Self-pay | Admitting: Nurse Practitioner

## 2023-02-16 ENCOUNTER — Non-Acute Institutional Stay: Payer: Medicare Other | Admitting: Nurse Practitioner

## 2023-02-16 VITALS — BP 136/74 | HR 64 | Temp 98.0°F | Resp 18 | Wt 118.4 lb

## 2023-02-16 DIAGNOSIS — R630 Anorexia: Secondary | ICD-10-CM

## 2023-02-16 DIAGNOSIS — R634 Abnormal weight loss: Secondary | ICD-10-CM

## 2023-02-16 DIAGNOSIS — Z515 Encounter for palliative care: Secondary | ICD-10-CM

## 2023-02-16 DIAGNOSIS — R5381 Other malaise: Secondary | ICD-10-CM

## 2023-02-16 DIAGNOSIS — I639 Cerebral infarction, unspecified: Secondary | ICD-10-CM

## 2023-02-16 NOTE — Progress Notes (Addendum)
Therapist, nutritional Palliative Care Consult Note Telephone: [phone]  Fax: [phone]    Date of encounter: 02/16/23 4:43 PM PATIENT NAME: Erica Stevens [address] [place] [address]   [phone] (home)  DOB: Mar 02, 1951 MRN: [id number] PRIMARY CARE PROVIDER:    [facility]  RESPONSIBLE PARTY:    Contact Information     Name Relation Home Work Mobile   Stevens,Erica Daughter [phone]     Erica Stevens Mother [phone]       I met face to face with patient in facility. Palliative Care was asked to follow this patient by consultation request of  [facility] to address advance care planning and complex medical decision making. This is a follow up visit.                                  ASSESSMENT AND PLAN / RECOMMENDATIONS:  Symptom Management/Plan: 1. ACP: Full code, ongoing discussion goc with daughter, Erica Stevens   2. Anorexia; loss, reviewed weights, continue to weigh; decline continues to be slow. Continue supplements, monitoring weights. Encourage to eat. PC supportive role 09/19/2022 weight 117 lbs 10/20/2023 weight 120.5 lbs 12/14/2022 weight 123 lbs  01/13/2023 weight 119 lbs 02/13/2023 weight 118.4 lbs 3. 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 Fall precautions, continue to encourage staff to get oob. Functionally encourage restorative exercises.    4. Palliative care encounter; Palliative medicine team will continue to support patient, patient's family, and medical team. Visit consisted of counseling and education dealing with the complex and emotionally intense issues of symptom management and palliative care in the setting of serious and potentially life-threatening illness   Follow up Palliative Care Visit: PC f/u visit further discussion monitor trends of appetite, weights, monitor for functional, cognitive decline with chronic disease  progression, assess any active symptoms, supportive role. Palliative care will continue to follow for complex medical decision making, advance care planning, and clarification of goals. Return 2 to 8 weeks or prn.   I spent 47 minutes providing this consultation.  More than 50% of the time in this consultation was spent in counseling and care coordination. PPS: 30%   Chief Complaint: Follow up palliative consult for complex medical decision making   HISTORY OF PRESENT ILLNESS:  Erica Stevens is a 72 y.o. year old female  with multiple medical problems including coronary artery disease s/p cabg, chronic systolic congestive heart failure, stroke, hyperlipidemia, dementia, allergies, anxiety, chronic kidney disease, chronic pain syndrome, degeneration of cervical intervertebral disc, hypertension, history of migraine, mitral valve disorder, osteoporosis, hypercholesterolemia, reflux esophagitis, thoracic lumbar neuritis, history of urinary incontinence, depression. [name] continues to reside in [facility] at [facility]. [name] does remain bed-bound, total ADL dependents for bathing, dressing, toileting with incontinence. Appetite remains declined with weight loss slow decline. Staff endorses no recent falls, hospitalizations, wounds. At present [name] is lying in bed, appears comfortable, no visitors present. [name] did make eye contact, able to answer simple questions though limited with cognitive impairment. Supportive visit, gave [name] her stuffed bear she picked out to have on her bed. [name] was cooperative with assessment. Attempted to contact Erica Stevens her dtg for update, with ongoing slow decline with appetite, chronic disease. Medications, goc, poc provider notes reviewed. PC f/u visit further discussion monitor trends of  appetite, weights, monitor for functional, cognitive decline with chronic disease progression, assess any active  symptoms, supportive role. Medications, poc, ros, goc reviewed.   History obtained from review of EMR, discussion with facility staff and Ms. Scarberry.  I reviewed available labs, medications, imaging, studies and related documents from the EMR.  Records reviewed and summarized above.    Physical Exam General: frail appearing, thin, debilitated, cognitively impaired female ENMT: oral mucous membranes moist Pulm: Breath sounds clear, decreased bases Cardio: RRR, no edema Neuro:  + generalized weakness,  + cognitive impairment Psych: flat affect, A and Oriented to self  Thank you for the opportunity to participate in the care of [name]. Please call our office at [phone] if we can be of additional assistance.   Dierks Wach Prince Rome, NP
